# Patient Record
Sex: Male | Born: 1945 | Race: White | Hispanic: No | Marital: Married | Smoking: Former smoker
Health system: Southern US, Community
[De-identification: ages and names within clinical notes are randomized; demographics above are authoritative.]

## PROBLEM LIST (undated history)

## (undated) DIAGNOSIS — Z87448 Personal history of other diseases of urinary system: Secondary | ICD-10-CM

## (undated) DIAGNOSIS — I10 Essential (primary) hypertension: Secondary | ICD-10-CM

## (undated) DIAGNOSIS — I4819 Other persistent atrial fibrillation: Secondary | ICD-10-CM

## (undated) DIAGNOSIS — N135 Crossing vessel and stricture of ureter without hydronephrosis: Secondary | ICD-10-CM

## (undated) DIAGNOSIS — R3914 Feeling of incomplete bladder emptying: Secondary | ICD-10-CM

## (undated) DIAGNOSIS — K432 Incisional hernia without obstruction or gangrene: Secondary | ICD-10-CM

## (undated) DIAGNOSIS — R011 Cardiac murmur, unspecified: Secondary | ICD-10-CM

## (undated) DIAGNOSIS — H3530 Unspecified macular degeneration: Secondary | ICD-10-CM

## (undated) DIAGNOSIS — Z9289 Personal history of other medical treatment: Secondary | ICD-10-CM

## (undated) DIAGNOSIS — C61 Malignant neoplasm of prostate: Secondary | ICD-10-CM

## (undated) DIAGNOSIS — E7800 Pure hypercholesterolemia, unspecified: Secondary | ICD-10-CM

## (undated) DIAGNOSIS — N200 Calculus of kidney: Secondary | ICD-10-CM

## (undated) DIAGNOSIS — G4730 Sleep apnea, unspecified: Secondary | ICD-10-CM

## (undated) DIAGNOSIS — Z9989 Dependence on other enabling machines and devices: Secondary | ICD-10-CM

## (undated) DIAGNOSIS — I499 Cardiac arrhythmia, unspecified: Secondary | ICD-10-CM

## (undated) DIAGNOSIS — F32A Depression, unspecified: Secondary | ICD-10-CM

## (undated) DIAGNOSIS — R39198 Other difficulties with micturition: Secondary | ICD-10-CM

## (undated) DIAGNOSIS — I4891 Unspecified atrial fibrillation: Secondary | ICD-10-CM

## (undated) DIAGNOSIS — I2510 Atherosclerotic heart disease of native coronary artery without angina pectoris: Secondary | ICD-10-CM

## (undated) DIAGNOSIS — Z7901 Long term (current) use of anticoagulants: Secondary | ICD-10-CM

## (undated) DIAGNOSIS — R002 Palpitations: Secondary | ICD-10-CM

## (undated) DIAGNOSIS — F329 Major depressive disorder, single episode, unspecified: Secondary | ICD-10-CM

## (undated) DIAGNOSIS — R0609 Other forms of dyspnea: Secondary | ICD-10-CM

## (undated) DIAGNOSIS — K08109 Complete loss of teeth, unspecified cause, unspecified class: Secondary | ICD-10-CM

## (undated) DIAGNOSIS — C801 Malignant (primary) neoplasm, unspecified: Secondary | ICD-10-CM

## (undated) DIAGNOSIS — M1990 Unspecified osteoarthritis, unspecified site: Secondary | ICD-10-CM

## (undated) DIAGNOSIS — R0600 Dyspnea, unspecified: Secondary | ICD-10-CM

## (undated) DIAGNOSIS — E119 Type 2 diabetes mellitus without complications: Secondary | ICD-10-CM

## (undated) DIAGNOSIS — G4733 Obstructive sleep apnea (adult) (pediatric): Secondary | ICD-10-CM

## (undated) DIAGNOSIS — Z974 Presence of external hearing-aid: Secondary | ICD-10-CM

## (undated) HISTORY — DX: Obstructive sleep apnea (adult) (pediatric): Z99.89

## (undated) HISTORY — DX: Malignant neoplasm of prostate: C61

## (undated) HISTORY — PX: JOINT REPLACEMENT: SHX530

## (undated) HISTORY — PX: ROTATOR CUFF REPAIR: SHX139

## (undated) HISTORY — DX: Personal history of other medical treatment: Z92.89

## (undated) HISTORY — DX: Unspecified atrial fibrillation: I48.91

## (undated) HISTORY — PX: OTHER SURGICAL HISTORY: SHX169

## (undated) HISTORY — DX: Pure hypercholesterolemia, unspecified: E78.00

## (undated) HISTORY — DX: Unspecified osteoarthritis, unspecified site: M19.90

## (undated) HISTORY — DX: Unspecified macular degeneration: H35.30

## (undated) HISTORY — DX: Cardiac arrhythmia, unspecified: I49.9

## (undated) HISTORY — DX: Obstructive sleep apnea (adult) (pediatric): G47.33

## (undated) HISTORY — DX: Palpitations: R00.2

---

## 1996-05-15 DIAGNOSIS — Z8546 Personal history of malignant neoplasm of prostate: Secondary | ICD-10-CM

## 1996-05-15 HISTORY — PX: OTHER SURGICAL HISTORY: SHX169

## 1996-05-15 HISTORY — DX: Personal history of malignant neoplasm of prostate: Z85.46

## 2000-04-04 ENCOUNTER — Encounter: Payer: Self-pay | Admitting: Specialist

## 2000-04-04 ENCOUNTER — Ambulatory Visit (HOSPITAL_COMMUNITY): Admission: RE | Admit: 2000-04-04 | Discharge: 2000-04-04 | Payer: Self-pay | Admitting: Specialist

## 2001-01-02 ENCOUNTER — Encounter (INDEPENDENT_AMBULATORY_CARE_PROVIDER_SITE_OTHER): Payer: Self-pay | Admitting: Specialist

## 2001-01-02 ENCOUNTER — Ambulatory Visit (HOSPITAL_COMMUNITY): Admission: RE | Admit: 2001-01-02 | Discharge: 2001-01-02 | Payer: Self-pay | Admitting: Gastroenterology

## 2005-05-15 HISTORY — PX: COLONOSCOPY: SHX174

## 2005-09-26 ENCOUNTER — Emergency Department (HOSPITAL_COMMUNITY): Admission: EM | Admit: 2005-09-26 | Discharge: 2005-09-26 | Payer: Self-pay | Admitting: Emergency Medicine

## 2006-11-16 ENCOUNTER — Emergency Department (HOSPITAL_COMMUNITY): Admission: EM | Admit: 2006-11-16 | Discharge: 2006-11-16 | Payer: Self-pay | Admitting: Emergency Medicine

## 2006-12-03 ENCOUNTER — Ambulatory Visit (HOSPITAL_BASED_OUTPATIENT_CLINIC_OR_DEPARTMENT_OTHER): Admission: RE | Admit: 2006-12-03 | Discharge: 2006-12-03 | Payer: Self-pay | Admitting: Urology

## 2007-09-25 ENCOUNTER — Ambulatory Visit (HOSPITAL_BASED_OUTPATIENT_CLINIC_OR_DEPARTMENT_OTHER): Admission: RE | Admit: 2007-09-25 | Discharge: 2007-09-25 | Payer: Self-pay | Admitting: Urology

## 2008-04-30 ENCOUNTER — Encounter: Admission: RE | Admit: 2008-04-30 | Discharge: 2008-04-30 | Payer: Self-pay | Admitting: Internal Medicine

## 2008-07-16 ENCOUNTER — Encounter: Admission: RE | Admit: 2008-07-16 | Discharge: 2008-07-16 | Payer: Self-pay | Admitting: Internal Medicine

## 2008-09-16 ENCOUNTER — Ambulatory Visit (HOSPITAL_COMMUNITY): Admission: RE | Admit: 2008-09-16 | Discharge: 2008-09-16 | Payer: Self-pay | Admitting: Cardiology

## 2010-03-17 ENCOUNTER — Ambulatory Visit: Payer: Self-pay | Admitting: Cardiology

## 2010-08-23 LAB — GLUCOSE, CAPILLARY: Glucose-Capillary: 113 mg/dL — ABNORMAL HIGH (ref 70–99)

## 2010-09-19 ENCOUNTER — Encounter: Payer: Self-pay | Admitting: *Deleted

## 2010-09-23 ENCOUNTER — Encounter: Payer: Self-pay | Admitting: Cardiology

## 2010-09-23 ENCOUNTER — Ambulatory Visit (INDEPENDENT_AMBULATORY_CARE_PROVIDER_SITE_OTHER): Payer: Medicare Other | Admitting: Cardiology

## 2010-09-23 VITALS — BP 100/78 | HR 72 | Ht 72.0 in | Wt 213.6 lb

## 2010-09-23 DIAGNOSIS — C61 Malignant neoplasm of prostate: Secondary | ICD-10-CM | POA: Insufficient documentation

## 2010-09-23 DIAGNOSIS — I4891 Unspecified atrial fibrillation: Secondary | ICD-10-CM | POA: Insufficient documentation

## 2010-09-23 DIAGNOSIS — E7800 Pure hypercholesterolemia, unspecified: Secondary | ICD-10-CM | POA: Insufficient documentation

## 2010-09-23 DIAGNOSIS — R002 Palpitations: Secondary | ICD-10-CM | POA: Insufficient documentation

## 2010-09-23 NOTE — Progress Notes (Signed)
Subjective:   [name] comes in today for followup of his atrial fibrillation he is on chronic warfarin and his INR is 2.3. In general, he's been feeling well but will get occasional shortness of breath if he overexerts. We attempted cardioversion without any long-term success. Other underlying issues include hypertension and hyperlipemia. He has a history of obesity with significant weight loss. His wife died one year ago.  Current Outpatient Prescriptions  Medication Sig Dispense Refill  . ALLOPURINOL PO Take by mouth daily.        Marland Kitchen atenolol (TENORMIN) 50 MG tablet Take 25 mg by mouth daily.        Marland Kitchen atorvastatin (LIPITOR) 10 MG tablet Take 10 mg by mouth daily.        . BuPROPion HCl (WELLBUTRIN PO) Take by mouth daily.        Marland Kitchen LISINOPRIL PO Take 2.5 mg by mouth daily.       . metFORMIN (GLUCOPHAGE) 500 MG tablet Take 500 mg by mouth.        . Multiple Vitamin (MULTIVITAMIN PO) Take by mouth daily.        . TRAMADOL HCL PO Take by mouth.        . warfarin (COUMADIN) 6 MG tablet Take 6 mg by mouth daily.        Marland Kitchen DISCONTD: TRAMADOL HCL PO Take by mouth 2 (two) times daily.          Allergies  Allergen Reactions  . Sulfa Drugs Cross Reactors   . Tetracyclines & Related     Patient Active Problem List  Diagnoses  . Arrhythmia  . Prostate cancer  . Palpitations  . Diabetes mellitus  . Hypercholesteremia    History  Smoking status  . Former Smoker  . Quit date: 05/15/1973  Smokeless tobacco  . Not on file    History  Alcohol Use     Family History  Problem Relation Age of Onset  . Cancer Mother   . Heart attack Sister   . Stroke Sister     Review of Systems:   The patient denies any heat or cold intolerance.  No weight gain or weight loss.  The patient denies headaches or blurry vision.  There is no cough or sputum production.  The patient denies dizziness.  There is no hematuria or hematochezia.  The patient denies any muscle aches or arthritis.  The patient denies  any rash.  The patient denies frequent falling or instability.  There is no history of depression or anxiety.  All other systems were reviewed and are negative.   Physical Exam:   Blood pressure is 100/78. Heart rate 72. Weight is 213 pounds.The head is normocephalic and atraumatic.  Pupils are equally round and reactive to light.  Sclerae nonicteric.  Conjunctiva is clear.  Oropharynx is unremarkable.  There's adequate oral airway.  Neck is supple there are no masses.  Thyroid is not enlarged.  There is no lymphadenopathy.  Lungs are clear.  Chest is symmetric.  Heart shows a irregular rate and rhythm.  S1 and S2 are normal.  There is no murmur click or gallop.  Abdomen is soft normal bowel sounds.  There is no organomegaly.  Genital and rectal deferred.  Extremities are without edema.  Peripheral pulses are adequate.  Neurologically intact.  Full range of motion.  The patient is not depressed.  Skin is warm and dry. Assessment / Plan:

## 2010-09-23 NOTE — Assessment & Plan Note (Signed)
In general, he continues to do well. We'll continue to treat him with rate control with atenolol and anticoagulation with warfarin. He has hypertension and diabetes and needs to stay on long-term anticoagulation.

## 2010-09-27 NOTE — H&P (Signed)
NAMERICARD, FAULKNER                  ACCOUNT NO.:  [id number]   MEDICAL RECORD NO.:  [id number]          PATIENT TYPE:   LOCATION:                                 FACILITY:   PHYSICIAN:  Colleen Can. Deborah Chalk, M.D.DATE OF BIRTH:  June 29, 1945   DATE OF ADMISSION:  09/15/2008  DATE OF DISCHARGE:                              HISTORY & PHYSICAL   CHIEF COMPLAINT:  None.   HISTORY OF PRESENT ILLNESS:  [name] is a very pleasant 65 year old white  male who presents for attempts at cardioversion.  He has had atrial  fibrillation and has been maintained on chronic Coumadin  anticoagulation.  The exact duration of his arrhythmia is unknown.  He  denies symptoms or palpitations.  He does not have shortness of breath.  He denies being lightheaded or dizzy and he has not had syncope.  He has  had a stress echocardiogram which was normal.  His 2-D echocardiogram  showed mild LVH with mild biatrial enlargement.  There was mild-to-  moderate mitral and tricuspid regurgitation.  His Coumadin levels have  been therapeutic and he now presents for attempts at cardioversion.   PAST MEDICAL HISTORY:  1. Atrial fibrillation of uncertain duration.  2. Chronic Coumadin anticoagulation.  3. Hypertension x10 years.  4. Adult onset diabetes.  5. Hyperlipidemia.  6. History of prostate cancer with seed implantation.  7. Previous history of fractures.   ALLERGIES:  SULFA and TETRACYCLINE.   CURRENT MEDICATIONS:  1. Atenolol 50 mg taking a half tablet daily.  2. Metformin 500 mg a day.  3. Allopurinol daily.  4. Lisinopril daily.  5. Lipitor daily.  6. Tramadol p.r.n.  7. Coumadin taking 4 mg 5 days a week and 6 mg 2 days a week.   FAMILY HISTORY:  Father died at 15 with a heart attack and stroke.  Mother died at age 4 of cancer.  He had one sister who died at the age  of 38.   SOCIAL HISTORY:  He lives at home with his spouse.  He has two sons.  He  enjoys playing music.  He is semi-retired Music therapist.   He will have 5-6  beers on a Wednesday night which is music night, but otherwise has very  minimal alcohol intake and he stopped smoking in 1975.   REVIEW OF SYSTEMS:  Basically as noted above.  He denies having chest  pain or shortness of breath.  He has not been lightheaded or dizzy.  He  has had no complaints with energy level.  All other review of systems  are negative.   PHYSICAL EXAMINATION:  GENERAL:  Today, he is very pleasant white male  who is in no acute distress.  He is very pleasant, conversive, and  follows commands.  VITAL SIGNS:  Blood pressure is 100/70, heart rate 62 and irregular, his  weight is 210 pounds.  SKIN: Warm and dry. COLOR: Normal.  HEENT:  He is normocephalic and atraumatic.  Pupils are equal and  reactive.  Sclerae are nonicteric.  Conjunctivae are normal.  NECK:  Supple.  No masses.  No bruits.  LUNGS:  Clear to auscultate.  CARDIAC:  An irregularly irregular rhythm with a controlled rate.  ABDOMEN:  Soft, positive bowel sounds, and nontender.  EXTREMITIES:  Without edema.  MUSCULOSKELETAL:  Gait and range of motion to be intact.  His strength  is symmetric.  NEUROLOGIC:  No gross focal deficits.   PERTINENT LABORATORY DATA:  Pending.   OVERALL IMPRESSION:  1. Atrial fibrillation of uncertain duration.  2. Chronic Coumadin anticoagulation.  3. Hypertension.  4. Diabetes.  5. Hyperlipidemia.   PLAN:  We will proceed on with attempts at cardioversion.  The procedure  has been reviewed in full detail and he is willing to proceed on  Wednesday Sep 15, 2008.      Sharlee Blew, N.P.      Colleen Can. Deborah Chalk, M.D.  Electronically Signed    LC/MEDQ  D:  09/14/2008  T:  09/15/2008  Job:  [id number]

## 2010-09-27 NOTE — Cardiovascular Report (Signed)
[name], [name]                  ACCOUNT NO.:  [id number]   MEDICAL RECORD NO.:  [id number]          PATIENT TYPE:  OIB   LOCATION:  2899                         FACILITY:  [facility]   PHYSICIAN:  Colleen Can. Deborah Chalk, M.D.DATE OF BIRTH:  Dec 31, 1945   DATE OF PROCEDURE:  09/16/2008  DATE OF DISCHARGE:  09/16/2008                            CARDIAC CATHETERIZATION   PROCEDURE:  Cardioversion.   PROCEDURE:  Using anterior posterior pads, 120 joules of biphasic energy  were delivered in a synchronized fashion with conversion to normal sinus  rhythm.  The patient tolerated the procedure well.  Anesthesia was with  Dr. Autumn Patty with 150 mg of IV propofol.  The patient tolerated  the procedure well.      Colleen Can. Deborah Chalk, M.D.  Electronically Signed     SNT/MEDQ  D:  09/16/2008  T:  09/16/2008  Job:  [id number]

## 2010-09-27 NOTE — Op Note (Signed)
[name], [name]                  ACCOUNT NO.:  [id number]   MEDICAL RECORD NO.:  [id number]         PATIENT TYPE:  LINP   LOCATION:                               FACILITY:  [facility]   PHYSICIAN:  Maretta Bees. Vonita Moss, M.D.DATE OF BIRTH:  [birth year]   DATE OF PROCEDURE:  12/03/2006  DATE OF DISCHARGE:                               OPERATIVE REPORT   [facility]   PREOPERATIVE DIAGNOSIS:  Urethral stricture and slow stream.   POSTOP DIAGNOSIS.:  Urethral stricture and slow stream.   PROCEDURE:  Cystoscopy and urethral dilation with filiforms and  followers.   SURGEON:  Maretta Bees. Vonita Moss, M.D.   ANESTHESIA:  General.   INDICATIONS:  This 65 year old gentleman underwent radioactive seed  implantation over 10 years ago.  His stream has been slow lately.  This  PSAs have been very low.  A flow rate was performed that showed peak of  7 and a mean of 3 mL a second.  He did empty well on ultrasound.  He was  cystoscoped in the office and he was found to have a bulbous urethral  stricture; and he was found to have a urethral stricture.  He was  brought to the OR today for further treatment and evaluation.   PROCEDURE:  The patient was brought to the operating room, and placed in  the lithotomy position and external genitalia were prepped and draped in  the usual fashion.  He was cystoscoped; the anterior urethra was normal.  He had a tight deep bulbous urethral stricture just outside the  sphincter.  I inserted a guidewire with the aid of the cystoscope and  then dilated the stricture to 26-French with Heyman dilators.  I then  cystoscoped him and external sphincter and prostatic urethra were  unremarkable with no significant obstruction.  The bladder was  completely normal.  The bladder was emptied, the scope removed; and the  patient sent to the recovery room in good condition having tolerated the  procedure well.      Maretta Bees. Vonita Moss, M.D.  Electronically Signed     LJP/MEDQ  D:  12/03/2006  T:  12/03/2006  Job:  [id number]

## 2010-09-27 NOTE — Op Note (Signed)
[name], [name]                  ACCOUNT NO.:  [id number]   MEDICAL RECORD NO.:  [id number]          PATIENT TYPE:  AMB   LOCATION:  [facility]                         FACILITY:  [facility]   PHYSICIAN:  Maretta Bees. Vonita Moss, M.D.DATE OF BIRTH:  1946/04/22   DATE OF PROCEDURE:  09/25/2007  DATE OF DISCHARGE:                               OPERATIVE REPORT   PREOPERATIVE DIAGNOSIS:  Deep bulbous urethral stricture.   POSTOPERATIVE DIAGNOSES:  Deep bulbous urethral stricture.   PROCEDURE:  Cystoscopy and laser incision of urethral stricture.   SURGEON:  Dr. Larey Dresser   ANESTHESIA:  General.   INDICATIONS:  This gentleman had radioactive seed implantation in 1998  and has had a good curative result but he has had problems with a  urethral stricture just beyond the sphincter and he did require cysto  and urethral dilation last July.  He did better for a while but his  stream has slowed down significantly.  He was counseled about laser  incision of the stricture and postoperative self catheter which he has  learned at [facility].  He was advised about the risk of  recurrent stricture disease, infection and incontinence.   PROCEDURE:  The patient brought to the operating room, placed in  lithotomy position.  External genitalia were prepped and draped in the  usual fashion.  He was cystoscoped and a deep bulbous urethral stricture  was identified through which a glide wire passed easily.  With the  guidewire in place and using a 200 micron holmium laser fiber, the  stricture was incised at 12 o'clock and then at 6 o'clock with care to  not go beyond the strictured area and the sphincter region.  After the  incisions were made, the cystoscope passed easily into through the  stricture and through the prostatic urethra which was short and  nonobstructing.  The bladder had trabeculation but no other lesions.  At  this point the bladder was emptied, the scope and Glidewire and laser  catheter were removed.  He was sent to recovery room in good condition  having tolerated the procedure well.      Maretta Bees. Vonita Moss, M.D.  Electronically Signed     LJP/MEDQ  D:  09/25/2007  T:  09/25/2007  Job:  [id number]

## 2010-09-30 NOTE — Procedures (Signed)
[facility]  Patient:    [name], [name] Visit Number: [id number] MRN: [id number]          Service Type: Attending:  Verlin Grills, M.D. Proc. Date: 01/02/01   CC:         Wayne C. Dorna Bloom, M.D.   Procedure Report  PROCEDURE:  Colonoscopy with polypectomy.  INDICATION:  Mr. [name] (date of birth 05/23/2045) is a 65 year old male, who is due for his surveillance colonoscopy and polypectomy to prevent colon cancer.  His mother died of colon cancer when she was in her 27s.  [name] tells me he has had colon polyps removed with his last colonoscopy.  ENDOSCOPIST:  Verlin Grills, M.D.  PREMEDICATION:  Fentanyl 50 mcg, Versed 10 mg.  ENDOSCOPE:  Olympus pediatric colonoscope.  DESCRIPTION OF PROCEDURE:  After obtaining informed consent, [name] was placed in the left lateral decubitus position.  I administered intravenous fentanyl and intravenous Versed to achieve conscious sedation for the procedure.  The patients blood pressure, oxygen saturations, and cardiac rhythm were monitored throughout the procedure and documented in the medical record.  Anal inspection was normal.  Digital rectal exam revealed a nonnodular prostate.  The Olympus pediatric video colonoscope was introduced into the rectum and easily advanced to the cecum.  Colonic preparation for the exam today was excellent.  RECTUM:  Normal.  SIGMOID COLON AND DESCENDING COLON:  From the distal descending colon, at 55 cm from the anal verge, a 1 mm sessile polyp was removed with the hot biopsy forceps and submitted for pathological interpretation.  SPLENIC FLEXURE:  Normal.  TRANSVERSE COLON:  Normal.  HEPATIC FLEXURE:  Normal.  ASCENDING COLON:  Normal.  CECUM AND ILEOCECAL VALVE:  Normal.  ASSESSMENT:  A 1 mm sessile polyps was removed with the hot biopsy forceps from the distal descending colon at 55 cm from the anal verge and submitted for pathological  interpretation.  Otherwise, normal proctocolonoscopy to the cecum.  RECOMMENDATIONS:  Repeat colonoscopy in approximately five years. Attending:  Verlin Grills, M.D. DD:  01/02/01 TD:  01/02/01 Job: [id number]

## 2011-02-27 LAB — BASIC METABOLIC PANEL
BUN: 20
CO2: 29
Calcium: 9.2
Chloride: 103
Creatinine, Ser: 1.28
GFR calc Af Amer: >60
GFR calc non Af Amer: 57 — ABNORMAL LOW
Glucose, Bld: 84
Potassium: 4.2
Sodium: 140

## 2011-02-27 LAB — POCT HEMOGLOBIN-HEMACUE
Hemoglobin: 14.2
Operator id: 268271

## 2011-02-28 LAB — PROTIME-INR
INR: 1
Prothrombin Time: 13.2

## 2011-02-28 LAB — POCT CARDIAC MARKERS
CKMB, poc: 1.9
CKMB, poc: 2.7
Myoglobin, poc: 104
Myoglobin, poc: 120
Operator id: 1211
Operator id: 4661
Troponin i, poc: <0.05
Troponin i, poc: <0.05

## 2011-02-28 LAB — BASIC METABOLIC PANEL
BUN: 22
CO2: 24
Calcium: 8.9
Chloride: 98
Creatinine, Ser: 1.08
GFR calc Af Amer: >60
GFR calc non Af Amer: >60
Glucose, Bld: 123 — ABNORMAL HIGH
Potassium: 3.2 — ABNORMAL LOW
Sodium: 133 — ABNORMAL LOW

## 2011-04-03 ENCOUNTER — Ambulatory Visit (INDEPENDENT_AMBULATORY_CARE_PROVIDER_SITE_OTHER): Payer: Medicare Other | Admitting: Cardiology

## 2011-04-03 ENCOUNTER — Encounter: Payer: Self-pay | Admitting: Cardiology

## 2011-04-03 VITALS — BP 134/84 | HR 60

## 2011-04-03 DIAGNOSIS — E7800 Pure hypercholesterolemia, unspecified: Secondary | ICD-10-CM

## 2011-04-03 DIAGNOSIS — I4891 Unspecified atrial fibrillation: Secondary | ICD-10-CM

## 2011-04-03 NOTE — Patient Instructions (Addendum)
Your physician recommends that you have  lab work on Friday November 23,2012--CBC/BMET.  The lab is open from  8:30am-4:30pm , closed 1:30pm-2:30pm for lunch.  Your physician wants you to follow-up in: 6 months with Dr Shirlee Latch. (May 2013). You will receive a reminder letter in the mail two months in advance. If you don't receive a letter, please call our office to schedule the follow-up appointment.

## 2011-04-04 NOTE — Assessment & Plan Note (Signed)
I will call his PCP's office to get most recent lipids.

## 2011-04-04 NOTE — Progress Notes (Signed)
PCP: Dr. Eloise Harman  65 yo with history of chronic atrial fibrillation that has recurred after multiple cardioversions presents for cardiology followup.  He has been seen by Dr. Deborah Chalk in the past and is seen by me for the first time today.  He has been on warfarin long-term but asks about alternative agents as he sometimes forgets to get his INR checked.  He does well symptomatically and is still working as a Nutritional therapist.  No chest pain.  Mild shortness of breath when carrying a heavy load.  He can climb ladders and stairs without problems.  No history of GI bleeding.    ECG: atrial fibrillation, PVCs, anteroseptal Qs  PMH: 1. Chronic atrial fibrillation: Recurred repetitively after DCCV. He is on warfarin.  2. Gout 3. OSA on CPAP. 4. HTN 5. Hyperlipidemia 6. Obesity 7. H/o prostate cancer  SH: Widower.  Quit tobacco in 1975.  Works as Surveyor, minerals for remodeling jobs.    FH: Sister with MI and CVA.  Father with MI at 76.   ROS: All systems reviewed and negative except as per HPI.   Current Outpatient Prescriptions  Medication Sig Dispense Refill  . ALLOPURINOL PO Take by mouth daily.        Marland Kitchen atenolol (TENORMIN) 50 MG tablet Take 25 mg by mouth daily.        Marland Kitchen atorvastatin (LIPITOR) 10 MG tablet Take 10 mg by mouth daily.        . BuPROPion HCl (WELLBUTRIN PO) Take by mouth daily.        Marland Kitchen LISINOPRIL PO Take 2.5 mg by mouth daily.       . metFORMIN (GLUCOPHAGE) 500 MG tablet Take 500 mg by mouth.        . Multiple Vitamin (MULTIVITAMIN PO) Take by mouth daily.        . TRAMADOL HCL PO Take by mouth.        . warfarin (COUMADIN) 6 MG tablet Take 6 mg by mouth daily.          BP 134/84  Pulse 60 General: NAD Neck: No JVD, no thyromegaly or thyroid nodule.  Lungs: Clear to auscultation bilaterally with normal respiratory effort. CV: Nondisplaced PMI.  Heart irregular S1/S2, no S3/S4, no murmur.  No peripheral edema.  No carotid bruit.  Normal pedal pulses.  Abdomen:  Soft, nontender, no hepatosplenomegaly, no distention.  Neurologic: Alert and oriented x 3.  Psych: Normal affect. Extremities: No clubbing or cyanosis.

## 2011-04-04 NOTE — Assessment & Plan Note (Addendum)
Chronic atrial fibrillation.  Good rate control on atenolol.  He is minimally symptomatic and atrial fibrillation has recurred after each cardioversion, so I do not think that it would be worthwhile to try to cardiovert him again.  He sometimes forgets to get his INR drawn and was interested in an alternative agent to coumadin.  I will check a BMET and CBC today.  If creatinine is ok, I will start him on once daily Xarelto.  As long as INR is </= 2.5, he can stop coumadin and start Xarelto the next day.

## 2011-04-07 ENCOUNTER — Other Ambulatory Visit (INDEPENDENT_AMBULATORY_CARE_PROVIDER_SITE_OTHER): Payer: Medicare Other | Admitting: *Deleted

## 2011-04-07 DIAGNOSIS — I4891 Unspecified atrial fibrillation: Secondary | ICD-10-CM

## 2011-04-07 LAB — CBC WITH DIFFERENTIAL/PLATELET
Basophils Absolute: 0 10*3/uL (ref 0.0–0.1)
Basophils Relative: 0.3 % (ref 0.0–3.0)
Eosinophils Absolute: 0.1 10*3/uL (ref 0.0–0.7)
Eosinophils Relative: 2 % (ref 0.0–5.0)
HCT: 42 % (ref 39.0–52.0)
Hemoglobin: 14.4 g/dL (ref 13.0–17.0)
Lymphocytes Relative: 23.7 % (ref 12.0–46.0)
Lymphs Abs: 1.3 10*3/uL (ref 0.7–4.0)
MCHC: 34.3 g/dL (ref 30.0–36.0)
MCV: 92.9 fl (ref 78.0–100.0)
Monocytes Absolute: 0.4 10*3/uL (ref 0.1–1.0)
Monocytes Relative: 7.9 % (ref 3.0–12.0)
Neutro Abs: 3.5 10*3/uL (ref 1.4–7.7)
Neutrophils Relative %: 66.1 % (ref 43.0–77.0)
Platelets: 127 10*3/uL — ABNORMAL LOW (ref 150.0–400.0)
RBC: 4.52 Mil/uL (ref 4.22–5.81)
RDW: 13.3 % (ref 11.5–14.6)
WBC: 5.3 10*3/uL (ref 4.5–10.5)

## 2011-04-07 LAB — BASIC METABOLIC PANEL
BUN: 21 mg/dL (ref 6–23)
CO2: 27 mEq/L (ref 19–32)
Calcium: 9 mg/dL (ref 8.4–10.5)
Chloride: 105 mEq/L (ref 96–112)
Creatinine, Ser: 1.4 mg/dL (ref 0.4–1.5)
GFR: 55.51 mL/min — ABNORMAL LOW (ref >60.00)
Glucose, Bld: 127 mg/dL — ABNORMAL HIGH (ref 70–99)
Potassium: 3.9 mEq/L (ref 3.5–5.1)
Sodium: 140 mEq/L (ref 135–145)

## 2011-04-10 ENCOUNTER — Telehealth: Payer: Self-pay | Admitting: *Deleted

## 2011-04-10 DIAGNOSIS — I4891 Unspecified atrial fibrillation: Secondary | ICD-10-CM

## 2011-04-10 MED ORDER — RIVAROXABAN 20 MG PO TABS: 20.0000 mg | ORAL_TABLET | Freq: Every day | ORAL | Status: DC

## 2011-04-10 NOTE — Telephone Encounter (Signed)
Notes Recorded by Jacqlyn Krauss, RN on 04/10/2011 at 3:15 PM I talked with pt. Pt states last INR 04/07/11 was 2.2. Pt will stop coumadin today and start Xarelto 20mg  daily tomorrow. Pt is aware he needs to return for a BMET and CBC 1 month after starting Xarelto. ------  Notes Recorded by Marca Ancona, MD on 04/08/2011 at 9:37 AM If INR < 3, can stop coumadin and start Xarelto 20 mg daily the next day. Needs to get BMET and CBC in 1 month.

## 2011-04-10 NOTE — Telephone Encounter (Signed)
Addended by: Jacqlyn Krauss on: 04/10/2011 03:57 PM   Modules accepted: Orders

## 2011-04-10 NOTE — Telephone Encounter (Signed)
Pt has decided not to stop coumadin and start Xarelto at the present time. (His co-pay will be $45 ).

## 2011-08-01 ENCOUNTER — Encounter (INDEPENDENT_AMBULATORY_CARE_PROVIDER_SITE_OTHER): Payer: Medicare Other | Admitting: Ophthalmology

## 2011-08-01 DIAGNOSIS — H3530 Unspecified macular degeneration: Secondary | ICD-10-CM

## 2011-08-01 DIAGNOSIS — E1139 Type 2 diabetes mellitus with other diabetic ophthalmic complication: Secondary | ICD-10-CM

## 2011-08-01 DIAGNOSIS — H2510 Age-related nuclear cataract, unspecified eye: Secondary | ICD-10-CM

## 2011-08-01 DIAGNOSIS — E11319 Type 2 diabetes mellitus with unspecified diabetic retinopathy without macular edema: Secondary | ICD-10-CM

## 2011-08-01 DIAGNOSIS — H43819 Vitreous degeneration, unspecified eye: Secondary | ICD-10-CM

## 2011-10-02 ENCOUNTER — Ambulatory Visit (INDEPENDENT_AMBULATORY_CARE_PROVIDER_SITE_OTHER): Payer: Medicare Other | Admitting: Cardiology

## 2011-10-02 ENCOUNTER — Encounter: Payer: Self-pay | Admitting: Cardiology

## 2011-10-02 VITALS — BP 110/80 | HR 52 | Resp 18 | Ht 71.0 in | Wt 225.1 lb

## 2011-10-02 DIAGNOSIS — I4891 Unspecified atrial fibrillation: Secondary | ICD-10-CM

## 2011-10-02 DIAGNOSIS — R0600 Dyspnea, unspecified: Secondary | ICD-10-CM

## 2011-10-02 DIAGNOSIS — E7800 Pure hypercholesterolemia, unspecified: Secondary | ICD-10-CM

## 2011-10-02 NOTE — Assessment & Plan Note (Signed)
Lipids done by PCP.  He will ask that a copy be faxed here when he gets lipids again.

## 2011-10-02 NOTE — Progress Notes (Signed)
PCP: Dr. Eloise Harman  66 yo with history of chronic atrial fibrillation that has recurred after multiple cardioversions presents for cardiology followup.  At last appointment, we talked about switching to Xarelto to avoid INR checks.  I gave him a prescription but he discovered that it was more cost effective for him to stay on warfarin so he remains on warfarin.  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  For the last 5-6 months, he has noted new exertional dyspnea when walking up a flight of steps.  He will feel his heart pounding with some pressure at the top of the steps.  No overt chest pain.  He does not have problems walking on flat ground.  He remains in atrial fibrillation with HR in the 50s today.   ECG: atrial fibrillation, anteroseptal Qs (old)  PMH: 1. Chronic atrial fibrillation: Recurred repetitively after DCCV. He is on warfarin.  2. Gout 3. OSA on CPAP. 4. HTN 5. Hyperlipidemia 6. Obesity 7. H/o prostate cancer  SH: Widower.  Quit tobacco in 1975.  Works as Surveyor, minerals for remodeling jobs.    FH: Sister with MI and CVA.  Father with MI at 76.   ROS: All systems reviewed and negative except as per HPI.   Current Outpatient Prescriptions  Medication Sig Dispense Refill  . ALLOPURINOL PO Take by mouth daily.        Marland Kitchen atenolol (TENORMIN) 50 MG tablet Take 25 mg by mouth daily.        Marland Kitchen atorvastatin (LIPITOR) 10 MG tablet Take 10 mg by mouth daily.        . BuPROPion HCl (WELLBUTRIN PO) Take by mouth daily.        Marland Kitchen LISINOPRIL PO Take 2.5 mg by mouth daily.       . metFORMIN (GLUCOPHAGE) 500 MG tablet Take 500 mg by mouth.        . Multiple Vitamin (MULTIVITAMIN PO) Take by mouth daily.        . TRAMADOL HCL PO Take by mouth.        . warfarin (COUMADIN) 6 MG tablet Take 1 tablet (6 mg total) by mouth daily.        BP 110/80  Pulse 52  Resp 18  Ht 5\' 11"  (1.803 m)  Wt 225 lb 1.9 oz (102.114 kg)  BMI 31.40 kg/m2 General: NAD Neck: No JVD, no thyromegaly  or thyroid nodule.  Lungs: Clear to auscultation bilaterally with normal respiratory effort. CV: Nondisplaced PMI.  Heart irregular S1/S2, no S3/S4, no murmur.  No peripheral edema.  No carotid bruit.  Normal pedal pulses.  Abdomen: Soft, nontender, no hepatosplenomegaly, no distention.  Neurologic: Alert and oriented x 3.  Psych: Normal affect. Extremities: No clubbing or cyanosis.

## 2011-10-02 NOTE — Assessment & Plan Note (Signed)
Stable on warfarin and atenolol.  Will continue this regimen.  More cost effective for him to remain on warfarin than to take one of the newer anticoagulants .

## 2011-10-02 NOTE — Patient Instructions (Signed)
Your physician has requested that you have a lexiscan myoview. For further information please visit [email or web address] Please follow instruction sheet, as given.  Your physician has requested that you have an echocardiogram. Echocardiography is a painless test that uses sound waves to create images of your heart. It provides your doctor with information about the size and shape of your heart and how well your heart's chambers and valves are working. This procedure takes approximately one hour. There are no restrictions for this procedure.   Your physician wants you to follow-up in: 6 months with Dr. Shirlee Latch.  You will receive a reminder letter in the mail two months in advance. If you don't receive a letter, please call our office to schedule the follow-up appointment.

## 2011-10-02 NOTE — Assessment & Plan Note (Addendum)
New exertional dyspnea with some palpitations and pressure in his chest when walking up a flight of steps.  He has risk factors for CAD (hyperlipidemia, family history, HTN).  He is not volume overloaded on exam.  I will get a Lexiscan myoview (knee problems walking on inclined treadmill) and an echocardiogram to evaluate.  If studies are normal, would like him to increase exercise.

## 2011-10-03 ENCOUNTER — Ambulatory Visit (HOSPITAL_COMMUNITY): Payer: Medicare Other | Attending: Cardiology

## 2011-10-03 ENCOUNTER — Other Ambulatory Visit: Payer: Self-pay

## 2011-10-03 DIAGNOSIS — I079 Rheumatic tricuspid valve disease, unspecified: Secondary | ICD-10-CM | POA: Insufficient documentation

## 2011-10-03 DIAGNOSIS — R0600 Dyspnea, unspecified: Secondary | ICD-10-CM

## 2011-10-03 DIAGNOSIS — R0602 Shortness of breath: Secondary | ICD-10-CM

## 2011-10-03 DIAGNOSIS — I379 Nonrheumatic pulmonary valve disorder, unspecified: Secondary | ICD-10-CM | POA: Insufficient documentation

## 2011-10-03 DIAGNOSIS — I10 Essential (primary) hypertension: Secondary | ICD-10-CM | POA: Insufficient documentation

## 2011-10-03 DIAGNOSIS — R0609 Other forms of dyspnea: Secondary | ICD-10-CM | POA: Insufficient documentation

## 2011-10-03 DIAGNOSIS — R0989 Other specified symptoms and signs involving the circulatory and respiratory systems: Secondary | ICD-10-CM | POA: Insufficient documentation

## 2011-10-03 DIAGNOSIS — I080 Rheumatic disorders of both mitral and aortic valves: Secondary | ICD-10-CM | POA: Insufficient documentation

## 2011-10-03 DIAGNOSIS — E785 Hyperlipidemia, unspecified: Secondary | ICD-10-CM | POA: Insufficient documentation

## 2011-10-11 ENCOUNTER — Ambulatory Visit (HOSPITAL_COMMUNITY): Payer: Medicare Other | Attending: Cardiology | Admitting: Radiology

## 2011-10-11 VITALS — BP 125/87 | HR 65 | Ht 72.0 in | Wt 220.0 lb

## 2011-10-11 DIAGNOSIS — R0989 Other specified symptoms and signs involving the circulatory and respiratory systems: Secondary | ICD-10-CM | POA: Insufficient documentation

## 2011-10-11 DIAGNOSIS — I4891 Unspecified atrial fibrillation: Secondary | ICD-10-CM

## 2011-10-11 DIAGNOSIS — R002 Palpitations: Secondary | ICD-10-CM | POA: Insufficient documentation

## 2011-10-11 DIAGNOSIS — R0600 Dyspnea, unspecified: Secondary | ICD-10-CM

## 2011-10-11 DIAGNOSIS — Z87891 Personal history of nicotine dependence: Secondary | ICD-10-CM | POA: Insufficient documentation

## 2011-10-11 DIAGNOSIS — R5381 Other malaise: Secondary | ICD-10-CM | POA: Insufficient documentation

## 2011-10-11 DIAGNOSIS — E785 Hyperlipidemia, unspecified: Secondary | ICD-10-CM | POA: Insufficient documentation

## 2011-10-11 DIAGNOSIS — R5383 Other fatigue: Secondary | ICD-10-CM | POA: Insufficient documentation

## 2011-10-11 DIAGNOSIS — E119 Type 2 diabetes mellitus without complications: Secondary | ICD-10-CM | POA: Insufficient documentation

## 2011-10-11 DIAGNOSIS — Z8249 Family history of ischemic heart disease and other diseases of the circulatory system: Secondary | ICD-10-CM | POA: Insufficient documentation

## 2011-10-11 DIAGNOSIS — R0609 Other forms of dyspnea: Secondary | ICD-10-CM | POA: Insufficient documentation

## 2011-10-11 DIAGNOSIS — I10 Essential (primary) hypertension: Secondary | ICD-10-CM | POA: Insufficient documentation

## 2011-10-11 DIAGNOSIS — R51 Headache: Secondary | ICD-10-CM | POA: Insufficient documentation

## 2011-10-11 DIAGNOSIS — R0602 Shortness of breath: Secondary | ICD-10-CM

## 2011-10-11 MED ORDER — REGADENOSON 0.4 MG/5ML IV SOLN
0.4000 mg | Freq: Once | INTRAVENOUS | Status: AC
  Administered 2011-10-11: 0.4 mg via INTRAVENOUS

## 2011-10-11 MED ORDER — TECHNETIUM TC 99M TETROFOSMIN IV KIT
33.0000 | PACK | Freq: Once | INTRAVENOUS | Status: AC | PRN
  Administered 2011-10-11: 33 via INTRAVENOUS

## 2011-10-11 MED ORDER — TECHNETIUM TC 99M TETROFOSMIN IV KIT
11.0000 | PACK | Freq: Once | INTRAVENOUS | Status: AC | PRN
  Administered 2011-10-11: 11 via INTRAVENOUS

## 2011-10-11 NOTE — Progress Notes (Addendum)
[facility] SITE 3 NUCLEAR MED [address] [place] [address] [phone]  Cardiology Nuclear Med Study  [name] is a 66 y.o. male     MRN : [id number]     DOB: 05/06/1946  Procedure Date: 10/11/2011  Nuclear Med Background Indication for Stress Test:  Evaluation for Ischemia History:  4/10 OZH:[id number], EF=69%; 5/10 Cardioversion; 10/03/11 Echo:EF=55-60%; Chronic Atrial Fibrillation Cardiac Risk Factors: Family History - CAD, History of Smoking, Hypertension, Lipids and NIDDM  Symptoms:  DOE, Fatigue, Light-Headedness and Palpitations   Nuclear Pre-Procedure Caffeine/Decaff Intake:  None> 12 hrs NPO After: 6:30pm   Lungs:  Clear. O2 Sat: 98% on room air. IV 0.9% NS with Angio Cath:  20g  IV Site: R Antecubital x 1, tolerated well IV Started by:  Irean Hong, RN  Chest Size (in):  44 Cup Size: n/a  Height: 6' (1.829 m)  Weight:  220 lb (99.791 kg)  BMI:  Body mass index is 29.84 kg/(m^2). Tech Comments:  Atenolol held x 24 hours    Nuclear Med Study 1 or 2 day study: 1 day  Stress Test Type:  Treadmill/Lexiscan  Reading MD: Marca Ancona, MD  Order Authorizing Provider:  Marca Ancona, MD  Resting Radionuclide: Technetium 19m Tetrofosmin  Resting Radionuclide Dose: 11.0 mCi   Stress Radionuclide:  Technetium 30m Tetrofosmin  Stress Radionuclide Dose: 33.0 mCi           Stress Protocol Rest HR: 65 Stress HR: 105  Rest BP: 125/87 Stress BP: 198/82  Exercise Time (min): 2:00 METS: n/a   Predicted Max HR: 153 bpm % Max HR: 68.63 bpm Rate Pressure Product: 46962   Dose of Adenosine (mg):  n/a Dose of Lexiscan: 0.4 mg  Dose of Atropine (mg): n/a Dose of Dobutamine: n/a mcg/kg/min (at max HR)  Stress Test Technologist: Smiley Houseman, CMA-N  Nuclear Technologist:  Domenic Polite, CNMT     Rest Procedure:  Myocardial perfusion imaging was performed at rest 45 minutes following the intravenous administration of Technetium 59m Tetrofosmin.  Rest  ECG: Atrial Fibrilliation with >2-second pauses and occasional PVC's.  Stress Procedure:  The patient received IV Lexiscan 0.4 mg over 15-seconds with concurrent low level exercise and then Technetium 66m Tetrofosmin was injected at 30-seconds while the patient continued walking one more minute. There were no significant changes with Lexiscan bolus, occasional PVC's were noted. Quantitative spect images were obtained after a 45-minute delay.  Stress ECG: No significant change from baseline ECG  QPS Raw Data Images:  Normal; no motion artifact; normal heart/lung ratio. Stress Images:  Normal homogeneous uptake in all areas of the myocardium. Rest Images:  Normal homogeneous uptake in all areas of the myocardium. Subtraction (SDS):  There is no evidence of scar or ischemia. Transient Ischemic Dilatation (Normal <1.22):  0.98 Lung/Heart Ratio (Normal <0.45):  0.37  Quantitative Gated Spect Images QGS EDV:  145 ml QGS ESV:  62 ml  Impression Exercise Capacity:  Lexiscan with low level exercise. BP Response:  Hypertensive blood pressure response. Clinical Symptoms:  Lightheaded.  ECG Impression:  Atrial fibrillation, no change with infusion.  Comparison with Prior Nuclear Study: No significant change from previous study  Overall Impression:  Normal stress nuclear study.  LV Ejection Fraction: 57%.  LV Wall Motion:  NL LV Function; NL Wall Motion  [name]  Normal study  Marca Ancona 10/12/2011 11:09 AM

## 2011-10-12 ENCOUNTER — Telehealth: Payer: Self-pay | Admitting: *Deleted

## 2011-10-12 NOTE — Progress Notes (Signed)
Pt.notified

## 2011-10-12 NOTE — Telephone Encounter (Signed)
Discussed results of echo done 10/03/11 with pt by telephone.

## 2011-10-12 NOTE — Progress Notes (Signed)
Also discussed results of echo done 10/03/11 with pt.

## 2011-10-17 NOTE — Progress Notes (Signed)
Addended by: Reine Just on: 10/17/2011 07:38 PM   Modules accepted: Orders

## 2012-02-28 ENCOUNTER — Encounter: Payer: Self-pay | Admitting: Physician Assistant

## 2012-02-28 ENCOUNTER — Other Ambulatory Visit: Payer: Self-pay | Admitting: Urology

## 2012-02-28 ENCOUNTER — Ambulatory Visit (INDEPENDENT_AMBULATORY_CARE_PROVIDER_SITE_OTHER): Payer: Medicare Other | Admitting: Physician Assistant

## 2012-02-28 VITALS — BP 120/86 | HR 67 | Ht 72.0 in | Wt 225.0 lb

## 2012-02-28 DIAGNOSIS — R002 Palpitations: Secondary | ICD-10-CM

## 2012-02-28 DIAGNOSIS — I4891 Unspecified atrial fibrillation: Secondary | ICD-10-CM

## 2012-02-28 DIAGNOSIS — R0609 Other forms of dyspnea: Secondary | ICD-10-CM

## 2012-02-28 DIAGNOSIS — R0600 Dyspnea, unspecified: Secondary | ICD-10-CM

## 2012-02-28 DIAGNOSIS — R0989 Other specified symptoms and signs involving the circulatory and respiratory systems: Secondary | ICD-10-CM

## 2012-02-28 DIAGNOSIS — Z01818 Encounter for other preprocedural examination: Secondary | ICD-10-CM

## 2012-02-28 NOTE — Assessment & Plan Note (Signed)
Patient had a normal stress Myoview and 2-D echo in May 2013. He is no longer having symptoms of exertional dyspnea.

## 2012-02-28 NOTE — Assessment & Plan Note (Signed)
Patient had a normal lexiscan and 2Decho in May. Normal EF., Chronic Atrial Fibrillation on Coumadin. I have discussed this patient in detail with Dr. Shirlee Latch who feels this patient can proceed with cystoscopy with biopsy without any further cardiac workup. He can come off his Coumadin for 5 days prior to the procedure without a Lovenox bridge and then restart the Coumadin after the biopsy.

## 2012-02-28 NOTE — Patient Instructions (Addendum)
Will call after discussing with Dr Shirlee Latch about holding Coumadin prior to your surgery  Cleared for surgery and okay to hold Coumadin 5 days prior  Will fax over note

## 2012-02-28 NOTE — Assessment & Plan Note (Signed)
Chronic atrial fibrillation with controlled ventricular rate. On Coumadin.

## 2012-02-28 NOTE — Progress Notes (Signed)
HPI:   This is a 66 year old white patient who is here for presurgical clearance before undergoing cystoscopy with biopsy by Dr. Natalia Leatherwood.  The patient has a history of chronic atrial fibrillation that has recurred after multiple cardioversions. He is maintained on Coumadin. In May the patient was having trouble with exertional dyspnea and heart pounding. He underwent 2-D echo which was normal and a stress Myoview which was normal. He denies any problems with exertional dyspnea. He occasionally says his heart will pound if he overexerts himself but this is rare.   Allergies: -- Sulfa Drugs Cross Reactors   -- Tetracyclines & Related   Current Outpatient Prescriptions on File Prior to Visit: ALLOPURINOL PO, Take by mouth daily.  , Disp: , Rfl:  atenolol (TENORMIN) 50 MG tablet, Take 25 mg by mouth daily.  , Disp: , Rfl:  atorvastatin (LIPITOR) 10 MG tablet, Take 10 mg by mouth daily.  , Disp: , Rfl:  BuPROPion HCl (WELLBUTRIN PO), Take by mouth daily.  , Disp: , Rfl:  LISINOPRIL PO, Take 2.5 mg by mouth daily. , Disp: , Rfl:  metFORMIN (GLUCOPHAGE) 500 MG tablet, Take 500 mg by mouth.  , Disp: , Rfl:   Multiple Vitamin (MULTIVITAMIN PO), Take by mouth daily.  , Disp: , Rfl:  TRAMADOL HCL PO, Take by mouth.  , Disp: , Rfl:  warfarin (COUMADIN) 6 MG tablet, Take 1 tablet (6 mg total) by mouth daily., Disp: , Rfl:     Past Medical History:   Palpitations                                                 Arrhythmia                                                   Prostate cancer                                                Comment:seed implantion   Diabetes mellitus                                            Hypercholesteremia                                          No past surgical history on file.  Review of patient's family history indicates:   Cancer                         Mother                   Heart attack                   Sister                   Stroke  Sister                   Social History   Marital Status: Married             Spouse Name:                      Years of Education:                 Number of children:             Occupational History   None on file  Social History Main Topics   Smoking Status: Former Smoker                   Packs/Day:       Years:           Quit date: 05/15/1973   Smokeless Status: Not on file                      Alcohol Use:                Drug Use:                Sexual Activity:                    Other Topics            Concern   None on file  Social History Narrative   None on file    ROS: See HPI Eyes: Negative Ears:Negative for hearing loss, tinnitus Cardiovascular: Negative for chest pain,  dyspnea, dyspnea on exertion, near-syncope, orthopnea, paroxysmal nocturnal dyspnea and syncope,edema, claudication, cyanosis,.  Respiratory:   Negative for cough, hemoptysis, shortness of breath, sleep disturbances due to breathing, sputum production and wheezing.   Endocrine: Negative for cold intolerance and heat intolerance.  Hematologic/Lymphatic: Negative for adenopathy and bleeding problem. Does not bruise/bleed easily.  Musculoskeletal: Negative.   Gastrointestinal: Negative for nausea, vomiting, reflux, abdominal pain, diarrhea, constipation.   Neurological: Negative.  Allergic/Immunologic: Negative for environmental allergies.  PHYSICAL EXAM: Well-nournished, in no acute distress. Neck: No JVD, HJR, Bruit, or thyroid enlargement  Lungs: No tachypnea, clear without wheezing, rales, or rhonchi  Cardiovascular: irregular irregular, PMI not displaced, heart sounds normal, no murmurs, gallops, bruit, thrill, or heave.  Abdomen: BS normal. Soft without organomegaly, masses, lesions or tenderness.  Extremities: without cyanosis, clubbing or edema. Good distal pulses bilateral  SKin: Warm, no lesions or rashes   Musculoskeletal: No deformities  Neuro: no focal  signs  BP 120/86  Pulse 67  Ht 6' (1.829 m)  Wt 225 lb (102.059 kg)  BMI 30.52 kg/m2   ZOX:[id number] fibrillation with old anteroseptal infarct. No change.   2Decho 10/03/11: Study Conclusions  - Left ventricle: The cavity size was normal. There was mild   focal basal hypertrophy of the septum. Systolic function   was normal. The estimated ejection fraction was in the   range of 55% to 60%. Wall motion was normal; there were no   regional wall motion abnormalities. The study is not   technically sufficient to allow evaluation of LV diastolic   function. - Aortic valve: Trivial regurgitation. - Mitral valve: Moderate regurgitation. - Left atrium: The atrium was moderately dilated. - Right ventricle: The cavity size was mildly dilated. - Right atrium: The atrium was mildly dilated. - Pulmonary arteries: Systolic pressure was mildly   increased. PA peak pressure:  32mm Hg (S).  10/11/11 Overall Impression:  Normal stress nuclear study.  LV Ejection Fraction: 57%.  LV Wall Motion:  NL LV Function; NL Wall Motion

## 2012-02-28 NOTE — Assessment & Plan Note (Signed)
The patient only develops palpitations if he overexerts himself.

## 2012-03-21 ENCOUNTER — Encounter (HOSPITAL_COMMUNITY): Payer: Self-pay

## 2012-03-25 ENCOUNTER — Ambulatory Visit (HOSPITAL_COMMUNITY)
Admission: RE | Admit: 2012-03-25 | Discharge: 2012-03-25 | Disposition: A | Discharge status: Home / Self Care | Payer: Medicare Other | Source: Ambulatory Visit | Attending: Urology | Admitting: Urology

## 2012-03-25 ENCOUNTER — Encounter (HOSPITAL_COMMUNITY): Payer: Self-pay

## 2012-03-25 ENCOUNTER — Encounter (HOSPITAL_COMMUNITY)
Admission: RE | Admit: 2012-03-25 | Discharge: 2012-03-25 | Disposition: A | Discharge status: Home / Self Care | Payer: Medicare Other | Source: Ambulatory Visit | Attending: Urology | Admitting: Urology

## 2012-03-25 DIAGNOSIS — Z882 Allergy status to sulfonamides status: Secondary | ICD-10-CM | POA: Insufficient documentation

## 2012-03-25 DIAGNOSIS — Z01812 Encounter for preprocedural laboratory examination: Secondary | ICD-10-CM | POA: Insufficient documentation

## 2012-03-25 DIAGNOSIS — I517 Cardiomegaly: Secondary | ICD-10-CM | POA: Insufficient documentation

## 2012-03-25 DIAGNOSIS — Z01810 Encounter for preprocedural cardiovascular examination: Secondary | ICD-10-CM | POA: Insufficient documentation

## 2012-03-25 DIAGNOSIS — Z7901 Long term (current) use of anticoagulants: Secondary | ICD-10-CM | POA: Insufficient documentation

## 2012-03-25 DIAGNOSIS — I4891 Unspecified atrial fibrillation: Secondary | ICD-10-CM | POA: Insufficient documentation

## 2012-03-25 HISTORY — DX: Essential (primary) hypertension: I10

## 2012-03-25 HISTORY — DX: Sleep apnea, unspecified: G47.30

## 2012-03-25 HISTORY — DX: Depression, unspecified: F32.A

## 2012-03-25 HISTORY — DX: Major depressive disorder, single episode, unspecified: F32.9

## 2012-03-25 LAB — BASIC METABOLIC PANEL
BUN: 23 mg/dL (ref 6–23)
CO2: 30 mEq/L (ref 19–32)
Calcium: 9.1 mg/dL (ref 8.4–10.5)
Chloride: 99 mEq/L (ref 96–112)
Creatinine, Ser: 1.34 mg/dL (ref 0.50–1.35)
GFR calc Af Amer: 61 mL/min — ABNORMAL LOW (ref >90)
GFR calc non Af Amer: 53 mL/min — ABNORMAL LOW (ref >90)
Glucose, Bld: 98 mg/dL (ref 70–99)
Potassium: 4.2 mEq/L (ref 3.5–5.1)
Sodium: 136 mEq/L (ref 135–145)

## 2012-03-25 LAB — CBC
HCT: 44 % (ref 39.0–52.0)
Hemoglobin: 15.6 g/dL (ref 13.0–17.0)
MCH: 31.8 pg (ref 26.0–34.0)
MCHC: 35.5 g/dL (ref 30.0–36.0)
MCV: 89.6 fL (ref 78.0–100.0)
Platelets: 153 10*3/uL (ref 150–400)
RBC: 4.91 MIL/uL (ref 4.22–5.81)
RDW: 12.8 % (ref 11.5–15.5)
WBC: 6.3 10*3/uL (ref 4.0–10.5)

## 2012-03-25 LAB — SURGICAL PCR SCREEN
MRSA, PCR: NEGATIVE
Staphylococcus aureus: POSITIVE — AB

## 2012-03-25 LAB — PROTIME-INR
INR: 2.05 — ABNORMAL HIGH (ref 0.00–1.49)
Prothrombin Time: 22.3 seconds — ABNORMAL HIGH (ref 11.6–15.2)

## 2012-03-25 LAB — APTT: aPTT: 41 seconds — ABNORMAL HIGH (ref 24–37)

## 2012-03-25 NOTE — Patient Instructions (Signed)
20      Your procedure is scheduled on:  Wednesday 03/27/2012 at 0945 am  [facility] at  0715 AM.  Call this number if you have problems the morning of surgery: [phone]   Remember:   Do not eat food or drink liquids after midnight!  Take these medicines the morning of surgery with A SIP OF WATER: Atenolol, Wellbutrin   Do not bring valuables to the hospital.  .  Leave suitcase in the car. After surgery it may be brought to your room.  For patients admitted to the hospital, checkout time is 11:00 AM the day of              Discharge.    Special Instructions: See [facility] Preparing  For Surgery Instruction Sheet.   Do not wear jewelry, lotions powders, perfumes. Women do not shave  legs or underarms for 12 hours before showers. Contacts, partial plates, or dentures may not be worn into surgery.                          Patients discharged the day of surgery will not be allowed to drive home. If going home the same day of surgery, must have someone stay with you  first 24 hrs.at home and arrange for someone to drive you home from the Hospital.              YOUR DRIVER IS: Francesco Sor   Please read over the following fact sheets that you were given: MRSA INFORMATION,INCENTIVE SPIROMETRY SHEET, SLEEP APNEA SHEET                            Telford Nab.Maxmilian Trostel,RN,BSN     [phone]

## 2012-03-28 ENCOUNTER — Encounter (HOSPITAL_COMMUNITY): Payer: Self-pay | Admitting: *Deleted

## 2012-03-28 NOTE — Pre-Procedure Instructions (Signed)
Patient was called by nurse and reviewed instructions for surgery 04-04-2012-reinforced to do bath with CHG on 04-02-2012 and 04-03-2012 and NPO after midnight and take Wellbutrin and Atenolol am of surgery with sip of water. Patient verbalized understanding of instructions.

## 2012-04-04 ENCOUNTER — Ambulatory Visit (HOSPITAL_COMMUNITY)
Admission: RE | Admit: 2012-04-04 | Discharge: 2012-04-04 | Disposition: A | Discharge status: Home / Self Care | Payer: Medicare Other | Source: Ambulatory Visit | Attending: Urology | Admitting: Urology

## 2012-04-04 ENCOUNTER — Ambulatory Visit (HOSPITAL_COMMUNITY): Payer: Medicare Other | Admitting: Anesthesiology

## 2012-04-04 ENCOUNTER — Encounter (HOSPITAL_COMMUNITY): Payer: Self-pay | Admitting: *Deleted

## 2012-04-04 ENCOUNTER — Encounter (HOSPITAL_COMMUNITY)
Admission: RE | Disposition: A | Discharge status: Home / Self Care | Payer: Self-pay | Source: Ambulatory Visit | Attending: Urology

## 2012-04-04 ENCOUNTER — Encounter (HOSPITAL_COMMUNITY): Payer: Self-pay | Admitting: Anesthesiology

## 2012-04-04 DIAGNOSIS — G4730 Sleep apnea, unspecified: Secondary | ICD-10-CM | POA: Insufficient documentation

## 2012-04-04 DIAGNOSIS — N135 Crossing vessel and stricture of ureter without hydronephrosis: Secondary | ICD-10-CM | POA: Insufficient documentation

## 2012-04-04 DIAGNOSIS — R310 Gross hematuria: Secondary | ICD-10-CM

## 2012-04-04 DIAGNOSIS — I10 Essential (primary) hypertension: Secondary | ICD-10-CM | POA: Insufficient documentation

## 2012-04-04 DIAGNOSIS — Z7901 Long term (current) use of anticoagulants: Secondary | ICD-10-CM | POA: Insufficient documentation

## 2012-04-04 DIAGNOSIS — Z8546 Personal history of malignant neoplasm of prostate: Secondary | ICD-10-CM | POA: Insufficient documentation

## 2012-04-04 DIAGNOSIS — E119 Type 2 diabetes mellitus without complications: Secondary | ICD-10-CM | POA: Insufficient documentation

## 2012-04-04 DIAGNOSIS — Z79899 Other long term (current) drug therapy: Secondary | ICD-10-CM | POA: Insufficient documentation

## 2012-04-04 DIAGNOSIS — E7800 Pure hypercholesterolemia, unspecified: Secondary | ICD-10-CM | POA: Insufficient documentation

## 2012-04-04 HISTORY — PX: CYSTOSCOPY WITH RETROGRADE PYELOGRAM, URETEROSCOPY AND STENT PLACEMENT: SHX5789

## 2012-04-04 HISTORY — PX: CYSTOSCOPY W/ RETROGRADES: SHX1426

## 2012-04-04 LAB — GLUCOSE, CAPILLARY
Glucose-Capillary: 110 mg/dL — ABNORMAL HIGH (ref 70–99)
Glucose-Capillary: 133 mg/dL — ABNORMAL HIGH (ref 70–99)

## 2012-04-04 LAB — PROTIME-INR
INR: 1.11 (ref 0.00–1.49)
Prothrombin Time: 14.2 seconds (ref 11.6–15.2)

## 2012-04-04 SURGERY — CYSTOSCOPY, WITH RETROGRADE PYELOGRAM
Anesthesia: General | Laterality: Right | Wound class: Clean Contaminated

## 2012-04-04 MED ORDER — FENTANYL CITRATE 0.05 MG/ML IJ SOLN
25.0000 ug | INTRAMUSCULAR | Status: DC | PRN
  Administered 2012-04-04: 25 ug via INTRAVENOUS
  Administered 2012-04-04: 50 ug via INTRAVENOUS

## 2012-04-04 MED ORDER — HYDROMORPHONE HCL PF 1 MG/ML IJ SOLN
0.2500 mg | INTRAMUSCULAR | Status: DC | PRN
  Administered 2012-04-04: 0.5 mg via INTRAVENOUS

## 2012-04-04 MED ORDER — OXYCODONE HCL 5 MG/5ML PO SOLN
5.0000 mg | Freq: Once | ORAL | Status: DC | PRN
  Filled 2012-04-04: qty 5

## 2012-04-04 MED ORDER — LACTATED RINGERS IV SOLN
INTRAVENOUS | Status: DC | PRN
  Administered 2012-04-04 (×2): via INTRAVENOUS

## 2012-04-04 MED ORDER — LIDOCAINE HCL 2 % EX GEL
CUTANEOUS | Status: AC
  Filled 2012-04-04: qty 10

## 2012-04-04 MED ORDER — OXYCODONE HCL 5 MG PO TABS: 5.0000 mg | ORAL_TABLET | Freq: Once | ORAL | Status: DC | PRN

## 2012-04-04 MED ORDER — IOHEXOL 300 MG/ML  SOLN
INTRAMUSCULAR | Status: AC
  Filled 2012-04-04: qty 1

## 2012-04-04 MED ORDER — HYDROMORPHONE HCL PF 1 MG/ML IJ SOLN
INTRAMUSCULAR | Status: AC
  Filled 2012-04-04: qty 1

## 2012-04-04 MED ORDER — PHENAZOPYRIDINE HCL 100 MG PO TABS
100.0000 mg | ORAL_TABLET | Freq: Three times a day (TID) | ORAL | Status: DC
  Administered 2012-04-04: 100 mg via ORAL
  Filled 2012-04-04 (×4): qty 1

## 2012-04-04 MED ORDER — FENTANYL CITRATE 0.05 MG/ML IJ SOLN
INTRAMUSCULAR | Status: AC
  Filled 2012-04-04: qty 2

## 2012-04-04 MED ORDER — MIDAZOLAM HCL 5 MG/5ML IJ SOLN
INTRAMUSCULAR | Status: DC | PRN
  Administered 2012-04-04: 1 mg via INTRAVENOUS

## 2012-04-04 MED ORDER — ONDANSETRON HCL 4 MG/2ML IJ SOLN
INTRAMUSCULAR | Status: DC | PRN
  Administered 2012-04-04: 4 mg via INTRAVENOUS

## 2012-04-04 MED ORDER — HYOSCYAMINE SULFATE 0.125 MG PO TABS: 0.1250 mg | ORAL_TABLET | ORAL | Status: DC | PRN

## 2012-04-04 MED ORDER — CEFAZOLIN SODIUM-DEXTROSE 2-3 GM-% IV SOLR
2.0000 g | INTRAVENOUS | Status: AC
  Administered 2012-04-04: 2 g via INTRAVENOUS

## 2012-04-04 MED ORDER — PROMETHAZINE HCL 25 MG/ML IJ SOLN: 6.2500 mg | INTRAMUSCULAR | Status: DC | PRN

## 2012-04-04 MED ORDER — LIDOCAINE HCL 2 % EX GEL
CUTANEOUS | Status: DC | PRN
  Administered 2012-04-04: 1 via URETHRAL

## 2012-04-04 MED ORDER — CEFAZOLIN SODIUM-DEXTROSE 2-3 GM-% IV SOLR
INTRAVENOUS | Status: AC
  Filled 2012-04-04: qty 50

## 2012-04-04 MED ORDER — SODIUM CHLORIDE 0.9 % IR SOLN
Status: DC | PRN
  Administered 2012-04-04: 3000 mL

## 2012-04-04 MED ORDER — IOHEXOL 350 MG/ML SOLN
INTRAVENOUS | Status: DC | PRN
  Administered 2012-04-04: 10 mL

## 2012-04-04 MED ORDER — CEPHALEXIN 500 MG PO CAPS: 500.0000 mg | ORAL_CAPSULE | Freq: Three times a day (TID) | ORAL | Status: DC

## 2012-04-04 MED ORDER — OXYCODONE-ACETAMINOPHEN 5-325 MG PO TABS: 1.0000 | ORAL_TABLET | ORAL | Status: DC | PRN

## 2012-04-04 MED ORDER — DEXAMETHASONE SODIUM PHOSPHATE 10 MG/ML IJ SOLN
INTRAMUSCULAR | Status: DC | PRN
  Administered 2012-04-04: 10 mg via INTRAVENOUS

## 2012-04-04 MED ORDER — FENTANYL CITRATE 0.05 MG/ML IJ SOLN
INTRAMUSCULAR | Status: DC | PRN
  Administered 2012-04-04 (×4): 25 ug via INTRAVENOUS
  Administered 2012-04-04: 50 ug via INTRAVENOUS
  Administered 2012-04-04 (×5): 25 ug via INTRAVENOUS

## 2012-04-04 MED ORDER — ACETAMINOPHEN 10 MG/ML IV SOLN: 1000.0000 mg | Freq: Once | INTRAVENOUS | Status: DC | PRN

## 2012-04-04 MED ORDER — BELLADONNA ALKALOIDS-OPIUM 16.2-60 MG RE SUPP
RECTAL | Status: AC
  Filled 2012-04-04: qty 1

## 2012-04-04 MED ORDER — BELLADONNA ALKALOIDS-OPIUM 16.2-60 MG RE SUPP
RECTAL | Status: DC | PRN
  Administered 2012-04-04: 1 via RECTAL

## 2012-04-04 MED ORDER — SODIUM CHLORIDE 0.9 % IR SOLN
Status: DC | PRN
  Administered 2012-04-04: 1000 mL

## 2012-04-04 MED ORDER — PHENAZOPYRIDINE HCL 100 MG PO TABS: 100.0000 mg | ORAL_TABLET | Freq: Three times a day (TID) | ORAL | Status: DC | PRN

## 2012-04-04 MED ORDER — MEPERIDINE HCL 50 MG/ML IJ SOLN: 6.2500 mg | INTRAMUSCULAR | Status: DC | PRN

## 2012-04-04 MED ORDER — PROPOFOL 10 MG/ML IV BOLUS
INTRAVENOUS | Status: DC | PRN
  Administered 2012-04-04: 150 mg via INTRAVENOUS

## 2012-04-04 MED ORDER — OXYCODONE-ACETAMINOPHEN 5-325 MG PO TABS
1.0000 | ORAL_TABLET | ORAL | Status: DC | PRN
  Administered 2012-04-04: 1 via ORAL
  Filled 2012-04-04: qty 2

## 2012-04-04 MED ORDER — SENNOSIDES-DOCUSATE SODIUM 8.6-50 MG PO TABS: 1.0000 | ORAL_TABLET | Freq: Two times a day (BID) | ORAL | Status: DC

## 2012-04-04 MED ORDER — STERILE WATER FOR IRRIGATION IR SOLN
Status: DC | PRN
  Administered 2012-04-04: 6000 mL

## 2012-04-04 SURGICAL SUPPLY — 31 items
BAG URINE LEG 500ML (DRAIN) ×3 IMPLANT
BAG URO CATCHER STRL LF (DRAPE) ×3 IMPLANT
BASKET LASER NITINOL 1.9FR (BASKET) IMPLANT
BASKET STNLS GEMINI 4WIRE 3FR (BASKET) IMPLANT
BASKET ZERO TIP NITINOL 2.4FR (BASKET) IMPLANT
BRUSH URET BIOPSY 3F (UROLOGICAL SUPPLIES) IMPLANT
CATH CLEAR GEL 3F BACKSTOP (CATHETERS) IMPLANT
CATH TIEMANN FOLEY 18FR 5CC (CATHETERS) ×3 IMPLANT
CATH URET 5FR 28IN CONE TIP (BALLOONS)
CATH URET 5FR 28IN OPEN ENDED (CATHETERS) ×3 IMPLANT
CATH URET 5FR 70CM CONE TIP (BALLOONS) IMPLANT
CATH URET DUAL LUMEN 6-10FR 50 (CATHETERS) ×3 IMPLANT
DRAPE CAMERA CLOSED 9X96 (DRAPES) ×3 IMPLANT
GLOVE ECLIPSE 7.0 STRL STRAW (GLOVE) ×3 IMPLANT
GOWN PREVENTION PLUS LG XLONG (DISPOSABLE) ×3 IMPLANT
GUIDEWIRE ANG ZIPWIRE 038X150 (WIRE) IMPLANT
GUIDEWIRE STR DUAL SENSOR (WIRE) ×6 IMPLANT
IV NS IRRIG 3000ML ARTHROMATIC (IV SOLUTION) IMPLANT
KIT BALLIN UROMAX 15FX10 (LABEL) IMPLANT
KIT BALLN UROMAX 15FX4 (MISCELLANEOUS) ×4 IMPLANT
KIT BALLN UROMAX 26 75X4 (MISCELLANEOUS) ×2
LASER FIBER DISP (UROLOGICAL SUPPLIES) ×3 IMPLANT
MANIFOLD NEPTUNE II (INSTRUMENTS) ×3 IMPLANT
PACK CYSTO (CUSTOM PROCEDURE TRAY) ×3 IMPLANT
SET HIGH PRES BAL DIL (LABEL)
SHEATH ACCESS URETERAL 24CM (SHEATH) ×3 IMPLANT
SHEATH ACCESS URETERAL 38CM (SHEATH) ×3 IMPLANT
SHEATH URET ACCESS 12FR/35CM (UROLOGICAL SUPPLIES) IMPLANT
SHEATH URET ACCESS 12FR/55CM (UROLOGICAL SUPPLIES) IMPLANT
SYRINGE IRR TOOMEY STRL 70CC (SYRINGE) ×3 IMPLANT
TUBING CONNECTING 10 (TUBING) ×3 IMPLANT

## 2012-04-04 NOTE — Anesthesia Preprocedure Evaluation (Addendum)
Anesthesia Evaluation  Patient identified by MRN, date of birth, ID band Patient awake    Reviewed: Allergy & Precautions, H&P , NPO status , Patient's Chart, lab work & pertinent test results, reviewed documented beta blocker date and time   Airway Mallampati: II TM Distance: >3 FB Neck ROM: Full    Dental  (+) Edentulous Upper, Edentulous Lower and Dental Advisory Given   Pulmonary shortness of breath and with exertion, sleep apnea ,  breath sounds clear to auscultation  Pulmonary exam normal       Cardiovascular hypertension, Pt. on medications and Pt. on home beta blockers - CAD and - Past MI + dysrhythmias Atrial Fibrillation Rhythm:Irregular Rate:Normal  Echo 09/2011  - Left ventricle: The cavity size was normal. There was mild focal basal hypertrophy of the septum. Systolic function was normal. The estimated ejection fraction was in the range of 55% to 60%. Wall motion was normal; there were no regional wall motion abnormalities. The study is not technically sufficient to allow evaluation of LV diastolic function. - Aortic valve: Trivial regurgitation. - Mitral valve: Moderate regurgitation. - Left atrium: The atrium was moderately dilated. - Right ventricle: The cavity size was mildly dilated. - Right atrium: The atrium was mildly dilated. - Pulmonary arteries: Systolic pressure was mildly   increased. PA peak pressure: 32mm Hg (S).   Neuro/Psych PSYCHIATRIC DISORDERS Depression    GI/Hepatic negative GI ROS, Neg liver ROS,   Endo/Other  diabetes  Renal/GU      Musculoskeletal negative musculoskeletal ROS (+)   Abdominal   Peds  Hematology negative hematology ROS (+)   Anesthesia Other Findings   Reproductive/Obstetrics                         Anesthesia Physical Anesthesia Plan  ASA: III  Anesthesia Plan: General   Post-op Pain Management:    Induction: Intravenous  Airway  Management Planned: Oral ETT  Additional Equipment:   Intra-op Plan:   Post-operative Plan: Extubation in OR  Informed Consent: I have reviewed the patients History and Physical, chart, labs and discussed the procedure including the risks, benefits and alternatives for the proposed anesthesia with the patient or authorized representative who has indicated his/her understanding and acceptance.   Dental advisory given  Plan Discussed with: CRNA  Anesthesia Plan Comments:        Anesthesia Quick Evaluation

## 2012-04-04 NOTE — Transfer of Care (Signed)
Immediate Anesthesia Transfer of Care Note  Patient: [name]  Procedure(s) Performed: Procedure(s) (LRB) with comments: CYSTOSCOPY WITH RETROGRADE PYELOGRAM (Bilateral) URETERAL DILITATION (Right) - Right endopyelotomy URETEROSCOPY (Right)  Patient Location: PACU  Anesthesia Type:General  Level of Consciousness: awake, oriented and patient cooperative  Airway & Oxygen Therapy: Patient Spontanous Breathing and Patient connected to face mask oxygen  Post-op Assessment: Report given to PACU RN and Post -op Vital signs reviewed and stable  Post vital signs: Reviewed and stable  Complications: No apparent anesthesia complications

## 2012-04-04 NOTE — H&P (Signed)
Urology History and Physical Exam  CC: Gross hematuria  HPI: 66 year old male presents today for cystoscopy, bilateral retrograde pyelograms, left ureteroscopy with biopsy, and possible left ureter stent, possible right ureteroscopy with biopsy, and mapping bladder biopsies for gross hematuria. This began in July 2013. It was the color of cranberry juice. It was not associated with dysuria. There were no aggravating or alleviating factors. He has a history of atrial fibrillation for which he was taking Coumadin. He also had a history of smoking for 10 years; but he had quit 25 years ago. He proceeded with workup with a contrasted CT of the abdomen and pelvis in July 2013. This revealed some simple renal cysts. It also revealed incomplete opacification of the left distal ureter. There was no associated enhancement of the ureter or hydro-ureter more proximal to this area. Cystoscopy 12/11/11 in the office was negative for concerning findings. He did not have any further gross hematuria, but a voided cytology revealed abnormal cells which were suspicious for malignancy. He was cleared by his cardiologist, Dr. Shirlee Latch, to hold warfarin for 5 days prior to this procedure. Urine culture 03/18/12 was negative for urinary tract infection with insignificant growth. We have discussed the risks, benefits, alternatives, and likelihood of achieving goals.  PMH: Past Medical History  Diagnosis Date  . Palpitations   . Arrhythmia   . Prostate cancer     seed implantion  . Diabetes mellitus   . Hypercholesteremia   . Sleep apnea     uses CPAP  . Hypertension   . Depression     PSH: Past Surgical History  Procedure Date  . Seed implants     Allergies: Allergies  Allergen Reactions  . Sulfa Drugs Cross Reactors Other (See Comments)  . Tetracyclines & Related Other (See Comments)    Medications: Prescriptions prior to admission  Medication Sig Dispense Refill  . ALLOPURINOL PO Take 300 mg by mouth  every morning.       Marland Kitchen atenolol (TENORMIN) 50 MG tablet Take 25 mg by mouth daily. Pt takes 1/2 tab for 25 mg dose      . atorvastatin (LIPITOR) 10 MG tablet Take 10 mg by mouth every evening.       . beta carotene w/minerals (OCUVITE) tablet Take 1 tablet by mouth daily.      . BuPROPion HCl (WELLBUTRIN PO) Take 100 mg by mouth every morning.       Marland Kitchen LISINOPRIL PO Take 2.5 mg by mouth every morning.       . metFORMIN (GLUCOPHAGE) 500 MG tablet Take 250 mg by mouth every morning.       . Multiple Vitamin (MULTIVITAMIN PO) Take by mouth daily.       . TRAMADOL HCL PO Take 50 mg by mouth 2 (two) times daily.       Marland Kitchen warfarin (COUMADIN) 4 MG tablet Take 4-6 mg by mouth See admin instructions. Pt takes 4 mg every day except on Tuesday and Friday pt takes 6 mg. ( 1.5 tab )         Social History: History   Social History  . Marital Status: Married    Spouse Name: N/A    Number of Children: N/A  . Years of Education: N/A   Occupational History  . Not on file.   Social History Main Topics  . Smoking status: Former Smoker    Quit date: 05/15/1973  . Smokeless tobacco: Not on file  . Alcohol Use: Yes  Comment: weekly beer or cocktail  . Drug Use: No  . Sexually Active:    Other Topics Concern  . Not on file   Social History Narrative  . No narrative on file    Family History: Family History  Problem Relation Age of Onset  . Cancer Mother   . Heart attack Sister   . Stroke Sister     Review of Systems: Positive: None. Negative: Fever, chest pain, or SOB.  A further 10 point review of systems was negative except what is listed in the HPI.  Physical Exam: Filed Vitals:   04/04/12 0518  BP: 131/85  Pulse: 53  Temp: 97.5 F (36.4 C)  Resp: 20    General: No acute distress.  Awake. Head:  Normocephalic.  Atraumatic. ENT:  EOMI.  Mucous membranes moist Neck:  Supple.  No lymphadenopathy. CV:  Irregularly irregular. Pulmonary: Equal effort bilaterally.  Clear to  auscultation bilaterally. Abdomen: Soft.  Non- tender to palpation. Skin:  Normal turgor.  No visible rash. Extremity: No gross deformity of bilateral upper extremities.  No gross deformity of    bilateral lower extremities. Neurologic: Alert. Appropriate mood.   Studies:  No results found for this basename: HGB:2,WBC:2,PLT:2 in the last 72 hours  No results found for this basename: NA:2,K:2,CL:2,CO2:2,BUN:2,CREATININE:2,CALCIUM:2,MAGNESIUM:2,GFRNONAA:2,GFRAA:2 in the last 72 hours   No results found for this basename: PT:2,INR:2,APTT:2 in the last 72 hours   No components found with this basename: ABG:2    Assessment:  Gross hematuria  Plan: To OR for  cystoscopy, bilateral retrograde pyelograms, left ureteroscopy with biopsy, and possible left ureter stent, possible right ureteroscopy with biopsy, and mapping bladder biopsies.

## 2012-04-04 NOTE — Anesthesia Postprocedure Evaluation (Signed)
Anesthesia Post Note  Patient: [name]  Procedure(s) Performed: Procedure(s) (LRB): CYSTOSCOPY WITH RETROGRADE PYELOGRAM (Bilateral) URETERAL DILITATION (Right) CYSTOSCOPY WITH RETROGRADE PYELOGRAM, URETEROSCOPY AND STENT PLACEMENT (Right)  Anesthesia type: General  Patient location: PACU  Post pain: Pain level controlled  Post assessment: Post-op Vital signs reviewed  Last Vitals: BP 159/90  Pulse 64  Temp 36.3 C  Resp 16  SpO2 96%  Post vital signs: Reviewed  Level of consciousness: sedated  Complications: No apparent anesthesia complications

## 2012-04-04 NOTE — Op Note (Signed)
Urology Operative Report  Date of Procedure: 04/04/12  Surgeon: Natalia Leatherwood, MD Assistant: None  Preoperative Diagnosis: Gross hematuria Postoperative Diagnosis: Same, right ureter strictures, right UPJ stricture.  Procedure(s): Cystoscopy Mapping bladder biopsies Bilateral retrograde pyelograms Right ureteroscopy Right ureter balloon dilation Right laser endopyelotomy of UPJ stricture Right ureter stent placement  Estimated blood loss: Minimal  Specimen: Bladder biopsy sent to permanent pathology  Drains: Foley catheter  Complications: None  Findings: Approximately 4 areas of narrowing in the right ureter with stricture noted in the right distal ureter and the right UPJ. No tumors in the upper urinary tracts or bladder.  History of present illness: 66 year old male presents today with history of gross hematuria. Initial workup was negative, but urinary cytology returned as concerning for malignant cells. He presents today for endoscopy for evaluation of possible malignancy.   Procedure in detail: After informed consent was obtained, the patient was taken to the operating room. They were placed in the supine position. SCDs were turned on and in place. IV antibiotics were infused, and general anesthesia was induced. A timeout was performed in which the correct patient, surgical site, and procedure were identified and agreed upon by the team.  The patient was placed in a dorsolithotomy position, making sure to pad all pertinent neurovascular pressure points. The genitals were prepped and draped in the usual sterile fashion.   A rigid cystoscope was advanced through the urethra and into the bladder. It was noted that he had an area of firm stricture in his urethra that was able to be passed with the cystoscope. The bladder was fully distended and evaluated in a systematic fashion with a 70 and 30 lens. There were no tumors noted in the bladder. Mapping bladder biopsies were  taken with cold cup biopsy forceps on the right lateral, left lateral, posterior, and dome of the bladder. These were sent for pathology.  Attention was turned to the right ureter orifice. It was cannulated with a sensor tip wire and a 5 French ureter catheter was placed over this. I injected contrast to obtain a retrograde pyelogram. There appeared to be a filling defect at the right ureteropelvic junction. I advanced a 5 [place] ureter catheter up into the proximal ureter and again injected contrast. This area did not move making it concerning for filling defect instead of a air bubble. Next I placed a sensor tip wire up into the right renal pelvis under fluoroscopy. I attempted to place a flexible digital ureter scope, but this was unsuccessful. I then placed a second sensor tip wire and attempted to place a right ureter access sheath that was 12/14 [place] in size. I was unable to navigate the flexible scope beyond the area of the distal ureter and therefore I placed a ureter dilating balloon over a wire. This was dilated to 12 atmospheres for 2 minutes. The access sheath was replaced as able to navigate to an area in the distal ureter that could not pass. Balloon dilation with the balloon was carried out here as well in the same fashion. Next, I was able to advance into the proximal ureter where I encountered a third area of narrowing that I was unable to pass with the ureter scope. This was dilated with the balloon dilator in a similar fashion. Finally I was able to reach the ureteropelvic junction where there was noted to be a very narrow segment. I dilated this area in a similar fashion with the balloon dilator twice, but was unable to gain  access to the renal pelvis where the filling defect was located. I elected to perform an endopyelotomy using the holmium laser. A 200  laser filament was placed using the settings of 0.5 J and 20 Hz. I made a lateral incision through the scar tissue. After this, the  digital ureter scope was unable to be passed through the area of stricture, but a regular flexible ureteroscope was able to be passed into the renal pelvis. The pelvis was evaluated in a systematic fashion and there were no tumors noted throughout the kidney. The ureteroscope and ureter access sheath were removed and there were noted to be no other injuries other than stretching of the ureter mucosa where it had been dilated. The safety wire was then backloaded onto a rigid cystoscope and a 6-28 double-J ureter stent was placed over the wire under fluoroscopy with ease. Deployed with a good curl noted in the right renal pelvis and a curl in direct visualization; the tethering strings were removed.  Attention was turned to the left ureter. It was cannulated with a 5 [place] ureter catheter and I injected contrast. This ureter and collecting system filled out well without any filling defects. Did not feel that it was necessary to perform ureteroscopy on this side.   This completed the procedure. Urethra lidocaine jelly was placed into the urethra, an 9 French coud-tip catheter was placed into the bladder, and a belladonna and opium was placed into the rectum. Anesthesia was reversed and he was taken to the PACU in stable condition.  I instructed the patient that he may restart his warfarin on Sunday, 04/07/12. His Foley catheter will be removed in the PACU.

## 2012-04-04 NOTE — Preoperative (Signed)
Beta Blockers   Reason not to administer Beta Blockers:Not Applicable 

## 2012-04-05 ENCOUNTER — Encounter (HOSPITAL_COMMUNITY): Payer: Self-pay | Admitting: Urology

## 2012-07-31 ENCOUNTER — Ambulatory Visit (INDEPENDENT_AMBULATORY_CARE_PROVIDER_SITE_OTHER): Payer: Medicare Other | Admitting: Ophthalmology

## 2012-09-06 ENCOUNTER — Encounter: Payer: Self-pay | Admitting: Cardiology

## 2012-10-11 ENCOUNTER — Encounter (INDEPENDENT_AMBULATORY_CARE_PROVIDER_SITE_OTHER): Payer: Medicare Other | Admitting: Ophthalmology

## 2012-10-11 DIAGNOSIS — H3530 Unspecified macular degeneration: Secondary | ICD-10-CM

## 2012-10-11 DIAGNOSIS — E11319 Type 2 diabetes mellitus with unspecified diabetic retinopathy without macular edema: Secondary | ICD-10-CM

## 2012-10-11 DIAGNOSIS — H2510 Age-related nuclear cataract, unspecified eye: Secondary | ICD-10-CM

## 2012-10-11 DIAGNOSIS — I10 Essential (primary) hypertension: Secondary | ICD-10-CM

## 2012-10-11 DIAGNOSIS — E1139 Type 2 diabetes mellitus with other diabetic ophthalmic complication: Secondary | ICD-10-CM

## 2012-10-11 DIAGNOSIS — H43819 Vitreous degeneration, unspecified eye: Secondary | ICD-10-CM

## 2012-10-11 DIAGNOSIS — E1165 Type 2 diabetes mellitus with hyperglycemia: Secondary | ICD-10-CM

## 2012-10-11 DIAGNOSIS — H35039 Hypertensive retinopathy, unspecified eye: Secondary | ICD-10-CM

## 2012-10-28 ENCOUNTER — Other Ambulatory Visit (HOSPITAL_COMMUNITY): Payer: Self-pay | Admitting: Urology

## 2012-10-28 DIAGNOSIS — N281 Cyst of kidney, acquired: Secondary | ICD-10-CM

## 2012-12-02 ENCOUNTER — Ambulatory Visit (HOSPITAL_COMMUNITY)
Admission: RE | Admit: 2012-12-02 | Discharge: 2012-12-02 | Disposition: A | Discharge status: Home / Self Care | Payer: Medicare Other | Source: Ambulatory Visit | Attending: Urology | Admitting: Urology

## 2012-12-02 DIAGNOSIS — D1803 Hemangioma of intra-abdominal structures: Secondary | ICD-10-CM | POA: Insufficient documentation

## 2012-12-02 DIAGNOSIS — I700 Atherosclerosis of aorta: Secondary | ICD-10-CM | POA: Insufficient documentation

## 2012-12-02 DIAGNOSIS — N289 Disorder of kidney and ureter, unspecified: Secondary | ICD-10-CM | POA: Insufficient documentation

## 2012-12-02 DIAGNOSIS — N281 Cyst of kidney, acquired: Secondary | ICD-10-CM

## 2012-12-02 LAB — CREATININE, SERUM
Creatinine, Ser: 1.3 mg/dL (ref 0.50–1.35)
GFR calc Af Amer: 64 mL/min — ABNORMAL LOW (ref >90)
GFR calc non Af Amer: 55 mL/min — ABNORMAL LOW (ref >90)

## 2012-12-02 MED ORDER — GADOBENATE DIMEGLUMINE 529 MG/ML IV SOLN
20.0000 mL | Freq: Once | INTRAVENOUS | Status: AC | PRN
  Administered 2012-12-02: 20 mL via INTRAVENOUS

## 2012-12-24 ENCOUNTER — Other Ambulatory Visit (HOSPITAL_COMMUNITY): Payer: Self-pay | Admitting: Urology

## 2012-12-24 DIAGNOSIS — N135 Crossing vessel and stricture of ureter without hydronephrosis: Secondary | ICD-10-CM

## 2012-12-24 DIAGNOSIS — C61 Malignant neoplasm of prostate: Secondary | ICD-10-CM

## 2013-01-02 ENCOUNTER — Other Ambulatory Visit (HOSPITAL_COMMUNITY): Payer: Self-pay | Admitting: Urology

## 2013-01-02 DIAGNOSIS — N135 Crossing vessel and stricture of ureter without hydronephrosis: Secondary | ICD-10-CM

## 2013-01-03 ENCOUNTER — Encounter (HOSPITAL_COMMUNITY)
Admission: RE | Admit: 2013-01-03 | Discharge: 2013-01-03 | Disposition: A | Discharge status: Home / Self Care | Payer: Medicare Other | Source: Ambulatory Visit | Attending: Urology | Admitting: Urology

## 2013-01-03 DIAGNOSIS — R948 Abnormal results of function studies of other organs and systems: Secondary | ICD-10-CM | POA: Insufficient documentation

## 2013-01-03 DIAGNOSIS — M545 Low back pain, unspecified: Secondary | ICD-10-CM | POA: Insufficient documentation

## 2013-01-03 DIAGNOSIS — C61 Malignant neoplasm of prostate: Secondary | ICD-10-CM | POA: Insufficient documentation

## 2013-01-03 MED ORDER — TECHNETIUM TC 99M MEDRONATE IV KIT
25.0000 | PACK | Freq: Once | INTRAVENOUS | Status: AC | PRN
  Administered 2013-01-03: 25 via INTRAVENOUS

## 2013-01-06 ENCOUNTER — Encounter (HOSPITAL_COMMUNITY): Payer: Medicare Other

## 2013-01-07 ENCOUNTER — Other Ambulatory Visit (HOSPITAL_COMMUNITY): Payer: Self-pay | Admitting: Urology

## 2013-01-07 DIAGNOSIS — C61 Malignant neoplasm of prostate: Secondary | ICD-10-CM

## 2013-01-09 ENCOUNTER — Ambulatory Visit (HOSPITAL_COMMUNITY)
Admission: RE | Admit: 2013-01-09 | Discharge: 2013-01-09 | Disposition: A | Discharge status: Home / Self Care | Payer: Medicare Other | Source: Ambulatory Visit | Attending: Urology | Admitting: Urology

## 2013-01-09 ENCOUNTER — Encounter (HOSPITAL_COMMUNITY)
Admission: RE | Admit: 2013-01-09 | Discharge: 2013-01-09 | Disposition: A | Discharge status: Home / Self Care | Payer: Medicare Other | Source: Ambulatory Visit | Attending: Urology | Admitting: Urology

## 2013-01-09 DIAGNOSIS — N135 Crossing vessel and stricture of ureter without hydronephrosis: Secondary | ICD-10-CM

## 2013-01-09 DIAGNOSIS — IMO0002 Reserved for concepts with insufficient information to code with codable children: Secondary | ICD-10-CM | POA: Insufficient documentation

## 2013-01-09 DIAGNOSIS — M899 Disorder of bone, unspecified: Secondary | ICD-10-CM | POA: Insufficient documentation

## 2013-01-09 DIAGNOSIS — C61 Malignant neoplasm of prostate: Secondary | ICD-10-CM | POA: Insufficient documentation

## 2013-01-09 DIAGNOSIS — R937 Abnormal findings on diagnostic imaging of other parts of musculoskeletal system: Secondary | ICD-10-CM | POA: Insufficient documentation

## 2013-01-09 MED ORDER — TECHNETIUM TC 99M MERTIATIDE
16.0000 | Freq: Once | INTRAVENOUS | Status: AC | PRN
  Administered 2013-01-09: 16 via INTRAVENOUS

## 2013-01-09 MED ORDER — FUROSEMIDE 10 MG/ML IJ SOLN
52.0000 mg | Freq: Once | INTRAMUSCULAR | Status: AC
  Administered 2013-01-09: 52 mg via INTRAVENOUS
  Filled 2013-01-09: qty 6

## 2013-01-09 MED ORDER — GADOBENATE DIMEGLUMINE 529 MG/ML IV SOLN
20.0000 mL | Freq: Once | INTRAVENOUS | Status: AC | PRN
  Administered 2013-01-09: 20 mL via INTRAVENOUS

## 2013-01-27 ENCOUNTER — Telehealth: Payer: Self-pay | Admitting: Cardiology

## 2013-01-27 NOTE — Telephone Encounter (Signed)
Received request from Nurse, documents faxed for surgical clearance. To: [facility]  Fax number: [phone] Attention: 01/27/13/KM

## 2013-03-19 ENCOUNTER — Other Ambulatory Visit: Payer: Self-pay | Admitting: *Deleted

## 2013-03-19 DIAGNOSIS — J984 Other disorders of lung: Secondary | ICD-10-CM

## 2013-03-25 ENCOUNTER — Ambulatory Visit
Admission: RE | Admit: 2013-03-25 | Discharge: 2013-03-25 | Disposition: A | Discharge status: Home / Self Care | Payer: Medicare Other | Source: Ambulatory Visit | Attending: Internal Medicine | Admitting: Internal Medicine

## 2013-03-25 DIAGNOSIS — J984 Other disorders of lung: Secondary | ICD-10-CM

## 2013-09-21 ENCOUNTER — Encounter (HOSPITAL_COMMUNITY): Payer: Self-pay | Admitting: Emergency Medicine

## 2013-09-21 ENCOUNTER — Emergency Department (HOSPITAL_COMMUNITY): Payer: Medicare Other

## 2013-09-21 ENCOUNTER — Emergency Department (HOSPITAL_COMMUNITY)
Admission: EM | Admit: 2013-09-21 | Discharge: 2013-09-21 | Disposition: A | Discharge status: Home / Self Care | Payer: Medicare Other | Attending: Emergency Medicine | Admitting: Emergency Medicine

## 2013-09-21 DIAGNOSIS — S335XXA Sprain of ligaments of lumbar spine, initial encounter: Secondary | ICD-10-CM | POA: Insufficient documentation

## 2013-09-21 DIAGNOSIS — S39012A Strain of muscle, fascia and tendon of lower back, initial encounter: Secondary | ICD-10-CM

## 2013-09-21 DIAGNOSIS — G4730 Sleep apnea, unspecified: Secondary | ICD-10-CM | POA: Insufficient documentation

## 2013-09-21 DIAGNOSIS — F3289 Other specified depressive episodes: Secondary | ICD-10-CM | POA: Insufficient documentation

## 2013-09-21 DIAGNOSIS — Y929 Unspecified place or not applicable: Secondary | ICD-10-CM | POA: Insufficient documentation

## 2013-09-21 DIAGNOSIS — Z8546 Personal history of malignant neoplasm of prostate: Secondary | ICD-10-CM | POA: Insufficient documentation

## 2013-09-21 DIAGNOSIS — I10 Essential (primary) hypertension: Secondary | ICD-10-CM | POA: Insufficient documentation

## 2013-09-21 DIAGNOSIS — E119 Type 2 diabetes mellitus without complications: Secondary | ICD-10-CM | POA: Insufficient documentation

## 2013-09-21 DIAGNOSIS — Z7901 Long term (current) use of anticoagulants: Secondary | ICD-10-CM | POA: Insufficient documentation

## 2013-09-21 DIAGNOSIS — X58XXXA Exposure to other specified factors, initial encounter: Secondary | ICD-10-CM

## 2013-09-21 DIAGNOSIS — M549 Dorsalgia, unspecified: Secondary | ICD-10-CM

## 2013-09-21 DIAGNOSIS — Z9981 Dependence on supplemental oxygen: Secondary | ICD-10-CM | POA: Insufficient documentation

## 2013-09-21 DIAGNOSIS — Y9389 Activity, other specified: Secondary | ICD-10-CM | POA: Insufficient documentation

## 2013-09-21 DIAGNOSIS — X503XXA Overexertion from repetitive movements, initial encounter: Secondary | ICD-10-CM | POA: Insufficient documentation

## 2013-09-21 DIAGNOSIS — F329 Major depressive disorder, single episode, unspecified: Secondary | ICD-10-CM | POA: Insufficient documentation

## 2013-09-21 DIAGNOSIS — Z79899 Other long term (current) drug therapy: Secondary | ICD-10-CM | POA: Insufficient documentation

## 2013-09-21 DIAGNOSIS — S3981XA Other specified injuries of abdomen, initial encounter: Secondary | ICD-10-CM | POA: Insufficient documentation

## 2013-09-21 DIAGNOSIS — E7800 Pure hypercholesterolemia, unspecified: Secondary | ICD-10-CM | POA: Insufficient documentation

## 2013-09-21 DIAGNOSIS — Z87891 Personal history of nicotine dependence: Secondary | ICD-10-CM | POA: Insufficient documentation

## 2013-09-21 LAB — URINE MICROSCOPIC-ADD ON

## 2013-09-21 LAB — URINALYSIS, ROUTINE W REFLEX MICROSCOPIC
Bilirubin Urine: NEGATIVE
Glucose, UA: NEGATIVE mg/dL
Ketones, ur: NEGATIVE mg/dL
Leukocytes, UA: NEGATIVE
Nitrite: NEGATIVE
Protein, ur: NEGATIVE mg/dL
Specific Gravity, Urine: 1.018 (ref 1.005–1.030)
Urobilinogen, UA: 0.2 mg/dL (ref 0.0–1.0)
pH: 5 (ref 5.0–8.0)

## 2013-09-21 LAB — COMPREHENSIVE METABOLIC PANEL
ALT: 22 U/L (ref 0–53)
AST: 25 U/L (ref 0–37)
Albumin: 4.2 g/dL (ref 3.5–5.2)
Alkaline Phosphatase: 57 U/L (ref 39–117)
BUN: 25 mg/dL — ABNORMAL HIGH (ref 6–23)
CO2: 26 mEq/L (ref 19–32)
Calcium: 9.3 mg/dL (ref 8.4–10.5)
Chloride: 99 mEq/L (ref 96–112)
Creatinine, Ser: 1.36 mg/dL — ABNORMAL HIGH (ref 0.50–1.35)
GFR calc Af Amer: 60 mL/min — ABNORMAL LOW (ref >90)
GFR calc non Af Amer: 52 mL/min — ABNORMAL LOW (ref >90)
Glucose, Bld: 110 mg/dL — ABNORMAL HIGH (ref 70–99)
Potassium: 4.1 mEq/L (ref 3.7–5.3)
Sodium: 137 mEq/L (ref 137–147)
Total Bilirubin: 0.7 mg/dL (ref 0.3–1.2)
Total Protein: 7.2 g/dL (ref 6.0–8.3)

## 2013-09-21 LAB — CBC WITH DIFFERENTIAL/PLATELET
Basophils Absolute: 0 10*3/uL (ref 0.0–0.1)
Basophils Relative: 0 % (ref 0–1)
Eosinophils Absolute: 0.1 10*3/uL (ref 0.0–0.7)
Eosinophils Relative: 1 % (ref 0–5)
HCT: 44.2 % (ref 39.0–52.0)
Hemoglobin: 14.9 g/dL (ref 13.0–17.0)
Lymphocytes Relative: 10 % — ABNORMAL LOW (ref 12–46)
Lymphs Abs: 0.8 10*3/uL (ref 0.7–4.0)
MCH: 30.3 pg (ref 26.0–34.0)
MCHC: 33.7 g/dL (ref 30.0–36.0)
MCV: 90 fL (ref 78.0–100.0)
Monocytes Absolute: 0.6 10*3/uL (ref 0.1–1.0)
Monocytes Relative: 8 % (ref 3–12)
Neutro Abs: 6 10*3/uL (ref 1.7–7.7)
Neutrophils Relative %: 81 % — ABNORMAL HIGH (ref 43–77)
Platelets: 136 10*3/uL — ABNORMAL LOW (ref 150–400)
RBC: 4.91 MIL/uL (ref 4.22–5.81)
RDW: 12.6 % (ref 11.5–15.5)
WBC: 7.5 10*3/uL (ref 4.0–10.5)

## 2013-09-21 LAB — LIPASE, BLOOD: Lipase: 42 U/L (ref 11–59)

## 2013-09-21 MED ORDER — HYDROCODONE-ACETAMINOPHEN 5-325 MG PO TABS: 2.0000 | ORAL_TABLET | ORAL | Status: DC | PRN

## 2013-09-21 MED ORDER — HYDROCODONE-ACETAMINOPHEN 5-325 MG PO TABS
1.0000 | ORAL_TABLET | Freq: Once | ORAL | Status: AC
  Administered 2013-09-21: 1 via ORAL
  Filled 2013-09-21: qty 1

## 2013-09-21 MED ORDER — METHOCARBAMOL 500 MG PO TABS: 500.0000 mg | ORAL_TABLET | Freq: Two times a day (BID) | ORAL | Status: DC

## 2013-09-21 MED ORDER — METHOCARBAMOL 500 MG PO TABS
1000.0000 mg | ORAL_TABLET | Freq: Once | ORAL | Status: AC
  Administered 2013-09-21: 1000 mg via ORAL
  Filled 2013-09-21: qty 2

## 2013-09-21 MED ORDER — ONDANSETRON 4 MG PO TBDP
4.0000 mg | ORAL_TABLET | Freq: Once | ORAL | Status: AC
  Administered 2013-09-21: 4 mg via ORAL
  Filled 2013-09-21: qty 1

## 2013-09-21 NOTE — ED Notes (Signed)
Pt states he started having lower abdominal pain, back pain and testicular pain approx. 6 hours ago. Hx of prostate CA and other urinary problems. [facility] pt. Alert and oriented.

## 2013-09-21 NOTE — ED Provider Notes (Signed)
CSN: [id number]     Arrival date & time 09/21/13  1719 History   First MD Initiated Contact with Patient 09/21/13 1740     Chief Complaint  Patient presents with  . Abdominal Pain  . Back Pain      HPI  Patient presents with a complaint of back pain and low abdominal pain. He started have some discomfort several hours ago. Not sudden in onset. It has been constant like an ache. No crescendoing pain. Is not colicky or intermittent. He unloaded a disheveled in wheelbarrows 7500 pounds a rock over several hours the last few days. He thought his symptoms were simply from that. He urinated. He has no pain or symptoms with this that he had urinated several times over the next hour. He became concerned because he states he's had "kidney problems". 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 That diagnostic pursued was undertaken because of history of prostate cancer treated effectively over 10 years ago with radioactive seeding. Has no history of kidney stones. Per my review of his studies he had no visualization of stones with MRI last year.  Past Medical History  Diagnosis Date  . Palpitations   . Arrhythmia   . Prostate cancer     seed implantion  . Diabetes mellitus   . Hypercholesteremia   . Sleep apnea     uses CPAP  . Hypertension   . Depression    Past Surgical History  Procedure Laterality Date  . Seed implants    . Cystoscopy w/ retrogrades  04/04/2012    Procedure: CYSTOSCOPY WITH RETROGRADE PYELOGRAM;  Surgeon: Molli Hazard, MD;  Location: [facility];  Service: Urology;  Laterality: Bilateral;  . Cystoscopy with retrograde pyelogram, ureteroscopy and stent placement  04/04/2012    Procedure: CYSTOSCOPY WITH RETROGRADE PYELOGRAM, URETEROSCOPY AND STENT PLACEMENT;  Surgeon: Molli Hazard, MD;  Location: [facility];  Service: Urology;  Laterality: Right;   Family History  Problem Relation Age of Onset  . Cancer Mother   . Heart attack Sister   . Stroke Sister    History  Substance Use Topics  . Smoking status: Former Smoker    Quit date: 05/15/1973  . Smokeless tobacco: Not on file  . Alcohol Use: Yes     Comment: weekly beer or cocktail    Review of Systems  Constitutional: Negative for fever, chills, diaphoresis, appetite change and fatigue.  HENT: Negative for mouth sores, sore throat and trouble swallowing.   Eyes: Negative for visual disturbance.  Respiratory: Negative for cough, chest tightness, shortness of breath and wheezing.   Cardiovascular: Negative for chest pain.  Gastrointestinal: Negative for nausea, vomiting, abdominal pain, diarrhea and abdominal distention.  Endocrine: Positive for polyuria. Negative for polydipsia and polyphagia.  Genitourinary: Negative for dysuria, frequency and hematuria.  Musculoskeletal: Positive for back pain. Negative for gait problem.  Skin: Negative for color change, pallor and rash.  Neurological: Negative for dizziness, syncope, light-headedness and headaches.  Hematological: Does not bruise/bleed easily.  Psychiatric/Behavioral: Negative for behavioral problems and confusion.      Allergies  Sulfa drugs cross reactors; Tetracyclines & related; and Contrast media  Home Medications   Prior to Admission medications   Medication Sig Start Date End Date Taking? Authorizing Provider  ALLOPURINOL PO Take 300  mg by mouth every morning.    Yes Historical Provider, MD  atenolol (TENORMIN) 25 MG tablet Take 25 mg by mouth every morning.   Yes Historical Provider, MD  atorvastatin (LIPITOR) 10 MG tablet Take 10 mg by mouth every evening.    Yes Historical Provider, MD  bismuth subsalicylate (PEPTO BISMOL) 262 MG/15ML suspension Take 30 mLs by mouth every 6 (six) hours as needed for indigestion.   Yes Historical  Provider, MD  LISINOPRIL PO Take 2.5 mg by mouth every morning.    Yes Historical Provider, MD  metFORMIN (GLUCOPHAGE) 500 MG tablet Take 500 mg by mouth every morning.    Yes Historical Provider, MD  Multiple Vitamin (MULTIVITAMIN PO) Take by mouth daily.    Yes Historical Provider, MD  senna-docusate (SENOKOT S) 8.6-50 MG per tablet Take 1 tablet by mouth 2 (two) times daily. 04/04/12  Yes Molli Hazard, MD  traMADol (ULTRAM) 50 MG tablet Take 2 tablets by mouth every morning. 09/05/13  Yes Historical Provider, MD  warfarin (COUMADIN) 4 MG tablet Take 1-1.5 tablets by mouth See admin instructions. 1 tab (4mg ) on Tues, Thurs, Sat, Sun and 1.5 tab (6mg ) Mon, Wed, Fri 07/24/13  Yes Historical Provider, MD  HYDROcodone-acetaminophen (NORCO/VICODIN) 5-325 MG per tablet Take 2 tablets by mouth every 4 (four) hours as needed. 09/21/13   Tanna Furry, MD  methocarbamol (ROBAXIN) 500 MG tablet Take 1 tablet (500 mg total) by mouth 2 (two) times daily. 09/21/13   Tanna Furry, MD   BP 121/73  Pulse 66  Temp(Src) 98.7 F (37.1 C) (Oral)  Resp 15  SpO2 95% Physical Exam  Constitutional: He is oriented to person, place, and time. He appears well-developed and well-nourished. No distress.  HENT:  Head: Normocephalic.  Eyes: Conjunctivae are normal. Pupils are equal, round, and reactive to light. No scleral icterus.  Neck: Normal range of motion. Neck supple. No thyromegaly present.  Cardiovascular: Normal rate and regular rhythm.  Exam reveals no gallop and no friction rub.   No murmur heard. Pulmonary/Chest: Effort normal and breath sounds normal. No respiratory distress. He has no wheezes. He has no rales.  Abdominal: Soft. Bowel sounds are normal. He exhibits no distension. There is no tenderness. There is no rebound.  His abdomen and perineum and scrotum are normal to exam and. No hernia. Nontender. Bedside ultrasound shows no urinary retention. Bedside ultrasound shows no left-sided  hydronephrosis.  Nontender in the abdomen.  Musculoskeletal: Normal range of motion.  He does have some paralumbar tenderness bilaterally with with sitting upright and bending or twisting at the waist. Normal neurological exam color extremities.  Neurological: He is alert and oriented to person, place, and time.  Skin: Skin is warm and dry. No rash noted.  Psychiatric: He has a normal mood and affect. His behavior is normal.    ED Course  Procedures (including critical care time) Labs Review Labs Reviewed  CBC WITH DIFFERENTIAL - Abnormal; Notable for the following:    Platelets 136 (*)    Neutrophils Relative % 81 (*)    Lymphocytes Relative 10 (*)    All other components within normal limits  COMPREHENSIVE METABOLIC PANEL - Abnormal; Notable for the following:    Glucose, Bld 110 (*)    BUN 25 (*)    Creatinine, Ser 1.36 (*)    GFR calc non Af Amer 52 (*)    GFR calc Af Amer 60 (*)    All other components within normal limits  URINALYSIS,  ROUTINE W REFLEX MICROSCOPIC - Abnormal; Notable for the following:    Hgb urine dipstick LARGE (*)    All other components within normal limits  LIPASE, BLOOD  URINE MICROSCOPIC-ADD ON    Imaging Review Ct Abdomen Pelvis Wo Contrast  09/21/2013   CLINICAL DATA:  Bilateral flank pain. Hematuria. History of prostate cancer in 2012 treated with brachytherapy.  EXAM: CT ABDOMEN AND PELVIS WITHOUT CONTRAST  TECHNIQUE: Multidetector CT imaging of the abdomen and pelvis was performed following the standard protocol without IV contrast.  COMPARISON:  MRI abdomen 12/02/2012. CT abdomen and pelvis 12/05/2011.  FINDINGS: Moderate hydronephrosis involving the right kidney and dilation of the right ureter, though there is no evidence of an obstructing stone. Perinephric edema surrounding the right kidney. No evidence of hydronephrosis involving the left kidney. Multiple bilateral renal cysts as noted on the prior examinations. Cortical calcification  involving the mid left kidney. No urinary tract calculi on either side.  Mild to moderate splenomegaly, the spleen measuring approximately 13.5 x 7.3 x 14.8 cm, yielding a volume of approximately 729 cc. Normal unenhanced appearance of the liver, pancreas, and adrenal glands. Calcification involving the wall of the gallbladder with a solitary gallstone in the gallbladder, unchanged. No biliary ductal dilation. 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 No significant lymphadenopathy.  Normal-appearing stomach and small bowel. Moderate stool burden. Descending and sigmoid colon diverticulosis without evidence of acute diverticulitis. Remainder of the colon unremarkable. Normal appearing long appendix in the right mid pelvis. No ascites.  Prostate gland mildly enlarged, containing seeds, unchanged. Normal seminal vesicles. Urinary bladder decompressed and unremarkable.  Bone window images demonstrate mild degenerative changes involving the lower thoracic and lumbar spine but no evidence of osseous metastatic disease. Visualized lung bases clear. Heart size normal with right coronary artery calcification.  IMPRESSION: 1. Moderate right hydroureteronephrosis without evidence of an obstructing stone. This is most consistent with recent passage of a urinary calculus. A stricture involving the distal ureter is also a consideration. 2. No urinary tract calculi on either side. 3. Mild-to-moderate splenomegaly. 4. Calcification involving the gallbladder wall. Cholelithiasis. No CT evidence for acute cholecystitis. 5. Descending and sigmoid colon diverticulosis without evidence of acute diverticulitis.   Electronically Signed   By: Evangeline Dakin M.D.   On: 09/21/2013 19:39     EKG Interpretation None      MDM   Final diagnoses:  Back pain  Lumbar strain    Symptoms were improving upon his arrival. He said there down to a 4/10 after being in a poor night at home. His UA does show microscopic  hematuria. CT scan noncontrast was obtained. He has some dilatation of his right collecting system but no stone. Normal left ureter, collecting system, and kidney. No other abnormalities on CT noted.  Discussion: I think his hematuria is likely related to his renal cyst has had multiple dilations found these to be benign. He is followed with urology regarding this. No sensory or a stone now on recent MRI. His pain is musculoskeletal in this hematuria is not related. Plan will be anti-inflammatories, pain medicine, muscle relaxants. Primary care followup if not improving.    Tanna Furry, MD 09/21/13 [phone]

## 2013-09-21 NOTE — Discharge Instructions (Signed)
Back Pain, Adult °Low back pain is very common. About 1 in 5 people have back pain. The cause of low back pain is rarely dangerous. The pain often gets better over time. About half of people with a sudden onset of back pain feel better in just 2 weeks. About 8 in 10 people feel better by 6 weeks.  °CAUSES °Some common causes of back pain include: °· Strain of the muscles or ligaments supporting the spine. °· Wear and tear (degeneration) of the spinal discs. °· Arthritis. °· Direct injury to the back. °DIAGNOSIS °Most of the time, the direct cause of low back pain is not known. However, back pain can be treated effectively even when the exact cause of the pain is unknown. Answering your caregiver's questions about your overall health and symptoms is one of the most accurate ways to make sure the cause of your pain is not dangerous. If your caregiver needs more information, he or she may order lab work or imaging tests (X-rays or MRIs). However, even if imaging tests show changes in your back, this usually does not require surgery. °HOME CARE INSTRUCTIONS °For many people, back pain returns. Since low back pain is rarely dangerous, it is often a condition that people can learn to manage on their own.  °· Remain active. It is stressful on the back to sit or stand in one place. Do not sit, drive, or stand in one place for more than 30 minutes at a time. Take short walks on level surfaces as soon as pain allows. Try to increase the length of time you walk each day. °· Do not stay in bed. Resting more than 1 or 2 days can delay your recovery. °· Do not avoid exercise or work. Your body is made to move. It is not dangerous to be active, even though your back may hurt. Your back will likely heal faster if you return to being active before your pain is gone. °· Pay attention to your body when you  bend and lift. Many people have less discomfort when lifting if they bend their knees, keep the load close to their bodies, and  avoid twisting. Often, the most comfortable positions are those that put less stress on your recovering back. °· Find a comfortable position to sleep. Use a firm mattress and lie on your side with your knees slightly bent. If you lie on your back, put a pillow under your knees. °· Only take over-the-counter or prescription medicines as directed by your caregiver. Over-the-counter medicines to reduce pain and inflammation are often the most helpful. Your caregiver may prescribe muscle relaxant drugs. These medicines help dull your pain so you can more quickly return to your normal activities and healthy exercise. °· Put ice on the injured area. °· Put ice in a plastic bag. °· Place a towel between your skin and the bag. °· Leave the ice on for 15-20 minutes, 03-04 times a day for the first 2 to 3 days. After that, ice and heat may be alternated to reduce pain and spasms. °· Ask your caregiver about trying back exercises and gentle massage. This may be of some benefit. °· Avoid feeling anxious or stressed. Stress increases muscle tension and can worsen back pain. It is important to recognize when you are anxious or stressed and learn ways to manage it. Exercise is a great option. °SEEK MEDICAL CARE IF: °· You have pain that is not relieved with rest or medicine. °· You have pain that does not improve in 1 week. °· You have new symptoms. °· You are generally not feeling well. °SEEK   IMMEDIATE MEDICAL CARE IF:  °· You have pain that radiates from your back into your legs. °· You develop new bowel or bladder control problems. °· You have unusual weakness or numbness in your arms or legs. °· You develop nausea or vomiting. °· You develop abdominal pain. °· You feel faint. °Document Released: 05/01/2005 Document Revised: 10/31/2011 Document Reviewed: 09/19/2010 °ExitCare® Patient Information ©2014 [employer]. ° °Lumbosacral Strain °Lumbosacral strain is a strain of any of the parts that make up your lumbosacral  vertebrae. Your lumbosacral vertebrae are the bones that make up the lower third of your backbone. Your lumbosacral vertebrae are held together by muscles and tough, fibrous tissue (ligaments).  °CAUSES  °A sudden blow to your back can cause lumbosacral strain. Also, anything that causes an excessive stretch of the muscles in the low back can cause this strain. This is typically seen when people exert themselves strenuously, fall, lift heavy objects, bend, or crouch repeatedly. °RISK FACTORS °· Physically demanding work. °· Participation in pushing or pulling sports or sports that require sudden twist of the back (tennis, golf, baseball). °· Weight lifting. °· Excessive lower back curvature. °· Forward-tilted pelvis. °· Weak back or abdominal muscles or both. °· Tight hamstrings. °SIGNS AND SYMPTOMS  °Lumbosacral strain may cause pain in the area of your injury or pain that moves (radiates) down your leg.  °DIAGNOSIS °Your health care provider can often diagnose lumbosacral strain through a physical exam. In some cases, you may need tests such as X-ray exams.  °TREATMENT  °Treatment for your lower back injury depends on many factors that your clinician will have to evaluate. However, most treatment will include the use of anti-inflammatory medicines. °HOME CARE INSTRUCTIONS  °· Avoid hard physical activities (tennis, racquetball, waterskiing) if you are not in proper physical condition for it. This may aggravate or create problems. °· If you have a back problem, avoid sports requiring sudden body movements. Swimming and walking are generally safer activities. °· Maintain good posture. °· Maintain a healthy weight. °· For acute conditions, you may put ice on the injured area. °· Put ice in a plastic bag. °· Place a towel between your skin and the bag. °· Leave the ice on for 20 minutes, 2 3 times a day. °· When the low back starts healing, stretching and strengthening exercises may be recommended. °SEEK MEDICAL CARE  IF: °· Your back pain is getting worse. °· You experience severe back pain not relieved with medicines. °SEEK IMMEDIATE MEDICAL CARE IF:  °· You have numbness, tingling, weakness, or problems with the use of your arms or legs. °· There is a change in bowel or bladder control. °· You have increasing pain in any area of the body, including your belly (abdomen). °· You notice shortness of breath, dizziness, or feel faint. °· You feel sick to your stomach (nauseous), are throwing up (vomiting), or become sweaty. °· You notice discoloration of your toes or legs, or your feet get very cold. °MAKE SURE YOU:  °· Understand these instructions. °· Will watch your condition. °· Will get help right away if you are not doing well or get worse. °Document Released: 02/08/2005 Document Revised: 02/19/2013 Document Reviewed: 12/18/2012 °ExitCare® Patient Information ©2014 [employer]. ° °

## 2013-10-13 ENCOUNTER — Ambulatory Visit (INDEPENDENT_AMBULATORY_CARE_PROVIDER_SITE_OTHER): Payer: Medicare Other | Admitting: Ophthalmology

## 2014-03-31 ENCOUNTER — Other Ambulatory Visit: Payer: Self-pay | Admitting: Internal Medicine

## 2014-03-31 DIAGNOSIS — R911 Solitary pulmonary nodule: Secondary | ICD-10-CM

## 2014-04-13 ENCOUNTER — Inpatient Hospital Stay: Admission: RE | Admit: 2014-04-13 | Payer: Medicare Other | Source: Ambulatory Visit

## 2014-05-11 ENCOUNTER — Ambulatory Visit
Admission: RE | Admit: 2014-05-11 | Discharge: 2014-05-11 | Disposition: A | Discharge status: Home / Self Care | Payer: Medicare Other | Source: Ambulatory Visit | Attending: Internal Medicine | Admitting: Internal Medicine

## 2014-05-11 DIAGNOSIS — R911 Solitary pulmonary nodule: Secondary | ICD-10-CM

## 2014-07-17 ENCOUNTER — Ambulatory Visit: Payer: Self-pay | Admitting: Physician Assistant

## 2014-07-20 ENCOUNTER — Encounter: Payer: Self-pay | Admitting: Physician Assistant

## 2014-07-20 ENCOUNTER — Ambulatory Visit (INDEPENDENT_AMBULATORY_CARE_PROVIDER_SITE_OTHER): Payer: Medicare Other | Admitting: Physician Assistant

## 2014-07-20 VITALS — BP 102/70 | HR 68 | Ht 72.0 in | Wt 222.1 lb

## 2014-07-20 DIAGNOSIS — R011 Cardiac murmur, unspecified: Secondary | ICD-10-CM

## 2014-07-20 DIAGNOSIS — R0609 Other forms of dyspnea: Secondary | ICD-10-CM

## 2014-07-20 DIAGNOSIS — I482 Chronic atrial fibrillation, unspecified: Secondary | ICD-10-CM

## 2014-07-20 DIAGNOSIS — R0600 Dyspnea, unspecified: Secondary | ICD-10-CM

## 2014-07-20 DIAGNOSIS — Z01818 Encounter for other preprocedural examination: Secondary | ICD-10-CM

## 2014-07-20 NOTE — Assessment & Plan Note (Signed)
Patient is here for preoperative clearance before undergoing total knee replacement by [name]. He has history of chronic atrial fibrillation on Coumadin. He had a normal exercise Myoview scan in May 2013 with normal LV function EF 57%. He had moderate mitral regurgitation on 2-D echo at that time. He denies any chest pain or change in his dyspnea on exertion. 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 He remains quite active. Discussed this patient with Dr. Rayann Heman who concurs of patient can proceed with the above surgery without any further cardiac workup. We will order a 2-D echo to follow-up his MR at his convenience. Follow-up with Dr. Algernon Huxley in 6 months.

## 2014-07-20 NOTE — Patient Instructions (Signed)
Your physician recommends that you continue on your current medications as directed. Please refer to the Current Medication list given to you today.    Your physician has requested that you have an echocardiogram. Echocardiography is a painless test that uses sound waves to create images of your heart. It provides your doctor with information about the size and shape of your heart and how well your heart's chambers and valves are working. This procedure takes approximately one hour. There are no restrictions for this procedure.   FOLLOW UP DR [facility] IN 6 MONTHS

## 2014-07-20 NOTE — Progress Notes (Signed)
Cardiology Office Note   Date:  07/20/2014   ID:  [name] 14-Feb-1946, MRN [id number]  PCP:  Sean Lopes, MD  Cardiologist: Dr. Aundra Benitez  Chief Complaint: Presurgical clearance    History of Present Illness: [name] is a 69 y.o. male who presents for presurgical clearance for total knee replacement by Dr. Tonita Benitez. He has a history of chronic atrial fibrillation that has recurred after multiple cardioversions. He is maintained on Coumadin. He had a normal Lexi scan Myoview in 2013 EF 57%. 2-D echo in 2013 showed normal LV function with moderate MR. These tests were performed for dyspnea on exertion. He also has OSA on CPAP, hypertension, hyperlipidemia, obesity. CT of chest 05/11/14 showed stable pulmonary nodules the largest measuring 6 mm. Repeat recommended in one year.  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 He is still doing a lot of work on his own home and added a room and recently and has done a lot of remodeling. He denies any exertional chest pain, tightness, palpitations, and dyspnea, dizziness or presyncope. If he is in a hurry or going up a hill he sometimes will get out of breath but nothing out of the ordinary. He remains on Coumadin that is managed by [name]. He is had no changes in his past medical history since his last visit here.    Past Medical History  Diagnosis Date  . Palpitations   . Arrhythmia   . Prostate cancer     seed implantion  . Diabetes mellitus   . Hypercholesteremia   . Sleep apnea     uses CPAP  . Hypertension   . Depression     Past Surgical History  Procedure Laterality Date  . Seed implants    . Cystoscopy w/ retrogrades  04/04/2012    Procedure: CYSTOSCOPY WITH RETROGRADE PYELOGRAM;  Surgeon: Molli Hazard, MD;  Location: [facility];  Service: Urology;  Laterality: Bilateral;  . Cystoscopy with retrograde pyelogram, ureteroscopy and stent placement  04/04/2012    Procedure: CYSTOSCOPY  WITH RETROGRADE PYELOGRAM, URETEROSCOPY AND STENT PLACEMENT;  Surgeon: Molli Hazard, MD;  Location: [facility];  Service: Urology;  Laterality: Right;     Current Outpatient Prescriptions  Medication Sig Dispense Refill  . ALLOPURINOL PO Take 300 mg by mouth every morning.     Marland Kitchen atenolol (TENORMIN) 25 MG tablet Take 25 mg by mouth every morning.    Marland Kitchen atorvastatin (LIPITOR) 10 MG tablet Take 10 mg by mouth every evening.     . bismuth subsalicylate (PEPTO BISMOL) 262 MG/15ML suspension Take 30 mLs by mouth every 6 (six) hours as needed for indigestion.    Marland Kitchen HYDROcodone-acetaminophen (NORCO/VICODIN) 5-325 MG per tablet Take 2 tablets by mouth every 4 (four) hours as needed. 10 tablet 0  . LISINOPRIL PO Take 2.5 mg by mouth every morning.     . metFORMIN (GLUCOPHAGE) 500 MG tablet Take 500 mg by mouth every morning.     . methocarbamol (ROBAXIN) 500 MG tablet Take 1 tablet (500 mg total) by mouth 2 (two) times daily. 20 tablet 0  . Multiple Vitamin (MULTIVITAMIN PO) Take by mouth daily.     Marland Kitchen senna-docusate (SENOKOT S) 8.6-50 MG per tablet Take 1 tablet by mouth 2 (two) times daily. 60 tablet 0  . traMADol (ULTRAM) 50 MG tablet Take 2 tablets by mouth every morning.    . warfarin (COUMADIN) 4 MG tablet Take 1-1.5 tablets by mouth See admin  instructions. 1 tab (4mg ) on Tues, Thurs, Sat, Sun and 1.5 tab (6mg ) Mon, Wed, Fri     No current facility-administered medications for this visit.    Allergies:   Sulfa drugs cross reactors; Tetracyclines & related; and Contrast media    Social History:  The patient  reports that he quit smoking about 41 years ago. He does not have any smokeless tobacco history on file. He reports that he drinks alcohol. He reports that he does not use illicit drugs.   Family History:  The patient's family history includes Cancer in his mother; Heart attack in his sister; Stroke in his sister.    ROS:  Please see the history of present illness.   Otherwise, review  of systems are positive for none.   All other systems are reviewed and negative.    PHYSICAL EXAM: BP 102/70 mmHg  Pulse 68  Ht 6' (1.829 m)  Wt 222 lb 1.9 oz (100.753 kg)  BMI 30.12 kg/m2 GEN: Well nourished, well developed, in no acute distress HEENT: normal Neck: no JVD, HJR, carotid bruits, or masses Cardiac: RRR; 1 to 2/6 systolic murmur at the apex, no gallop rubs, thrill or heave,no edema,   Respiratory:  clear to auscultation bilaterally, normal work of breathing GI: soft, nontender, nondistended, + BS MS: no deformity or atrophy Extremities: without cyanosis, clubbing, edema, good distal pulses bilaterally.  Skin: warm and dry, no rash Neuro:  Strength and sensation are intact Psych: euthymic mood, full affect   EKG:  EKG is ordered today. The ekg ordered today demonstrates atrial fibrillation poor R wave progression, no acute change from last EKG in 02/2012.  Recent Labs: 09/21/2013: ALT 22; BUN 25*; Creatinine 1.36*; Hemoglobin 14.9; Platelets 136*; Potassium 4.1; Sodium 137    Lipid Panel No results found for: CHOL, TRIG, HDL, CHOLHDL, VLDL, LDLCALC, LDLDIRECT    Wt Readings from Last 3 Encounters:  02/28/12 225 lb (102.059 kg)  10/11/11 220 lb (99.791 kg)  10/02/11 225 lb 1.9 oz (102.114 kg)      Other studies Reviewed: Additional studies/ records that were reviewed today include and review of the records demonstrates:  Study Conclusions  - Left ventricle: The cavity size was normal. There was mild   focal basal hypertrophy of the septum. Systolic function   was normal. The estimated ejection fraction was in the   range of 55% to 60%. Wall motion was normal; there were no   regional wall motion abnormalities. The study is not   technically sufficient to allow evaluation of LV diastolic   function. - Aortic valve: Trivial regurgitation. - Mitral valve: Moderate regurgitation. - Left atrium: The atrium was moderately dilated. - Right ventricle: The  cavity size was mildly dilated. - Right atrium: The atrium was mildly dilated. - Pulmonary arteries: Systolic pressure was mildly   increased. PA peak pressure: 43mm Hg (S). Overall Impression:  Normal stress nuclear study.  LV Ejection Fraction: 57%.  LV Wall Motion:  NL LV Function; NL Wall Motion  Dalton McLean  Normal study  Loralie Champagne 10/12/2011 11:09 AM  CT of chest 05/11/14 IMPRESSION: Stable pulmonary nodules, with the largest measuring 6 mm. Another follow-up in 12 months will confirm benign etiology in this former cigarette smoker. This recommendation follows the consensus statement: Guidelines for Management of Small Pulmonary Nodules Detected on CT Scans: A Statement from the [employer] as published in Radiology 2005; 237:395-400.     Electronically Signed   By: Arvilla Market.D.  On: 05/11/2014 13:18   ASSESSMENT AND PLAN:  Preoperative clearance Patient is here for preoperative clearance before undergoing total knee replacement by Dr. Tonita Benitez. He has history of chronic atrial fibrillation on Coumadin. He had a normal exercise Myoview scan in May 2013 with normal LV function EF 57%. He had moderate mitral regurgitation on 2-D echo at that time. He denies any chest pain or change in his dyspnea on exertion. 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 He remains quite active. Discussed this patient with Dr. Rayann Heman who concurs of patient can proceed with the above surgery without any further cardiac workup. We will order a 2-D echo to follow-up his MR at his convenience. Follow-up with Dr. Algernon Huxley in 6 months.   Atrial fibrillation Patient has chronic atrial fibrillation and Coumadin is managed by [name]. His rate is controlled with Tenormin. Follow-up with Dr. Algernon Huxley.   Exertional dyspnea Exertional dyspnea has not changed over the years. He does have history of pulmonary nodules with the largest  measuring 6 mm. CT scan in 04/2014 showed that these were stable and repeat in 1 year.     Sumner Boast, PA-C  07/20/2014 8:34 AM    [facility] [address], [place], [place]  [address] Phone: [phone]; Fax: [phone]

## 2014-07-20 NOTE — Assessment & Plan Note (Addendum)
Patient has chronic atrial fibrillation and Coumadin is managed by Dr. Sharlett Iles. His rate is controlled with Tenormin.CHADVASC is 3 for age, HTN, DM. Follow-up with Dr. Algernon Huxley.

## 2014-07-20 NOTE — Assessment & Plan Note (Signed)
Exertional dyspnea has not changed over the years. He does have history of pulmonary nodules with the largest measuring 6 mm. CT scan in 04/2014 showed that these were stable and repeat in 1 year.

## 2014-07-22 ENCOUNTER — Ambulatory Visit: Payer: Self-pay | Admitting: Orthopedic Surgery

## 2014-07-24 ENCOUNTER — Ambulatory Visit (HOSPITAL_COMMUNITY): Payer: Medicare Other | Attending: Physician Assistant | Admitting: Radiology

## 2014-07-24 DIAGNOSIS — I10 Essential (primary) hypertension: Secondary | ICD-10-CM | POA: Diagnosis not present

## 2014-07-24 DIAGNOSIS — R011 Cardiac murmur, unspecified: Secondary | ICD-10-CM | POA: Diagnosis present

## 2014-07-24 DIAGNOSIS — E785 Hyperlipidemia, unspecified: Secondary | ICD-10-CM | POA: Diagnosis not present

## 2014-07-24 DIAGNOSIS — E119 Type 2 diabetes mellitus without complications: Secondary | ICD-10-CM | POA: Diagnosis not present

## 2014-07-24 NOTE — Progress Notes (Signed)
Echocardiogram performed.  

## 2014-07-26 ENCOUNTER — Encounter: Payer: Self-pay | Admitting: Physician Assistant

## 2014-07-28 NOTE — Patient Instructions (Addendum)
Toast  07/28/2014   Your procedure is scheduled on:   08-06-2014 Thursday  Enter through [place] and follow signs to [facility]. Arrive at 0800       AM .  Call this number if you have problems the morning of surgery: [phone]  Or Presurgical Testing [phone].   For Living Will and/or Health Care Power Attorney Forms: please provide copy for your medical record,may bring AM of surgery(Forms should be already notarized -we do not provide this service).(07-29-14  No, information provided today per request).  Remember: Follow any bowel prep instructions per MD office. For Cpap use: Bring mask and tubing only.   Do not eat food/ or drink: After Midnight.      Take these medicines the morning of surgery with A SIP OF WATER: Amlodipine.  Atenolol. Tramadol as need.   Do not wear jewelry, make-up or nail polish.  Do not wear deodorant, lotions, powders, or perfumes.   Do not shave legs and under arms- 48 hours(2 days) prior to first CHG shower.(Shaving face and neck okay.)  Do not bring valuables to the hospital.(Hospital is not responsible for lost valuables).  Contacts, dentures or removable bridgework, body piercing, hair pins may not be worn into surgery.  Leave suitcase in the car. After surgery it may be brought to your room.  For patients admitted to the hospital, checkout time is 11:00 AM the day of discharge.(Restricted visitors-Any Persons displaying flu-like symptoms or illness).    Patients discharged the day of surgery will not be allowed to drive home. Must have responsible person with you x 24 hours once discharged.  Name and phone number of your driver: Jaynie Crumble [phone] cell/ [phone] h     Please read over the following fact sheets that you were given:  CHG(Chlorhexidine Gluconate 4% Surgical Soap) use, MRSA Information, Blood Transfusion fact sheet, Incentive Spirometry Instruction.  Remember : Type/Screen "Blue  armbands" - may not be removed once applied(would result in being retested AM of surgery, if removed).         [facility] - Preparing for Surgery Before surgery, you can play an important role.  Because skin is not sterile, your skin needs to be as free of germs as possible.  You can reduce the number of germs on your skin by washing with CHG (chlorahexidine gluconate) soap before surgery.  CHG is an antiseptic cleaner which kills germs and bonds with the skin to continue killing germs even after washing. Please DO NOT use if you have an allergy to CHG or antibacterial soaps.  If your skin becomes reddened/irritated stop using the CHG and inform your nurse when you arrive at Short Stay. Do not shave (including legs and underarms) for at least 48 hours prior to the first CHG shower.  You may shave your face/neck. Please follow these instructions carefully:  1.  Shower with CHG Soap the night before surgery and the  morning of Surgery.  2.  If you choose to wash your hair, wash your hair first as usual with your  normal  shampoo.  3.  After you shampoo, rinse your hair and body thoroughly to remove the  shampoo.                           4.  Use CHG as you would any other liquid soap.  You can apply chg directly  to the  skin and wash                       Gently with a scrungie or clean washcloth.  5.  Apply the CHG Soap to your body ONLY FROM THE NECK DOWN.   Do not use on face/ open                           Wound or open sores. Avoid contact with eyes, ears mouth and genitals (private parts).                       Wash face,  Genitals (private parts) with your normal soap.             6.  Wash thoroughly, paying special attention to the area where your surgery  will be performed.  7.  Thoroughly rinse your body with warm water from the neck down.  8.  DO NOT shower/wash with your normal soap after using and rinsing off  the CHG Soap.                9.  Pat yourself dry with a clean towel.             10.  Wear clean pajamas.            11.  Place clean sheets on your bed the night of your first shower and do not  sleep with pets. Day of Surgery : Do not apply any lotions/deodorants the morning of surgery.  Please wear clean clothes to the hospital/surgery center.  FAILURE TO FOLLOW THESE INSTRUCTIONS MAY RESULT IN THE CANCELLATION OF YOUR SURGERY PATIENT SIGNATURE_________________________________  NURSE SIGNATURE__________________________________  ________________________________________________________________________   Adam Phenix  An incentive spirometer is a tool that can help keep your lungs clear and active. This tool measures how well you are filling your lungs with each breath. Taking long deep breaths may help reverse or decrease the chance of developing breathing (pulmonary) problems (especially infection) following:  A long period of time when you are unable to move or be active. BEFORE THE PROCEDURE   If the spirometer includes an indicator to show your best effort, your nurse or respiratory therapist will set it to a desired goal.  If possible, sit up straight or lean slightly forward. Try not to slouch.  Hold the incentive spirometer in an upright position. INSTRUCTIONS FOR USE  1. Sit on the edge of your bed if possible, or sit up as far as you can in bed or on a chair. 2. Hold the incentive spirometer in an upright position. 3. Breathe out normally. 4. Place the mouthpiece in your mouth and seal your lips tightly around it. 5. Breathe in slowly and as deeply as possible, raising the piston or the ball toward the top of the column. 6. Hold your breath for 3-5 seconds or for as long as possible. Allow the piston or ball to fall to the bottom of the column. 7. Remove the mouthpiece from your mouth and breathe out normally. 8. Rest for a few seconds and repeat Steps 1 through 7 at least 10 times every 1-2 hours when you are awake. Take your time and  take a few normal breaths between deep breaths. 9. The spirometer may include an indicator to show your best effort. Use the indicator as a goal to work toward during each repetition. 10. After each set  of 10 deep breaths, practice coughing to be sure your lungs are clear. If you have an incision (the cut made at the time of surgery), support your incision when coughing by placing a pillow or rolled up towels firmly against it. Once you are able to get out of bed, walk around indoors and cough well. You may stop using the incentive spirometer when instructed by your caregiver.  RISKS AND COMPLICATIONS  Take your time so you do not get dizzy or light-headed.  If you are in pain, you may need to take or ask for pain medication before doing incentive spirometry. It is harder to take a deep breath if you are having pain. AFTER USE  Rest and breathe slowly and easily.  It can be helpful to keep track of a log of your progress. Your caregiver can provide you with a simple table to help with this. If you are using the spirometer at home, follow these instructions: Gas IF:   You are having difficultly using the spirometer.  You have trouble using the spirometer as often as instructed.  Your pain medication is not giving enough relief while using the spirometer.  You develop fever of 100.5 F (38.1 C) or higher. SEEK IMMEDIATE MEDICAL CARE IF:   You cough up bloody sputum that had not been present before.  You develop fever of 102 F (38.9 C) or greater.  You develop worsening pain at or near the incision site. MAKE SURE YOU:   Understand these instructions.  Will watch your condition.  Will get help right away if you are not doing well or get worse. Document Released: 09/11/2006 Document Revised: 07/24/2011 Document Reviewed: 11/12/2006 [facility] Patient Information 2014 [place], [place].   ________________________________________________________________________

## 2014-07-29 ENCOUNTER — Encounter (HOSPITAL_COMMUNITY): Payer: Self-pay

## 2014-07-29 ENCOUNTER — Ambulatory Visit: Payer: Self-pay | Admitting: Orthopedic Surgery

## 2014-07-29 ENCOUNTER — Encounter (HOSPITAL_COMMUNITY)
Admission: RE | Admit: 2014-07-29 | Discharge: 2014-07-29 | Disposition: A | Discharge status: Home / Self Care | Payer: Medicare Other | Source: Ambulatory Visit | Attending: Specialist | Admitting: Specialist

## 2014-07-29 ENCOUNTER — Ambulatory Visit (HOSPITAL_COMMUNITY)
Admission: RE | Admit: 2014-07-29 | Discharge: 2014-07-29 | Disposition: A | Discharge status: Home / Self Care | Payer: Medicare Other | Source: Ambulatory Visit | Attending: Orthopedic Surgery | Admitting: Orthopedic Surgery

## 2014-07-29 DIAGNOSIS — M1712 Unilateral primary osteoarthritis, left knee: Secondary | ICD-10-CM

## 2014-07-29 DIAGNOSIS — Z01818 Encounter for other preprocedural examination: Secondary | ICD-10-CM | POA: Diagnosis present

## 2014-07-29 DIAGNOSIS — M25462 Effusion, left knee: Secondary | ICD-10-CM | POA: Diagnosis not present

## 2014-07-29 HISTORY — DX: Crossing vessel and stricture of ureter without hydronephrosis: N13.5

## 2014-07-29 HISTORY — DX: Cardiac murmur, unspecified: R01.1

## 2014-07-29 LAB — BASIC METABOLIC PANEL
Anion gap: 9 (ref 5–15)
BUN: 20 mg/dL (ref 6–23)
CO2: 28 mmol/L (ref 19–32)
Calcium: 9.1 mg/dL (ref 8.4–10.5)
Chloride: 104 mmol/L (ref 96–112)
Creatinine, Ser: 1.11 mg/dL (ref 0.50–1.35)
GFR calc Af Amer: 76 mL/min — ABNORMAL LOW (ref >90)
GFR calc non Af Amer: 65 mL/min — ABNORMAL LOW (ref >90)
Glucose, Bld: 83 mg/dL (ref 70–99)
Potassium: 3.9 mmol/L (ref 3.5–5.1)
Sodium: 141 mmol/L (ref 135–145)

## 2014-07-29 LAB — URINALYSIS, ROUTINE W REFLEX MICROSCOPIC
Bilirubin Urine: NEGATIVE
Glucose, UA: NEGATIVE mg/dL
Hgb urine dipstick: NEGATIVE
Ketones, ur: NEGATIVE mg/dL
Leukocytes, UA: NEGATIVE
Nitrite: NEGATIVE
Protein, ur: NEGATIVE mg/dL
Specific Gravity, Urine: 1.023 (ref 1.005–1.030)
Urobilinogen, UA: 1 mg/dL (ref 0.0–1.0)
pH: 5.5 (ref 5.0–8.0)

## 2014-07-29 LAB — CBC
HCT: 42.7 % (ref 39.0–52.0)
Hemoglobin: 14.3 g/dL (ref 13.0–17.0)
MCH: 31.1 pg (ref 26.0–34.0)
MCHC: 33.5 g/dL (ref 30.0–36.0)
MCV: 92.8 fL (ref 78.0–100.0)
Platelets: 148 10*3/uL — ABNORMAL LOW (ref 150–400)
RBC: 4.6 MIL/uL (ref 4.22–5.81)
RDW: 13.2 % (ref 11.5–15.5)
WBC: 5.2 10*3/uL (ref 4.0–10.5)

## 2014-07-29 LAB — PROTIME-INR
INR: 2.01 — ABNORMAL HIGH (ref 0.00–1.49)
Prothrombin Time: 22.9 seconds — ABNORMAL HIGH (ref 11.6–15.2)

## 2014-07-29 LAB — ABO/RH: ABO/RH(D): AB POS

## 2014-07-29 LAB — SURGICAL PCR SCREEN
MRSA, PCR: NEGATIVE
Staphylococcus aureus: POSITIVE — AB

## 2014-07-29 NOTE — Pre-Procedure Instructions (Signed)
07-29-14 Pt. Notified of Positive Staph aureus- Rx for Mupirocin called to [facility] -pt. Aware to use as directed.

## 2014-07-29 NOTE — H&P (Signed)
[name] DOB: 1946-02-17 Married / Language: English / Race: White Male  H&P Date: 07/29/14  Chief Complaint: Left knee pain  History of Present Illness The patient is a 69 year old male who comes in today for a preoperative History and Physical. The patient is scheduled for a left total knee arthroplasty to be performed by [name], MD at [facility] on 08/06/2014. [name] is several months out from his most recent flareup with ongoing L knee pain, swelling stiffness due to DJD refractory to aspirations, steroid injections, bracing, activity modifications, quad strengthening, HEP, relative rest, medications.  [name] and the patient mutually agreed to proceed with a total knee replacement. Risks and benefits of the procedure were discussed including stiffness, suboptimal range of motion, persistent pain, infection requiring removal of prosthesis and reinsertion, need for prophylactic antibiotics in the future, for example, dental procedures, possible need for manipulation, revision in the future and also anesthetic complications including DVT, PE, etc. We discussed the perioperative course, time in the hospital, postoperative recovery and the need for elevation to control swelling. We also discussed the predicted range of motion and the probability that squatting and kneeling would be unobtainable in the future. In addition, postoperative anticoagulation was discussed. We have obtained preoperative medical clearance as necessary- [name], [name]. Provided illustrated handout and discussed it in detail. They will enroll in the total joint replacement educational forum at the hospital.  He had his [facility] pre-op appt this morning. He is on coumadin for A.Fib and was directed to hold x 5 days pre-op. He has been off coumadin previously for ESI's and has never had to bridge with Lovenox or any other meds. He sees [name] for pain mgmt, injection therapy only, is not on  chronic pain meds, and does have a lumbar bulging disc. He does feel some pain in his back right now and this is around the time he is due for another injection. He is feeling well otherwise.  Allergies  SulfADIAZINE *SULFONAMIDES* Tetracycline HCl *TETRACYCLINES* SULFA11/16/2001 (Marked as Inactive) TETRACYCLINE11/16/2001 (Marked as Inactive)  Family History  Congestive Heart Failure Father. First Degree Relatives  Social History Marital status widowed Exercise Exercises weekly; does running / walking Tobacco / smoke exposure no Tobacco use former smoker  Past Surgical History  Straighten Nasal Septum Other Orthopaedic Surgery shoulder surgery  Past Medical Hx Diabetes Mellitus, Type II Sleep Apnea  Review of Systems  General Not Present- Chills, Fatigue, Fever, Memory Loss, Night Sweats, Weight Gain and Weight Loss. Skin Not Present- Eczema, Hives, Itching, Lesions and Rash. HEENT Not Present- Dentures, Double Vision, Headache, Hearing Loss, Tinnitus and Visual Loss. Respiratory Not Present- Allergies, Chronic Cough, Coughing up blood, Shortness of breath at rest and Shortness of breath with exertion. Cardiovascular Not Present- Chest Pain, Difficulty Breathing Lying Down, Murmur, Palpitations, Racing/skipping heartbeats and Swelling. Gastrointestinal Not Present- Abdominal Pain, Bloody Stool, Constipation, Diarrhea, Difficulty Swallowing, Heartburn, Jaundice, Loss of appetitie, Nausea and Vomiting. Male Genitourinary Not Present- Blood in Urine, Discharge, Flank Pain, Incontinence, Painful Urination, Urgency, Urinary frequency, Urinary Retention, Urinating at Night and Weak urinary stream. Musculoskeletal Present- Back Pain, Joint Pain, Joint Swelling, Morning Stiffness and Muscle Pain. Not Present- Muscle Weakness and Spasms. Neurological Not Present- Blackout spells, Difficulty with balance, Dizziness, Paralysis, Tremor and Weakness. Psychiatric Not Present-  Insomnia.  Vitals  07/29/2014 1:46 PM Weight: 218 lb Height: 72in Body Surface Area: 2.21 m Body Mass Index: 29.57 kg/m  Pulse: 75 (Regular)  BP: 128/80 (Sitting, Left Arm, Standard)  Physical Exam  General Mental Status -Alert, cooperative and good historian. General Appearance-pleasant, Not in acute distress. Orientation-Oriented X3. Build & Nutrition-Well nourished and Well developed. Gait-Antalgic.  Head and Neck Head-normocephalic, atraumatic . Neck Global Assessment - supple, no bruit auscultated on the right, no bruit auscultated on the left.  Eye Pupil - Bilateral-Regular and Round. Motion - Bilateral-EOMI.  Chest and Lung Exam Auscultation Breath sounds - clear at anterior chest wall and clear at posterior chest wall. Adventitious sounds - No Adventitious sounds.  Cardiovascular Auscultation Rhythm - Regular rate and rhythm. Heart Sounds - S1 WNL and S2 WNL. Murmurs & Other Heart Sounds - Auscultation of the heart reveals - No Murmurs.  Abdomen Palpation/Percussion Tenderness - Abdomen is non-tender to palpation. Rigidity (guarding) - Abdomen is soft. Auscultation Auscultation of the abdomen reveals - Bowel sounds normal.  Male Genitourinary Note: Not done, not pertinent to present illness  Musculoskeletal Note: Knee exam on inspection reveals no evidence of ecchymosis, deformity or erythema. He is tender in the medial joint line. Patellofemoral pain with compression. Trace effusion. Limited extension and flexion. Nontender over the fibular head or the peroneal nerve. Nontender over the quadriceps insertion of the patellar ligament insertion. Provocative maneuvers revealed a negative Lachman, negative anterior and posterior drawer and a negative McMurray. No instability was noted with varus and valgus stressing at 0 or 30 degrees. On manual motor test the quadriceps and hamstrings were 5/5. Sensory exam was intact to light  touch.  Imaging prior xrays reviewed. AP standing and lateral demonstrates no fracture. Bone on bone arthrosis.  Assessment & Plan  Primary osteoarthritis of left knee (M17.12)  Pt with end-stage L knee DJD, bone-on-bone, refractory to conservative tx, scheduled for L total knee replacement by [name] on 08/06/14. We again discussed the procedure itself as well as risks, complications and alternatives, including but not limited to DVT, PE, infx, bleeding, failure of procedure, need for secondary procedure including manipulation, nerve injury, ongoing pain/symptoms, anesthesia risk, even stroke or death. Also discussed typical post-op protocols, activity restrictions, need for PT, flexion/extension exercises, time out of work. Discussed need for DVT ppx post-op, he is on chronic coumadin due to A.Fib, no recent tachycardia or arrhythmias. He has been cleared to hold coumadin x 5 days. Discussed dental ppx. Also discussed limitations post-operatively such as kneeling and squatting. All questions were answered. Patient desires to proceed with surgery as scheduled. Will hold vitamins accordingly. He has been directed to hold coumadin x 5 days pre-op. Will have him take ASA 81mg  daily while off coumadin leading up to surgery for ppx then restart coumadin post-op and possibly start lovenox as well until coumadin is therapeutic. Will remain NPO after MN night before surgery and hold metformin morning of surgery as he will be NPO. Plan pain medication, Colace. Plan home with HHPT post-op with family members at home for assistance then to outpatient PT. Would recommend waiting at least 3-4 weeks post-op from surgery to consider ESI and would not recommend at this point as it would weaken his immune reponse and can make him hyperglycemic. Will follow up 10-14 days post-op for staple removal and xrays. He will call with any questions or concerns in the interim.  Plan left total knee replacement  Signed  electronically by Lacie Draft, PA-C for [name]

## 2014-07-29 NOTE — Pre-Procedure Instructions (Signed)
3-16 -16 EKG, Echo 3'16 Epic. CT Chest 12'15 Epic. Right Knee xray done per MD order.

## 2014-07-30 ENCOUNTER — Other Ambulatory Visit: Payer: Self-pay | Admitting: Orthopedic Surgery

## 2014-08-06 ENCOUNTER — Encounter (HOSPITAL_COMMUNITY): Payer: Self-pay

## 2014-08-06 ENCOUNTER — Inpatient Hospital Stay (HOSPITAL_COMMUNITY): Payer: Medicare Other | Admitting: Anesthesiology

## 2014-08-06 ENCOUNTER — Inpatient Hospital Stay (HOSPITAL_COMMUNITY): Payer: Medicare Other

## 2014-08-06 ENCOUNTER — Encounter (HOSPITAL_COMMUNITY)
Admission: RE | Disposition: A | Discharge status: Home Health | Payer: Self-pay | Source: Ambulatory Visit | Attending: Specialist

## 2014-08-06 ENCOUNTER — Inpatient Hospital Stay (HOSPITAL_COMMUNITY)
Admission: RE | Admit: 2014-08-06 | Discharge: 2014-08-10 | DRG: 470 | Disposition: A | Discharge status: Home Health | Payer: Medicare Other | Source: Ambulatory Visit | Attending: Specialist | Admitting: Specialist

## 2014-08-06 DIAGNOSIS — Z8546 Personal history of malignant neoplasm of prostate: Secondary | ICD-10-CM

## 2014-08-06 DIAGNOSIS — G4730 Sleep apnea, unspecified: Secondary | ICD-10-CM | POA: Diagnosis present

## 2014-08-06 DIAGNOSIS — E119 Type 2 diabetes mellitus without complications: Secondary | ICD-10-CM | POA: Diagnosis present

## 2014-08-06 DIAGNOSIS — M1712 Unilateral primary osteoarthritis, left knee: Secondary | ICD-10-CM | POA: Diagnosis present

## 2014-08-06 DIAGNOSIS — I10 Essential (primary) hypertension: Secondary | ICD-10-CM | POA: Diagnosis present

## 2014-08-06 DIAGNOSIS — E780 Pure hypercholesterolemia: Secondary | ICD-10-CM | POA: Diagnosis present

## 2014-08-06 DIAGNOSIS — Z7901 Long term (current) use of anticoagulants: Secondary | ICD-10-CM

## 2014-08-06 DIAGNOSIS — R42 Dizziness and giddiness: Secondary | ICD-10-CM | POA: Diagnosis not present

## 2014-08-06 DIAGNOSIS — M25562 Pain in left knee: Secondary | ICD-10-CM | POA: Diagnosis present

## 2014-08-06 DIAGNOSIS — Z96652 Presence of left artificial knee joint: Secondary | ICD-10-CM

## 2014-08-06 DIAGNOSIS — I4891 Unspecified atrial fibrillation: Secondary | ICD-10-CM | POA: Diagnosis present

## 2014-08-06 DIAGNOSIS — Z87891 Personal history of nicotine dependence: Secondary | ICD-10-CM | POA: Diagnosis not present

## 2014-08-06 HISTORY — PX: TOTAL KNEE ARTHROPLASTY: SHX125

## 2014-08-06 LAB — CBC
HCT: 39.9 % (ref 39.0–52.0)
Hemoglobin: 13 g/dL (ref 13.0–17.0)
MCH: 30.6 pg (ref 26.0–34.0)
MCHC: 32.6 g/dL (ref 30.0–36.0)
MCV: 93.9 fL (ref 78.0–100.0)
Platelets: 118 10*3/uL — ABNORMAL LOW (ref 150–400)
RBC: 4.25 MIL/uL (ref 4.22–5.81)
RDW: 13.3 % (ref 11.5–15.5)
WBC: 4.9 10*3/uL (ref 4.0–10.5)

## 2014-08-06 LAB — PROTIME-INR
INR: 1.22 (ref 0.00–1.49)
Prothrombin Time: 15.5 seconds — ABNORMAL HIGH (ref 11.6–15.2)

## 2014-08-06 LAB — TYPE AND SCREEN
ABO/RH(D): AB POS
Antibody Screen: NEGATIVE

## 2014-08-06 LAB — GLUCOSE, CAPILLARY
Glucose-Capillary: 98 mg/dL (ref 70–99)
Glucose-Capillary: 97 mg/dL (ref 70–99)
Glucose-Capillary: 91 mg/dL (ref 70–99)

## 2014-08-06 LAB — CREATININE, SERUM
Creatinine, Ser: 1.14 mg/dL (ref 0.50–1.35)
GFR calc Af Amer: 73 mL/min — ABNORMAL LOW (ref >90)
GFR calc non Af Amer: 63 mL/min — ABNORMAL LOW (ref >90)

## 2014-08-06 SURGERY — ARTHROPLASTY, KNEE, TOTAL
Anesthesia: Spinal | Site: Knee | Laterality: Left

## 2014-08-06 MED ORDER — METOCLOPRAMIDE HCL 5 MG/ML IJ SOLN
INTRAMUSCULAR | Status: DC | PRN
  Administered 2014-08-06: 10 mg via INTRAVENOUS

## 2014-08-06 MED ORDER — KETAMINE HCL 10 MG/ML IJ SOLN
INTRAMUSCULAR | Status: DC | PRN
  Administered 2014-08-06 (×2): 10 mg via INTRAVENOUS
  Administered 2014-08-06 (×2): 20 mg via INTRAVENOUS

## 2014-08-06 MED ORDER — SODIUM CHLORIDE 0.9 % IR SOLN
Status: DC | PRN
  Administered 2014-08-06: 500 mL

## 2014-08-06 MED ORDER — EPHEDRINE SULFATE 50 MG/ML IJ SOLN
INTRAMUSCULAR | Status: AC
  Filled 2014-08-06: qty 1

## 2014-08-06 MED ORDER — BISACODYL 5 MG PO TBEC: 5.0000 mg | DELAYED_RELEASE_TABLET | Freq: Every day | ORAL | Status: DC | PRN

## 2014-08-06 MED ORDER — SODIUM CHLORIDE 0.9 % IR SOLN
Status: AC
  Filled 2014-08-06: qty 1

## 2014-08-06 MED ORDER — PROPOFOL 10 MG/ML IV BOLUS
INTRAVENOUS | Status: AC
  Filled 2014-08-06: qty 20

## 2014-08-06 MED ORDER — MENTHOL 3 MG MT LOZG: 1.0000 | LOZENGE | OROMUCOSAL | Status: DC | PRN

## 2014-08-06 MED ORDER — HYDROMORPHONE HCL 1 MG/ML IJ SOLN: 0.2500 mg | INTRAMUSCULAR | Status: DC | PRN

## 2014-08-06 MED ORDER — GLYCOPYRROLATE 0.2 MG/ML IJ SOLN
INTRAMUSCULAR | Status: DC | PRN
  Administered 2014-08-06 (×2): 0.1 mg via INTRAVENOUS

## 2014-08-06 MED ORDER — ALLOPURINOL 300 MG PO TABS
300.0000 mg | ORAL_TABLET | Freq: Every day | ORAL | Status: DC
  Administered 2014-08-07 – 2014-08-10 (×4): 300 mg via ORAL
  Filled 2014-08-06 (×4): qty 1

## 2014-08-06 MED ORDER — POTASSIUM CHLORIDE IN NACL 20-0.45 MEQ/L-% IV SOLN
INTRAVENOUS | Status: AC
  Administered 2014-08-06 – 2014-08-07 (×2): via INTRAVENOUS
  Filled 2014-08-06 (×2): qty 1000

## 2014-08-06 MED ORDER — OCUVITE-LUTEIN PO CAPS
1.0000 | ORAL_CAPSULE | Freq: Every day | ORAL | Status: DC
  Administered 2014-08-07 – 2014-08-09 (×3): 1 via ORAL
  Filled 2014-08-06 (×5): qty 1

## 2014-08-06 MED ORDER — CEFAZOLIN SODIUM-DEXTROSE 2-3 GM-% IV SOLR
2.0000 g | Freq: Four times a day (QID) | INTRAVENOUS | Status: AC
  Administered 2014-08-06 – 2014-08-07 (×3): 2 g via INTRAVENOUS
  Filled 2014-08-06 (×3): qty 50

## 2014-08-06 MED ORDER — BUPIVACAINE IN DEXTROSE 0.75-8.25 % IT SOLN
INTRATHECAL | Status: DC | PRN
  Administered 2014-08-06: 15 mg via INTRATHECAL

## 2014-08-06 MED ORDER — PROPOFOL 10 MG/ML IV BOLUS
INTRAVENOUS | Status: DC | PRN
  Administered 2014-08-06: 30 mg via INTRAVENOUS
  Administered 2014-08-06: 20 mg via INTRAVENOUS
  Administered 2014-08-06: 30 mg via INTRAVENOUS
  Administered 2014-08-06: 20 mg via INTRAVENOUS

## 2014-08-06 MED ORDER — WARFARIN - PHARMACIST DOSING INPATIENT: Freq: Every day | Status: DC

## 2014-08-06 MED ORDER — METOCLOPRAMIDE HCL 10 MG PO TABS
5.0000 mg | ORAL_TABLET | Freq: Three times a day (TID) | ORAL | Status: DC | PRN
  Administered 2014-08-07: 10 mg via ORAL
  Filled 2014-08-06: qty 1

## 2014-08-06 MED ORDER — METHOCARBAMOL 1000 MG/10ML IJ SOLN
500.0000 mg | Freq: Four times a day (QID) | INTRAVENOUS | Status: DC | PRN
  Filled 2014-08-06: qty 5

## 2014-08-06 MED ORDER — DOCUSATE SODIUM 100 MG PO CAPS: 100.0000 mg | ORAL_CAPSULE | Freq: Two times a day (BID) | ORAL | Status: DC | PRN

## 2014-08-06 MED ORDER — CEFAZOLIN SODIUM-DEXTROSE 2-3 GM-% IV SOLR
INTRAVENOUS | Status: AC
  Filled 2014-08-06: qty 50

## 2014-08-06 MED ORDER — PHENOL 1.4 % MT LIQD
1.0000 | OROMUCOSAL | Status: DC | PRN
Start: 2014-08-06 — End: 2014-08-10

## 2014-08-06 MED ORDER — OXYCODONE HCL 5 MG PO TABS
5.0000 mg | ORAL_TABLET | ORAL | Status: DC | PRN
  Administered 2014-08-06 (×2): 10 mg via ORAL
  Administered 2014-08-06: 5 mg via ORAL
  Administered 2014-08-07 (×2): 10 mg via ORAL
  Filled 2014-08-06: qty 2
  Filled 2014-08-06: qty 1
  Filled 2014-08-06 (×3): qty 2

## 2014-08-06 MED ORDER — ONDANSETRON HCL 4 MG PO TABS: 4.0000 mg | ORAL_TABLET | Freq: Four times a day (QID) | ORAL | Status: DC | PRN

## 2014-08-06 MED ORDER — SODIUM CHLORIDE 0.9 % IR SOLN
Status: DC | PRN
  Administered 2014-08-06 (×2): 1000 mL

## 2014-08-06 MED ORDER — ENOXAPARIN SODIUM 30 MG/0.3ML ~~LOC~~ SOLN
30.0000 mg | Freq: Two times a day (BID) | SUBCUTANEOUS | Status: DC
  Administered 2014-08-07 – 2014-08-10 (×7): 30 mg via SUBCUTANEOUS
  Filled 2014-08-06 (×9): qty 0.3

## 2014-08-06 MED ORDER — DIPHENHYDRAMINE HCL 12.5 MG/5ML PO ELIX: 12.5000 mg | ORAL_SOLUTION | ORAL | Status: DC | PRN

## 2014-08-06 MED ORDER — BUPIVACAINE-EPINEPHRINE 0.25% -1:200000 IJ SOLN
INTRAMUSCULAR | Status: DC | PRN
  Administered 2014-08-06: 40 mL

## 2014-08-06 MED ORDER — MAGNESIUM CITRATE PO SOLN: 1.0000 | Freq: Once | ORAL | Status: AC | PRN

## 2014-08-06 MED ORDER — RISAQUAD PO CAPS
1.0000 | ORAL_CAPSULE | Freq: Every day | ORAL | Status: DC
  Administered 2014-08-06 – 2014-08-10 (×5): 1 via ORAL
  Filled 2014-08-06 (×5): qty 1

## 2014-08-06 MED ORDER — ATENOLOL 25 MG PO TABS
25.0000 mg | ORAL_TABLET | Freq: Every day | ORAL | Status: DC
  Administered 2014-08-07 – 2014-08-10 (×3): 25 mg via ORAL
  Filled 2014-08-06 (×4): qty 1

## 2014-08-06 MED ORDER — OXYCODONE-ACETAMINOPHEN 5-325 MG PO TABS: 1.0000 | ORAL_TABLET | ORAL | Status: DC | PRN

## 2014-08-06 MED ORDER — SENNOSIDES-DOCUSATE SODIUM 8.6-50 MG PO TABS
1.0000 | ORAL_TABLET | Freq: Every evening | ORAL | Status: DC | PRN
  Administered 2014-08-08: 1 via ORAL
  Filled 2014-08-06: qty 1

## 2014-08-06 MED ORDER — PHENYLEPHRINE HCL 10 MG/ML IJ SOLN
INTRAMUSCULAR | Status: DC | PRN
  Administered 2014-08-06: 80 ug via INTRAVENOUS
  Administered 2014-08-06 (×2): 40 ug via INTRAVENOUS
  Administered 2014-08-06: 80 ug via INTRAVENOUS

## 2014-08-06 MED ORDER — EPHEDRINE SULFATE 50 MG/ML IJ SOLN
INTRAMUSCULAR | Status: DC | PRN
  Administered 2014-08-06: 5 mg via INTRAVENOUS
  Administered 2014-08-06: 10 mg via INTRAVENOUS

## 2014-08-06 MED ORDER — ONDANSETRON HCL 4 MG/2ML IJ SOLN
INTRAMUSCULAR | Status: AC
  Filled 2014-08-06: qty 2

## 2014-08-06 MED ORDER — ONDANSETRON HCL 4 MG/2ML IJ SOLN
INTRAMUSCULAR | Status: DC | PRN
  Administered 2014-08-06: 4 mg via INTRAVENOUS

## 2014-08-06 MED ORDER — ACETAMINOPHEN 650 MG RE SUPP: 650.0000 mg | Freq: Four times a day (QID) | RECTAL | Status: DC | PRN

## 2014-08-06 MED ORDER — HYDROMORPHONE HCL 1 MG/ML IJ SOLN
1.0000 mg | INTRAMUSCULAR | Status: DC | PRN
  Administered 2014-08-06 – 2014-08-08 (×9): 1 mg via INTRAVENOUS
  Filled 2014-08-06 (×10): qty 1

## 2014-08-06 MED ORDER — PRESERVISION AREDS 2 PO CAPS: 1.0000 | ORAL_CAPSULE | Freq: Every day | ORAL | Status: DC

## 2014-08-06 MED ORDER — LIDOCAINE HCL (CARDIAC) 20 MG/ML IV SOLN
INTRAVENOUS | Status: AC
  Filled 2014-08-06: qty 5

## 2014-08-06 MED ORDER — PROPOFOL INFUSION 10 MG/ML OPTIME
INTRAVENOUS | Status: DC | PRN
  Administered 2014-08-06: 50 ug/kg/min via INTRAVENOUS

## 2014-08-06 MED ORDER — PHENYLEPHRINE 40 MCG/ML (10ML) SYRINGE FOR IV PUSH (FOR BLOOD PRESSURE SUPPORT)
PREFILLED_SYRINGE | INTRAVENOUS | Status: AC
  Filled 2014-08-06: qty 10

## 2014-08-06 MED ORDER — DOCUSATE SODIUM 100 MG PO CAPS
100.0000 mg | ORAL_CAPSULE | Freq: Two times a day (BID) | ORAL | Status: DC
  Administered 2014-08-06 – 2014-08-10 (×8): 100 mg via ORAL

## 2014-08-06 MED ORDER — WARFARIN SODIUM 6 MG PO TABS
6.0000 mg | ORAL_TABLET | Freq: Once | ORAL | Status: AC
  Administered 2014-08-06: 6 mg via ORAL
  Filled 2014-08-06: qty 1

## 2014-08-06 MED ORDER — ACETAMINOPHEN 325 MG PO TABS
650.0000 mg | ORAL_TABLET | Freq: Four times a day (QID) | ORAL | Status: DC | PRN
Start: 2014-08-06 — End: 2014-08-10
  Administered 2014-08-07 (×2): 650 mg via ORAL
  Filled 2014-08-06 (×2): qty 2

## 2014-08-06 MED ORDER — INSULIN ASPART 100 UNIT/ML ~~LOC~~ SOLN
0.0000 [IU] | Freq: Three times a day (TID) | SUBCUTANEOUS | Status: DC
  Administered 2014-08-07 (×3): 2 [IU] via SUBCUTANEOUS

## 2014-08-06 MED ORDER — LIDOCAINE HCL (CARDIAC) 20 MG/ML IV SOLN
INTRAVENOUS | Status: DC | PRN
  Administered 2014-08-06: 50 mg via INTRAVENOUS

## 2014-08-06 MED ORDER — CEFAZOLIN SODIUM-DEXTROSE 2-3 GM-% IV SOLR
2.0000 g | INTRAVENOUS | Status: AC
  Administered 2014-08-06: 2 g via INTRAVENOUS

## 2014-08-06 MED ORDER — METHOCARBAMOL 500 MG PO TABS
500.0000 mg | ORAL_TABLET | Freq: Four times a day (QID) | ORAL | Status: DC | PRN
  Administered 2014-08-06 – 2014-08-09 (×8): 500 mg via ORAL
  Filled 2014-08-06 (×8): qty 1

## 2014-08-06 MED ORDER — METOCLOPRAMIDE HCL 5 MG/ML IJ SOLN
INTRAMUSCULAR | Status: AC
  Filled 2014-08-06: qty 2

## 2014-08-06 MED ORDER — LACTATED RINGERS IV SOLN
INTRAVENOUS | Status: DC
  Administered 2014-08-06: 1000 mL via INTRAVENOUS
  Administered 2014-08-06 (×2): via INTRAVENOUS

## 2014-08-06 MED ORDER — METOCLOPRAMIDE HCL 5 MG/ML IJ SOLN: 5.0000 mg | Freq: Three times a day (TID) | INTRAMUSCULAR | Status: DC | PRN

## 2014-08-06 MED ORDER — ALUM & MAG HYDROXIDE-SIMETH 200-200-20 MG/5ML PO SUSP: 30.0000 mL | ORAL | Status: DC | PRN

## 2014-08-06 MED ORDER — TRAMADOL HCL 50 MG PO TABS
100.0000 mg | ORAL_TABLET | Freq: Every day | ORAL | Status: DC
  Administered 2014-08-07 – 2014-08-08 (×2): 100 mg via ORAL
  Filled 2014-08-06 (×2): qty 2

## 2014-08-06 MED ORDER — ONDANSETRON HCL 4 MG/2ML IJ SOLN
4.0000 mg | Freq: Four times a day (QID) | INTRAMUSCULAR | Status: DC | PRN
  Administered 2014-08-08: 4 mg via INTRAVENOUS
  Filled 2014-08-06: qty 2

## 2014-08-06 MED ORDER — BUPIVACAINE-EPINEPHRINE (PF) 0.25% -1:200000 IJ SOLN
INTRAMUSCULAR | Status: AC
  Filled 2014-08-06: qty 60

## 2014-08-06 SURGICAL SUPPLY — 71 items
BAG ZIPLOCK 12X15 (MISCELLANEOUS) IMPLANT
BANDAGE ELASTIC 4 VELCRO ST LF (GAUZE/BANDAGES/DRESSINGS) ×2 IMPLANT
BANDAGE ELASTIC 6 VELCRO ST LF (GAUZE/BANDAGES/DRESSINGS) ×2 IMPLANT
BANDAGE ESMARK 6X9 LF (GAUZE/BANDAGES/DRESSINGS) ×1 IMPLANT
BLADE SAG 18X100X1.27 (BLADE) ×2 IMPLANT
BLADE SAW SGTL 13.0X1.19X90.0M (BLADE) ×2 IMPLANT
BNDG ESMARK 6X9 LF (GAUZE/BANDAGES/DRESSINGS) ×2
CAP KNEE TOTAL 3 SIGMA ×2 IMPLANT
CEMENT HV SMART SET (Cement) ×4 IMPLANT
CHLORAPREP W/TINT 26ML (MISCELLANEOUS) IMPLANT
CLOTH 2% CHLOROHEXIDINE 3PK (PERSONAL CARE ITEMS) ×2 IMPLANT
CUFF TOURN SGL QUICK 34 (TOURNIQUET CUFF) ×1
CUFF TRNQT CYL 34X4X40X1 (TOURNIQUET CUFF) ×1 IMPLANT
DECANTER SPIKE VIAL GLASS SM (MISCELLANEOUS) ×2 IMPLANT
DRAPE INCISE IOBAN 66X45 STRL (DRAPES) IMPLANT
DRAPE ORTHO SPLIT 77X108 STRL (DRAPES) ×2
DRAPE POUCH INSTRU U-SHP 10X18 (DRAPES) ×2 IMPLANT
DRAPE SHEET LG 3/4 BI-LAMINATE (DRAPES) ×2 IMPLANT
DRAPE SURG ORHT 6 SPLT 77X108 (DRAPES) ×2 IMPLANT
DRAPE U-SHAPE 47X51 STRL (DRAPES) ×2 IMPLANT
DRSG ADAPTIC 3X8 NADH LF (GAUZE/BANDAGES/DRESSINGS) IMPLANT
DRSG AQUACEL AG ADV 3.5X10 (GAUZE/BANDAGES/DRESSINGS) ×2 IMPLANT
DRSG PAD ABDOMINAL 8X10 ST (GAUZE/BANDAGES/DRESSINGS) IMPLANT
DRSG TEGADERM 4X4.75 (GAUZE/BANDAGES/DRESSINGS) IMPLANT
DURAPREP 26ML APPLICATOR (WOUND CARE) ×2 IMPLANT
ELECT REM PT RETURN 9FT ADLT (ELECTROSURGICAL) ×2
ELECTRODE REM PT RTRN 9FT ADLT (ELECTROSURGICAL) ×1 IMPLANT
EVACUATOR 1/8 PVC DRAIN (DRAIN) IMPLANT
FACESHIELD WRAPAROUND (MASK) ×10 IMPLANT
GAUZE SPONGE 2X2 8PLY STRL LF (GAUZE/BANDAGES/DRESSINGS) IMPLANT
GAUZE SPONGE 4X4 12PLY STRL (GAUZE/BANDAGES/DRESSINGS) IMPLANT
GLOVE BIOGEL PI IND STRL 7.5 (GLOVE) ×1 IMPLANT
GLOVE BIOGEL PI IND STRL 8 (GLOVE) ×1 IMPLANT
GLOVE BIOGEL PI INDICATOR 7.5 (GLOVE) ×1
GLOVE BIOGEL PI INDICATOR 8 (GLOVE) ×1
GLOVE SURG SS PI 7.5 STRL IVOR (GLOVE) ×2 IMPLANT
GLOVE SURG SS PI 8.0 STRL IVOR (GLOVE) ×4 IMPLANT
GOWN STRL REUS W/TWL XL LVL3 (GOWN DISPOSABLE) ×4 IMPLANT
HANDPIECE INTERPULSE COAX TIP (DISPOSABLE) ×1
IMMOBILIZER KNEE 20 (SOFTGOODS) ×2
IMMOBILIZER KNEE 20 THIGH 36 (SOFTGOODS) ×1 IMPLANT
KIT BASIN OR (CUSTOM PROCEDURE TRAY) ×2 IMPLANT
MANIFOLD NEPTUNE II (INSTRUMENTS) ×2 IMPLANT
NDL SAFETY ECLIPSE 18X1.5 (NEEDLE) ×1 IMPLANT
NEEDLE HYPO 18GX1.5 SHARP (NEEDLE) ×1
NS IRRIG 1000ML POUR BTL (IV SOLUTION) IMPLANT
PACK TOTAL JOINT (CUSTOM PROCEDURE TRAY) ×2 IMPLANT
PADDING CAST COTTON 6X4 STRL (CAST SUPPLIES) IMPLANT
PEN SKIN MARKING BROAD (MISCELLANEOUS) ×2 IMPLANT
POSITIONER SURGICAL ARM (MISCELLANEOUS) ×2 IMPLANT
SET HNDPC FAN SPRY TIP SCT (DISPOSABLE) ×1 IMPLANT
SPONGE GAUZE 2X2 STER 10/PKG (GAUZE/BANDAGES/DRESSINGS)
SPONGE SURGIFOAM ABS GEL 100 (HEMOSTASIS) IMPLANT
STAPLER VISISTAT (STAPLE) ×2 IMPLANT
STRIP CLOSURE SKIN 1/2X4 (GAUZE/BANDAGES/DRESSINGS) IMPLANT
SUCTION FRAZIER 12FR DISP (SUCTIONS) ×2 IMPLANT
SUT BONE WAX W31G (SUTURE) IMPLANT
SUT MNCRL AB 4-0 PS2 18 (SUTURE) IMPLANT
SUT VIC AB 1 CT1 27 (SUTURE) ×1
SUT VIC AB 1 CT1 27XBRD ANTBC (SUTURE) ×1 IMPLANT
SUT VIC AB 2-0 CT1 27 (SUTURE) ×3
SUT VIC AB 2-0 CT1 TAPERPNT 27 (SUTURE) ×3 IMPLANT
SUT VLOC 180 0 24IN GS25 (SUTURE) ×2 IMPLANT
SYR 50ML LL SCALE MARK (SYRINGE) ×2 IMPLANT
TOWEL OR 17X26 10 PK STRL BLUE (TOWEL DISPOSABLE) ×2 IMPLANT
TOWEL OR NON WOVEN STRL DISP B (DISPOSABLE) IMPLANT
TOWER CARTRIDGE SMART MIX (DISPOSABLE) ×2 IMPLANT
TRAY FOLEY CATH 14FRSI W/METER (CATHETERS) ×2 IMPLANT
WATER STERILE IRR 1500ML POUR (IV SOLUTION) ×2 IMPLANT
WRAP KNEE MAXI GEL POST OP (GAUZE/BANDAGES/DRESSINGS) ×2 IMPLANT
YANKAUER SUCT BULB TIP 10FT TU (MISCELLANEOUS) ×2 IMPLANT

## 2014-08-06 NOTE — Brief Op Note (Signed)
08/06/2014  12:25 PM  PATIENT:  [name]  69 y.o. male  PRE-OPERATIVE DIAGNOSIS:  DEGENERATIVE JOINT DISEASE LEFT KNEE  POST-OPERATIVE DIAGNOSIS:  DEGENERATIVE JOINT DISEASE LEFT KNEE  PROCEDURE:  Procedure(s): LEFT TOTAL KNEE ARTHROPLASTY (Left)  SURGEON:  Surgeon(s) and Role:    * Susa Day, MD - Primary  PHYSICIAN ASSISTANT:   ASSISTANTS: Bissell   ANESTHESIA:   general  EBL:  Total I/O In: 2000 [I.V.:2000] Out: 175 [Urine:175]  BLOOD ADMINISTERED:none  DRAINS: none   LOCAL MEDICATIONS USED:  MARCAINE     SPECIMEN:  No Specimen  DISPOSITION OF SPECIMEN:  N/A  COUNTS:  YES  TOURNIQUET:  * Missing tourniquet times found for documented tourniquets in log:  [id number] *  DICTATION: .Other Dictation: Dictation Number [id number]  PLAN OF CARE: Admit for overnight observation  PATIENT DISPOSITION:  PACU - hemodynamically stable.   Delay start of Pharmacological VTE agent (>24hrs) due to surgical blood loss or risk of bleeding: no

## 2014-08-06 NOTE — Progress Notes (Signed)
Utilization review completed.  

## 2014-08-06 NOTE — Progress Notes (Signed)
ANTICOAGULATION CONSULT NOTE - Initial Consult  Pharmacy Consult for warfarin Indication: atrial fibrillation; VTE prophylaxis following TKA  Allergies  Allergen Reactions  . Sulfa Drugs Cross Reactors Other (See Comments)  . Tetracyclines & Related Other (See Comments)  . Contrast Media [Iodinated Diagnostic Agents] Rash    Patient Measurements: Height: 6' (182.9 cm) Weight: 225 lb (102.059 kg) IBW/kg (Calculated) : 77.6  Vital Signs: Temp: 97.7 F (36.5 C) (03/24 1407) Temp Source: Oral (03/24 0748) BP: 111/78 mmHg (03/24 1407) Pulse Rate: 70 (03/24 1407)  Labs:  Recent Labs  08/06/14 0830  LABPROT 15.5*  INR 1.22    Estimated Creatinine Clearance: 76.6 mL/min (by C-G formula based on Cr of 1.11).   Medical History: Past Medical History  Diagnosis Date  . Palpitations   . Diabetes mellitus   . Hypercholesteremia   . Hypertension   . Depression   . Hx of echocardiogram     Echo 3/16:  Mild LVH, EF 60-65%, mild MR, severe LAE, mild RAE, PASP 32 mmHg  . Prostate cancer     seed implantion- 15 to 20 yrs ago  . Arrhythmia     hx. Atrial Fib. ,"with regurgitation"  . Heart murmur   . Sleep apnea     uses CPAP-settings 5  . Ureter, stricture     hx. 2 yrs ago-stent has been removed now    Medications:  Scheduled:  . acidophilus  1 capsule Oral Daily  . [START ON 08/07/2014] allopurinol  300 mg Oral Daily  . [START ON 08/07/2014] atenolol  25 mg Oral Daily  .  ceFAZolin (ANCEF) IV  2 g Intravenous Q6H  . docusate sodium  100 mg Oral BID  . [START ON 08/07/2014] enoxaparin (LOVENOX) injection  30 mg Subcutaneous Q12H  . insulin aspart  0-15 Units Subcutaneous TID WC  . [START ON 08/07/2014] multivitamin-lutein  1 capsule Oral Daily  . [START ON 08/07/2014] traMADol  100 mg Oral Daily   Infusions:  . 0.45 % NaCl with KCl 20 mEq / L     PRN: acetaminophen **OR** acetaminophen, alum & mag hydroxide-simeth, bisacodyl, diphenhydrAMINE, HYDROmorphone (DILAUDID)  injection, magnesium citrate, menthol-cetylpyridinium **OR** phenol, methocarbamol **OR** methocarbamol (ROBAXIN)  IV, metoCLOPramide **OR** metoCLOPramide (REGLAN) injection, ondansetron **OR** ondansetron (ZOFRAN) IV, oxyCODONE, senna-docusate  Assessment: 69 y/o M on chronic warfarin for atrial fibrillation, underwent L TKA on 08/06/14.  Warfarin had been interrupted for surgery (last taken 3/18).  Orders received to resume warfarin postoperatively with pharmacy dosing assistance, and to begin Lovenox 30 mg SQ q12h starting the morning of POD#1.  Patient's most recent warfarin dosage was reportedly 4 mg daily except 6 mg on Wednesdays and Fridays.  Drug interactions:  allopurinol can increase INR response to warfarin, but patient was on this PTA.   Goal of Therapy:  INR 2-3 Monitor platelets by anticoagulation protocol: Yes   Plan:  1. Warfarin 6 mg PO x 1 today at 6pm. 2. PT / INR daily starting tomorrow 3. Lovenox 30 mg SQ q12h starting tomorrow AM as ordered by ortho. 4. Follow clinical course.  Clayburn Pert, PharmD, BCPS Pager: [phone] 08/06/2014  2:29 PM

## 2014-08-06 NOTE — Interval H&P Note (Signed)
History and Physical Interval Note:  08/06/2014 7:31 AM  [name]  has presented today for surgery, with the diagnosis of DJD LEFT KNEE  The various methods of treatment have been discussed with the patient and family. After consideration of risks, benefits and other options for treatment, the patient has consented to  Procedure(s): LEFT TOTAL KNEE ARTHROPLASTY (Left) as a surgical intervention .  The patient's history has been reviewed, patient examined, no change in status, stable for surgery.  I have reviewed the patient's chart and labs.  Questions were answered to the patient's satisfaction.     Kolbey Teichert C

## 2014-08-06 NOTE — Evaluation (Signed)
Physical Therapy Evaluation Patient Details Name: [name] MRN: [id number] DOB: Apr 04, 1946 Today's Date: 08/06/2014   History of Present Illness  L TKR  Clinical Impression  Pt s/p L TKR presents with decreased L LE strength/ROM and post op pain limiting functional mobility.  Pt should progress to dc home with family assist and HHPT follow up.    Follow Up Recommendations Home health PT    Equipment Recommendations  None recommended by PT    Recommendations for Other Services OT consult     Precautions / Restrictions Precautions Precautions: Knee;Fall Required Braces or Orthoses: Knee Immobilizer - Left Knee Immobilizer - Left: Discontinue once straight leg raise with < 10 degree lag Restrictions Weight Bearing Restrictions: No Other Position/Activity Restrictions: WBAT      Mobility  Bed Mobility Overal bed mobility: Needs Assistance Bed Mobility: Supine to Sit     Supine to sit: Min assist     General bed mobility comments: cues for sequence and use of R LE to self assist  Transfers Overall transfer level: Needs assistance Equipment used: Rolling walker (2 wheeled) Transfers: Sit to/from Stand Sit to Stand: Min assist;Mod assist;From elevated surface         General transfer comment: cues for LE management and use of UEs to self assist  Ambulation/Gait Ambulation/Gait assistance: Min assist;Mod assist Ambulation Distance (Feet): 5 Feet Assistive device: Rolling walker (2 wheeled) Gait Pattern/deviations: Step-to pattern;Decreased step length - right;Decreased step length - left;Shuffle;Trunk flexed Gait velocity: decr   General Gait Details: cues for sequence, posture and position from [employer]            Wheelchair Mobility    Modified Rankin (Stroke Patients Only)       Balance                                             Pertinent Vitals/Pain Pain Assessment: 0-10 Pain Score: 4  Pain Location: L knee Pain  Descriptors / Indicators: Aching;Sore Pain Intervention(s): Limited activity within patient's tolerance;Monitored during session;Premedicated before session;Ice applied    Home Living Family/patient expects to be discharged to:: Private residence Living Arrangements: Spouse/significant other Available Help at Discharge: Family Type of Home: House Home Access: Stairs to enter Entrance Stairs-Rails: None Technical brewer of Steps: 2 Home Layout: One level Home Equipment: Environmental consultant - 2 wheels      Prior Function Level of Independence: Independent               Hand Dominance   Dominant Hand: Right    Extremity/Trunk Assessment   Upper Extremity Assessment: Overall WFL for tasks assessed           Lower Extremity Assessment: LLE deficits/detail   LLE Deficits / Details: 2+/5 quads  Cervical / Trunk Assessment: Normal  Communication   Communication: No difficulties  Cognition Arousal/Alertness: Awake/alert Behavior During Therapy: WFL for tasks assessed/performed Overall Cognitive Status: Within Functional Limits for tasks assessed                      General Comments      Exercises        Assessment/Plan    PT Assessment Patient needs continued PT services  PT Diagnosis Difficulty walking   PT Problem List Decreased strength;Decreased range of motion;Decreased activity tolerance;Decreased mobility;Decreased knowledge of use of DME;Pain  PT Treatment  Interventions DME instruction;Gait training;Stair training;Functional mobility training;Therapeutic activities;Therapeutic exercise;Patient/family education   PT Goals (Current goals can be found in the Care Plan section) Acute Rehab PT Goals Patient Stated Goal: Resume previous lifestyle with decreased pain PT Goal Formulation: With patient Time For Goal Achievement: 08/13/14 Potential to Achieve Goals: Good    Frequency 7X/week   Barriers to discharge        Co-evaluation                End of Session Equipment Utilized During Treatment: Gait belt;Left knee immobilizer Activity Tolerance: Patient tolerated treatment well Patient left: in chair;with call bell/phone within reach;with family/visitor present Nurse Communication: Mobility status         Time: 8177-1165 PT Time Calculation (min) (ACUTE ONLY): 32 min   Charges:   PT Evaluation $Initial PT Evaluation Tier I: 1 Procedure PT Treatments $Therapeutic Activity: 8-22 mins   PT G Codes:        [name] 08/19/2014, 4:45 PM

## 2014-08-06 NOTE — Anesthesia Procedure Notes (Signed)
Spinal Patient location during procedure: OR Start time: 08/06/2014 10:35 AM End time: 08/06/2014 10:41 AM Staffing Anesthesiologist: Roderic Palau Performed by: anesthesiologist  Preanesthetic Checklist Completed: patient identified, surgical consent, pre-op evaluation, timeout performed, IV checked, risks and benefits discussed and monitors and equipment checked Spinal Block Patient position: sitting Prep: Betadine Patient monitoring: cardiac monitor, continuous pulse ox and blood pressure Approach: midline Location: L3-4 Injection technique: single-shot Needle Needle type: Pencan  Needle gauge: 24 G Needle length: 9 cm Assessment Sensory level: T6 Additional Notes Functioning IV was confirmed and monitors were applied. Sterile prep and drape, including hand hygiene and sterile gloves were used. The patient was positioned and the spine was prepped. The skin was anesthetized with lidocaine.  Free flow of clear CSF was obtained prior to injecting local anesthetic into the CSF.  The spinal needle aspirated freely following injection.  The needle was carefully withdrawn.  The patient tolerated the procedure well.

## 2014-08-06 NOTE — Progress Notes (Signed)
Pt placed on CPAP at 5 CMH20 per Pt home settings via Pt home mask and tubing.  Pt tolerating well at this time, RT instructed Pt to call if any assistance is required throughout the night.  RT to monitor and assess as needed.

## 2014-08-06 NOTE — H&P (View-Only) (Signed)
[name] DOB: [birth year] Married / Language: English / Race: White Male  H&P Date: 07/29/14  Chief Complaint: Left knee pain  History of Present Illness The patient is a 69 year old male who comes in today for a preoperative History and Physical. The patient is scheduled for a left total knee arthroplasty to be performed by [name], MD at [facility] on 08/06/2014. [name] is several months out from his most recent flareup with ongoing L knee pain, swelling stiffness due to DJD refractory to aspirations, steroid injections, bracing, activity modifications, quad strengthening, HEP, relative rest, medications.  [name] and the patient mutually agreed to proceed with a total knee replacement. Risks and benefits of the procedure were discussed including stiffness, suboptimal range of motion, persistent pain, infection requiring removal of prosthesis and reinsertion, need for prophylactic antibiotics in the future, for example, dental procedures, possible need for manipulation, revision in the future and also anesthetic complications including DVT, PE, etc. We discussed the perioperative course, time in the hospital, postoperative recovery and the need for elevation to control swelling. We also discussed the predicted range of motion and the probability that squatting and kneeling would be unobtainable in the future. In addition, postoperative anticoagulation was discussed. We have obtained preoperative medical clearance as necessary- [name], [name]. Provided illustrated handout and discussed it in detail. They will enroll in the total joint replacement educational forum at the hospital.  He had his [facility] pre-op appt this morning. He is on coumadin for A.Fib and was directed to hold x 5 days pre-op. He has been off coumadin previously for ESI's and has never had to bridge with Lovenox or any other meds. He sees [name] for pain mgmt, injection therapy only, is not on  chronic pain meds, and does have a lumbar bulging disc. He does feel some pain in his back right now and this is around the time he is due for another injection. He is feeling well otherwise.  Allergies  SulfADIAZINE *SULFONAMIDES* Tetracycline HCl *TETRACYCLINES* SULFA11/16/2001 (Marked as Inactive) TETRACYCLINE11/16/2001 (Marked as Inactive)  Family History  Congestive Heart Failure Father. First Degree Relatives  Social History Marital status widowed Exercise Exercises weekly; does running / walking Tobacco / smoke exposure no Tobacco use former smoker  Past Surgical History  Straighten Nasal Septum Other Orthopaedic Surgery shoulder surgery  Past Medical Hx Diabetes Mellitus, Type II Sleep Apnea  Review of Systems  General Not Present- Chills, Fatigue, Fever, Memory Loss, Night Sweats, Weight Gain and Weight Loss. Skin Not Present- Eczema, Hives, Itching, Lesions and Rash. HEENT Not Present- Dentures, Double Vision, Headache, Hearing Loss, Tinnitus and Visual Loss. Respiratory Not Present- Allergies, Chronic Cough, Coughing up blood, Shortness of breath at rest and Shortness of breath with exertion. Cardiovascular Not Present- Chest Pain, Difficulty Breathing Lying Down, Murmur, Palpitations, Racing/skipping heartbeats and Swelling. Gastrointestinal Not Present- Abdominal Pain, Bloody Stool, Constipation, Diarrhea, Difficulty Swallowing, Heartburn, Jaundice, Loss of appetitie, Nausea and Vomiting. Male Genitourinary Not Present- Blood in Urine, Discharge, Flank Pain, Incontinence, Painful Urination, Urgency, Urinary frequency, Urinary Retention, Urinating at Night and Weak urinary stream. Musculoskeletal Present- Back Pain, Joint Pain, Joint Swelling, Morning Stiffness and Muscle Pain. Not Present- Muscle Weakness and Spasms. Neurological Not Present- Blackout spells, Difficulty with balance, Dizziness, Paralysis, Tremor and Weakness. Psychiatric Not Present-  Insomnia.  Vitals  07/29/2014 1:46 PM Weight: 218 lb Height: 72in Body Surface Area: 2.21 m Body Mass Index: 29.57 kg/m  Pulse: 75 (Regular)  BP: 128/80 (Sitting, Left Arm, Standard)  Physical Exam  General Mental Status -Alert, cooperative and good historian. General Appearance-pleasant, Not in acute distress. Orientation-Oriented X3. Build & Nutrition-Well nourished and Well developed. Gait-Antalgic.  Head and Neck Head-normocephalic, atraumatic . Neck Global Assessment - supple, no bruit auscultated on the right, no bruit auscultated on the left.  Eye Pupil - Bilateral-Regular and Round. Motion - Bilateral-EOMI.  Chest and Lung Exam Auscultation Breath sounds - clear at anterior chest wall and clear at posterior chest wall. Adventitious sounds - No Adventitious sounds.  Cardiovascular Auscultation Rhythm - Regular rate and rhythm. Heart Sounds - S1 WNL and S2 WNL. Murmurs & Other Heart Sounds - Auscultation of the heart reveals - No Murmurs.  Abdomen Palpation/Percussion Tenderness - Abdomen is non-tender to palpation. Rigidity (guarding) - Abdomen is soft. Auscultation Auscultation of the abdomen reveals - Bowel sounds normal.  Male Genitourinary Note: Not done, not pertinent to present illness  Musculoskeletal Note: Knee exam on inspection reveals no evidence of ecchymosis, deformity or erythema. He is tender in the medial joint line. Patellofemoral pain with compression. Trace effusion. Limited extension and flexion. Nontender over the fibular head or the peroneal nerve. Nontender over the quadriceps insertion of the patellar ligament insertion. Provocative maneuvers revealed a negative Lachman, negative anterior and posterior drawer and a negative McMurray. No instability was noted with varus and valgus stressing at 0 or 30 degrees. On manual motor test the quadriceps and hamstrings were 5/5. Sensory exam was intact to light  touch.  Imaging prior xrays reviewed. AP standing and lateral demonstrates no fracture. Bone on bone arthrosis.  Assessment & Plan  Primary osteoarthritis of left knee (M17.12)  Pt with end-stage L knee DJD, bone-on-bone, refractory to conservative tx, scheduled for L total knee replacement by [name] on 08/06/14. We again discussed the procedure itself as well as risks, complications and alternatives, including but not limited to DVT, PE, infx, bleeding, failure of procedure, need for secondary procedure including manipulation, nerve injury, ongoing pain/symptoms, anesthesia risk, even stroke or death. Also discussed typical post-op protocols, activity restrictions, need for PT, flexion/extension exercises, time out of work. Discussed need for DVT ppx post-op, he is on chronic coumadin due to A.Fib, no recent tachycardia or arrhythmias. He has been cleared to hold coumadin x 5 days. Discussed dental ppx. Also discussed limitations post-operatively such as kneeling and squatting. All questions were answered. Patient desires to proceed with surgery as scheduled. Will hold vitamins accordingly. He has been directed to hold coumadin x 5 days pre-op. Will have him take ASA 81mg  daily while off coumadin leading up to surgery for ppx then restart coumadin post-op and possibly start lovenox as well until coumadin is therapeutic. Will remain NPO after MN night before surgery and hold metformin morning of surgery as he will be NPO. Plan pain medication, Colace. Plan home with HHPT post-op with family members at home for assistance then to outpatient PT. Would recommend waiting at least 3-4 weeks post-op from surgery to consider ESI and would not recommend at this point as it would weaken his immune reponse and can make him hyperglycemic. Will follow up 10-14 days post-op for staple removal and xrays. He will call with any questions or concerns in the interim.  Plan left total knee replacement  Signed  electronically by Lacie Draft, PA-C for [name]

## 2014-08-06 NOTE — Discharge Instructions (Signed)
Elevate leg above heart 6x a day for 32minutes each Use knee immobilizer while walking until can SLR x 10 Use knee immobilizer in bed to keep knee in extension Aquacel dressing may remain in place until follow up. May shower with aquacel dressing in place. If the dressing becomes saturated or peels off, you may remove aquacel dressing. Place new dressing with gauze and tape or ACE bandage which should be kept clean and dry and changed daily.

## 2014-08-06 NOTE — Anesthesia Postprocedure Evaluation (Signed)
  Anesthesia Post-op Note  Patient: [name]  Procedure(s) Performed: Procedure(s): LEFT TOTAL KNEE ARTHROPLASTY (Left)  Patient Location: PACU  Anesthesia Type: Spinal/MAC  Level of Consciousness: awake and alert   Airway and Oxygen Therapy: Patient Spontanous Breathing  Post-op Pain: none  Post-op Assessment: Post-op Vital signs reviewed, Patient's Cardiovascular Status Stable and Respiratory Function Stable. No residual motor block.  Post-op Vital Signs: Reviewed  Filed Vitals:   08/06/14 1300  BP: 113/79  Pulse: 74  Temp:   Resp: 20    Complications: No apparent anesthesia complications

## 2014-08-06 NOTE — Op Note (Signed)
NAMEAMARII, [name] NO.:  [id number]  MEDICAL RECORD NO.:  [id number]  LOCATION:  9509                         FACILITY:  [facility]  PHYSICIAN:  Susa Day, M.D.    DATE OF BIRTH:  08-02-1945  DATE OF PROCEDURE:  08/06/2014 DATE OF DISCHARGE:                              OPERATIVE REPORT   PREOPERATIVE DIAGNOSIS:  End-stage osteoarthrosis, degenerative joint disease, left knee.  POSTOPERATIVE DIAGNOSIS:  End-stage osteoarthrosis, degenerative joint disease, left knee.  PROCEDURES PERFORMED:  Left total knee arthroplasty.  ANESTHESIA:  General.  ASSISTANT:  Sean Dunker, PA.  COMPONENTS:  DePuy rotating platform, 5 femur, 5 tibia, 10 mm insert, 38 patella.  HISTORY:  A 69 year old with bone-on-bone arthrosis, medial compartment. Indicated for replacement of the degenerated joint, failing conservative treatment.  Risk and benefits discussed including bleeding, infection, damage to neurovascular structures, DVT, PE, anesthetic complications, suboptimal range of motion, etc.  TECHNIQUE:  With the patient in supine position after induction of adequate general anesthesia and spinal, left lower extremity was prepped, draped, and exsanguinated in usual sterile fashion.  Thigh tourniquet inflated to 300 mmHg after exsanguination.  Midline incision was made in the knee.  Full-thickness flaps developed.  Median and parapatellar arthrotomy performed.  Patella everted and knee flexed. Elevated soft tissues medially preserving the MCL.  Tricompartmental osteoarthrosis was noted, particularly medial compartment and slight internally rotated femur.  Remnants of the medial lateral menisci and ACL were removed.  Step drill was utilized to enter femoral canal and it was irrigated, 5 degree left was placed 10 off the distal femur, pinned, performed our distal femoral cut.  We then sized off the anterior femur to a 5 and was pinned.  Performed an anterior,  posterior, and chamfer cuts.  Attention turned towards the tibia, was subluxed with [name]. External alignment guide used for the defect, which was medially, bisecting tibiotalar joint parallel to the tibial shaft, slight slope, pinned, performed our cut with an oscillating saw protecting posteriorly at all times.  Checked our flexion and extension gaps, they were equivalent.  Turned back towards the tibia, maximized the tibia with a 5, pinned, and performed our central drill guide and a punch guide and then performed our block cut of the femur bisecting the condyles slightly lateral, pinned, performed our box cut, placed a trial femur and tibia and a 10 mm insert.  We had full extension, full flexion, good stability with varus and valgus stressing at 0 and 30 degrees.  Turned our attention towards the patella, measured to a 26.  We planed it to a 16.  Used external patellar jig, planed it, sized to a 38.  Drilled our peg holes medializing them, placed a trial patella, excellent patellofemoral tracking.  There was slight hypertrophic quadriceps tendon noted.  Excellent patellofemoral tracking.  We removed all trials, checked posteriorly, cauterized the geniculate, popliteus intact.  Copiously irrigated with pulsatile lavage.  Mixed the cement on back table in appropriate fashion.  Flexed the knee, all surfaces thoroughly dried.  We injected cement in the tibial canal digitally pressurizing it.  We cemented a 5 tray, redundant cement removed.  We cemented a 5 femur,  redundant cement removed.  Trial 10 insert placed, held the knee in extension with an axial load applied throughout the curing of the cement, cemented the patella.  After curing of the cement, knee was flexed.  Meticulously removed all redundant cement with an osteotome and pickups.  Copiously irrigated the wound.  Placed a permanent 10 mm insert.  We had then full extension, full flexion, good stability with varus and valgus  stressing 0 and 30 degrees.  Negative anterior drawer.  Excellent patellofemoral tracking.  Used 0.25% Marcaine with epinephrine infiltrating in the periarticular tissues.  No drain.  Reapproximated the patellar tendon with #1 Vicryl in slight flexion.  Bone wax placed on the cancellous surfaces.  Used running V- Loc to close the patellar ligament.  We then flexed to 140 degrees and full extension, had excellent patellofemoral tracking.  Subcu with 2-0, and skin with staples after copious irrigation with antibiotic irrigation.  Had flexion to gravity at 90 degrees.  Tourniquet was deflated and there was adequate revascularization to lower extremity appreciated.  Tourniquet time was approximately 90 minutes.  Good pulses.  Good leg lengths.  The patient tolerated the procedure well. No complications.  Awoken without difficulty and transported to the recovery room in satisfactory condition.  Assistant, [name], [place].  Minimal blood loss.     Susa Day, M.D.     [name]  D:  08/06/2014  T:  08/06/2014  Job:  [id number]

## 2014-08-06 NOTE — Anesthesia Preprocedure Evaluation (Addendum)
Anesthesia Evaluation  Patient identified by MRN, date of birth, ID band Patient awake    Reviewed: Allergy & Precautions, H&P , NPO status , Patient's Chart, lab work & pertinent test results, reviewed documented beta blocker date and time   Airway Mallampati: II  TM Distance: >3 FB Neck ROM: Full    Dental no notable dental hx. (+) Edentulous Upper, Dental Advisory Given, Partial Lower   Pulmonary sleep apnea and Continuous Positive Airway Pressure Ventilation , former smoker,  breath sounds clear to auscultation  Pulmonary exam normal       Cardiovascular hypertension, Pt. on medications and Pt. on home beta blockers + dysrhythmias Atrial Fibrillation Rhythm:Irregular Rate:Normal     Neuro/Psych Depression negative neurological ROS  negative psych ROS   GI/Hepatic negative GI ROS, Neg liver ROS,   Endo/Other  diabetes, Type 2, Oral Hypoglycemic Agents  Renal/GU negative Renal ROS  negative genitourinary   Musculoskeletal   Abdominal   Peds  Hematology negative hematology ROS (+)   Anesthesia Other Findings   Reproductive/Obstetrics negative OB ROS                            Anesthesia Physical Anesthesia Plan  ASA: III  Anesthesia Plan: Spinal   Post-op Pain Management:    Induction: Intravenous  Airway Management Planned: Simple Face Mask  Additional Equipment:   Intra-op Plan:   Post-operative Plan:   Informed Consent: I have reviewed the patients History and Physical, chart, labs and discussed the procedure including the risks, benefits and alternatives for the proposed anesthesia with the patient or authorized representative who has indicated his/her understanding and acceptance.   Dental advisory given  Plan Discussed with: CRNA and Surgeon  Anesthesia Plan Comments:        Anesthesia Quick Evaluation

## 2014-08-06 NOTE — Transfer of Care (Signed)
Immediate Anesthesia Transfer of Care Note  Patient: [name]  Procedure(s) Performed: Procedure(s): LEFT TOTAL KNEE ARTHROPLASTY (Left)  Patient Location: PACU  Anesthesia Type:MAC and Spinal  Level of Consciousness: Patient easily awoken, sedated, comfortable, cooperative, following commands, responds to stimulation.   Airway & Oxygen Therapy: Patient spontaneously breathing, ventilating well, oxygen via simple oxygen mask.  Post-op Assessment: Report given to PACU RN, vital signs reviewed and stable, moving all extremities.   Post vital signs: Reviewed and stable.  Complications: No apparent anesthesia complications

## 2014-08-07 ENCOUNTER — Encounter (HOSPITAL_COMMUNITY): Payer: Self-pay | Admitting: Specialist

## 2014-08-07 LAB — GLUCOSE, CAPILLARY
Glucose-Capillary: 136 mg/dL — ABNORMAL HIGH (ref 70–99)
Glucose-Capillary: 141 mg/dL — ABNORMAL HIGH (ref 70–99)
Glucose-Capillary: 134 mg/dL — ABNORMAL HIGH (ref 70–99)
Glucose-Capillary: 120 mg/dL — ABNORMAL HIGH (ref 70–99)

## 2014-08-07 LAB — BASIC METABOLIC PANEL
Anion gap: 8 (ref 5–15)
BUN: 17 mg/dL (ref 6–23)
CO2: 27 mmol/L (ref 19–32)
Calcium: 8.2 mg/dL — ABNORMAL LOW (ref 8.4–10.5)
Chloride: 98 mmol/L (ref 96–112)
Creatinine, Ser: 1.13 mg/dL (ref 0.50–1.35)
GFR calc Af Amer: 74 mL/min — ABNORMAL LOW (ref >90)
GFR calc non Af Amer: 64 mL/min — ABNORMAL LOW (ref >90)
Glucose, Bld: 132 mg/dL — ABNORMAL HIGH (ref 70–99)
Potassium: 3.9 mmol/L (ref 3.5–5.1)
Sodium: 133 mmol/L — ABNORMAL LOW (ref 135–145)

## 2014-08-07 LAB — CBC
HCT: 35.7 % — ABNORMAL LOW (ref 39.0–52.0)
Hemoglobin: 12 g/dL — ABNORMAL LOW (ref 13.0–17.0)
MCH: 31.2 pg (ref 26.0–34.0)
MCHC: 33.6 g/dL (ref 30.0–36.0)
MCV: 92.7 fL (ref 78.0–100.0)
Platelets: 120 10*3/uL — ABNORMAL LOW (ref 150–400)
RBC: 3.85 MIL/uL — ABNORMAL LOW (ref 4.22–5.81)
RDW: 13.2 % (ref 11.5–15.5)
WBC: 6.7 10*3/uL (ref 4.0–10.5)

## 2014-08-07 LAB — PROTIME-INR
INR: 1.31 (ref 0.00–1.49)
Prothrombin Time: 16.5 seconds — ABNORMAL HIGH (ref 11.6–15.2)

## 2014-08-07 MED ORDER — OXYCODONE HCL 5 MG PO TABS
5.0000 mg | ORAL_TABLET | ORAL | Status: DC | PRN
  Administered 2014-08-07 (×2): 10 mg via ORAL
  Administered 2014-08-07 – 2014-08-08 (×2): 15 mg via ORAL
  Administered 2014-08-08: 10 mg via ORAL
  Administered 2014-08-08: 15 mg via ORAL
  Administered 2014-08-08: 10 mg via ORAL
  Administered 2014-08-08: 15 mg via ORAL
  Administered 2014-08-09 (×2): 10 mg via ORAL
  Filled 2014-08-07 (×2): qty 2
  Filled 2014-08-07 (×2): qty 3
  Filled 2014-08-07 (×3): qty 2
  Filled 2014-08-07: qty 3
  Filled 2014-08-07: qty 2
  Filled 2014-08-07: qty 3

## 2014-08-07 MED ORDER — WARFARIN SODIUM 6 MG PO TABS
6.0000 mg | ORAL_TABLET | Freq: Once | ORAL | Status: AC
  Administered 2014-08-07: 6 mg via ORAL
  Filled 2014-08-07: qty 1

## 2014-08-07 NOTE — Progress Notes (Signed)
Physical Therapy Treatment Patient Details Name: [name] MRN: [id number] DOB: [birth year] Today's Date: 08/07/2014    History of Present Illness L TKR    PT Comments    Pt progressing slowly, ltd by pain and c/o nausea and dizziness.  Follow Up Recommendations  Home health PT     Equipment Recommendations  None recommended by PT    Recommendations for Other Services OT consult     Precautions / Restrictions Precautions Precautions: Knee;Fall Required Braces or Orthoses: Knee Immobilizer - Left Knee Immobilizer - Left: Discontinue once straight leg raise with < 10 degree lag Restrictions Weight Bearing Restrictions: No LLE Weight Bearing: Weight bearing as tolerated    Mobility  Bed Mobility Overal bed mobility: Needs Assistance Bed Mobility: Supine to Sit     Supine to sit: Min assist;Mod assist     General bed mobility comments: cues for sequence and use of R LE to self assist  Transfers Overall transfer level: Needs assistance Equipment used: Rolling walker (2 wheeled) Transfers: Sit to/from Stand Sit to Stand: Min assist;Mod assist;From elevated surface         General transfer comment: cues for LE management and use of UEs to self assist  Ambulation/Gait Ambulation/Gait assistance: Min assist;Mod assist Ambulation Distance (Feet): 5 Feet Assistive device: Rolling walker (2 wheeled) Gait Pattern/deviations: Step-to pattern;Decreased step length - right;Decreased step length - left;Shuffle;Trunk flexed Gait velocity: decr   General Gait Details: cues for sequence, posture and position from [employer]            Wheelchair Mobility    Modified Rankin (Stroke Patients Only)       Balance                                    Cognition Arousal/Alertness: Awake/alert Behavior During Therapy: WFL for tasks assessed/performed Overall Cognitive Status: Within Functional Limits for tasks assessed                       Exercises Total Joint Exercises Ankle Circles/Pumps: AROM;Both;15 reps;Supine Quad Sets: AROM;Both;10 reps;Supine Heel Slides: AAROM;10 reps;Supine;Left Straight Leg Raises: AAROM;Left;10 reps;Supine Goniometric ROM: AAROM L knee -10 - 25    General Comments        Pertinent Vitals/Pain Pain Assessment: 0-10 Pain Score: 7  Pain Location: L knee Pain Descriptors / Indicators: Aching;Sore Pain Intervention(s): Limited activity within patient's tolerance;Monitored during session;Premedicated before session;Ice applied    Home Living                      Prior Function            PT Goals (current goals can now be found in the care plan section) Acute Rehab PT Goals Patient Stated Goal: Resume previous lifestyle with decreased pain PT Goal Formulation: With patient Time For Goal Achievement: 08/13/14 Potential to Achieve Goals: Good Progress towards PT goals: Progressing toward goals    Frequency  7X/week    PT Plan Current plan remains appropriate    Co-evaluation             End of Session Equipment Utilized During Treatment: Gait belt;Left knee immobilizer Activity Tolerance: Patient limited by fatigue;Patient limited by pain;Other (comment) (nausea and c/o dizziness) Patient left: in chair;with call bell/phone within reach;with family/visitor present     Time: 1205-1235 PT Time Calculation (min) (ACUTE ONLY): 30  min  Charges:  $Gait Training: 8-22 mins $Therapeutic Exercise: 8-22 mins                    G Codes:      [name] 2014/08/29, 1:21 PM

## 2014-08-07 NOTE — Progress Notes (Signed)
[place] for warfarin Indication: atrial fibrillation  Allergies  Allergen Reactions  . Sulfa Drugs Cross Reactors Other (See Comments)  . Tetracyclines & Related Other (See Comments)  . Contrast Media [Iodinated Diagnostic Agents] Rash    Patient Measurements: Height: 6' (182.9 cm) Weight: 225 lb (102.059 kg) IBW/kg (Calculated) : 77.6  Vital Signs: Temp: 98.2 F (36.8 C) (03/25 0541) Temp Source: Oral (03/25 0541) BP: 150/83 mmHg (03/25 0541) Pulse Rate: 85 (03/25 0541)  Labs:  Recent Labs  08/06/14 0830 08/06/14 1517 08/07/14 0414  HGB  --  13.0 12.0*  HCT  --  39.9 35.7*  PLT  --  118* 120*  LABPROT 15.5*  --  16.5*  INR 1.22  --  1.31  CREATININE  --  1.14 1.13    Estimated Creatinine Clearance: 75.2 mL/min (by C-G formula based on Cr of 1.13).   Medical History: Past Medical History  Diagnosis Date  . Palpitations   . Diabetes mellitus   . Hypercholesteremia   . Hypertension   . Depression   . Hx of echocardiogram     Echo 3/16:  Mild LVH, EF 60-65%, mild MR, severe LAE, mild RAE, PASP 32 mmHg  . Prostate cancer     seed implantion- 15 to 20 yrs ago  . Arrhythmia     hx. Atrial Fib. ,"with regurgitation"  . Heart murmur   . Sleep apnea     uses CPAP-settings 5  . Ureter, stricture     hx. 2 yrs ago-stent has been removed now    Medications:  Scheduled:  . acidophilus  1 capsule Oral Daily  . allopurinol  300 mg Oral Daily  . atenolol  25 mg Oral Daily  . docusate sodium  100 mg Oral BID  . enoxaparin (LOVENOX) injection  30 mg Subcutaneous Q12H  . insulin aspart  0-15 Units Subcutaneous TID WC  . multivitamin-lutein  1 capsule Oral Daily  . traMADol  100 mg Oral Daily  . Warfarin - Pharmacist Dosing Inpatient   Does not apply q1800   Infusions:  . 0.45 % NaCl with KCl 20 mEq / L 75 mL/hr at 08/07/14 0430   PRN: acetaminophen **OR** acetaminophen, alum & mag hydroxide-simeth, bisacodyl,  diphenhydrAMINE, HYDROmorphone (DILAUDID) injection, menthol-cetylpyridinium **OR** phenol, methocarbamol **OR** methocarbamol (ROBAXIN)  IV, metoCLOPramide **OR** metoCLOPramide (REGLAN) injection, ondansetron **OR** ondansetron (ZOFRAN) IV, oxyCODONE, senna-docusate  Assessment: 69 y/o M on chronic warfarin for atrial fibrillation, underwent L TKA on 08/06/14.  Warfarin had been interrupted for surgery (last taken 3/18).  Orders received to resume warfarin postoperatively with pharmacy dosing assistance, and to begin Lovenox 30 mg SQ q12h starting the morning of POD#1.  Patient's most recent warfarin dosage was reportedly 4 mg daily except 6 mg on Wednesdays and Fridays.  Drug interactions:  allopurinol can increase INR response to warfarin, but patient was on this PTA.   Goal of Therapy:  INR 2-3 Monitor platelets by anticoagulation protocol: Yes   Today, 08/07/2014: Small INR response, as expected, after one dose warfarin (see MAR for dosages) Hgb down slightly consistent with post-op blood loss and hydration Pltc slightly low, but stable and >100K No bleeding reported in chart note Diet: carb modified Drug interactions: allopurinol can increase INR response to warfarin, but patient on this PTA.  Plan: 1. Warfarin 6 mg PO x 1 today at 1800 2. PT/INR daily while inpatient. 3. Lovenox 30 mg SQ q12h as ordered by ortho until INR response sufficient  Clayburn Pert, PharmD, BCPS Pager: [phone] 08/07/2014  8:59 AM

## 2014-08-07 NOTE — Progress Notes (Signed)
Physical Therapy Treatment Patient Details Name: [name] MRN: [id number] DOB: 10-29-1945 Today's Date: 08/07/2014    History of Present Illness L TKR    PT Comments    Pt cooperative and motivated but progressing slowly 2* c/o dizziness with mobility - Orthostatic BPS performed - sup 131/72, sit 126/73, stand 129/78 - RN aware.  Follow Up Recommendations  Home health PT     Equipment Recommendations  None recommended by PT    Recommendations for Other Services OT consult     Precautions / Restrictions Precautions Precautions: Knee;Fall Required Braces or Orthoses: Knee Immobilizer - Left Knee Immobilizer - Left: Discontinue once straight leg raise with < 10 degree lag Restrictions Weight Bearing Restrictions: No LLE Weight Bearing: Weight bearing as tolerated    Mobility  Bed Mobility Overal bed mobility: Needs Assistance Bed Mobility: Sit to Supine     Supine to sit: Min assist;Mod assist Sit to supine: Min assist;Mod assist   General bed mobility comments: cues for sequence and use of R LE to self assist  Transfers Overall transfer level: Needs assistance Equipment used: Rolling walker (2 wheeled) Transfers: Sit to/from Stand Sit to Stand: Min assist;Mod assist         General transfer comment: cues for LE management and use of UEs to self assist  Ambulation/Gait Ambulation/Gait assistance: Min assist Ambulation Distance (Feet): 18 Feet Assistive device: Rolling walker (2 wheeled) Gait Pattern/deviations: Step-to pattern;Decreased step length - right;Decreased step length - left;Shuffle;Trunk flexed Gait velocity: decr   General Gait Details: cues for sequence, posture and position from [employer]            Wheelchair Mobility    Modified Rankin (Stroke Patients Only)       Balance                                    Cognition Arousal/Alertness: Awake/alert Behavior During Therapy: WFL for tasks  assessed/performed Overall Cognitive Status: Within Functional Limits for tasks assessed                      Exercises Total Joint Exercises Ankle Circles/Pumps: AROM;Both;15 reps;Supine Quad Sets: AROM;Both;10 reps;Supine Heel Slides: AAROM;10 reps;Supine;Left Straight Leg Raises: AAROM;Left;10 reps;Supine Goniometric ROM: AAROM L knee -10 - 25    General Comments        Pertinent Vitals/Pain Pain Assessment: 0-10 Pain Score: 6  Pain Location: L knee Pain Descriptors / Indicators: Aching;Sore Pain Intervention(s): Limited activity within patient's tolerance;Monitored during session;Premedicated before session;Ice applied    Home Living                      Prior Function            PT Goals (current goals can now be found in the care plan section) Acute Rehab PT Goals Patient Stated Goal: Resume previous lifestyle with decreased pain PT Goal Formulation: With patient Time For Goal Achievement: 08/13/14 Potential to Achieve Goals: Good Progress towards PT goals: Progressing toward goals    Frequency  7X/week    PT Plan Current plan remains appropriate    Co-evaluation             End of Session Equipment Utilized During Treatment: Gait belt;Left knee immobilizer Activity Tolerance: Patient limited by fatigue;Patient limited by pain;Other (comment) Patient left: in bed;with call bell/phone within reach;with family/visitor present  Time: 8453-6468 PT Time Calculation (min) (ACUTE ONLY): 23 min  Charges:  $Gait Training: 8-22 mins $Therapeutic Exercise: 8-22 mins $Therapeutic Activity: 8-22 mins                    G Codes:      Kynzley Dowson 08-23-2014, 4:04 PM

## 2014-08-07 NOTE — Progress Notes (Signed)
Rt Note:  Patient resting comfortably on CPAP, via nasal mask, 5.0 cm H20 with 2 lpm, O2 bleed in.

## 2014-08-07 NOTE — Progress Notes (Signed)
CSW consulted for SNF placement. PN reviewed. PT recommends HHPT following hospital d/c. RNCM will assist with d/c planning. CSW is available to assist with SNF placement if plan changes.  Werner Lean LCSW [phone]

## 2014-08-07 NOTE — Progress Notes (Signed)
OT Cancellation Note  Patient Details Name: [name] MRN: [id number] DOB: 10-19-1945   Cancelled Treatment:    Reason Eval/Treat Not Completed: Other (comment) Pt states his pain is still 7-8/10 after pain meds and he doesn't feel up to working with OT on ADL right now. Will try back next date.  [place], [place] 08/07/2014, 11:59 AM

## 2014-08-07 NOTE — Progress Notes (Signed)
     Subjective: 1 Day Post-Op Procedure(s) (LRB): LEFT TOTAL KNEE ARTHROPLASTY (Left)   Patient reports pain as moderate, pain not controlled well per the patient. No events otherwise throughout the night.   Objective:   VITALS:   Filed Vitals:   08/07/14  BP: 150/83  Pulse: 85  Temp: 98.2 F (36.8 C)   Resp: 18    Dorsiflexion/Plantar flexion intact Incision: dressing C/D/I No cellulitis present Compartment soft  LABS  Recent Labs  08/06/14 1517 08/07/14 0414  HGB 13.0 12.0*  HCT 39.9 35.7*  WBC 4.9 6.7  PLT 118* 120*     Recent Labs  08/06/14 1517 08/07/14 0414  NA  --  133*  K  --  3.9  BUN  --  17  CREATININE 1.14 1.13  GLUCOSE  --  132*     Assessment/Plan: 1 Day Post-Op Procedure(s) (LRB): LEFT TOTAL KNEE ARTHROPLASTY (Left) Increase oxycodone a little and may use APAP along with the Oxy. Foley cath d/c'ed Advance diet Up with therapy D/C IV fluids Discharge home with home health eventually, when ready     [name]   PAC  08/07/2014, 8:41 AM

## 2014-08-08 LAB — CBC
HCT: 33.5 % — ABNORMAL LOW (ref 39.0–52.0)
Hemoglobin: 11.5 g/dL — ABNORMAL LOW (ref 13.0–17.0)
MCH: 31.2 pg (ref 26.0–34.0)
MCHC: 34.3 g/dL (ref 30.0–36.0)
MCV: 90.8 fL (ref 78.0–100.0)
Platelets: 118 10*3/uL — ABNORMAL LOW (ref 150–400)
RBC: 3.69 MIL/uL — ABNORMAL LOW (ref 4.22–5.81)
RDW: 13 % (ref 11.5–15.5)
WBC: 8.1 10*3/uL (ref 4.0–10.5)

## 2014-08-08 LAB — PROTIME-INR
INR: 1.33 (ref 0.00–1.49)
Prothrombin Time: 16.6 seconds — ABNORMAL HIGH (ref 11.6–15.2)

## 2014-08-08 LAB — GLUCOSE, CAPILLARY
Glucose-Capillary: 136 mg/dL — ABNORMAL HIGH (ref 70–99)
Glucose-Capillary: 115 mg/dL — ABNORMAL HIGH (ref 70–99)
Glucose-Capillary: 116 mg/dL — ABNORMAL HIGH (ref 70–99)
Glucose-Capillary: 107 mg/dL — ABNORMAL HIGH (ref 70–99)
Glucose-Capillary: 113 mg/dL — ABNORMAL HIGH (ref 70–99)

## 2014-08-08 MED ORDER — WARFARIN SODIUM 6 MG PO TABS
6.0000 mg | ORAL_TABLET | Freq: Once | ORAL | Status: AC
  Administered 2014-08-08: 6 mg via ORAL
  Filled 2014-08-08: qty 1

## 2014-08-08 NOTE — Plan of Care (Signed)
Problem: Phase III Progression Outcomes Goal: Anticoagulant follow-up in place Outcome: Not Applicable Date Met:  16/10/96 lovenox

## 2014-08-08 NOTE — Progress Notes (Addendum)
Subjective: 2 Days Post-Op Procedure(s) (LRB): LEFT TOTAL KNEE ARTHROPLASTY (Left) Patient reports pain as 4 on 0-10 scale.   No Cp SOB  Objective: Vital signs in last 24 hours: Temp:  [98.1 F (36.7 C)-99.1 F (37.3 C)] 98.1 F (36.7 C) (03/26 0611) Pulse Rate:  [76-89] 88 (03/26 0611) Resp:  [16-18] 18 (03/26 0611) BP: (113-150)/(65-86) 147/86 mmHg (03/26 0611) SpO2:  [93 %-98 %] 93 % (03/26 0611)  Intake/Output from previous day: 03/25 0701 - 03/26 0700 In: 950 [P.O.:860; I.V.:90] Out: 2950 [Urine:2950] Intake/Output this shift:     Recent Labs  08/06/14 1517 08/07/14 0414 08/08/14 0430  HGB 13.0 12.0* 11.5*    Recent Labs  08/07/14 0414 08/08/14 0430  WBC 6.7 8.1  RBC 3.85* 3.69*  HCT 35.7* 33.5*  PLT 120* 118*    Recent Labs  08/06/14 1517 08/07/14 0414  NA  --  133*  K  --  3.9  CL  --  98  CO2  --  27  BUN  --  17  CREATININE 1.14 1.13  GLUCOSE  --  132*  CALCIUM  --  8.2*    Recent Labs  08/07/14 0414 08/08/14 0430  INR 1.31 1.33    Neurologically intact ABD soft Sensation intact distally Dorsiflexion/Plantar flexion intact Incision: scant drainage Dressing changed.  Assessment/Plan: 2 Days Post-Op Procedure(s) (LRB): LEFT TOTAL KNEE ARTHROPLASTY (Left) Advance diet Up with therapy D/C IV fluids Plan for discharge tomorrow if INR up Encourage breathing  Niti Leisure C 08/08/2014, 8:40 AM

## 2014-08-08 NOTE — Progress Notes (Signed)
Physical Therapy Treatment Patient Details Name: [name] MRN: [id number] DOB: 02/28/1946 Today's Date: 08/08/2014    History of Present Illness L TKR    PT Comments    Pt continues ltd by c/o dizziness with OOB activity - BP sitting 106/62, stand 100/64, ambulate short distance 82/55 - RN aware.  Follow Up Recommendations  Home health PT     Equipment Recommendations  None recommended by PT    Recommendations for Other Services OT consult     Precautions / Restrictions Precautions Precautions: Knee;Fall Required Braces or Orthoses: Knee Immobilizer - Left Knee Immobilizer - Left: Discontinue once straight leg raise with < 10 degree lag Restrictions Weight Bearing Restrictions: No LLE Weight Bearing: Weight bearing as tolerated Other Position/Activity Restrictions: WBAT    Mobility  Bed Mobility Overal bed mobility: Needs Assistance Bed Mobility: Supine to Sit;Sit to Supine     Supine to sit: Min assist Sit to supine: Min assist   General bed mobility comments: cues for sequence and use of R LE to self assist  Transfers Overall transfer level: Needs assistance Equipment used: Rolling walker (2 wheeled) Transfers: Sit to/from Stand Sit to Stand: Min assist;Mod assist         General transfer comment: cues for LE management and use of UEs to self assist  Ambulation/Gait Ambulation/Gait assistance: Min assist;+2 safety/equipment Ambulation Distance (Feet): 12 Feet Assistive device: Rolling walker (2 wheeled) Gait Pattern/deviations: Step-to pattern;Decreased step length - right;Decreased step length - left;Shuffle;Trunk flexed Gait velocity: decr   General Gait Details: cues for sequence, posture and position from RW - ltd by increased dizziness   Stairs            Wheelchair Mobility    Modified Rankin (Stroke Patients Only)       Balance                                    Cognition Arousal/Alertness:  Awake/alert Behavior During Therapy: WFL for tasks assessed/performed Overall Cognitive Status: Within Functional Limits for tasks assessed                      Exercises      General Comments        Pertinent Vitals/Pain Pain Assessment: 0-10 Pain Score: 7  Pain Location: L knee Pain Descriptors / Indicators: Aching;Sore Pain Intervention(s): Limited activity within patient's tolerance;Monitored during session;Premedicated before session;Ice applied    Home Living                      Prior Function            PT Goals (current goals can now be found in the care plan section) Acute Rehab PT Goals Patient Stated Goal: Resume previous lifestyle with decreased pain PT Goal Formulation: With patient Time For Goal Achievement: 08/13/14 Potential to Achieve Goals: Good Progress towards PT goals: Progressing toward goals    Frequency  7X/week    PT Plan Current plan remains appropriate    Co-evaluation             End of Session Equipment Utilized During Treatment: Gait belt;Left knee immobilizer Activity Tolerance: Patient limited by pain;Other (comment) (dizziness with OOB activity) Patient left: in bed;with call bell/phone within reach;with family/visitor present     Time: 1594-5859 PT Time Calculation (min) (ACUTE ONLY): 20 min  Charges:  $Gait Training: 8-22  mins                    G Codes:      [name] 27-Aug-2014, 3:55 PM

## 2014-08-08 NOTE — Progress Notes (Signed)
Pt placed himself on CPAP. CPAP is set at [facility] per home settings with 2Lpm of oxygen bled in. Sterile water was added for humidification. Pt was resting comfortably on CPAP. RT will continue to monitor as needed.

## 2014-08-08 NOTE — Care Management Note (Signed)
CARE MANAGEMENT NOTE 08/08/2014  Patient:  [name], [name]   Account Number:  [id number]  Date Initiated:  08/08/2014  Documentation initiated by:  Marylyn Ishihara  Subjective/Objective Assessment:   From home, had Left total Knee Arthroplasty     Action/Plan:   HHPT   Anticipated DC Date:  08/09/2014   Anticipated DC Plan:  [place]  CM consult      Choice offered to / List presented to:  C-1 Patient        [place] arranged  [place] [facility]      Story.   Status of service:  In process, will continue to follow Medicare Important Message given?   (If response is "NO", the following Medicare IM given date fields will be blank) Date Medicare IM given:   Medicare IM given by:   Date Additional Medicare IM given:   Additional Medicare IM given by:    Discharge Disposition:    Per UR Regulation:    If discussed at Long Length of Stay Meetings, dates discussed:    Comments:  08/08/2014  1600  CM spoke with patient and wife in patient's room  about HHPT need and both elected [facility]. Called [phone] and [phone], left message about need. Wife plans to support and supervise patient after discharge. Marylyn Ishihara, RN, BSN,  CCM

## 2014-08-08 NOTE — Progress Notes (Signed)
Physical Therapy Treatment Patient Details Name: [name] MRN: [id number] DOB: [birth year] Today's Date: 08/08/2014    History of Present Illness L TKR    PT Comments    Pt ltd this session by c/o dizziness - BP sitting 125/73, stand 111/93, after transfer to chair 93/65.  RN aware  Follow Up Recommendations  Home health PT     Equipment Recommendations  None recommended by PT    Recommendations for Other Services OT consult     Precautions / Restrictions Precautions Precautions: Knee;Fall Required Braces or Orthoses: Knee Immobilizer - Left Knee Immobilizer - Left: Discontinue once straight leg raise with < 10 degree lag Restrictions Weight Bearing Restrictions: No LLE Weight Bearing: Weight bearing as tolerated    Mobility  Bed Mobility Overal bed mobility: Needs Assistance Bed Mobility: Supine to Sit     Supine to sit: Min assist;Mod assist     General bed mobility comments: cues for sequence and use of R LE to self assist  Transfers Overall transfer level: Needs assistance Equipment used: Rolling walker (2 wheeled) Transfers: Sit to/from Stand Sit to Stand: Min assist;Mod assist         General transfer comment: cues for LE management and use of UEs to self assist  Ambulation/Gait Ambulation/Gait assistance: Min assist;Mod assist Ambulation Distance (Feet): 5 Feet Assistive device: Rolling walker (2 wheeled) Gait Pattern/deviations: Step-to pattern;Decreased step length - right;Decreased step length - left;Shuffle;Trunk flexed Gait velocity: decr   General Gait Details: cues for sequence, posture and position from RW - ltd by increased dizziness   Stairs            Wheelchair Mobility    Modified Rankin (Stroke Patients Only)       Balance                                    Cognition Arousal/Alertness: Awake/alert Behavior During Therapy: WFL for tasks assessed/performed Overall Cognitive Status: Within  Functional Limits for tasks assessed                      Exercises Total Joint Exercises Ankle Circles/Pumps: AROM;Both;15 reps;Supine Quad Sets: AROM;Both;10 reps;Supine Heel Slides: AAROM;10 reps;Supine;Left Straight Leg Raises: AAROM;Left;10 reps;Supine Goniometric ROM: AAROM L knee -10-  25 - pain ltd with ++ muscle guarding    General Comments        Pertinent Vitals/Pain Pain Assessment: 0-10 Pain Score: 6  Pain Location: L knee Pain Descriptors / Indicators: Aching;Sore Pain Intervention(s): Limited activity within patient's tolerance;Monitored during session;Premedicated before session;Ice applied    Home Living                      Prior Function            PT Goals (current goals can now be found in the care plan section) Acute Rehab PT Goals Patient Stated Goal: Resume previous lifestyle with decreased pain PT Goal Formulation: With patient Time For Goal Achievement: 08/13/14 Potential to Achieve Goals: Good Progress towards PT goals: Not progressing toward goals - comment (orthostatic with mobility)    Frequency  7X/week    PT Plan Current plan remains appropriate    Co-evaluation             End of Session Equipment Utilized During Treatment: Gait belt;Left knee immobilizer Activity Tolerance: Patient limited by pain;Other (comment) (dizziness with mobility) Patient  left: in chair;with call bell/phone within reach;with family/visitor present     Time: 1140-1202 PT Time Calculation (min) (ACUTE ONLY): 22 min  Charges:  $Gait Training: 8-22 mins $Therapeutic Exercise: 23-37 mins                    G Codes:      [name] 09-Aug-2014, 12:58 PM

## 2014-08-08 NOTE — Progress Notes (Signed)
[place] for warfarin Indication: atrial fibrillation  Allergies  Allergen Reactions  . Sulfa Drugs Cross Reactors Other (See Comments)  . Tetracyclines & Related Other (See Comments)  . Contrast Media [Iodinated Diagnostic Agents] Rash   Patient Measurements: Height: 6' (182.9 cm) Weight: 225 lb (102.059 kg) IBW/kg (Calculated) : 77.6  Labs:  Recent Labs  08/06/14 0830  08/06/14 1517 08/07/14 0414 08/08/14 0430  HGB  --   < > 13.0 12.0* 11.5*  HCT  --   --  39.9 35.7* 33.5*  PLT  --   --  118* 120* 118*  LABPROT 15.5*  --   --  16.5* 16.6*  INR 1.22  --   --  1.31 1.33  CREATININE  --   --  1.14 1.13  --   < > = values in this interval not displayed.  Estimated Creatinine Clearance: 75.2 mL/min (by C-G formula based on Cr of 1.13).  Medical History: Past Medical History  Diagnosis Date  . Palpitations   . Diabetes mellitus   . Hypercholesteremia   . Hypertension   . Depression   . Hx of echocardiogram     Echo 3/16:  Mild LVH, EF 60-65%, mild MR, severe LAE, mild RAE, PASP 32 mmHg  . Prostate cancer     seed implantion- 15 to 20 yrs ago  . Arrhythmia     hx. Atrial Fib. ,"with regurgitation"  . Heart murmur   . Sleep apnea     uses CPAP-settings 5  . Ureter, stricture     hx. 2 yrs ago-stent has been removed now   Medications:  Scheduled:  . acidophilus  1 capsule Oral Daily  . allopurinol  300 mg Oral Daily  . atenolol  25 mg Oral Daily  . docusate sodium  100 mg Oral BID  . enoxaparin (LOVENOX) injection  30 mg Subcutaneous Q12H  . insulin aspart  0-15 Units Subcutaneous TID WC  . multivitamin-lutein  1 capsule Oral Daily  . traMADol  100 mg Oral Daily  . Warfarin - Pharmacist Dosing Inpatient   Does not apply q1800   Assessment: 69 y/o M on chronic warfarin for atrial fibrillation, underwent L TKA on 08/06/14.  Warfarin had been interrupted for surgery (last taken 3/18).  Orders received to resume warfarin  postoperatively with pharmacy dosing assistance, and to begin Lovenox 30 mg SQ q12h starting the morning of POD#1.  Patient's most recent warfarin dosage was reportedly 4 mg daily except 6 mg on Wednesdays and Fridays.  Drug interactions:  allopurinol can increase INR response to warfarin, but patient was on this PTA.  Goal of Therapy:  INR 2-3 Monitor platelets by anticoagulation protocol: Yes   Today, 08/08/2014: Small INR response, after two doses warfarin (see MAR for dosages) Hgb down slightly consistent with post-op blood loss and hydration Pltc slightly low, but stable and >100K No bleeding reported in chart note Diet: carb modified Drug interactions: allopurinol can increase INR response to warfarin, but patient on this PTA.  Plan: 1. Warfarin 6 mg PO x 1 today at 1000 2. PT/INR daily while inpatient. 3. Lovenox 30 mg SQ q12h as ordered by ortho until INR response sufficient  Minda Ditto PharmD Pager [phone] 08/08/2014, 8:29 AM

## 2014-08-08 NOTE — Progress Notes (Signed)
Physical Therapy Treatment Patient Details Name: [name] MRN: [id number] DOB: 1945-09-20 Today's Date: 08-25-2014    History of Present Illness L TKR    PT Comments    OOB deferred - pt requiring break following therex - pain ltd.  Follow Up Recommendations  Home health PT     Equipment Recommendations  None recommended by PT    Recommendations for Other Services OT consult     Precautions / Restrictions Precautions Precautions: Knee;Fall Required Braces or Orthoses: Knee Immobilizer - Left Knee Immobilizer - Left: Discontinue once straight leg raise with < 10 degree lag Restrictions Weight Bearing Restrictions: No LLE Weight Bearing: Weight bearing as tolerated    Mobility  Bed Mobility                  Transfers                    Ambulation/Gait                 Stairs            Wheelchair Mobility    Modified Rankin (Stroke Patients Only)       Balance                                    Cognition Arousal/Alertness: Lethargic;Suspect due to medications Behavior During Therapy: [facility] for tasks assessed/performed Overall Cognitive Status: Within Functional Limits for tasks assessed                      Exercises Total Joint Exercises Ankle Circles/Pumps: AROM;Both;15 reps;Supine Quad Sets: AROM;Both;10 reps;Supine Heel Slides: AAROM;10 reps;Supine;Left Straight Leg Raises: AAROM;Left;10 reps;Supine Goniometric ROM: AAROM L knee -10-  25 - pain ltd with ++ muscle guarding    General Comments        Pertinent Vitals/Pain Pain Assessment: 0-10 Pain Score: 8  Pain Location: L knee Pain Descriptors / Indicators: Aching;Sore Pain Intervention(s): Limited activity within patient's tolerance;Monitored during session;Premedicated before session;Ice applied    Home Living                      Prior Function            PT Goals (current goals can now be found in the care plan  section) Acute Rehab PT Goals Patient Stated Goal: Resume previous lifestyle with decreased pain PT Goal Formulation: With patient Time For Goal Achievement: 08/13/14 Potential to Achieve Goals: Good Progress towards PT goals: Progressing toward goals    Frequency  7X/week    PT Plan Current plan remains appropriate    Co-evaluation             End of Session Equipment Utilized During Treatment: Gait belt;Left knee immobilizer Activity Tolerance: Patient limited by lethargy;Patient limited by pain;Patient limited by fatigue Patient left: in bed;with call bell/phone within reach;with family/visitor present     Time: 6294-7654 PT Time Calculation (min) (ACUTE ONLY): 24 min  Charges:  $Therapeutic Exercise: 23-37 mins                    G Codes:      Tejas [place] 2014-08-25, 12:53 PM

## 2014-08-08 NOTE — Progress Notes (Signed)
OT Cancellation Note  Patient Details Name: INDALECIO MALMSTROM MRN: [id number] DOB: 25-Jul-1945   Cancelled Treatment:    Reason Eval/Treat Not Completed: Other (comment). Pt was orthostatic with PT earlier and is back in bed. Will check back tomorrow am. . [place]ka Wainwright 08/08/2014, 2:45 PM  Lesle Chris, OTR/L [phone] 08/08/2014

## 2014-08-09 LAB — CBC
HCT: 31 % — ABNORMAL LOW (ref 39.0–52.0)
Hemoglobin: 10.7 g/dL — ABNORMAL LOW (ref 13.0–17.0)
MCH: 31.2 pg (ref 26.0–34.0)
MCHC: 34.5 g/dL (ref 30.0–36.0)
MCV: 90.4 fL (ref 78.0–100.0)
Platelets: 123 10*3/uL — ABNORMAL LOW (ref 150–400)
RBC: 3.43 MIL/uL — ABNORMAL LOW (ref 4.22–5.81)
RDW: 13.1 % (ref 11.5–15.5)
WBC: 6.8 10*3/uL (ref 4.0–10.5)

## 2014-08-09 LAB — GLUCOSE, CAPILLARY
Glucose-Capillary: 91 mg/dL (ref 70–99)
Glucose-Capillary: 100 mg/dL — ABNORMAL HIGH (ref 70–99)
Glucose-Capillary: 108 mg/dL — ABNORMAL HIGH (ref 70–99)
Glucose-Capillary: 107 mg/dL — ABNORMAL HIGH (ref 70–99)

## 2014-08-09 LAB — PROTIME-INR
INR: 1.37 (ref 0.00–1.49)
Prothrombin Time: 17.1 seconds — ABNORMAL HIGH (ref 11.6–15.2)

## 2014-08-09 MED ORDER — POLYETHYLENE GLYCOL 3350 17 G PO PACK
17.0000 g | PACK | Freq: Every day | ORAL | Status: DC | PRN
  Administered 2014-08-09: 17 g via ORAL
  Filled 2014-08-09: qty 1

## 2014-08-09 MED ORDER — DOCUSATE SODIUM 100 MG PO CAPS: 100.0000 mg | ORAL_CAPSULE | Freq: Two times a day (BID) | ORAL | Status: DC

## 2014-08-09 MED ORDER — HYDROCODONE-ACETAMINOPHEN 7.5-325 MG PO TABS
1.0000 | ORAL_TABLET | ORAL | Status: DC | PRN
  Administered 2014-08-09 – 2014-08-10 (×4): 2 via ORAL
  Filled 2014-08-09 (×4): qty 2

## 2014-08-09 MED ORDER — WARFARIN SODIUM 6 MG PO TABS
6.0000 mg | ORAL_TABLET | Freq: Once | ORAL | Status: AC
  Administered 2014-08-09: 6 mg via ORAL
  Filled 2014-08-09: qty 1

## 2014-08-09 MED ORDER — TRAMADOL HCL 50 MG PO TABS
50.0000 mg | ORAL_TABLET | Freq: Four times a day (QID) | ORAL | Status: DC | PRN
  Administered 2014-08-09 (×2): 100 mg via ORAL
  Filled 2014-08-09 (×2): qty 2

## 2014-08-09 MED ORDER — BISACODYL 10 MG RE SUPP: 10.0000 mg | Freq: Every day | RECTAL | Status: DC | PRN

## 2014-08-09 MED ORDER — SODIUM CHLORIDE 0.9 % IV BOLUS (SEPSIS)
500.0000 mL | Freq: Once | INTRAVENOUS | Status: AC
  Administered 2014-08-09: 500 mL via INTRAVENOUS

## 2014-08-09 MED ORDER — SODIUM CHLORIDE 0.9 % IV BOLUS (SEPSIS)
250.0000 mL | Freq: Once | INTRAVENOUS | Status: AC
  Administered 2014-08-09: 250 mL via INTRAVENOUS

## 2014-08-09 NOTE — Progress Notes (Signed)
Physical Therapy Treatment Patient Details Name: [name] MRN: [id number] DOB: 1946/01/29 Today's Date: 08/09/2014    History of Present Illness L TKR    PT Comments    Pt continues ltd by orthostatic hypotension.  BP in recliner 126/76; sitting feet down 87/63; standing - unable to assess with pt dizziness increasing and unable to stand long enough.  RN aware.  Follow Up Recommendations  Home health PT     Equipment Recommendations  None recommended by PT    Recommendations for Other Services OT consult     Precautions / Restrictions Precautions Precautions: Knee;Fall Required Braces or Orthoses: Knee Immobilizer - Left Knee Immobilizer - Left: Discontinue once straight leg raise with < 10 degree lag Restrictions Weight Bearing Restrictions: No LLE Weight Bearing: Weight bearing as tolerated    Mobility  Bed Mobility Overal bed mobility: Needs Assistance Bed Mobility: Sit to Supine       Sit to supine: Min assist   General bed mobility comments: cues for sequence; assist for RLE  Transfers Overall transfer level: Needs assistance Equipment used: Rolling walker (2 wheeled) Transfers: Sit to/from Stand Sit to Stand: Min assist         General transfer comment: cues for UE placement and for LE placement--when sitting  Ambulation/Gait Ambulation/Gait assistance: Min assist Ambulation Distance (Feet): 5 Feet Assistive device: Rolling walker (2 wheeled) Gait Pattern/deviations: Step-to pattern;Decreased step length - right;Decreased step length - left;Shuffle Gait velocity: decr   General Gait Details: cues for sequence, posture and position from RW - ltd by increased dizziness   Stairs            Wheelchair Mobility    Modified Rankin (Stroke Patients Only)       Balance                                    Cognition Arousal/Alertness: Awake/alert Behavior During Therapy: WFL for tasks assessed/performed Overall Cognitive  Status: Within Functional Limits for tasks assessed                      Exercises      General Comments        Pertinent Vitals/Pain Pain Assessment: 0-10 Pain Score: 6  Pain Location: L knee Pain Descriptors / Indicators: Aching;Sore Pain Intervention(s): Limited activity within patient's tolerance;Monitored during session;Premedicated before session;Ice applied    Home Living                      Prior Function            PT Goals (current goals can now be found in the care plan section) Acute Rehab PT Goals Patient Stated Goal: Resume previous lifestyle with decreased pain.  Pt enjoys playing the saxophone PT Goal Formulation: With patient Time For Goal Achievement: 08/13/14 Potential to Achieve Goals: Good Progress towards PT goals: Progressing toward goals    Frequency  7X/week    PT Plan Current plan remains appropriate    Co-evaluation             End of Session Equipment Utilized During Treatment: Gait belt;Left knee immobilizer Activity Tolerance: Other (comment) (dizziness) Patient left: in bed;with call bell/phone within reach;with family/visitor present     Time: 4665-9935 PT Time Calculation (min) (ACUTE ONLY): 23 min  Charges:  $Therapeutic Activity: 8-22 mins  G Codes:      [name] 08-30-14, 3:04 PM

## 2014-08-09 NOTE — Progress Notes (Signed)
Pt continuing to have orthostatic reactions in BP. D.Perkins, PA, notified & orders received. Letisia Schwalb, [employer]

## 2014-08-09 NOTE — Progress Notes (Signed)
Subjective: 3 Days Post-Op Procedure(s) (LRB): LEFT TOTAL KNEE ARTHROPLASTY (Left) Patient reports pain as mild and moderate.   Patient seen in rounds with [name].  Family in room. They are concerned that he is not able to ambulate due top pressure issues.  Therapy states that patient was orthostatic yesterday with attempts at therapy.   Session 1 - Pt ltd this session by c/o dizziness - BP sitting 125/73, stand 111/93, after transfer to chair 93/65. RN aware Session 2 - Pt continues ltd by c/o dizziness with OOB activity - BP sitting 106/62, stand 100/64, ambulate short distance 82/55 - RN aware. Not ready to go home toady.  Will keep today and check orthostatic vitals this morning.  Review his meds.  Pulse noted to be in the 80's this morning. Patient is having problems with pain in the knee, requiring pain medications Plan is to go Home after hospital stay.  Objective: Vital signs in last 24 hours: Temp:  [97.9 F (36.6 C)-99.1 F (37.3 C)] 98.6 F (37 C) (03/27 0440) Pulse Rate:  [70-91] 84 (03/27 0440) Resp:  [18-20] 20 (03/27 0440) BP: (108-120)/(62-69) 116/62 mmHg (03/27 0440) SpO2:  [92 %-98 %] 95 % (03/27 0440)  Intake/Output from previous day:  Intake/Output Summary (Last 24 hours) at 08/09/14 0834 Last data filed at 08/09/14 0730  Gross per 24 hour  Intake    880 ml  Output   2100 ml  Net  -1220 ml    Intake/Output this shift: UOP over 500 since around MN as per chart. Overall totals show positive levels but over a liter out past 24 hours. Diuresing well.  Labs:  Recent Labs  08/06/14 1517 08/07/14 0414 08/08/14 0430 08/09/14 0438  HGB 13.0 12.0* 11.5* 10.7*    Recent Labs  08/08/14 0430 08/09/14 0438  WBC 8.1 6.8  RBC 3.69* 3.43*  HCT 33.5* 31.0*  PLT 118* 123*    Recent Labs  08/06/14 1517 08/07/14 0414  NA  --  133*  K  --  3.9  CL  --  98  CO2  --  27  BUN  --  17  CREATININE 1.14 1.13  GLUCOSE  --  132*  CALCIUM  --  8.2*      Recent Labs  08/08/14 0430 08/09/14 0438  INR 1.33 1.37    EXAM General - Patient is Alert, Appropriate and but in mild distress when attempting to examine the leg Extremity - Neurovascular intact Sensation intact distally Dorsiflexion/Plantar flexion intact Dressing/Incision - clean, dry Motor Function - intact, moving foot and toes well on exam.  Noted to have some edema in the leg below the knee (encouraged ankle pumps)  Past Medical History  Diagnosis Date  . Palpitations   . Diabetes mellitus   . Hypercholesteremia   . Hypertension   . Depression   . Hx of echocardiogram     Echo 3/16:  Mild LVH, EF 60-65%, mild MR, severe LAE, mild RAE, PASP 32 mmHg  . Prostate cancer     seed implantion- 15 to 20 yrs ago  . Arrhythmia     hx. Atrial Fib. ,"with regurgitation"  . Heart murmur   . Sleep apnea     uses CPAP-settings 5  . Ureter, stricture     hx. 2 yrs ago-stent has been removed now    Assessment/Plan: 3 Days Post-Op Procedure(s) (LRB): LEFT TOTAL KNEE ARTHROPLASTY (Left) Principal Problem:   Primary osteoarthritis of left knee Active Problems:  Left knee DJD  Estimated body mass index is 30.51 kg/(m^2) as calculated from the following:   Height as of this encounter: 6' (1.829 m).   Weight as of this encounter: 102.059 kg (225 lb).  Check orthostatics Fluids Recheck labs in AM Adjust pain meds.  DVT Prophylaxis - Lovenox and Coumadin Weight-Bearing as tolerated to left leg  Arlee Muslim, PA-C Orthopaedic Surgery 08/09/2014, 8:34 AM

## 2014-08-09 NOTE — Progress Notes (Signed)
[place] for warfarin Indication: atrial fibrillation, VTE prophylaxis  Allergies  Allergen Reactions  . Sulfa Drugs Cross Reactors Other (See Comments)  . Tetracyclines & Related Other (See Comments)  . Contrast Media [Iodinated Diagnostic Agents] Rash   Patient Measurements: Height: 6' (182.9 cm) Weight: 225 lb (102.059 kg) IBW/kg (Calculated) : 77.6  Labs:  Recent Labs  08/06/14 1517 08/07/14 0414 08/08/14 0430 08/09/14 0438  HGB 13.0 12.0* 11.5* 10.7*  HCT 39.9 35.7* 33.5* 31.0*  PLT 118* 120* 118* 123*  LABPROT  --  16.5* 16.6* 17.1*  INR  --  1.31 1.33 1.37  CREATININE 1.14 1.13  --   --     Estimated Creatinine Clearance: 75.2 mL/min (by C-G formula based on Cr of 1.13).  Medications:  Scheduled:  . acidophilus  1 capsule Oral Daily  . allopurinol  300 mg Oral Daily  . atenolol  25 mg Oral Daily  . docusate sodium  100 mg Oral BID  . enoxaparin (LOVENOX) injection  30 mg Subcutaneous Q12H  . insulin aspart  0-15 Units Subcutaneous TID WC  . multivitamin-lutein  1 capsule Oral Daily  . sodium chloride  250 mL Intravenous Once  . Warfarin - Pharmacist Dosing Inpatient   Does not apply q1800   Assessment: 69 y/o M on chronic warfarin for atrial fibrillation, underwent L TKA on 08/06/14.  Warfarin had been interrupted for surgery (last taken 3/18).  Orders received to resume warfarin postoperatively with pharmacy dosing assistance, and to begin Lovenox 30 mg SQ q12h starting the morning of POD#1.  Patient's most recent warfarin dosage was reportedly 4 mg daily except 6 mg on Wednesdays and Fridays.  Today, 08/09/2014:  Small INR response, after three doses warfarin (see MAR for dosages)  Hgb down slightly consistent with post-op blood loss and hydration  Pltc slightly low, but stable and >100K  No bleeding reported in chart note  Diet: carb modified  Drug interactions: allopurinol can increase INR response to  warfarin, but patient on this PTA.   Goal of Therapy:  INR 2-3 Monitor platelets by anticoagulation protocol: Yes   Plan: 1. Repeat warfarin 6 mg PO x 1 today at 1800 2. PT/INR daily while inpatient. 3. Lovenox 30 mg SQ q12h as ordered by ortho until INR response sufficient  Peggyann Juba, PharmD, BCPS Pager: [phone]  08/09/2014, 9:54 AM

## 2014-08-09 NOTE — Evaluation (Signed)
Occupational Therapy Evaluation Patient Details Name: [name] MRN: [id number] DOB: 28-Feb-1946 Today's Date: 08/09/2014    History of Present Illness L TKR   Clinical Impression   This 69 year old man was admitted for the above surgery.  He will benefit from skilled OT in acute to increase safety and independence with adls and bathroom transfers.  Pt was independent prior to admission and wife plans to assist him at home.  Goals are for overall min guard level.  Pt was limited during evaluation by BP    Follow Up Recommendations  No OT follow up    Equipment Recommendations  None recommended by OT    Recommendations for Other Services       Precautions / Restrictions Precautions Precautions: Knee;Fall Required Braces or Orthoses: Knee Immobilizer - Left Knee Immobilizer - Left: Discontinue once straight leg raise with < 10 degree lag Restrictions LLE Weight Bearing: Weight bearing as tolerated      Mobility Bed Mobility         Supine to sit: Min assist     General bed mobility comments: cues for sequence; assist for RLE  Transfers   Equipment used: Rolling walker (2 wheeled) Transfers: Sit to/from Stand Sit to Stand: Min assist         General transfer comment: cues for UE placement and for LE placement--when sitting    Balance                                            ADL Overall ADL's : Needs assistance/impaired                                       General ADL Comments: Wife will assist with LB ADLs; they are not interested in AE.  Reviewed tub readiness--pt has a small borrowed shower chair:  educated on readiness. He also has borrowed a 3:1 commode.  Ambulated in hall and pt became dizzy and had to sit: did not practice 3:1 commode transfer on this visit.  See vital signs section of chart.  Pt is able to perform UB adls with set up only.       Vision     Perception     Praxis      Pertinent  Vitals/Pain Pain Score: 7  Pain Location: L knee Pain Descriptors / Indicators: Aching Pain Intervention(s): Limited activity within patient's tolerance     Hand Dominance     Extremity/Trunk Assessment Upper Extremity Assessment Upper Extremity Assessment: Overall WFL for tasks assessed           Communication Communication Communication: No difficulties   Cognition Arousal/Alertness: Awake/alert Behavior During Therapy: WFL for tasks assessed/performed Overall Cognitive Status: Within Functional Limits for tasks assessed                     General Comments       Exercises       Shoulder Instructions      Home Living Family/patient expects to be discharged to:: Private residence Living Arrangements: Spouse/significant other Available Help at Discharge: Family               Bathroom Shower/Tub: Tub/shower unit Shower/tub characteristics: Architectural technologist: Standard     Home Equipment: Bedside commode;Shower  seat   Additional Comments: they have borrowed bathroom equipment and some kind of strap to assist with getting leg OOB      Prior Functioning/Environment Level of Independence: Independent             OT Diagnosis: Generalized weakness;Acute pain   OT Problem List: Decreased strength;Decreased activity tolerance;Cardiopulmonary status limiting activity;Decreased knowledge of use of DME or AE;Pain   OT Treatment/Interventions: Self-care/ADL training;DME and/or AE instruction;Patient/family education    OT Goals(Current goals can be found in the care plan section) Acute Rehab OT Goals Patient Stated Goal: Resume previous lifestyle with decreased pain.  Pt enjoys playing the saxophone OT Goal Formulation: With patient Time For Goal Achievement: 08/16/14 Potential to Achieve Goals: Good ADL Goals Pt Will Perform Grooming: with min guard assist;standing Pt Will Transfer to Toilet: with min guard assist;ambulating;bedside  commode Pt Will Perform Toileting - Clothing Manipulation and hygiene: with min guard assist;sit to/from stand Additional ADL Goal #1: Pt/wife will be independent with bed mobility and LB adls.  OT Frequency: Min 2X/week   Barriers to D/C:            Co-evaluation PT/OT/SLP Co-Evaluation/Treatment: Yes Reason for Co-Treatment: For patient/therapist safety PT goals addressed during session: Mobility/safety with mobility OT goals addressed during session: ADL's and self-care      End of Session    Activity Tolerance: Treatment limited secondary to medical complications (Comment) (BP) Patient left:  In chair, with call light nearby and family present in room   Time: [phone] OT Time Calculation (min): 27 min Charges:  OT General Charges $OT Visit: 1 Procedure OT Evaluation $Initial OT Evaluation Tier I: 1 Procedure G-Codes:    Rilea Arutyunyan 08-25-14, 12:01 PM  Lesle Chris, OTR/L [phone] 2014-08-25

## 2014-08-09 NOTE — Progress Notes (Signed)
Physical Therapy Treatment Patient Details Name: [name] MRN: [id number] DOB: August 25, 1945 Today's Date: 08/09/2014    History of Present Illness L TKR    PT Comments    Pt feeling better but continues orthostatic with OOB mobility.  BPs recorded in vital signs and provided to RN.  Follow Up Recommendations  Home health PT     Equipment Recommendations  None recommended by PT    Recommendations for Other Services OT consult     Precautions / Restrictions Precautions Precautions: Knee;Fall Required Braces or Orthoses: Knee Immobilizer - Left Knee Immobilizer - Left: Discontinue once straight leg raise with < 10 degree lag Restrictions Weight Bearing Restrictions: No LLE Weight Bearing: Weight bearing as tolerated Other Position/Activity Restrictions: WBAT    Mobility  Bed Mobility Overal bed mobility: Needs Assistance Bed Mobility: Supine to Sit     Supine to sit: Min assist     General bed mobility comments: cues for sequence; assist for RLE  Transfers Overall transfer level: Needs assistance Equipment used: Rolling walker (2 wheeled) Transfers: Sit to/from Stand Sit to Stand: Min assist         General transfer comment: cues for UE placement and for LE placement--when sitting  Ambulation/Gait Ambulation/Gait assistance: Min assist;+2 safety/equipment (chair follow) Ambulation Distance (Feet): 15 Feet Assistive device: Rolling walker (2 wheeled) Gait Pattern/deviations: Step-to pattern;Decreased step length - right;Decreased step length - left;Shuffle;Trunk flexed Gait velocity: decr   General Gait Details: cues for sequence, posture and position from RW - ltd by increased dizziness   Stairs            Wheelchair Mobility    Modified Rankin (Stroke Patients Only)       Balance                                    Cognition Arousal/Alertness: Awake/alert Behavior During Therapy: WFL for tasks assessed/performed Overall  Cognitive Status: Within Functional Limits for tasks assessed                      Exercises Total Joint Exercises Ankle Circles/Pumps: AROM;Both;15 reps;Supine Quad Sets: AROM;Both;Supine;15 reps Heel Slides: AAROM;Supine;Left;20 reps Straight Leg Raises: AAROM;Left;Supine;20 reps Goniometric ROM: AAROM at L knee -10 - 35    General Comments        Pertinent Vitals/Pain Pain Assessment: 0-10 Pain Score: 7  Pain Location: L knee Pain Descriptors / Indicators: Aching;Sore Pain Intervention(s): Limited activity within patient's tolerance;Monitored during session;Premedicated before session;Ice applied    Home Living Family/patient expects to be discharged to:: Private residence Living Arrangements: Spouse/significant other Available Help at Discharge: Family         Home Equipment: Bedside commode;Shower seat Additional Comments: they have borrowed bathroom equipment and some kind of strap to assist with getting leg OOB    Prior Function Level of Independence: Independent          PT Goals (current goals can now be found in the care plan section) Acute Rehab PT Goals Patient Stated Goal: Resume previous lifestyle with decreased pain.  Pt enjoys playing the saxophone PT Goal Formulation: With patient Time For Goal Achievement: 08/13/14 Potential to Achieve Goals: Good Progress towards PT goals: Progressing toward goals    Frequency  7X/week    PT Plan Current plan remains appropriate    Co-evaluation PT/OT/SLP Co-Evaluation/Treatment: Yes Reason for Co-Treatment: For patient/therapist safety PT goals addressed during  session: Mobility/safety with mobility OT goals addressed during session: ADL's and self-care     End of Session Equipment Utilized During Treatment: Gait belt;Left knee immobilizer Activity Tolerance: Patient limited by fatigue;Other (comment) (orthostatic) Patient left: in chair;with call bell/phone within reach;with family/visitor  present     Time: 9326-7124 PT Time Calculation (min) (ACUTE ONLY): 46 min  Charges:  $Gait Training: 8-22 mins $Therapeutic Exercise: 8-22 mins $Therapeutic Activity: 8-22 mins                    G Codes:      [name] 09/08/2014, 12:16 PM

## 2014-08-09 NOTE — Progress Notes (Signed)
Pt already on CPAP when RT checked on Pt.  Current settings are 5 CMH20 per Pt home settings with 2 LPM O2 bleed in, Pt tolerating well at this time.  RT to monitor and assess as needed.

## 2014-08-10 LAB — BASIC METABOLIC PANEL
Anion gap: 8 (ref 5–15)
BUN: 20 mg/dL (ref 6–23)
CO2: 29 mmol/L (ref 19–32)
Calcium: 8.1 mg/dL — ABNORMAL LOW (ref 8.4–10.5)
Chloride: 95 mmol/L — ABNORMAL LOW (ref 96–112)
Creatinine, Ser: 0.99 mg/dL (ref 0.50–1.35)
GFR calc Af Amer: >90 mL/min (ref >90)
GFR calc non Af Amer: 81 mL/min — ABNORMAL LOW (ref >90)
Glucose, Bld: 122 mg/dL — ABNORMAL HIGH (ref 70–99)
Potassium: 3.7 mmol/L (ref 3.5–5.1)
Sodium: 132 mmol/L — ABNORMAL LOW (ref 135–145)

## 2014-08-10 LAB — CBC
HCT: 29.7 % — ABNORMAL LOW (ref 39.0–52.0)
Hemoglobin: 10 g/dL — ABNORMAL LOW (ref 13.0–17.0)
MCH: 30.6 pg (ref 26.0–34.0)
MCHC: 33.7 g/dL (ref 30.0–36.0)
MCV: 90.8 fL (ref 78.0–100.0)
Platelets: 127 10*3/uL — ABNORMAL LOW (ref 150–400)
RBC: 3.27 MIL/uL — ABNORMAL LOW (ref 4.22–5.81)
RDW: 13 % (ref 11.5–15.5)
WBC: 5.4 10*3/uL (ref 4.0–10.5)

## 2014-08-10 LAB — GLUCOSE, CAPILLARY
Glucose-Capillary: 103 mg/dL — ABNORMAL HIGH (ref 70–99)
Glucose-Capillary: 85 mg/dL (ref 70–99)

## 2014-08-10 LAB — PROTIME-INR
INR: 1.41 (ref 0.00–1.49)
Prothrombin Time: 17.4 seconds — ABNORMAL HIGH (ref 11.6–15.2)

## 2014-08-10 MED ORDER — ENOXAPARIN (LOVENOX) PATIENT EDUCATION KIT
PACK | Freq: Once | Status: AC
  Administered 2014-08-10: 08:00:00
  Filled 2014-08-10: qty 1

## 2014-08-10 MED ORDER — PROSIGHT PO TABS
1.0000 | ORAL_TABLET | Freq: Every day | ORAL | Status: DC
  Filled 2014-08-10: qty 1

## 2014-08-10 MED ORDER — ENOXAPARIN SODIUM 40 MG/0.4ML ~~LOC~~ SOLN: 40.0000 mg | SUBCUTANEOUS | Status: DC

## 2014-08-10 MED ORDER — HYDROCODONE-ACETAMINOPHEN 7.5-325 MG PO TABS: 1.0000 | ORAL_TABLET | ORAL | Status: DC | PRN

## 2014-08-10 MED ORDER — WARFARIN SODIUM 4 MG PO TABS
4.0000 mg | ORAL_TABLET | Freq: Once | ORAL | Status: DC
  Filled 2014-08-10: qty 1

## 2014-08-10 NOTE — Progress Notes (Signed)
Physical Therapy Treatment Patient Details Name: [name] MRN: [id number] DOB: Aug 28, 1945 Today's Date: 08/10/2014    History of Present Illness L TKR    PT Comments    POD # 4 pm session.  Pt fatigued from OT session so finished educating and performing TKR TE's on handout we didn't get to this morning.  Pt ready for D/C to home and spouse educated on all aspects of HEP and mobility.   Follow Up Recommendations  Home health PT     Equipment Recommendations  None recommended by PT    Recommendations for Other Services       Precautions / Restrictions Precautions Precautions: Knee;Fall Precaution Comments: monitor BP Required Braces or Orthoses: Knee Immobilizer - Left Knee Immobilizer - Left: Discontinue once straight leg raise with < 10 degree lag Restrictions Weight Bearing Restrictions: No LLE Weight Bearing: Weight bearing as tolerated Other Position/Activity Restrictions: WBAT    Mobility  Bed Mobility Overal bed mobility: Needs Assistance Bed Mobility: Supine to Sit     Supine to sit: Min assist     General bed mobility comments: cues for sequence; assist for RLE  Transfers Overall transfer level: Needs assistance Equipment used: Rolling walker (2 wheeled) Transfers: Sit to/from Stand Sit to Stand: Min guard;Min assist         General transfer comment: verbal cues hand placement. Min assist when becoming lightheaded. Educated wife on how to assist.   Ambulation/Gait Ambulation/Gait assistance: Supervision;Min guard Ambulation Distance (Feet): 45 Feet Assistive device: Rolling walker (2 wheeled) Gait Pattern/deviations: Step-to pattern;Decreased stance time - left Gait velocity: decreased   General Gait Details: cues for sequence, posture and position from RW - l   Stairs Stairs: Yes Stairs assistance: Min assist Stair Management: No rails;Backwards;With walker;Step to pattern Number of Stairs: 2 (performed twice with spouse) General  stair comments: 25% VC's on proper tech, sequencing and safety with pt and spouse.   Wheelchair Mobility    Modified Rankin (Stroke Patients Only)       Balance                                    Cognition Arousal/Alertness: Awake/alert Behavior During Therapy: Anxious Overall Cognitive Status: Within Functional Limits for tasks assessed                      Exercises      General Comments        Pertinent Vitals/Pain Pain Assessment: 0-10 Pain Score: 8  Pain Location: L knee Pain Descriptors / Indicators: Aching;Tightness;Sore Pain Intervention(s): Monitored during session;Premedicated before session;Repositioned;Ice applied    Home Living                      Prior Function            PT Goals (current goals can now be found in the care plan section) Progress towards PT goals: Progressing toward goals    Frequency  7X/week    PT Plan      Co-evaluation             End of Session Equipment Utilized During Treatment: Gait belt;Left knee immobilizer Activity Tolerance: Patient tolerated treatment well Patient left: in chair;with call bell/phone within reach;with family/visitor present     Time: 1345-1400 PT Time Calculation (min) (ACUTE ONLY): 15 min  Charges:   $Therapeutic Exercise: 8-22 mins  G Codes:      [name] 09/03/14, 3:51 PM

## 2014-08-10 NOTE — Progress Notes (Signed)
Physical Therapy Treatment Patient Details Name: [name] MRN: [id number] DOB: 08/13/1945 Today's Date: 08/10/2014    History of Present Illness L TKR    PT Comments    POD # 4 progressing slowly due to dizziness and hypotension.    Orthostatic vitals taken: Supine............Marland Kitchenblood pressure 124/67(81), HR 84, RA 100% EOB...............Marland Kitchenblood pressure 120/73(85), HR 103, RA 99% Standing.........Marland Kitchenblood pressure 110/82(86), HR 107, RA 99% amb 20 '  .......Marland Kitchenblood pressure  108/74(81), HR 110, RA 99%  Pt feeling better.  Mild c/o feeling dizzy(meds). Had spouse assist pt OOB and amb in hallway with instructions on safe handling.  Had spouse assist pt up/down 2 steps twice.  Given handout TKR TE's HEP and instructed on proper tech and freq.  Instructed on use of ICE and L LE extension.   Pt performed well enough to D/C to home with initial family support.    Follow Up Recommendations  Home health PT     Equipment Recommendations  None recommended by PT    Recommendations for Other Services       Precautions / Restrictions Precautions Precautions: Knee;Fall Precaution Comments: instructed pt and family on KI use for amb and stairs Knee Immobilizer - Left: Discontinue once straight leg raise with < 10 degree lag Restrictions Weight Bearing Restrictions: No LLE Weight Bearing: Weight bearing as tolerated Other Position/Activity Restrictions: WBAT    Mobility  Bed Mobility Overal bed mobility: Needs Assistance Bed Mobility: Supine to Sit     Supine to sit: Min assist     General bed mobility comments: cues for sequence; assist for RLE  Transfers Overall transfer level: Needs assistance Equipment used: Rolling walker (2 wheeled) Transfers: Sit to/from Stand Sit to Stand: Supervision;Min guard         General transfer comment: instructed spouse on safe handling tech  Ambulation/Gait Ambulation/Gait assistance: Supervision;Min guard Ambulation Distance  (Feet): 45 Feet Assistive device: Rolling walker (2 wheeled) Gait Pattern/deviations: Step-to pattern;Decreased stance time - left Gait velocity: decreased   General Gait Details: cues for sequence, posture and position from RW - l   Stairs Stairs: Yes Stairs assistance: Min assist Stair Management: No rails;Backwards;With walker;Step to pattern Number of Stairs: 2 (performed twice with spouse) General stair comments: 25% VC's on proper tech, sequencing and safety with pt and spouse.   Wheelchair Mobility    Modified Rankin (Stroke Patients Only)       Balance                                    Cognition Arousal/Alertness: Awake/alert Behavior During Therapy: WFL for tasks assessed/performed Overall Cognitive Status: Within Functional Limits for tasks assessed                      Exercises   Total Knee Replacement TE's 10 reps B LE ankle pumps 10 reps towel squeezes 10 reps knee presses 10 reps heel slides  10 reps SAQ's 10 reps SLR's 10 reps ABD Followed by ICE     General Comments        Pertinent Vitals/Pain Pain Assessment: 0-10 Pain Score: 8  Pain Location: L knee Pain Descriptors / Indicators: Aching;Tightness;Sore Pain Intervention(s): Monitored during session;Premedicated before session;Repositioned;Ice applied    Home Living                      Prior Function  PT Goals (current goals can now be found in the care plan section) Progress towards PT goals: Progressing toward goals    Frequency  7X/week    PT Plan      Co-evaluation             End of Session Equipment Utilized During Treatment: Gait belt;Left knee immobilizer Activity Tolerance: Patient tolerated treatment well Patient left: in chair;with call bell/phone within reach;with family/visitor present     Time: 8757-9728 PT Time Calculation (min) (ACUTE ONLY): 45 min  Charges:  $Gait Training: 8-22 mins $Therapeutic  Exercise: 8-22 mins $Therapeutic Activity: 8-22 mins                    G Codes:      Rica Koyanagi  PTA [facility]  Acute  Rehab Pager      [phone]

## 2014-08-10 NOTE — Progress Notes (Signed)
[place] for warfarin Indication: atrial fibrillation, VTE prophylaxis  Allergies  Allergen Reactions  . Sulfa Drugs Cross Reactors Other (See Comments)  . Tetracyclines & Related Other (See Comments)  . Contrast Media [Iodinated Diagnostic Agents] Rash   Patient Measurements: Height: 6' (182.9 cm) Weight: 225 lb (102.059 kg) IBW/kg (Calculated) : 77.6  Labs:  Recent Labs  08/08/14 0430 08/09/14 0438 08/10/14 0420  HGB 11.5* 10.7* 10.0*  HCT 33.5* 31.0* 29.7*  PLT 118* 123* 127*  LABPROT 16.6* 17.1* 17.4*  INR 1.33 1.37 1.41  CREATININE  --   --  0.99    Estimated Creatinine Clearance: 85.8 mL/min (by C-G formula based on Cr of 0.99).  Medications:  Scheduled:  . acidophilus  1 capsule Oral Daily  . allopurinol  300 mg Oral Daily  . atenolol  25 mg Oral Daily  . docusate sodium  100 mg Oral BID  . enoxaparin (LOVENOX) injection  30 mg Subcutaneous Q12H  . insulin aspart  0-15 Units Subcutaneous TID WC  . multivitamin  1 tablet Oral Daily  . warfarin  4 mg Oral ONCE-1800  . Warfarin - Pharmacist Dosing Inpatient   Does not apply q1800   Assessment: 69 y/o M on chronic warfarin for atrial fibrillation, underwent L TKA on 08/06/14.  Warfarin had been interrupted for surgery (last taken 3/18).  Orders received to resume warfarin postoperatively with pharmacy dosing assistance, and to begin Lovenox 30 mg SQ q12h starting the morning of POD#1.  Patient's most recent warfarin dosage was reportedly 4 mg daily except 6 mg on Wednesdays and Fridays.  Today, 08/10/2014::  INR rising slowly after four inpatient doses of warfarin (see MAR for dosages)  Hgb down slightly consistent with post-op blood loss and hydration  Pltc slightly low, but improving and >100K  No bleeding reported in chart note  Diet: carb modified, eating 75-100%  Drug interactions: allopurinol can increase INR response to warfarin, but patient on this  PTA.   Goal of Therapy:  INR 2-3 Monitor platelets by anticoagulation protocol: Yes   Plan: 1  Warfarin 4 mg PO x 1 today at 1800 as per home regimen. 2. PT/INR daily while inpatient. 3. Lovenox 30 mg SQ q12h as ordered by ortho until INR response sufficient  Clayburn Pert, PharmD, BCPS Pager: [phone] 08/10/2014  12:05 PM

## 2014-08-10 NOTE — Progress Notes (Signed)
Occupational Therapy Treatment Patient Details Name: [name] MRN: [id number] DOB: September 03, 1945 Today's Date: 08/10/2014    History of present illness L TKR   OT comments  Pt worked on dressing task and educated wife on how to assist. After pt donned short and underwear and stood to pull them up (KI in place) pt started to feel lightheaded. BP sitting 130/87. Pt felt fatigued after task and encouraged pt to take plenty of rest breaks at home. Pt then rested and practiced transfer to [facility] after rest break. He started to feel dizzy again and sat on BSC. BP 100/77. Nursing made aware and called to room. Assisted pt back to recliner. Nursing states she will inform MD.    Follow Up Recommendations  No OT follow up;Supervision/Assistance - 24 hour    Equipment Recommendations  None recommended by OT    Recommendations for Other Services      Precautions / Restrictions Precautions Precautions: Knee;Fall Precaution Comments: monitor BP Required Braces or Orthoses: Knee Immobilizer - Left Knee Immobilizer - Left: Discontinue once straight leg raise with < 10 degree lag Restrictions Weight Bearing Restrictions: No LLE Weight Bearing: Weight bearing as tolerated Other Position/Activity Restrictions: WBAT       Mobility Bed Mobility           Transfers Overall transfer level: Needs assistance Equipment used: Rolling walker (2 wheeled) Transfers: Sit to/from Stand Sit to Stand: Min guard;Min assist         General transfer comment: verbal cues hand placement. Min assist when becoming lightheaded. Educated wife on how to assist.     Balance                                   ADL                                         General ADL Comments: Pt and wife educated on how to perform LB dressing tasks. Wife assisted hands on with donning KI and LB clothing including underwear and shorts. Instructed them on importance of KI and when to wear. Wife  practiced with assisting pt to standing and managing clothing in standing. Instructed her on standing on the weaker side and where to provide steady support. Pt with some lightheadedness in standing after pulling up shorts. BP sitting 130/87. Sat and rested several minutes and then pivoted to [facility]. Pt started to feel lightheaded again . BP on BSC 100/77. Informed nursing. Wife concerned about BP and dizziness. Educated on sponge bathing initially and how to adjust 3in1 to appropriate height. Wife needs min cues and min assist for properly assisting pt but verbalizes understanding of all education once completed.      Vision                     Perception     Praxis      Cognition   Behavior During Therapy: Anxious Overall Cognitive Status: Within Functional Limits for tasks assessed                       Extremity/Trunk Assessment               Exercises     Shoulder Instructions       General Comments  Pertinent Vitals/ Pain       Pain Assessment: 0-10 Pain Score: 8  Pain Location: L knee Pain Descriptors / Indicators: Aching;Tightness;Sore Pain Intervention(s): Monitored during session;Premedicated before session;Repositioned;Ice applied  Home Living                                          Prior Functioning/Environment              Frequency Min 2X/week     Progress Toward Goals  OT Goals(current goals can now be found in the care plan section)  Progress towards OT goals: Progressing toward goals     Plan Discharge plan remains appropriate    Co-evaluation                 End of Session Equipment Utilized During Treatment: Gait belt;Rolling walker;Left knee immobilizer   Activity Tolerance Patient limited by fatigue;Other (comment) (lightheadedness)   Patient Left in chair;with call bell/phone within reach;with family/visitor present   Nurse Communication          Time: 3557-3220 OT Time  Calculation (min): 52 min  Charges: OT General Charges $OT Visit: 1 Procedure OT Treatments $Self Care/Home Management : 23-37 mins $Therapeutic Activity: 8-22 mins  Jules Schick  [phone] 08/10/2014, 1:09 PM

## 2014-08-10 NOTE — Progress Notes (Signed)
Discharge instructions given to patient and wife.  Instructed on how and when to give lovenox per dr Jen Mow orders. Bethann Punches RN

## 2014-08-10 NOTE — Progress Notes (Signed)
Subjective: 4 Days Post-Op Procedure(s) (LRB): LEFT TOTAL KNEE ARTHROPLASTY (Left) Patient reports pain as 3       Objective:   Vital signs in last 24 hours: Temp:  [98.2 F (36.8 C)-98.9 F (37.2 C)] 98.9 F (37.2 C) (03/28 0420) Pulse Rate:  [79-99] 99 (03/28 0420) Resp:  [16-20] 16 (03/28 0420) BP: (83-132)/(54-72) 126/71 mmHg (03/28 0420) SpO2:  [97 %-100 %] 98 % (03/28 0420)  Intake/Output from previous day: 03/27 0701 - 03/28 0700 In: 1245 [P.O.:720; IV Piggyback:525] Out: 2025 [Urine:2025] Intake/Output this shift:     Recent Labs  08/08/14 0430 08/09/14 0438 08/10/14 0420  HGB 11.5* 10.7* 10.0*    Recent Labs  08/09/14 0438 08/10/14 0420  WBC 6.8 5.4  RBC 3.43* 3.27*  HCT 31.0* 29.7*  PLT 123* 127*    Recent Labs  08/10/14 0420  NA 132*  K 3.7  CL 95*  CO2 29  BUN 20  CREATININE 0.99  GLUCOSE 122*  CALCIUM 8.1*    Recent Labs  08/09/14 0438 08/10/14 0420  INR 1.37 1.41    Neurologically intact Sensation intact distally Intact pulses distally Dorsiflexion/Plantar flexion intact Compartment soft  Assessment/Plan: 4 Days Post-Op Procedure(s) (LRB): LEFT TOTAL KNEE ARTHROPLASTY (Left) Advance diet D/C IV fluids Discharge home with home health  Patient on coumadin at home for afib. Per pharmacy. No episodes here of afib. Was off coumadin for 5 days prior to surgery. Ok for discharge if f/u with primary to monitor inr etc. Discussed with patient  Cordai Rodrigue C 08/10/2014, 7:11 AM

## 2014-08-11 NOTE — Discharge Summary (Signed)
Physician Discharge Summary   Patient ID: [name] MRN: [id number] DOB/AGE: 07-20-1945 69 y.o.  Admit date: 08/06/2014 Discharge date: 08/10/2014  Primary Diagnosis: Left knee primary osteoarthritis  Admission Diagnoses:  Past Medical History  Diagnosis Date  . Palpitations   . Diabetes mellitus   . Hypercholesteremia   . Hypertension   . Depression   . Hx of echocardiogram     Echo 3/16:  Mild LVH, EF 60-65%, mild MR, severe LAE, mild RAE, PASP 32 mmHg  . Prostate cancer     seed implantion- 15 to 20 yrs ago  . Arrhythmia     hx. Atrial Fib. ,"with regurgitation"  . Heart murmur   . Sleep apnea     uses CPAP-settings 5  . Ureter, stricture     hx. 2 yrs ago-stent has been removed now   Discharge Diagnoses:   Principal Problem:   Primary osteoarthritis of left knee Active Problems:   Left knee DJD  Estimated body mass index is 30.51 kg/(m^2) as calculated from the following:   Height as of this encounter: 6' (1.829 m).   Weight as of this encounter: 102.059 kg (225 lb).  Procedure:  Procedure(s) (LRB): LEFT TOTAL KNEE ARTHROPLASTY (Left)   Consults: None  HPI: see H&P Laboratory Data: Admission on 08/06/2014, Discharged on 08/10/2014  Component Date Value Ref Range Status  . Prothrombin Time 08/06/2014 15.5* 11.6 - 15.2 seconds Final  . INR 08/06/2014 1.22  0.00 - 1.49 Final  . Glucose-Capillary 08/06/2014 98  70 - 99 mg/dL Final  . Glucose-Capillary 08/06/2014 97  70 - 99 mg/dL Final  . WBC 08/06/2014 4.9  4.0 - 10.5 K/uL Final  . RBC 08/06/2014 4.25  4.22 - 5.81 MIL/uL Final  . Hemoglobin 08/06/2014 13.0  13.0 - 17.0 g/dL Final  . HCT 08/06/2014 39.9  39.0 - 52.0 % Final  . MCV 08/06/2014 93.9  78.0 - 100.0 fL Final  . MCH 08/06/2014 30.6  26.0 - 34.0 pg Final  . MCHC 08/06/2014 32.6  30.0 - 36.0 g/dL Final  . RDW 08/06/2014 13.3  11.5 - 15.5 % Final  . Platelets 08/06/2014 118* 150 - 400 K/uL Final   Comment: REPEATED TO VERIFY CONSISTENT WITH  PREVIOUS RESULT   . Creatinine, Ser 08/06/2014 1.14  0.50 - 1.35 mg/dL Final  . GFR calc non Af Amer 08/06/2014 63* >90 mL/min Final  . GFR calc Af Amer 08/06/2014 73* >90 mL/min Final   Comment: (NOTE) The eGFR has been calculated using the CKD EPI equation. This calculation has not been validated in all clinical situations. eGFR's persistently <90 mL/min signify possible Chronic Kidney Disease.   . WBC 08/07/2014 6.7  4.0 - 10.5 K/uL Final  . RBC 08/07/2014 3.85* 4.22 - 5.81 MIL/uL Final  . Hemoglobin 08/07/2014 12.0* 13.0 - 17.0 g/dL Final  . HCT 08/07/2014 35.7* 39.0 - 52.0 % Final  . MCV 08/07/2014 92.7  78.0 - 100.0 fL Final  . MCH 08/07/2014 31.2  26.0 - 34.0 pg Final  . MCHC 08/07/2014 33.6  30.0 - 36.0 g/dL Final  . RDW 08/07/2014 13.2  11.5 - 15.5 % Final  . Platelets 08/07/2014 120* 150 - 400 K/uL Final  . Sodium 08/07/2014 133* 135 - 145 mmol/L Final  . Potassium 08/07/2014 3.9  3.5 - 5.1 mmol/L Final  . Chloride 08/07/2014 98  96 - 112 mmol/L Final  . CO2 08/07/2014 27  19 - 32 mmol/L Final  . Glucose, Bld 08/07/2014 132*  70 - 99 mg/dL Final  . BUN 08/07/2014 17  6 - 23 mg/dL Final  . Creatinine, Ser 08/07/2014 1.13  0.50 - 1.35 mg/dL Final  . Calcium 08/07/2014 8.2* 8.4 - 10.5 mg/dL Final  . GFR calc non Af Amer 08/07/2014 64* >90 mL/min Final  . GFR calc Af Amer 08/07/2014 74* >90 mL/min Final   Comment: (NOTE) The eGFR has been calculated using the CKD EPI equation. This calculation has not been validated in all clinical situations. eGFR's persistently <90 mL/min signify possible Chronic Kidney Disease.   . Anion gap 08/07/2014 8  5 - 15 Final  . Prothrombin Time 08/07/2014 16.5* 11.6 - 15.2 seconds Final  . INR 08/07/2014 1.31  0.00 - 1.49 Final  . Glucose-Capillary 08/06/2014 91  70 - 99 mg/dL Final  . Comment 1 08/06/2014 Notify RN   Final  . Comment 2 08/06/2014 Document in Chart   Final  . Glucose-Capillary 08/06/2014 136* 70 - 99 mg/dL Final  . Comment  1 08/06/2014 Document in Chart   Final  . Comment 2 08/06/2014 Repeat Test   Final  . Glucose-Capillary 08/07/2014 141* 70 - 99 mg/dL Final  . Glucose-Capillary 08/07/2014 134* 70 - 99 mg/dL Final  . WBC 08/08/2014 8.1  4.0 - 10.5 K/uL Final  . RBC 08/08/2014 3.69* 4.22 - 5.81 MIL/uL Final  . Hemoglobin 08/08/2014 11.5* 13.0 - 17.0 g/dL Final  . HCT 08/08/2014 33.5* 39.0 - 52.0 % Final  . MCV 08/08/2014 90.8  78.0 - 100.0 fL Final  . MCH 08/08/2014 31.2  26.0 - 34.0 pg Final  . MCHC 08/08/2014 34.3  30.0 - 36.0 g/dL Final  . RDW 08/08/2014 13.0  11.5 - 15.5 % Final  . Platelets 08/08/2014 118* 150 - 400 K/uL Final   CONSISTENT WITH PREVIOUS RESULT  . Prothrombin Time 08/08/2014 16.6* 11.6 - 15.2 seconds Final  . INR 08/08/2014 1.33  0.00 - 1.49 Final  . Glucose-Capillary 08/07/2014 120* 70 - 99 mg/dL Final  . Glucose-Capillary 08/08/2014 115* 70 - 99 mg/dL Final  . Glucose-Capillary 08/07/2014 136* 70 - 99 mg/dL Final  . Glucose-Capillary 08/08/2014 116* 70 - 99 mg/dL Final  . WBC 08/09/2014 6.8  4.0 - 10.5 K/uL Final  . RBC 08/09/2014 3.43* 4.22 - 5.81 MIL/uL Final  . Hemoglobin 08/09/2014 10.7* 13.0 - 17.0 g/dL Final  . HCT 08/09/2014 31.0* 39.0 - 52.0 % Final  . MCV 08/09/2014 90.4  78.0 - 100.0 fL Final  . MCH 08/09/2014 31.2  26.0 - 34.0 pg Final  . MCHC 08/09/2014 34.5  30.0 - 36.0 g/dL Final  . RDW 08/09/2014 13.1  11.5 - 15.5 % Final  . Platelets 08/09/2014 123* 150 - 400 K/uL Final  . Prothrombin Time 08/09/2014 17.1* 11.6 - 15.2 seconds Final  . INR 08/09/2014 1.37  0.00 - 1.49 Final  . Glucose-Capillary 08/08/2014 107* 70 - 99 mg/dL Final  . Glucose-Capillary 08/08/2014 113* 70 - 99 mg/dL Final  . Comment 1 08/08/2014 Notify RN   Final  . Comment 2 08/08/2014 Document in Chart   Final  . Glucose-Capillary 08/09/2014 91  70 - 99 mg/dL Final  . Glucose-Capillary 08/09/2014 100* 70 - 99 mg/dL Final  . Prothrombin Time 08/10/2014 17.4* 11.6 - 15.2 seconds Final  . INR  08/10/2014 1.41  0.00 - 1.49 Final  . WBC 08/10/2014 5.4  4.0 - 10.5 K/uL Final  . RBC 08/10/2014 3.27* 4.22 - 5.81 MIL/uL Final  . Hemoglobin 08/10/2014 10.0* 13.0 -  17.0 g/dL Final  . HCT 08/10/2014 29.7* 39.0 - 52.0 % Final  . MCV 08/10/2014 90.8  78.0 - 100.0 fL Final  . MCH 08/10/2014 30.6  26.0 - 34.0 pg Final  . MCHC 08/10/2014 33.7  30.0 - 36.0 g/dL Final  . RDW 08/10/2014 13.0  11.5 - 15.5 % Final  . Platelets 08/10/2014 127* 150 - 400 K/uL Final  . Sodium 08/10/2014 132* 135 - 145 mmol/L Final  . Potassium 08/10/2014 3.7  3.5 - 5.1 mmol/L Final  . Chloride 08/10/2014 95* 96 - 112 mmol/L Final  . CO2 08/10/2014 29  19 - 32 mmol/L Final  . Glucose, Bld 08/10/2014 122* 70 - 99 mg/dL Final  . BUN 08/10/2014 20  6 - 23 mg/dL Final  . Creatinine, Ser 08/10/2014 0.99  0.50 - 1.35 mg/dL Final  . Calcium 08/10/2014 8.1* 8.4 - 10.5 mg/dL Final  . GFR calc non Af Amer 08/10/2014 81* >90 mL/min Final  . GFR calc Af Amer 08/10/2014 >90  >90 mL/min Final   Comment: (NOTE) The eGFR has been calculated using the CKD EPI equation. This calculation has not been validated in all clinical situations. eGFR's persistently <90 mL/min signify possible Chronic Kidney Disease.   . Anion gap 08/10/2014 8  5 - 15 Final  . Glucose-Capillary 08/09/2014 108* 70 - 99 mg/dL Final  . Glucose-Capillary 08/09/2014 107* 70 - 99 mg/dL Final  . Glucose-Capillary 08/10/2014 103* 70 - 99 mg/dL Final  . Glucose-Capillary 08/10/2014 85  70 - 99 mg/dL Final  Hospital Outpatient Visit on 07/29/2014  Component Date Value Ref Range Status  . Sodium 07/29/2014 141  135 - 145 mmol/L Final  . Potassium 07/29/2014 3.9  3.5 - 5.1 mmol/L Final  . Chloride 07/29/2014 104  96 - 112 mmol/L Final  . CO2 07/29/2014 28  19 - 32 mmol/L Final  . Glucose, Bld 07/29/2014 83  70 - 99 mg/dL Final  . BUN 07/29/2014 20  6 - 23 mg/dL Final  . Creatinine, Ser 07/29/2014 1.11  0.50 - 1.35 mg/dL Final  . Calcium 07/29/2014 9.1  8.4 -  10.5 mg/dL Final  . GFR calc non Af Amer 07/29/2014 65* >90 mL/min Final  . GFR calc Af Amer 07/29/2014 76* >90 mL/min Final   Comment: (NOTE) The eGFR has been calculated using the CKD EPI equation. This calculation has not been validated in all clinical situations. eGFR's persistently <90 mL/min signify possible Chronic Kidney Disease.   . Anion gap 07/29/2014 9  5 - 15 Final  . WBC 07/29/2014 5.2  4.0 - 10.5 K/uL Final  . RBC 07/29/2014 4.60  4.22 - 5.81 MIL/uL Final  . Hemoglobin 07/29/2014 14.3  13.0 - 17.0 g/dL Final  . HCT 07/29/2014 42.7  39.0 - 52.0 % Final  . MCV 07/29/2014 92.8  78.0 - 100.0 fL Final  . MCH 07/29/2014 31.1  26.0 - 34.0 pg Final  . MCHC 07/29/2014 33.5  30.0 - 36.0 g/dL Final  . RDW 07/29/2014 13.2  11.5 - 15.5 % Final  . Platelets 07/29/2014 148* 150 - 400 K/uL Final  . Prothrombin Time 07/29/2014 22.9* 11.6 - 15.2 seconds Final  . INR 07/29/2014 2.01* 0.00 - 1.49 Final  . ABO/RH(D) 07/29/2014 AB POS   Final  . Antibody Screen 07/29/2014 NEG   Final  . Sample Expiration 07/29/2014 08/09/2014   Final  . Color, Urine 07/29/2014 YELLOW  YELLOW Final  . APPearance 07/29/2014 CLEAR  CLEAR Final  . Specific  Gravity, Urine 07/29/2014 1.023  1.005 - 1.030 Final  . pH 07/29/2014 5.5  5.0 - 8.0 Final  . Glucose, UA 07/29/2014 NEGATIVE  NEGATIVE mg/dL Final  . Hgb urine dipstick 07/29/2014 NEGATIVE  NEGATIVE Final  . Bilirubin Urine 07/29/2014 NEGATIVE  NEGATIVE Final  . Ketones, ur 07/29/2014 NEGATIVE  NEGATIVE mg/dL Final  . Protein, ur 07/29/2014 NEGATIVE  NEGATIVE mg/dL Final  . Urobilinogen, UA 07/29/2014 1.0  0.0 - 1.0 mg/dL Final  . Nitrite 07/29/2014 NEGATIVE  NEGATIVE Final  . Leukocytes, UA 07/29/2014 NEGATIVE  NEGATIVE Final   MICROSCOPIC NOT DONE ON URINES WITH NEGATIVE PROTEIN, BLOOD, LEUKOCYTES, NITRITE, OR GLUCOSE <1000 mg/dL.  . ABO/RH(D) 07/29/2014 AB POS   Final  . MRSA, PCR 07/29/2014 NEGATIVE  NEGATIVE Final  . Staphylococcus aureus  07/29/2014 POSITIVE* NEGATIVE Final   Comment:        The Xpert SA Assay (FDA approved for NASAL specimens in patients over 23 years of age), is one component of a comprehensive surveillance program.  Test performance has been validated by [facility] for patients greater than or equal to 110 year old. It is not intended to diagnose infection nor to guide or monitor treatment.      X-Rays:Dg Knee 1-2 Views Left  07/29/2014   CLINICAL DATA:  Preop left total knee replacement.  EXAM: LEFT KNEE - 1-2 VIEW  COMPARISON:  None.  FINDINGS: There is joint space narrowing, most pronounced in the medial compartment. Small to moderate joint effusion. No acute bony abnormality. Specifically, no fracture, subluxation, or dislocation. Soft tissues are intact.  IMPRESSION: Joint space loss, mainly in the medial compartment. Small to moderate joint effusion.   Electronically Signed   By: Rolm Baptise M.D.   On: 07/29/2014 15:31   Dg Knee Left Port  08/06/2014   CLINICAL DATA:  69 year old male status post left knee surgery. Initial encounter.  EXAM: PORTABLE LEFT KNEE - 1-2 VIEW  COMPARISON:  07/29/2014.  FINDINGS: Portable AP and cross-table lateral views of the left knee. Sequelae of total knee arthroplasty. Postoperative changes to the surrounding soft tissues, including gas in the joint space. Hardware appears intact and normally aligned. No unexpected osseous changes identified.  IMPRESSION: Left total knee arthroplasty with no adverse features.   Electronically Signed   By: Genevie Ann M.D.   On: 08/06/2014 13:38    EKG: Orders placed or performed in visit on 07/20/14  . EKG 12-Lead     Hospital Course: [name] is a 70 y.o. who was admitted to [facility] They were brought to the operating room on 08/06/2014 and underwent Procedure(s): LEFT TOTAL KNEE ARTHROPLASTY.  Patient tolerated the procedure well and was later transferred to the recovery room and then to the orthopaedic floor for  postoperative care.  They were given PO and IV analgesics for pain control following their surgery.  They were given 24 hours of postoperative antibiotics of  Anti-infectives    Start     Dose/Rate Route Frequency Ordered Stop   08/06/14 1700  ceFAZolin (ANCEF) IVPB 2 g/50 mL premix     2 g 100 mL/hr over 30 Minutes Intravenous Every 6 hours 08/06/14 1414 08/07/14 0452   08/06/14 1117  polymyxin B 500,000 Units, bacitracin 50,000 Units in sodium chloride irrigation 0.9 % 500 mL irrigation  Status:  Discontinued       As needed 08/06/14 1118 08/06/14 1237   08/06/14 0800  ceFAZolin (ANCEF) IVPB 2 g/50 mL premix  2 g 100 mL/hr over 30 Minutes Intravenous On call to O.R. 08/06/14 [id number] 08/06/14 1043     and started on DVT prophylaxis in the form of Lovenox, Coumadin, TED hose and SCDs.   PT and OT were ordered for total joint protocol.  Discharge planning consulted to help with postop disposition and equipment needs.  Patient had a good night on the evening of surgery.  They started to get up OOB with therapy on day one. Hemovac drain was pulled without difficulty.  Continued to work with therapy into day two.  By day four, the patient had progressed with therapy and meeting their goals.  Incision was healing well.  Patient was seen in rounds daily and was ready to go home on POD #4. He was instructed to follow up accordingly for anticoagulation mgmt for his coumadin.   Diet: Regular diet Activity:WBAT Follow-up:in 10-14 days Disposition - Home Discharged Condition: good   Discharge Instructions    Call MD / Call 911    Complete by:  As directed   If you experience chest pain or shortness of breath, CALL 911 and be transported to the hospital emergency room.  If you develope a fever above 101 F, pus (white drainage) or increased drainage or redness at the wound, or calf pain, call your surgeon's office.     Constipation Prevention    Complete by:  As directed   Drink plenty of fluids.   Prune juice may be helpful.  You may use a stool softener, such as Colace (over the counter) 100 mg twice a day.  Use MiraLax (over the counter) for constipation as needed.     Diet - low sodium heart healthy    Complete by:  As directed      Increase activity slowly as tolerated    Complete by:  As directed             Medication List    STOP taking these medications        aspirin 81 MG tablet     traMADol 50 MG tablet  Commonly known as:  ULTRAM      TAKE these medications        allopurinol 300 MG tablet  Commonly known as:  ZYLOPRIM  Take 300 mg by mouth daily.     atenolol 25 MG tablet  Commonly known as:  TENORMIN  Take 25 mg by mouth every morning.     atorvastatin 10 MG tablet  Commonly known as:  LIPITOR  Take 10 mg by mouth every evening.     docusate sodium 100 MG capsule  Commonly known as:  COLACE  Take 1 capsule (100 mg total) by mouth 2 (two) times daily as needed for mild constipation.     enoxaparin 40 MG/0.4ML injection  Commonly known as:  LOVENOX  Inject 0.4 mLs (40 mg total) into the skin daily.     HYDROcodone-acetaminophen 7.5-325 MG per tablet  Commonly known as:  NORCO  Take 1 tablet by mouth every 4 (four) hours as needed for severe pain.     metFORMIN 1000 MG tablet  Commonly known as:  GLUCOPHAGE  Take 500 mg by mouth daily with breakfast.     PRESERVISION AREDS 2 Caps  Take 1 capsule by mouth daily.     MULTIVITAMIN PO  Take 1 tablet by mouth daily.     warfarin 4 MG tablet  Commonly known as:  COUMADIN  Take 1-1.5 tablets by mouth See  admin instructions. 1 tab (98m) on Monday, Tues, Thurs, Sat, Sun and 1.5 tab (674m Wed, Fri           Follow-up Information    Follow up with BEANE,JEFFREY C, MD In 2 weeks.   Specialty:  Orthopedic Surgery   Why:  For suture removal   Contact information:   [id number]     [place] up with [facility]   Contact information:   [phone]       Signed: JaLacie DraftPA-C for Dr. BeTonita Congrthopaedic Surgery 08/11/2014, 12:05 PM

## 2015-01-12 ENCOUNTER — Telehealth: Payer: Self-pay | Admitting: Nurse Practitioner

## 2015-01-12 NOTE — Telephone Encounter (Signed)
Records rec via fax from [facility] for apt W/ Truitt Merle 02/04/15 placed in chart prep bin.

## 2015-02-04 ENCOUNTER — Encounter: Payer: Self-pay | Admitting: Nurse Practitioner

## 2015-02-04 ENCOUNTER — Ambulatory Visit (INDEPENDENT_AMBULATORY_CARE_PROVIDER_SITE_OTHER): Payer: Medicare Other | Admitting: Nurse Practitioner

## 2015-02-04 VITALS — BP 136/90 | HR 64 | Ht 72.0 in | Wt 222.0 lb

## 2015-02-04 DIAGNOSIS — I482 Chronic atrial fibrillation, unspecified: Secondary | ICD-10-CM

## 2015-02-04 DIAGNOSIS — R0789 Other chest pain: Secondary | ICD-10-CM | POA: Diagnosis not present

## 2015-02-04 MED ORDER — APIXABAN 5 MG PO TABS: 5.0000 mg | ORAL_TABLET | Freq: Two times a day (BID) | ORAL | Status: DC

## 2015-02-04 NOTE — Patient Instructions (Addendum)
We will be checking the following labs today - NONE   Medication Instructions:    Continue with your current medicines.     Testing/Procedures To Be Arranged:  Lexiscan Myoview  Follow-Up:   See me in 6 months    Other Special Instructions:   I will give you a paper RX for Eliquis to check on prices - do not start this without talking with [place] or [name] if you decide to switch.   Call the [place] office at [phone] if you have any questions, problems or concerns.

## 2015-02-04 NOTE — Progress Notes (Signed)
CARDIOLOGY OFFICE NOTE  Date:  02/04/2015    [name] Date of Birth: 07/21/1945 Medical Record #[id number]  PCP:  Donnajean Lopes, MD  Cardiologist:  Aundra Dubin    Chief Complaint  Patient presents with  . Atrial Fibrillation    6 month check - seen for [name]    History of Present Illness: [name] is a 69 y.o. male who presents today for a 6 month check. Seen for [name]. He is a former patient of Dr. Susa Simmonds. He has chronic atrial fibrillation that has recurred after multiple cardioversions. He is maintained on chronic Coumadin. He had a normal Lexi scan Myoview in 2013 EF 57%. 2-D echo in 2013 showed normal LV function with moderate MR.  He also has OSA on CPAP, hypertension, hyperlipidemia, obesity. CT of chest 05/11/14 showed stable pulmonary nodules the largest measuring 6 mm. Repeat recommended in one year.  Last seen by Margarite Gouge, PA back in March - felt to be doing well.  Comes back today. Here with his wife - [name] - I see her as well. He has done well. Coumadin managed by PCP. He is interested in pricing for NOAC. He notes that for the past couple of months that he has had an exertional chest tightness - only with walking up one particularly hill - he would have to stop and rest - it would go away - then he could finish walking. He is no longer walking this route and has done ok. No other episodes. He has HTN, HLD and DM. Says sugar control is ok. Not dizzy or lightheaded. Tolerating his medicines. Discussed NOAC.   Past Medical History  Diagnosis Date  . Palpitations   . Diabetes mellitus   . Hypercholesteremia   . Hypertension   . Depression   . Hx of echocardiogram     Echo 3/16:  Mild LVH, EF 60-65%, mild MR, severe LAE, mild RAE, PASP 32 mmHg  . Prostate cancer     seed implantion- 15 to 20 yrs ago  . Arrhythmia     hx. Atrial Fib. ,"with regurgitation"  . Heart murmur   . Sleep apnea     uses CPAP-settings 5  . Ureter, stricture    hx. 2 yrs ago-stent has been removed now  . Osteoarthritis (arthritis due to wear and tear of joints)   . A-fib     Past Surgical History  Procedure Laterality Date  . Seed implants    . Cystoscopy w/ retrogrades  04/04/2012    Procedure: CYSTOSCOPY WITH RETROGRADE PYELOGRAM;  Surgeon: Molli Hazard, MD;  Location: [facility];  Service: Urology;  Laterality: Bilateral;  . Cystoscopy with retrograde pyelogram, ureteroscopy and stent placement  04/04/2012    Procedure: CYSTOSCOPY WITH RETROGRADE PYELOGRAM, URETEROSCOPY AND STENT PLACEMENT;  Surgeon: Molli Hazard, MD;  Location: [facility];  Service: Urology;  Laterality: Right;  . Total knee arthroplasty Left 08/06/2014    Procedure: LEFT TOTAL KNEE ARTHROPLASTY;  Surgeon: Susa Day, MD;  Location: [facility];  Service: Orthopedics;  Laterality: Left;     Medications: Current Outpatient Prescriptions  Medication Sig Dispense Refill  . allopurinol (ZYLOPRIM) 300 MG tablet Take 300 mg by mouth daily.    Marland Kitchen atenolol-chlorthalidone (TENORETIC) 50-25 MG per tablet Take 1 tablet by mouth daily.    Marland Kitchen atorvastatin (LIPITOR) 10 MG tablet Take 10 mg by mouth every evening.     Marland Kitchen HYDROcodone-acetaminophen (NORCO) 7.5-325 MG per tablet Take 1  tablet by mouth every 4 (four) hours as needed for severe pain. 60 tablet 0  . metFORMIN (GLUCOPHAGE) 1000 MG tablet Take 500 mg by mouth daily with breakfast.    . Multiple Vitamin (MULTIVITAMIN PO) Take 1 tablet by mouth daily.     . Multiple Vitamins-Minerals (PRESERVISION AREDS 2) CAPS Take 1 capsule by mouth daily.    . traMADol (ULTRAM) 50 MG tablet Take 50 mg by mouth daily. arthritis    . warfarin (COUMADIN) 4 MG tablet Take 1-1.5 tablets by mouth See admin instructions. 1 tab (4mg ) on Monday, Tues, Thurs, Sat, Sun and 1.5 tab (6mg ) Wed, Fri     No current facility-administered medications for this visit.    Allergies: Allergies  Allergen Reactions  . Sulfa Drugs Cross Reactors Other (See  Comments)  . Tetracyclines & Related Other (See Comments)  . Contrast Media [Iodinated Diagnostic Agents] Rash    Social History: The patient  reports that he quit smoking about 41 years ago. He does not have any smokeless tobacco history on file. He reports that he drinks alcohol. He reports that he does not use illicit drugs.   Family History: The patient's family history includes Cancer in his mother; Dementia in his mother; Heart attack in his father and sister; Stroke in his sister.   Review of Systems: Please see the history of present illness.   Otherwise, the review of systems is positive for none.   All other systems are reviewed and negative.   Physical Exam: VS:  BP 136/90 mmHg  Pulse 64  Ht 6' (1.829 m)  Wt 222 lb (100.699 kg)  BMI 30.10 kg/m2 .  BMI Body mass index is 30.1 kg/(m^2).  Wt Readings from Last 3 Encounters:  02/04/15 222 lb (100.699 kg)  08/06/14 225 lb (102.059 kg)  07/29/14 225 lb (102.059 kg)    General: Pleasant. Well developed, well nourished and in no acute distress.  HEENT: Normal. Neck: Supple, no JVD, carotid bruits, or masses noted.  Cardiac: Irregular irregular rhythm. Rate is ok. No murmurs, rubs, or gallops. No edema.  Respiratory:  Lungs are clear to auscultation bilaterally with normal work of breathing.  GI: Soft and nontender.  MS: No deformity or atrophy. Gait and ROM intact. Skin: Warm and dry. Color is normal.  Neuro:  Strength and sensation are intact and no gross focal deficits noted.  Psych: Alert, appropriate and with normal affect.   LABORATORY DATA:  EKG:  EKG is ordered today. This demonstrates atrial fib with a controlled VR.  Lab Results  Component Value Date   WBC 5.4 08/10/2014   HGB 10.0* 08/10/2014   HCT 29.7* 08/10/2014   PLT 127* 08/10/2014   GLUCOSE 122* 08/10/2014   ALT 22 09/21/2013   AST 25 09/21/2013   NA 132* 08/10/2014   K 3.7 08/10/2014   CL 95* 08/10/2014   CREATININE 0.99 08/10/2014   BUN 20  08/10/2014   CO2 29 08/10/2014   INR 1.41 08/10/2014    BNP (last 3 results) No results for input(s): BNP in the last 8760 hours.  ProBNP (last 3 results) No results for input(s): PROBNP in the last 8760 hours.   Other Studies Reviewed Today:  Echo Study Conclusions from 07/2014  - Left ventricle: The cavity size was normal. Wall thickness was increased in a pattern of mild LVH. Systolic function was normal. The estimated ejection fraction was in the range of 60% to 65%. - Mitral valve: There was mild regurgitation. -  Left atrium: The atrium was severely dilated. - Right atrium: The atrium was mildly dilated. - Pulmonary arteries: PA peak pressure: 32 mm Hg (S).  CT of chest 05/11/14 IMPRESSION: Stable pulmonary nodules, with the largest measuring 6 mm. Another follow-up in 12 months will confirm benign etiology in this former cigarette smoker. This recommendation follows the consensus statement: Guidelines for Management of Small Pulmonary Nodules Detected on CT Scans: A Statement from the [employer] as published in Radiology 2005; 237:395-400.   Electronically Signed  By: Dereck Ligas M.D.  On: 05/11/2014 13:18   Myoview Impression from 09/2011 Exercise Capacity: Lexiscan with low level exercise. BP Response: Hypertensive blood pressure response. Clinical Symptoms: Lightheaded.  ECG Impression: Atrial fibrillation, no change with infusion.  Comparison with Prior Nuclear Study: No significant change from previous study  Overall Impression: Normal stress nuclear study.  LV Ejection Fraction: 57%. LV Wall Motion: NL LV Function; NL Wall Motion  [employer]   ASSESSMENT AND PLAN:  Chest pain - with exertion - no spells at rest or if he walks flat. Multiple CV risk factors and last Myoview over 3 years ago. EKG not acute. Will update his Myoview. Further disposition to follow.  Atrial fibrillation Patient has chronic atrial fibrillation  and Coumadin is managed by Dr. Sharlett Iles. His rate is controlled with Tenormin. We discussed NOAC and I gave him a paper RX to check on pricing for Eliquis - he understands that he is NOT to just start if he decides to take.   Exertional dyspnea Exertional dyspnea has not changed over the years. He does have history of pulmonary nodules with the largest measuring 6 mm and needs repeat in December  HLD - on statin  DM - followed by PCP  HTN - BP fair on current regimen.  Current medicines are reviewed with the patient today.  The patient does not have concerns regarding medicines other than what has been noted above.  The following changes have been made:  See above.  Labs/ tests ordered today include:    Orders Placed This Encounter  Procedures  . Myocardial Perfusion Imaging  . EKG 12-Lead     Disposition:   FU with me in 6 months unless his stress study is abnormal.   Patient is agreeable to this plan and will call if any problems develop in the interim.   Signed: Burtis Junes, RN, ANP-C 02/04/2015 10:57 AM  [facility] [address] [place], [place]  [address] Phone: [phone] Fax: [phone]

## 2015-02-05 ENCOUNTER — Telehealth: Payer: Self-pay | Admitting: Nurse Practitioner

## 2015-02-05 NOTE — Telephone Encounter (Signed)
Pt calling to give you the  three meds he takes that we did not have :tramadol hcl 50mg , colchicine .6mg , and indomethacin 25mg 

## 2015-02-08 ENCOUNTER — Other Ambulatory Visit: Payer: Self-pay | Admitting: *Deleted

## 2015-02-08 MED ORDER — INDOMETHACIN 25 MG PO CAPS: 25.0000 mg | ORAL_CAPSULE | Freq: Two times a day (BID) | ORAL | Status: DC

## 2015-02-08 MED ORDER — COLCHICINE 0.6 MG PO TABS: 0.6000 mg | ORAL_TABLET | Freq: Every day | ORAL | Status: DC

## 2015-02-17 ENCOUNTER — Telehealth (HOSPITAL_COMMUNITY): Payer: Self-pay | Admitting: *Deleted

## 2015-02-17 NOTE — Telephone Encounter (Signed)
Patient given detailed instructions per Myocardial Perfusion Study Information Sheet for test on 02/17/15 at 845 am. Patient notified to arrive 15 minutes early and that it is imperative to arrive on time for appointment to keep from having the test rescheduled.  If you need to cancel or reschedule your appointment, please call the office within 24 hours of your appointment. Failure to do so may result in a cancellation of your appointment, and a $50 no show fee. Patient verbalized understanding. Hubbard Robinson, RN

## 2015-02-19 ENCOUNTER — Ambulatory Visit (HOSPITAL_COMMUNITY): Payer: Medicare Other | Attending: Cardiovascular Disease

## 2015-02-19 DIAGNOSIS — R9439 Abnormal result of other cardiovascular function study: Secondary | ICD-10-CM | POA: Diagnosis not present

## 2015-02-19 DIAGNOSIS — I482 Chronic atrial fibrillation, unspecified: Secondary | ICD-10-CM

## 2015-02-19 DIAGNOSIS — I10 Essential (primary) hypertension: Secondary | ICD-10-CM | POA: Insufficient documentation

## 2015-02-19 DIAGNOSIS — R0609 Other forms of dyspnea: Secondary | ICD-10-CM | POA: Insufficient documentation

## 2015-02-19 DIAGNOSIS — R002 Palpitations: Secondary | ICD-10-CM | POA: Diagnosis not present

## 2015-02-19 DIAGNOSIS — R0789 Other chest pain: Secondary | ICD-10-CM | POA: Diagnosis present

## 2015-02-19 LAB — MYOCARDIAL PERFUSION IMAGING
RATE: 0.36
Rest HR: 59 {beats}/min
SDS: 1
SRS: 8
SSS: 7

## 2015-02-19 MED ORDER — TECHNETIUM TC 99M SESTAMIBI GENERIC - CARDIOLITE
10.3000 | Freq: Once | INTRAVENOUS | Status: AC | PRN
  Administered 2015-02-19: 10 via INTRAVENOUS

## 2015-02-19 MED ORDER — TECHNETIUM TC 99M SESTAMIBI GENERIC - CARDIOLITE
31.4000 | Freq: Once | INTRAVENOUS | Status: AC | PRN
  Administered 2015-02-19: 31 via INTRAVENOUS

## 2015-02-19 MED ORDER — REGADENOSON 0.4 MG/5ML IV SOLN
0.4000 mg | Freq: Once | INTRAVENOUS | Status: AC
  Administered 2015-02-19: 0.4 mg via INTRAVENOUS

## 2015-03-05 ENCOUNTER — Ambulatory Visit: Payer: Medicare Other | Admitting: Cardiology

## 2015-03-26 ENCOUNTER — Ambulatory Visit: Payer: Medicare Other | Admitting: Nurse Practitioner

## 2015-05-08 IMAGING — CR DG KNEE 1-2V PORT*L*
1 series · 2 of 2 positions shown · non-contrast
Comparison: 07/29/2014.

CLINICAL DATA: 68-year-old male status post left knee surgery.
Initial encounter.

EXAM:
PORTABLE LEFT KNEE - 1-2 VIEW

[Series 1: AP · left · 2 of 2 slices shown]
[im 1/2]
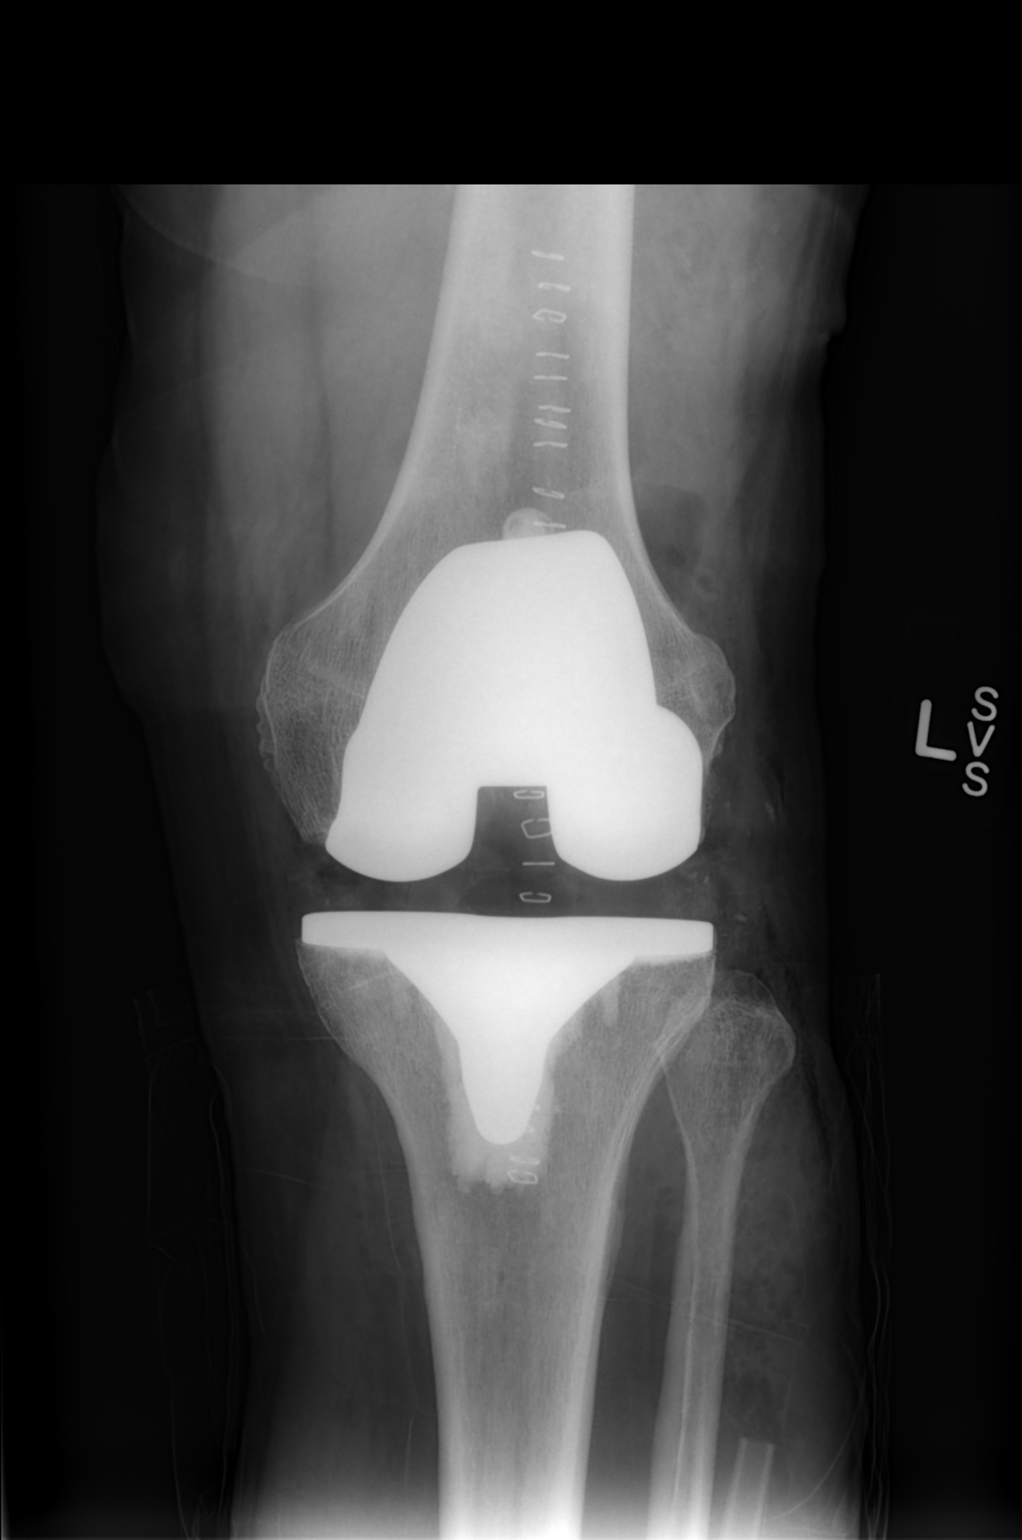
[im 2/2]
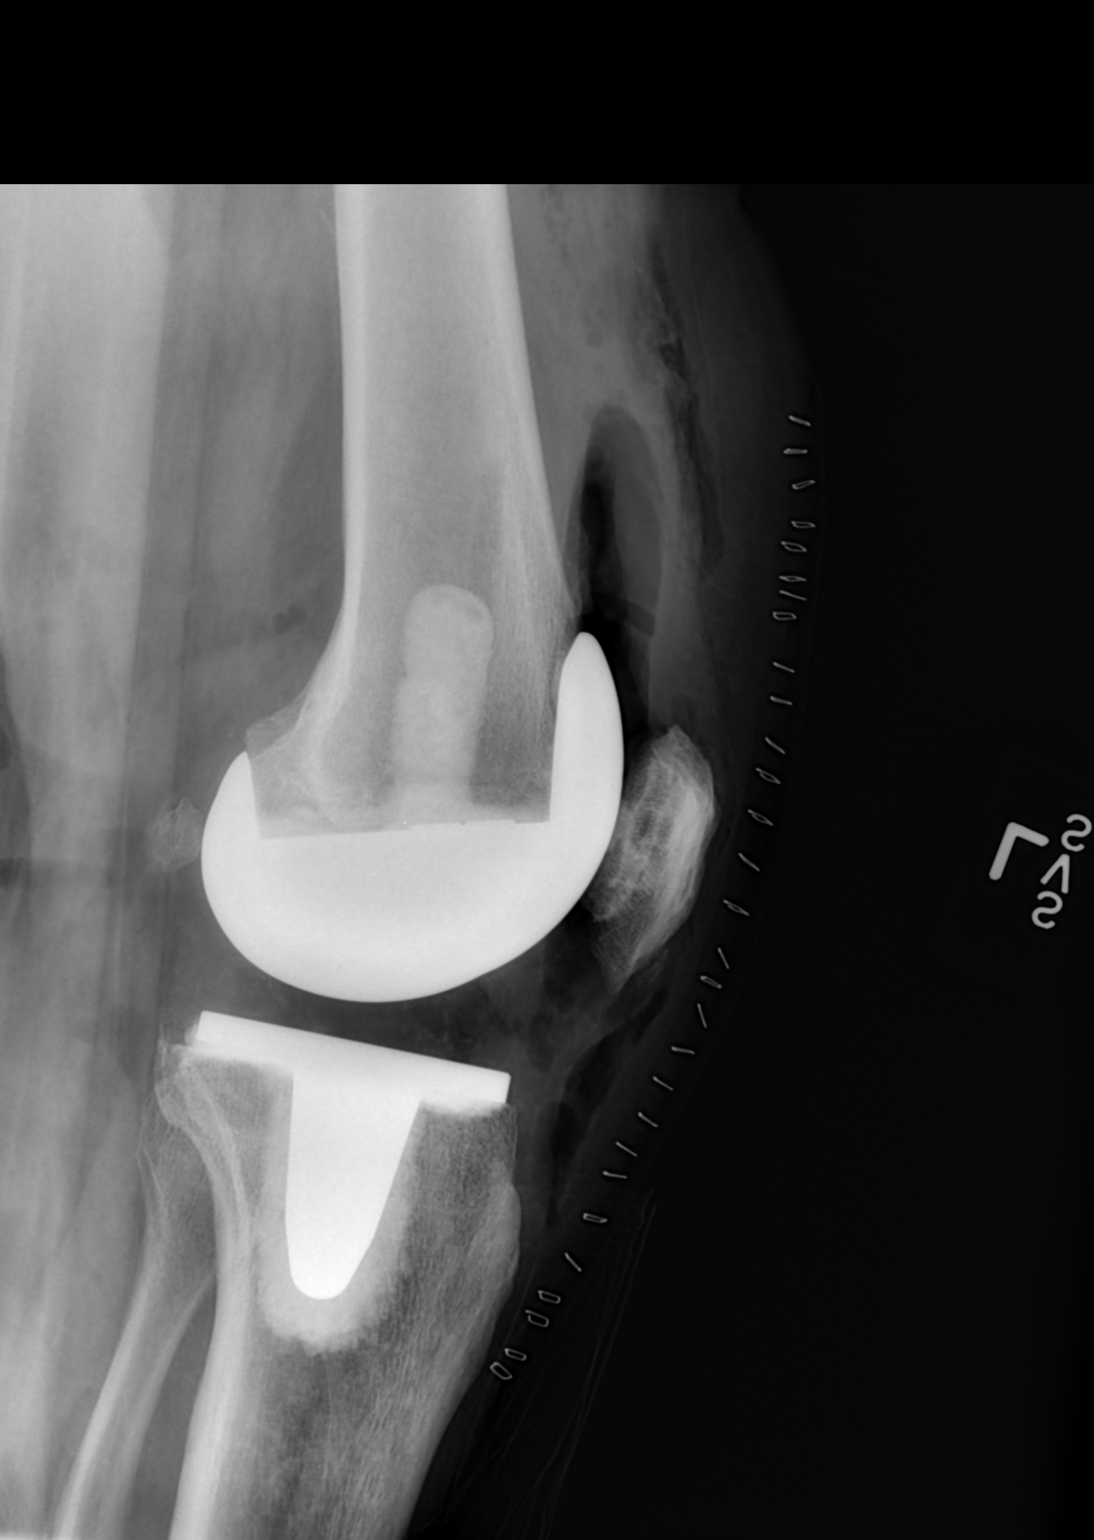

[2 of 2 positions shown; findings below may reference images not displayed]

FINDINGS: Portable AP and cross-table lateral views of the left knee. Sequelae
of total knee arthroplasty. Postoperative changes to the surrounding
soft tissues, including gas in the joint space. Hardware appears
intact and normally aligned. No unexpected osseous changes
identified.
IMPRESSION: Left total knee arthroplasty with no adverse features.

## 2015-08-04 ENCOUNTER — Ambulatory Visit (INDEPENDENT_AMBULATORY_CARE_PROVIDER_SITE_OTHER): Payer: Medicare Other | Admitting: Nurse Practitioner

## 2015-08-04 ENCOUNTER — Encounter: Payer: Self-pay | Admitting: Nurse Practitioner

## 2015-08-04 ENCOUNTER — Ambulatory Visit (INDEPENDENT_AMBULATORY_CARE_PROVIDER_SITE_OTHER)
Admission: RE | Admit: 2015-08-04 | Discharge: 2015-08-04 | Disposition: A | Discharge status: Home / Self Care | Payer: Medicare Other | Source: Ambulatory Visit | Attending: Nurse Practitioner | Admitting: Nurse Practitioner

## 2015-08-04 VITALS — BP 118/82 | HR 65 | Ht 72.0 in | Wt 224.8 lb

## 2015-08-04 DIAGNOSIS — I482 Chronic atrial fibrillation, unspecified: Secondary | ICD-10-CM

## 2015-08-04 DIAGNOSIS — Z7901 Long term (current) use of anticoagulants: Secondary | ICD-10-CM

## 2015-08-04 DIAGNOSIS — E785 Hyperlipidemia, unspecified: Secondary | ICD-10-CM | POA: Diagnosis not present

## 2015-08-04 DIAGNOSIS — R911 Solitary pulmonary nodule: Secondary | ICD-10-CM | POA: Diagnosis not present

## 2015-08-04 NOTE — Patient Instructions (Addendum)
We will be checking the following labs today - NONE   Medication Instructions:    Continue with your current medicines. BUT  Check on the pricing for the Eliquis and let me know - do not start without talking to our office.     Testing/Procedures To Be Arranged:  CT chest to follow up lung nodule  Follow-Up:   See me in 6 months.      Other Special Instructions:   N/A    If you need a refill on your cardiac medications before your next appointment, please call your pharmacy.   Call the [place] office at [phone] if you have any questions, problems or concerns.

## 2015-08-04 NOTE — Progress Notes (Signed)
CARDIOLOGY OFFICE NOTE  Date:  08/04/2015    [name] Date of Birth: 1945-07-05 Medical Record [id number]  PCP:  Donnajean Lopes, MD  Cardiologist:  Aundra Dubin    Chief Complaint  Patient presents with  . Atrial Fibrillation  . Shortness of Breath  . Hyperlipidemia    6 month check - seen for [name]    History of Present Illness: [name] is a 70 y.o. male who presents today for a 6 month check. Seen for [name]. He is a former patient of Dr. Susa Simmonds. He has chronic atrial fibrillation that has recurred after multiple cardioversions. He is maintained on chronic Coumadin. He had a low risk Myoview in September of 2016. He also has OSA on CPAP, hypertension, hyperlipidemia, obesity. CT of chest 05/11/14 showed stable pulmonary nodules the largest measuring 6 mm with recommended repeat study in one year.  Last seen by me back in September - had had some spells of chest pain and we got his Myoview updated. This turned out to be low risk.   Comes back today. Here with his wife - [name] - I see her as well. He is doing ok. No chest pain. Breathing is good. Tolerating his medicines. Coumadin may be a little labile - he has a paper script for Eliquis but has not heard from the pharmacy regarding pricing. Did not get a follow up CT scan for his lung nodule. He is happy with how he is doing. Saw PCP last month - ACE was stopped. BP is great. A1C running 4.8 to 5.2  Past Medical History  Diagnosis Date  . Palpitations   . Diabetes mellitus   . Hypercholesteremia   . Hypertension   . Depression   . Hx of echocardiogram     Echo 3/16:  Mild LVH, EF 60-65%, mild MR, severe LAE, mild RAE, PASP 32 mmHg  . Prostate cancer ([place])     seed implantion- 15 to 20 yrs ago  . Arrhythmia     hx. Atrial Fib. ,"with regurgitation"  . Heart murmur   . Sleep apnea     uses CPAP-settings 5  . Ureter, stricture     hx. 2 yrs ago-stent has been removed now  . Osteoarthritis (arthritis  due to wear and tear of joints)   . A-fib [facility])     Past Surgical History  Procedure Laterality Date  . Seed implants    . Cystoscopy w/ retrogrades  04/04/2012    Procedure: CYSTOSCOPY WITH RETROGRADE PYELOGRAM;  Surgeon: Molli Hazard, MD;  Location: [facility];  Service: Urology;  Laterality: Bilateral;  . Cystoscopy with retrograde pyelogram, ureteroscopy and stent placement  04/04/2012    Procedure: CYSTOSCOPY WITH RETROGRADE PYELOGRAM, URETEROSCOPY AND STENT PLACEMENT;  Surgeon: Molli Hazard, MD;  Location: [facility];  Service: Urology;  Laterality: Right;  . Total knee arthroplasty Left 08/06/2014    Procedure: LEFT TOTAL KNEE ARTHROPLASTY;  Surgeon: Susa Day, MD;  Location: [facility];  Service: Orthopedics;  Laterality: Left;     Medications: Current Outpatient Prescriptions  Medication Sig Dispense Refill  . allopurinol (ZYLOPRIM) 300 MG tablet Take 300 mg by mouth daily.    Marland Kitchen atenolol-chlorthalidone (TENORETIC) 50-25 MG per tablet Take 1 tablet by mouth daily.    Marland Kitchen atorvastatin (LIPITOR) 10 MG tablet Take 10 mg by mouth every evening.     . colchicine 0.6 MG tablet Take 0.6 mg by mouth as needed.    Marland Kitchen  metFORMIN (GLUCOPHAGE) 1000 MG tablet Take 500 mg by mouth daily with breakfast.    . Multiple Vitamin (MULTIVITAMIN PO) Take 1 tablet by mouth daily.     . Multiple Vitamins-Minerals (PRESERVISION AREDS 2) CAPS Take 1 capsule by mouth daily.    . traMADol (ULTRAM) 50 MG tablet Take 50 mg by mouth daily. arthritis    . warfarin (COUMADIN) 4 MG tablet Take 1-1.5 tablets by mouth See admin instructions. 1 tab (4mg ) on Monday, Tues, Thurs, Sat, Sun and 1.5 tab (6mg ) Wed, Fri     No current facility-administered medications for this visit.    Allergies: Allergies  Allergen Reactions  . Sulfa Drugs Cross Reactors Other (See Comments)  . Tetracyclines & Related Other (See Comments)  . Contrast Media [Iodinated Diagnostic Agents] Rash    Social History: The patient   reports that he quit smoking about 42 years ago. He does not have any smokeless tobacco history on file. He reports that he drinks alcohol. He reports that he does not use illicit drugs.   Family History: The patient's family history includes Cancer in his mother; Dementia in his mother; Heart attack in his father and sister; Stroke in his sister.   Review of Systems: Please see the history of present illness.   Otherwise, the review of systems is positive for none.   All other systems are reviewed and negative.   Physical Exam: VS:  BP 118/82 mmHg  Pulse 65  Ht 6' (1.829 m)  Wt 224 lb 12.8 oz (101.969 kg)  BMI 30.48 kg/m2  SpO2 97% .  BMI Body mass index is 30.48 kg/(m^2).  Wt Readings from Last 3 Encounters:  08/04/15 224 lb 12.8 oz (101.969 kg)  02/19/15 222 lb (100.699 kg)  02/04/15 222 lb (100.699 kg)    General: Pleasant. Well developed, well nourished and in no acute distress.  HEENT: Normal. Neck: Supple, no JVD, carotid bruits, or masses noted.  Cardiac: Irregular irregular rhythm. Rate is ok. No murmurs, rubs, or gallops. No edema.  Respiratory:  Lungs are clear to auscultation bilaterally with normal work of breathing.  GI: Soft and nontender.  MS: No deformity or atrophy. Gait and ROM intact. Skin: Warm and dry. Color is normal.  Neuro:  Strength and sensation are intact and no gross focal deficits noted.  Psych: Alert, appropriate and with normal affect.   LABORATORY DATA:  EKG:  EKG is not ordered today.  Lab Results  Component Value Date   WBC 5.4 08/10/2014   HGB 10.0* 08/10/2014   HCT 29.7* 08/10/2014   PLT 127* 08/10/2014   GLUCOSE 122* 08/10/2014   ALT 22 09/21/2013   AST 25 09/21/2013   NA 132* 08/10/2014   K 3.7 08/10/2014   CL 95* 08/10/2014   CREATININE 0.99 08/10/2014   BUN 20 08/10/2014   CO2 29 08/10/2014   INR 1.41 08/10/2014    BNP (last 3 results) No results for input(s): BNP in the last 8760 hours.  ProBNP (last 3 results) No  results for input(s): PROBNP in the last 8760 hours.   Other Studies Reviewed Today:  Echo Study Conclusions from 07/2014  - Left ventricle: The cavity size was normal. Wall thickness was increased in a pattern of mild LVH. Systolic function was normal. The estimated ejection fraction was in the range of 60% to 65%. - Mitral valve: There was mild regurgitation. - Left atrium: The atrium was severely dilated. - Right atrium: The atrium was mildly dilated. - Pulmonary  arteries: PA peak pressure: 32 mm Hg (S).  CT of chest 05/11/14 IMPRESSION: Stable pulmonary nodules, with the largest measuring 6 mm. Another follow-up in 12 months will confirm benign etiology in this former cigarette smoker. This recommendation follows the consensus statement: Guidelines for Management of Small Pulmonary Nodules Detected on CT Scans: A Statement from the [employer] as published in Radiology 2005; 237:395-400.   Electronically Signed  By: Dereck Ligas M.D.  On: 05/11/2014 13:18   Myoview Study Highlights from 02/2015     Defect 1: There is a small defect of mild severity present in the basal inferior and basal inferolateral location.  There was no ST segment deviation noted during stress.  Thinning in the inferior / inferolateral region (base) Otherwise normal perfusion. May represent soft tissue attenuation vs small region of scar Images not gated.  Overall low risk scan.      ASSESSMENT AND PLAN:  1. Multiple CV risk factors - low risk Myoview from October noted. Would continue with current regimen and CV risk factor modification. He has had no more chest pain.   2. Chronic Atrial fibrillation Patient has chronic atrial fibrillation and Coumadin is managed by Dr. Sharlett Iles. His rate is controlled with Tenormin. We discussed NOAC again today and he has a  paper RX at the pharmacy to check on pricing for Eliquis - he understands that he is NOT to just start if he  decides to take.   3. HLD - on statin  4. DM - followed by PCP  5. HTN - BP looks great on his current regimen. No longer on ACE. I think this is fine.   6. Lung nodule - has not had the recommended follow up study - he would like to get updated and we can do this today.   Current medicines are reviewed with the patient today.  The patient does not have concerns regarding medicines other than what has been noted above.  The following changes have been made:  See above.  Labs/ tests ordered today include:    Orders Placed This Encounter  Procedures  . CT Chest Wo Contrast     Disposition:   FU with me in 6 months.   Patient is agreeable to this plan and will call if any problems develop in the interim.   Signed: Burtis Junes, RN, ANP-C 08/04/2015 10:54 AM  [facility] [address] [place], [place]  [address] Phone: [phone] Fax: [phone]

## 2015-08-10 ENCOUNTER — Other Ambulatory Visit: Payer: Self-pay | Admitting: Internal Medicine

## 2015-08-10 DIAGNOSIS — K8020 Calculus of gallbladder without cholecystitis without obstruction: Secondary | ICD-10-CM

## 2015-08-13 ENCOUNTER — Other Ambulatory Visit: Payer: Self-pay | Admitting: Internal Medicine

## 2015-08-13 ENCOUNTER — Ambulatory Visit
Admission: RE | Admit: 2015-08-13 | Discharge: 2015-08-13 | Disposition: A | Discharge status: Home / Self Care | Payer: Medicare Other | Source: Ambulatory Visit | Attending: Internal Medicine | Admitting: Internal Medicine

## 2015-08-13 DIAGNOSIS — K8070 Calculus of gallbladder and bile duct without cholecystitis without obstruction: Secondary | ICD-10-CM

## 2015-08-13 DIAGNOSIS — K8020 Calculus of gallbladder without cholecystitis without obstruction: Secondary | ICD-10-CM

## 2015-10-12 ENCOUNTER — Telehealth: Payer: Self-pay | Admitting: *Deleted

## 2015-10-12 NOTE — Telephone Encounter (Signed)
S/w pt was having difficulties getting prices on eliquis.  Stated t/w pharmacy and stated needed to contact Truitt Merle, NP.  Stated for pt to call pharmacy and get prices and than call us back to see if pt wants to start new medication. Stated understanding.

## 2015-10-22 ENCOUNTER — Other Ambulatory Visit: Payer: Self-pay | Admitting: Nurse Practitioner

## 2015-10-22 MED ORDER — APIXABAN 5 MG PO TABS: 5.0000 mg | ORAL_TABLET | Freq: Two times a day (BID) | ORAL | Status: DC

## 2015-10-22 NOTE — Progress Notes (Signed)
He is wanting to switch to Eliquis 5 mg BID. He has a coumadin visit on June 16 - I have instructed him to have the coumadin clinic at [facility] to get his INR below 2 and then start Eliquis 5 mg BID Lab in one month - he may do here if he wishes.  Burtis Junes, RN, [facility] [address] [place], [place]  [address] [phone]

## 2015-10-28 ENCOUNTER — Encounter: Payer: Self-pay | Admitting: Nurse Practitioner

## 2015-10-29 ENCOUNTER — Telehealth: Payer: Self-pay

## 2015-10-29 NOTE — Telephone Encounter (Signed)
Eliquis 5 mg approved through 05/14/2016. [id number]. Patient notified through email not to stop Coumadin just yet.

## 2015-11-01 ENCOUNTER — Other Ambulatory Visit: Payer: Self-pay | Admitting: *Deleted

## 2015-11-01 ENCOUNTER — Encounter: Payer: Self-pay | Admitting: Nurse Practitioner

## 2015-11-01 MED ORDER — APIXABAN 5 MG PO TABS: 5.0000 mg | ORAL_TABLET | Freq: Two times a day (BID) | ORAL | Status: DC

## 2016-02-08 ENCOUNTER — Ambulatory Visit (INDEPENDENT_AMBULATORY_CARE_PROVIDER_SITE_OTHER): Payer: Medicare Other | Admitting: Nurse Practitioner

## 2016-02-08 ENCOUNTER — Encounter: Payer: Self-pay | Admitting: Nurse Practitioner

## 2016-02-08 VITALS — BP 140/90 | HR 74 | Ht 72.0 in | Wt 229.0 lb

## 2016-02-08 DIAGNOSIS — I482 Chronic atrial fibrillation, unspecified: Secondary | ICD-10-CM

## 2016-02-08 DIAGNOSIS — E785 Hyperlipidemia, unspecified: Secondary | ICD-10-CM | POA: Diagnosis not present

## 2016-02-08 DIAGNOSIS — R911 Solitary pulmonary nodule: Secondary | ICD-10-CM

## 2016-02-08 DIAGNOSIS — Z7901 Long term (current) use of anticoagulants: Secondary | ICD-10-CM | POA: Diagnosis not present

## 2016-02-08 LAB — BASIC METABOLIC PANEL
BUN: 16 mg/dL (ref 7–25)
CO2: 25 mmol/L (ref 20–31)
Calcium: 9.2 mg/dL (ref 8.6–10.3)
Chloride: 104 mmol/L (ref 98–110)
Creat: 1.38 mg/dL — ABNORMAL HIGH (ref 0.70–1.18)
Glucose, Bld: 114 mg/dL — ABNORMAL HIGH (ref 65–99)
Potassium: 3.9 mmol/L (ref 3.5–5.3)
Sodium: 138 mmol/L (ref 135–146)

## 2016-02-08 LAB — CBC
HCT: 43.6 % (ref 38.5–50.0)
Hemoglobin: 14.9 g/dL (ref 13.2–17.1)
MCH: 30.8 pg (ref 27.0–33.0)
MCHC: 34.2 g/dL (ref 32.0–36.0)
MCV: 90.3 fL (ref 80.0–100.0)
MPV: 10.6 fL (ref 7.5–12.5)
Platelets: 149 10*3/uL (ref 140–400)
RBC: 4.83 MIL/uL (ref 4.20–5.80)
RDW: 12.9 % (ref 11.0–15.0)
WBC: 6.3 10*3/uL (ref 3.8–10.8)

## 2016-02-08 NOTE — Patient Instructions (Addendum)
We will be checking the following labs today - BMET and CBC   Medication Instructions:    Continue with your current medicines.      Testing/Procedures To Be Arranged:  N/A  Follow-Up:   See me in 6 months    Other Special Instructions:   N/A    If you need a refill on your cardiac medications before your next appointment, please call your pharmacy.   Call the [facility] office at [phone] if you have any questions, problems or concerns.      

## 2016-02-08 NOTE — Progress Notes (Signed)
CARDIOLOGY OFFICE NOTE  Date:  02/08/2016    [name] Date of Birth: [birth year] Medical Record [id number]  PCP:  Donnajean Lopes, MD  Cardiologist:  Annabell Howells    Chief Complaint  Patient presents with  . Atrial Fibrillation    6 month check - seen for [name]    History of Present Illness: [name] is a 70 y.o. male who presents today for a 6 month check. Seen for [name]. He is a former patient of Dr. Susa Simmonds.   He has chronic atrial fibrillation that has recurred after multiple cardioversions. He is maintained on chronic Coumadin. He had a low risk Myoview in September of 2016. He also has OSA on CPAP, hypertension, hyperlipidemia, obesity. CT of chest 05/11/14 showed stable pulmonary nodules the largest measuring 6 mm with recommended repeat study in one year.  Seen by me back in September of 2016 - had had some spells of chest pain and we got his Myoview updated. This turned out to be low risk. When last seen back in March - he was doing well. We changed him over to Eliquis. CT was updated.   Comes back today. Here alone today. Doing well. Enjoying his music. Has been building a deck for a friend. No chest pain. Some neck issues and planning to have MRI per PCP. BP is better at home - has had 3 cups of coffee this morning. Usually does not do that. He is happy with how he is doing. Not dizzy or lightheaded. No syncope. No real palpitations.   Past Medical History:  Diagnosis Date  . A-fib ([place])   . Arrhythmia    hx. Atrial Fib. ,"with regurgitation"  . Depression   . Diabetes mellitus   . Heart murmur   . Hx of echocardiogram    Echo 3/16:  Mild LVH, EF 60-65%, mild MR, severe LAE, mild RAE, PASP 32 mmHg  . Hypercholesteremia   . Hypertension   . Osteoarthritis (arthritis due to wear and tear of joints)   . Palpitations   . Prostate cancer ([place])    seed implantion- 15 to 20 yrs ago  . Sleep apnea    uses CPAP-settings 5  . Ureter,  stricture    hx. 2 yrs ago-stent has been removed now    Past Surgical History:  Procedure Laterality Date  . CYSTOSCOPY W/ RETROGRADES  04/04/2012   Procedure: CYSTOSCOPY WITH RETROGRADE PYELOGRAM;  Surgeon: Molli Hazard, MD;  Location: [facility];  Service: Urology;  Laterality: Bilateral;  . CYSTOSCOPY WITH RETROGRADE PYELOGRAM, URETEROSCOPY AND STENT PLACEMENT  04/04/2012   Procedure: CYSTOSCOPY WITH RETROGRADE PYELOGRAM, URETEROSCOPY AND STENT PLACEMENT;  Surgeon: Molli Hazard, MD;  Location: [facility];  Service: Urology;  Laterality: Right;  . seed implants    . TOTAL KNEE ARTHROPLASTY Left 08/06/2014   Procedure: LEFT TOTAL KNEE ARTHROPLASTY;  Surgeon: Susa Day, MD;  Location: [facility];  Service: Orthopedics;  Laterality: Left;     Medications: Current Outpatient Prescriptions  Medication Sig Dispense Refill  . allopurinol (ZYLOPRIM) 300 MG tablet Take 300 mg by mouth daily.    Marland Kitchen apixaban (ELIQUIS) 5 MG TABS tablet Take 1 tablet (5 mg total) by mouth 2 (two) times daily. 180 tablet 3  . atenolol-chlorthalidone (TENORETIC) 50-25 MG per tablet Take 1 tablet by mouth daily.    Marland Kitchen atorvastatin (LIPITOR) 10 MG tablet Take 10 mg by mouth every evening.     . colchicine  0.6 MG tablet Take 0.6 mg by mouth as needed.    . metFORMIN (GLUCOPHAGE) 1000 MG tablet Take 500 mg by mouth daily with breakfast.    . Multiple Vitamin (MULTIVITAMIN PO) Take 1 tablet by mouth daily.     . Multiple Vitamins-Minerals (PRESERVISION AREDS 2) CAPS Take 1 capsule by mouth daily.    . traMADol (ULTRAM) 50 MG tablet Take 50 mg by mouth daily. arthritis     No current facility-administered medications for this visit.     Allergies: Allergies  Allergen Reactions  . Sulfa Drugs Cross Reactors Other (See Comments)  . Tetracyclines & Related Other (See Comments)  . Contrast Media [Iodinated Diagnostic Agents] Rash    Social History: The patient  reports that he quit smoking about 42 years  ago. He does not have any smokeless tobacco history on file. He reports that he drinks alcohol. He reports that he does not use drugs.   Family History: The patient's family history includes Cancer in his mother; Dementia in his mother; Heart attack in his father and sister; Stroke in his sister.   Review of Systems: Please see the history of present illness.   Otherwise, the review of systems is positive for none.   All other systems are reviewed and negative.   Physical Exam: VS:  BP 140/90   Pulse 74   Ht 6' (1.829 m)   Wt 229 lb (103.9 kg)   BMI 31.06 kg/m  .  BMI Body mass index is 31.06 kg/m.  Wt Readings from Last 3 Encounters:  02/08/16 229 lb (103.9 kg)  08/04/15 224 lb 12.8 oz (102 kg)  02/19/15 222 lb (100.7 kg)   BP is 130/80 by me.   General: Pleasant. Well developed, well nourished and in no acute distress.   HEENT: Normal.  Neck: Supple, no JVD, carotid bruits, or masses noted.  Cardiac: Irregular irregular rhythm. Rate is ok. No murmurs, rubs, or gallops. No edema.  Respiratory:  Lungs are clear to auscultation bilaterally with normal work of breathing.  GI: Soft and nontender.  MS: No deformity or atrophy. Gait and ROM intact.  Skin: Warm and dry. Color is normal.  Neuro:  Strength and sensation are intact and no gross focal deficits noted.  Psych: Alert, appropriate and with normal affect.   LABORATORY DATA:  EKG:  EKG is ordered today. This demonstrates chronic AF with a controlled VR.  Lab Results  Component Value Date   WBC 5.4 08/10/2014   HGB 10.0 (L) 08/10/2014   HCT 29.7 (L) 08/10/2014   PLT 127 (L) 08/10/2014   GLUCOSE 122 (H) 08/10/2014   ALT 22 09/21/2013   AST 25 09/21/2013   NA 132 (L) 08/10/2014   K 3.7 08/10/2014   CL 95 (L) 08/10/2014   CREATININE 0.99 08/10/2014   BUN 20 08/10/2014   CO2 29 08/10/2014   INR 1.41 08/10/2014    BNP (last 3 results) No results for input(s): BNP in the last 8760 hours.  ProBNP (last 3  results) No results for input(s): PROBNP in the last 8760 hours.   Other Studies Reviewed Today:  Echo Study Conclusions from 07/2014  - Left ventricle: The cavity size was normal. Wall thickness was increased in a pattern of mild LVH. Systolic function was normal. The estimated ejection fraction was in the range of 60% to 65%. - Mitral valve: There was mild regurgitation. - Left atrium: The atrium was severely dilated. - Right atrium: The atrium was mildly  dilated. - Pulmonary arteries: PA peak pressure: 32 mm Hg (S).   CT CHEST IMPRESSION 07/2015: 1. Multiple small solid pulmonary nodules are unchanged from baseline examination of November 2014, consistent with benign findings. 2. There is a single ground-glass nodule in the right middle lobe which is also unchanged and likely benign. Current guidelines call for longer follow-up of such nodules until 5 years of stability has been established. Therefore, consider 1 additional examination approximately 2 years now. This recommendation follows the consensus statement: Guidelines for Management of Incidental Pulmonary Nodules Detected on CT Images:From the Fleischner Society 2017; published online before print (10.1148/radiol.IJ:2314499). 3. Coronary artery atherosclerosis. 4. Hepatic steatosis and cholelithiasis.   Electronically Signed   By: Richardean Sale M.D.   On: 08/04/2015 14:26      Myoview Study Highlights from 02/2015     Defect 1: There is a small defect of mild severity present in the basal inferior and basal inferolateral location.  There was no ST segment deviation noted during stress.  Thinning in the inferior / inferolateral region (base) Otherwise normal perfusion. May represent soft tissue attenuation vs small region of scar Images not gated.  Overall low risk scan.      ASSESSMENT AND PLAN:  1. Multiple CV risk factors - low risk Myoview from October of 2016 noted. Would  continue with current regimen and CV risk factor modification. He has had no more chest pain.   2. Chronic Atrial fibrillation Patient has chronic atrial fibrillation and now on Eliquis. His rate is controlled with Tenormin. [place] lab today.   3. HLD - on statin therapy - labs by PCP  4. DM - followed by PCP  5. HTN - Recheck by me today is ok. I have left him on his current regimen.   6. Lung nodule - does not needs updated until March of 2019.He is aware.    Current medicines are reviewed with the patient today.  The patient does not have concerns regarding medicines other than what has been noted above.  The following changes have been made:  See above.  Labs/ tests ordered today include:    Orders Placed This Encounter  Procedures  . Basic metabolic panel  . CBC  . EKG 12-Lead     Disposition:   FU with me in 6 months.   Patient is agreeable to this plan and will call if any problems develop in the interim.   Signed: Burtis Junes, RN, ANP-C 02/08/2016 8:47 AM  [facility] [address] [place], [place]  [address] Phone: [phone] Fax: [phone]

## 2016-02-09 ENCOUNTER — Telehealth: Payer: Self-pay | Admitting: Cardiology

## 2016-02-09 NOTE — Telephone Encounter (Signed)
New Message  Pt voiced he's returning Danielle's call about results.  Please f/u with pt

## 2016-02-09 NOTE — Telephone Encounter (Signed)
F/u CAll  Mr. Stiteler is returning a call to [employer]

## 2016-02-10 NOTE — Telephone Encounter (Signed)
Follow up    Pt verbalized that he is stressed and that he is calling to get him results and he wants any RN to call him to give him the results

## 2016-02-10 NOTE — Telephone Encounter (Signed)
Called pt to review recent lab result from 02/08/16. Informed CBC and BMET Labs are stable - very mild kidney impairment. Do not take NSAIDs, increase water intake. No change with medicines. Informed pt of the medications that are considered NSAIDS. Pt verbalized understanding and agreed with plan. He thanked me for the call.

## 2016-03-14 ENCOUNTER — Ambulatory Visit
Admission: RE | Admit: 2016-03-14 | Discharge: 2016-03-14 | Disposition: A | Discharge status: Home / Self Care | Payer: Medicare Other | Source: Ambulatory Visit | Attending: Internal Medicine | Admitting: Internal Medicine

## 2016-03-14 ENCOUNTER — Other Ambulatory Visit: Payer: Self-pay | Admitting: Internal Medicine

## 2016-03-14 DIAGNOSIS — M542 Cervicalgia: Secondary | ICD-10-CM

## 2016-03-16 ENCOUNTER — Other Ambulatory Visit (HOSPITAL_COMMUNITY): Payer: Self-pay | Admitting: Internal Medicine

## 2016-03-16 DIAGNOSIS — K8010 Calculus of gallbladder with chronic cholecystitis without obstruction: Secondary | ICD-10-CM

## 2016-03-17 ENCOUNTER — Other Ambulatory Visit: Payer: Self-pay | Admitting: Internal Medicine

## 2016-03-17 DIAGNOSIS — M542 Cervicalgia: Secondary | ICD-10-CM

## 2016-03-17 DIAGNOSIS — M5030 Other cervical disc degeneration, unspecified cervical region: Secondary | ICD-10-CM

## 2016-03-21 ENCOUNTER — Encounter (HOSPITAL_COMMUNITY)
Admission: RE | Admit: 2016-03-21 | Discharge: 2016-03-21 | Disposition: A | Discharge status: Home / Self Care | Payer: Medicare Other | Source: Ambulatory Visit | Attending: Internal Medicine | Admitting: Internal Medicine

## 2016-03-21 DIAGNOSIS — K8000 Calculus of gallbladder with acute cholecystitis without obstruction: Secondary | ICD-10-CM | POA: Insufficient documentation

## 2016-03-21 DIAGNOSIS — K8010 Calculus of gallbladder with chronic cholecystitis without obstruction: Secondary | ICD-10-CM

## 2016-03-21 MED ORDER — MORPHINE SULFATE (PF) 4 MG/ML IV SOLN
INTRAVENOUS | Status: AC
  Administered 2016-03-21: 3 mg
  Filled 2016-03-21: qty 1

## 2016-03-21 MED ORDER — MORPHINE SULFATE (PF) 4 MG/ML IV SOLN
INTRAVENOUS | Status: AC
  Filled 2016-03-21: qty 1

## 2016-03-21 MED ORDER — TECHNETIUM TC 99M MEBROFENIN IV KIT
5.0000 | PACK | Freq: Once | INTRAVENOUS | Status: AC | PRN
  Administered 2016-03-21: 5 via INTRAVENOUS

## 2016-03-26 ENCOUNTER — Ambulatory Visit
Admission: RE | Admit: 2016-03-26 | Discharge: 2016-03-26 | Disposition: A | Discharge status: Home / Self Care | Payer: Medicare Other | Source: Ambulatory Visit | Attending: Internal Medicine | Admitting: Internal Medicine

## 2016-03-26 DIAGNOSIS — M5030 Other cervical disc degeneration, unspecified cervical region: Secondary | ICD-10-CM

## 2016-03-26 DIAGNOSIS — M542 Cervicalgia: Secondary | ICD-10-CM

## 2016-07-18 ENCOUNTER — Telehealth: Payer: Self-pay | Admitting: Nurse Practitioner

## 2016-07-18 NOTE — Telephone Encounter (Signed)
His medicines - specifically the tenoretic has a beta blocker in it. This will not allow his heart rate to elevate much in response to exercise. But I would try to keep HR under 120 or any heart rate that is associated with symptoms.

## 2016-07-18 NOTE — Telephone Encounter (Signed)
New message      Pt has started an exercise program.  The trainer want to know what is a good heart rate for him to achieve?  Please call

## 2016-07-18 NOTE — Telephone Encounter (Signed)
Pt is aware of [name]'s recommendation's. 

## 2016-07-24 NOTE — Progress Notes (Deleted)
CARDIOLOGY OFFICE NOTE  Date:  07/24/2016    [name] Date of Birth: 14-May-1946 Medical Record #[id number]  PCP:  Donnajean Lopes, MD  Cardiologist:  Servando Snare & ***    No chief complaint on file.   History of Present Illness: [name] is a 71 y.o. male who presents today for a ***   Comes in today. Here with   Past Medical History:  Diagnosis Date  . A-fib ([place])   . Arrhythmia    hx. Atrial Fib. ,"with regurgitation"  . Depression   . Diabetes mellitus   . Heart murmur   . Hx of echocardiogram    Echo 3/16:  Mild LVH, EF 60-65%, mild MR, severe LAE, mild RAE, PASP 32 mmHg  . Hypercholesteremia   . Hypertension   . Osteoarthritis (arthritis due to wear and tear of joints)   . Palpitations   . Prostate cancer ([place])    seed implantion- 15 to 20 yrs ago  . Sleep apnea    uses CPAP-settings 5  . Ureter, stricture    hx. 2 yrs ago-stent has been removed now    Past Surgical History:  Procedure Laterality Date  . CYSTOSCOPY W/ RETROGRADES  04/04/2012   Procedure: CYSTOSCOPY WITH RETROGRADE PYELOGRAM;  Surgeon: Molli Hazard, MD;  Location: [facility];  Service: Urology;  Laterality: Bilateral;  . CYSTOSCOPY WITH RETROGRADE PYELOGRAM, URETEROSCOPY AND STENT PLACEMENT  04/04/2012   Procedure: CYSTOSCOPY WITH RETROGRADE PYELOGRAM, URETEROSCOPY AND STENT PLACEMENT;  Surgeon: Molli Hazard, MD;  Location: [facility];  Service: Urology;  Laterality: Right;  . seed implants    . TOTAL KNEE ARTHROPLASTY Left 08/06/2014   Procedure: LEFT TOTAL KNEE ARTHROPLASTY;  Surgeon: Susa Day, MD;  Location: [facility];  Service: Orthopedics;  Laterality: Left;     Medications: Current Outpatient Prescriptions  Medication Sig Dispense Refill  . allopurinol (ZYLOPRIM) 300 MG tablet Take 300 mg by mouth daily.    Marland Kitchen apixaban (ELIQUIS) 5 MG TABS tablet Take 1 tablet (5 mg total) by mouth 2 (two) times daily. 180 tablet 3  . atenolol-chlorthalidone (TENORETIC) 50-25  MG per tablet Take 1 tablet by mouth daily.    Marland Kitchen atorvastatin (LIPITOR) 10 MG tablet Take 10 mg by mouth every evening.     . colchicine 0.6 MG tablet Take 0.6 mg by mouth as needed.    . metFORMIN (GLUCOPHAGE) 1000 MG tablet Take 500 mg by mouth daily with breakfast.    . Multiple Vitamin (MULTIVITAMIN PO) Take 1 tablet by mouth daily.     . Multiple Vitamins-Minerals (PRESERVISION AREDS 2) CAPS Take 1 capsule by mouth daily.    . traMADol (ULTRAM) 50 MG tablet Take 50 mg by mouth daily. arthritis     No current facility-administered medications for this visit.     Allergies: Allergies  Allergen Reactions  . Sulfa Drugs Cross Reactors Other (See Comments)  . Tetracyclines & Related Other (See Comments)  . Contrast Media [Iodinated Diagnostic Agents] Rash    Social History: The patient  reports that he quit smoking about 43 years ago. He does not have any smokeless tobacco history on file. He reports that he drinks alcohol. He reports that he does not use drugs.   Family History: The patient's ***family history includes Cancer in his mother; Dementia in his mother; Heart attack in his father and sister; Stroke in his sister.   Review of Systems: Please see the history of present illness.  Otherwise, the review of systems is positive for {NONE DEFAULTED:18576::"none"}.   All other systems are reviewed and negative.   Physical Exam: VS:  There were no vitals taken for this visit. Marland Kitchen  BMI There is no height or weight on file to calculate BMI.  Wt Readings from Last 3 Encounters:  02/08/16 229 lb (103.9 kg)  08/04/15 224 lb 12.8 oz (102 kg)  02/19/15 222 lb (100.7 kg)    General: Pleasant. Well developed, well nourished and in no acute distress.   HEENT: Normal.  Neck: Supple, no JVD, carotid bruits, or masses noted.  Cardiac: ***Regular rate and rhythm. No murmurs, rubs, or gallops. No edema.  Respiratory:  Lungs are clear to auscultation bilaterally with normal work of  breathing.  GI: Soft and nontender.  MS: No deformity or atrophy. Gait and ROM intact.  Skin: Warm and dry. Color is normal.  Neuro:  Strength and sensation are intact and no gross focal deficits noted.  Psych: Alert, appropriate and with normal affect.   LABORATORY DATA:  EKG:  EKG {ACTION; IS/IS SRP:[id number]} ordered today. This demonstrates ***.  Lab Results  Component Value Date   WBC 6.3 02/08/2016   HGB 14.9 02/08/2016   HCT 43.6 02/08/2016   PLT 149 02/08/2016   GLUCOSE 114 (H) 02/08/2016   ALT 22 09/21/2013   AST 25 09/21/2013   NA 138 02/08/2016   K 3.9 02/08/2016   CL 104 02/08/2016   CREATININE 1.38 (H) 02/08/2016   BUN 16 02/08/2016   CO2 25 02/08/2016   INR 1.41 08/10/2014    BNP (last 3 results) No results for input(s): BNP in the last 8760 hours.  ProBNP (last 3 results) No results for input(s): PROBNP in the last 8760 hours.   Other Studies Reviewed Today:   Assessment/Plan:   Current medicines are reviewed with the patient today.  The patient does not have concerns regarding medicines other than what has been noted above.  The following changes have been made:  See above.  Labs/ tests ordered today include:   No orders of the defined types were placed in this encounter.    Disposition:   FU with *** in {gen number 9-24:[id number]} {Days to years:10300}.   Patient is agreeable to this plan and will call if any problems develop in the interim.   SignedTruitt Merle, NP  07/24/2016 12:42 PM  [facility] [address] [place], [place]  [address] Phone: [phone] Fax: [phone]

## 2016-07-25 ENCOUNTER — Ambulatory Visit: Payer: Medicare Other | Admitting: Nurse Practitioner

## 2016-08-02 ENCOUNTER — Encounter (INDEPENDENT_AMBULATORY_CARE_PROVIDER_SITE_OTHER): Payer: Self-pay

## 2016-08-02 ENCOUNTER — Ambulatory Visit (INDEPENDENT_AMBULATORY_CARE_PROVIDER_SITE_OTHER): Payer: Medicare Other | Admitting: Nurse Practitioner

## 2016-08-02 ENCOUNTER — Encounter: Payer: Self-pay | Admitting: Nurse Practitioner

## 2016-08-02 VITALS — BP 160/80 | HR 72 | Ht 72.0 in | Wt 233.0 lb

## 2016-08-02 DIAGNOSIS — I482 Chronic atrial fibrillation, unspecified: Secondary | ICD-10-CM

## 2016-08-02 DIAGNOSIS — Z7901 Long term (current) use of anticoagulants: Secondary | ICD-10-CM | POA: Diagnosis not present

## 2016-08-02 LAB — BASIC METABOLIC PANEL
BUN/Creatinine Ratio: 22 (ref 10–24)
BUN: 24 mg/dL (ref 8–27)
CO2: 24 mmol/L (ref 18–29)
Calcium: 9.2 mg/dL (ref 8.6–10.2)
Chloride: 101 mmol/L (ref 96–106)
Creatinine, Ser: 1.08 mg/dL (ref 0.76–1.27)
GFR calc Af Amer: 79 mL/min/{1.73_m2} (ref >59)
GFR calc non Af Amer: 68 mL/min/{1.73_m2} (ref >59)
Glucose: 103 mg/dL — ABNORMAL HIGH (ref 65–99)
Potassium: 4 mmol/L (ref 3.5–5.2)
Sodium: 140 mmol/L (ref 134–144)

## 2016-08-02 LAB — CBC
Hematocrit: 42.7 % (ref 37.5–51.0)
Hemoglobin: 14.7 g/dL (ref 13.0–17.7)
MCH: 31.3 pg (ref 26.6–33.0)
MCHC: 34.4 g/dL (ref 31.5–35.7)
MCV: 91 fL (ref 79–97)
Platelets: 129 10*3/uL — ABNORMAL LOW (ref 150–379)
RBC: 4.7 x10E6/uL (ref 4.14–5.80)
RDW: 13.8 % (ref 12.3–15.4)
WBC: 5.3 10*3/uL (ref 3.4–10.8)

## 2016-08-02 NOTE — Patient Instructions (Addendum)
We will be checking the following labs today - BMET and CBC   Medication Instructions:    Continue with your current medicines.     Testing/Procedures To Be Arranged:  N/A  Follow-Up:   See me in 6 months    Other Special Instructions:   Keep up the good work!    If you need a refill on your cardiac medications before your next appointment, please call your pharmacy.   Call the [facility] office at [phone] if you have any questions, problems or concerns.      

## 2016-08-02 NOTE — Progress Notes (Signed)
CARDIOLOGY OFFICE NOTE  Date:  08/02/2016    [name] Date of Birth: May 27, 1945 Medical Record #[id number]  PCP:  Donnajean Lopes, MD  Cardiologist:  Annabell Howells    Chief Complaint  Patient presents with  . Atrial Fibrillation    6 month check - seen for [name]    History of Present Illness: [name] is a 71 y.o. male who presents today for a follow up visit. Seen for [name]. He is a former patient of Dr. Susa Simmonds.   He has chronic atrial fibrillation that has recurred after multiple cardioversions. He is maintained on chronic Coumadin. He had a low risk Myoview in September of 2016. He also has OSA on CPAP, hypertension, hyperlipidemia, obesity. CT of chest 05/11/14 showed stable pulmonary nodules the largest measuring 6 mm with recommended repeat study in one year.  Seen by me back in September of 2016 - had had some spells of chest pain and we got his Myoview updated. This turned out to be low risk. When seen back in March - he was doing well. We changed him over to Eliquis. CT was updated. Last seen back in September and he was doing well.   Comes back today. Here alone with his wife - [name] - I take care of her as well. He is doing well. They have both started working out 3 times a week. He feels good. No chest pain. Not short of breath. No bleeding/bruising issues. He is very happy with how he is doing.   Past Medical History:  Diagnosis Date  . A-fib ([place])   . Arrhythmia    hx. Atrial Fib. ,"with regurgitation"  . Depression   . Diabetes mellitus   . Heart murmur   . Hx of echocardiogram    Echo 3/16:  Mild LVH, EF 60-65%, mild MR, severe LAE, mild RAE, PASP 32 mmHg  . Hypercholesteremia   . Hypertension   . Osteoarthritis (arthritis due to wear and tear of joints)   . Palpitations   . Prostate cancer ([place])    seed implantion- 15 to 20 yrs ago  . Sleep apnea    uses CPAP-settings 5  . Ureter, stricture    hx. 2 yrs ago-stent has been  removed now    Past Surgical History:  Procedure Laterality Date  . CYSTOSCOPY W/ RETROGRADES  04/04/2012   Procedure: CYSTOSCOPY WITH RETROGRADE PYELOGRAM;  Surgeon: Molli Hazard, MD;  Location: [facility];  Service: Urology;  Laterality: Bilateral;  . CYSTOSCOPY WITH RETROGRADE PYELOGRAM, URETEROSCOPY AND STENT PLACEMENT  04/04/2012   Procedure: CYSTOSCOPY WITH RETROGRADE PYELOGRAM, URETEROSCOPY AND STENT PLACEMENT;  Surgeon: Molli Hazard, MD;  Location: [facility];  Service: Urology;  Laterality: Right;  . seed implants    . TOTAL KNEE ARTHROPLASTY Left 08/06/2014   Procedure: LEFT TOTAL KNEE ARTHROPLASTY;  Surgeon: Susa Day, MD;  Location: [facility];  Service: Orthopedics;  Laterality: Left;     Medications: Current Outpatient Prescriptions  Medication Sig Dispense Refill  . allopurinol (ZYLOPRIM) 300 MG tablet Take 300 mg by mouth daily.    Marland Kitchen apixaban (ELIQUIS) 5 MG TABS tablet Take 1 tablet (5 mg total) by mouth 2 (two) times daily. 180 tablet 3  . atenolol-chlorthalidone (TENORETIC) 50-25 MG per tablet Take 1 tablet by mouth daily.    Marland Kitchen atorvastatin (LIPITOR) 10 MG tablet Take 10 mg by mouth every evening.     . celecoxib (CELEBREX) 200 MG capsule Take  200 mg by mouth daily.     . colchicine 0.6 MG tablet Take 0.6 mg by mouth as needed.    . metFORMIN (GLUCOPHAGE) 1000 MG tablet Take 500 mg by mouth daily with breakfast.    . Multiple Vitamin (MULTIVITAMIN PO) Take 1 tablet by mouth daily.     . Multiple Vitamins-Minerals (PRESERVISION AREDS 2) CAPS Take 1 capsule by mouth daily.    . traMADol (ULTRAM) 50 MG tablet Take 50 mg by mouth daily. arthritis     No current facility-administered medications for this visit.     Allergies: Allergies  Allergen Reactions  . Doxycycline Itching  . Ioxaglate Rash  . Sulfa Drugs Cross Reactors Other (See Comments)  . Tetracyclines & Related Other (See Comments)  . Contrast Media [Iodinated Diagnostic Agents] Rash     Social History: The patient  reports that he quit smoking about 43 years ago. He has never used smokeless tobacco. He reports that he drinks alcohol. He reports that he does not use drugs.   Family History: The patient's family history includes Cancer in his mother; Dementia in his mother; Heart attack in his father and sister; Stroke in his sister.   Review of Systems: Please see the history of present illness.   Otherwise, the review of systems is positive for none.   All other systems are reviewed and negative.   Physical Exam: VS:  BP (!) 160/80   Pulse 72   Ht 6' (1.829 m)   Wt 233 lb (105.7 kg)   SpO2 96% Comment: at rest  BMI 31.60 kg/m  .  BMI Body mass index is 31.6 kg/m.  Wt Readings from Last 3 Encounters:  08/02/16 233 lb (105.7 kg)  02/08/16 229 lb (103.9 kg)  08/04/15 224 lb 12.8 oz (102 kg)   BP recheck by me is 122/80  General: Pleasant. Well developed, well nourished and in no acute distress.   HEENT: Normal.  Neck: Supple, no JVD, carotid bruits, or masses noted.  Cardiac: Irregular irregular rhythm. Rate is ok. No murmurs, rubs, or gallops. No edema.  Respiratory:  Lungs are clear to auscultation bilaterally with normal work of breathing.  GI: Soft and nontender.  MS: No deformity or atrophy. Gait and ROM intact.  Skin: Warm and dry. Color is normal.  Neuro:  Strength and sensation are intact and no gross focal deficits noted.  Psych: Alert, appropriate and with normal affect.   LABORATORY DATA:  EKG:  EKG is not ordered today.  Lab Results  Component Value Date   WBC 6.3 02/08/2016   HGB 14.9 02/08/2016   HCT 43.6 02/08/2016   PLT 149 02/08/2016   GLUCOSE 114 (H) 02/08/2016   ALT 22 09/21/2013   AST 25 09/21/2013   NA 138 02/08/2016   K 3.9 02/08/2016   CL 104 02/08/2016   CREATININE 1.38 (H) 02/08/2016   BUN 16 02/08/2016   CO2 25 02/08/2016   INR 1.41 08/10/2014    BNP (last 3 results) No results for input(s): BNP in the last  8760 hours.  ProBNP (last 3 results) No results for input(s): PROBNP in the last 8760 hours.   Other Studies Reviewed Today:  Echo Study Conclusions from 07/2014  - Left ventricle: The cavity size was normal. Wall thickness was increased in a pattern of mild LVH. Systolic function was normal. The estimated ejection fraction was in the range of 60% to 65%. - Mitral valve: There was mild regurgitation. - Left atrium: The  atrium was severely dilated. - Right atrium: The atrium was mildly dilated. - Pulmonary arteries: PA peak pressure: 32 mm Hg (S).   CT CHEST IMPRESSION 07/2015: 1. Multiple small solid pulmonary nodules are unchanged from baseline examination of November 2014, consistent with benign findings. 2. There is a single ground-glass nodule in the right middle lobe which is also unchanged and likely benign. Current guidelines call for longer follow-up of such nodules until 5 years of stability has been established. Therefore, consider 1 additional examination approximately 2 years now. This recommendation follows the consensus statement: Guidelines for Management of Incidental Pulmonary Nodules Detected on CT Images:From the Fleischner Society 2017; published online before print (10.1148/radiol.0037048889). 3. Coronary artery atherosclerosis. 4. Hepatic steatosis and cholelithiasis.   Electronically Signed By: Richardean Sale M.D. On: 08/04/2015 14:26      Myoview Study Highlights from 02/2015    Defect 1: There is a small defect of mild severity present in the basal inferior and basal inferolateral location.  There was no ST segment deviation noted during stress.  Thinning in the inferior / inferolateral region (base) Otherwise normal perfusion. May represent soft tissue attenuation vs small region of scar Images not gated.  Overall low risk scan.      ASSESSMENT AND PLAN:  1. Multiple CV risk factors - low risk Myoview from  October of 2016 noted. Would continue with current regimen and CV risk factor modification. He has had no more chest pain. He is doing very well clinically. Has started exercising - I encouraged him to continue.   2. Chronic Atrial fibrillation Patient has chronic atrial fibrillation and now on Eliquis. His rate is controlled with Tenormin. [place] lab today.   3. HLD- on statin therapy - lipids followed by PCP  4. DM- followed by PCP  5. HTN- Recheck by me today is 122/80. No change with current regimen.   6. Lung nodule- does not needs updated until March of 2019.He is aware.   Current medicines are reviewed with the patient today.  The patient does not have concerns regarding medicines other than what has been noted above.  The following changes have been made:  See above.  Labs/ tests ordered today include:    Orders Placed This Encounter  Procedures  . Basic metabolic panel  . CBC     Disposition:   FU with me in 6 months.   Patient is agreeable to this plan and will call if any problems develop in the interim.   SignedTruitt Merle, NP  08/02/2016 11:52 AM  [facility] [address] [place], [place]  [address] Phone: [phone] Fax: [phone]

## 2016-08-08 ENCOUNTER — Encounter: Payer: Self-pay | Admitting: *Deleted

## 2016-08-14 ENCOUNTER — Telehealth: Payer: Self-pay | Admitting: Nurse Practitioner

## 2016-08-14 NOTE — Telephone Encounter (Signed)
New message    Pt is returning call about his labs.

## 2016-08-14 NOTE — Telephone Encounter (Signed)
Pt is aware of recent labs results do to phone blocking our office was not able to get in touch with pt.  Pt unblocked phone.

## 2016-08-24 NOTE — Progress Notes (Signed)
error 

## 2016-10-05 ENCOUNTER — Other Ambulatory Visit: Payer: Self-pay | Admitting: Nurse Practitioner

## 2016-10-23 ENCOUNTER — Other Ambulatory Visit: Payer: Self-pay

## 2016-10-23 MED ORDER — APIXABAN 5 MG PO TABS: 5.0000 mg | ORAL_TABLET | Freq: Two times a day (BID) | ORAL | 1 refills | Status: DC

## 2016-10-23 NOTE — Telephone Encounter (Signed)
Pt last saw Truitt Merle, NP on 08/02/16, last labs 08/02/16 Creat 1.08, weight 105.7kg, age 71, based on specified criteria pt is on appropriate dosage of Eliquis 5mg  BID.  Will refill rx.

## 2016-11-13 ENCOUNTER — Telehealth: Payer: Self-pay | Admitting: *Deleted

## 2016-11-13 NOTE — Telephone Encounter (Signed)
Pt has been cleared. Faxed ov note for surgical clearance to [facility] To Dr. Darleen Crocker @ [phone] phone # is [phone].

## 2016-12-21 IMAGING — NM NM HEPATO W/GB/PHARM/[PERSON_NAME]
3 series · 18 of 18 positions shown · non-contrast
Comparison: Right upper quadrant ultrasound dated 08/13/2015

CLINICAL DATA: Cholelithiasis

EXAM:
NUCLEAR MEDICINE HEPATOBILIARY IMAGING
TECHNIQUE: Sequential images of the abdomen were obtained [DATE] minutes
following intravenous administration of radiopharmaceutical.
RADIOPHARMACEUTICALS:  6.3 mCi Dc-00m  Choletec IV

[he hepatobiliary · 3.43mm/px · 6 of 30 frames shown (1 of 3)]
[frame 3/30]
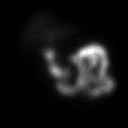
[frame 8/30]
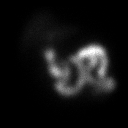
[frame 13/30]
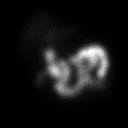
[frame 18/30]
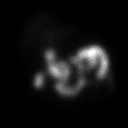
[frame 23/30]
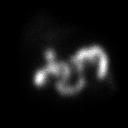
[frame 28/30]
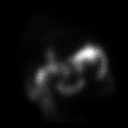

[he hepatobiliary · 3.43mm/px · 6 of 56 frames shown (2 of 3)]
[frame 5/56]
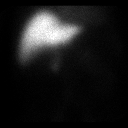
[frame 14/56]
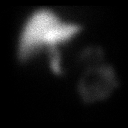
[frame 24/56]
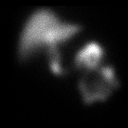
[frame 33/56]
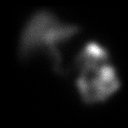
[frame 42/56]
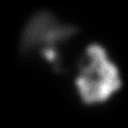
[frame 52/56]
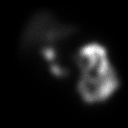

[he hepatobiliary · 3.43mm/px · 6 of 30 frames shown (3 of 3)]
[frame 3/30]
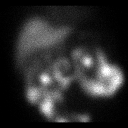
[frame 8/30]
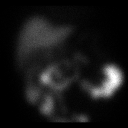
[frame 13/30]
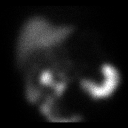
[frame 18/30]
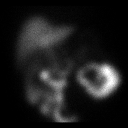
[frame 23/30]
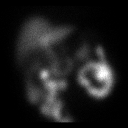
[frame 28/30]
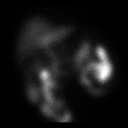

[18 of 18 positions shown; findings below may reference images not displayed]

FINDINGS: Prompt uptake and biliary excretion of activity by the liver is
seen.

Gallbladder activity is visualized at 80 minutes, consistent with
patency of cystic duct.

Biliary activity passes into small bowel at 20 minutes, consistent
with patent common bile duct.
IMPRESSION: No evidence of acute cholecystitis.

## 2017-01-31 ENCOUNTER — Ambulatory Visit: Payer: Medicare Other | Admitting: Nurse Practitioner

## 2017-02-21 ENCOUNTER — Encounter: Payer: Self-pay | Admitting: Nurse Practitioner

## 2017-02-21 ENCOUNTER — Encounter (INDEPENDENT_AMBULATORY_CARE_PROVIDER_SITE_OTHER): Payer: Self-pay

## 2017-02-21 ENCOUNTER — Ambulatory Visit (INDEPENDENT_AMBULATORY_CARE_PROVIDER_SITE_OTHER): Payer: Medicare Other | Admitting: Nurse Practitioner

## 2017-02-21 VITALS — BP 160/90 | HR 67 | Ht 72.0 in | Wt 227.8 lb

## 2017-02-21 DIAGNOSIS — Z7901 Long term (current) use of anticoagulants: Secondary | ICD-10-CM | POA: Diagnosis not present

## 2017-02-21 DIAGNOSIS — E785 Hyperlipidemia, unspecified: Secondary | ICD-10-CM | POA: Diagnosis not present

## 2017-02-21 DIAGNOSIS — I482 Chronic atrial fibrillation, unspecified: Secondary | ICD-10-CM

## 2017-02-21 NOTE — Patient Instructions (Signed)
We will be checking the following labs today - BMET and CBC   Medication Instructions:    Continue with your current medicines.     Testing/Procedures To Be Arranged:  N/A  Follow-Up:   See me tentatively in 6 months    Other Special Instructions:   Cut back on the salt  Monitor BP - send me a message in the next few weeks with your readings - goal is less than 135/85 or less    If you need a refill on your cardiac medications before your next appointment, please call your pharmacy.   Call the [place] office at [phone] if you have any questions, problems or concerns.

## 2017-02-21 NOTE — Progress Notes (Signed)
CARDIOLOGY OFFICE NOTE  Date:  02/21/2017    [name] Date of Birth: [birth year] Medical Record #[id number]  PCP:  Leanna Battles, MD  Cardiologist:  Annabell Howells  Chief Complaint  Patient presents with  . Atrial Fibrillation     6 month check    History of Present Illness: [name] is a 71 y.o. male who presents today for a 6 month check. Seen for [name]. He is a former patient of Dr. Susa Simmonds. He basically follows with me.   He has chronic atrial fibrillation that has recurred after multiple cardioversions. He was previously on Coumadin and then switched over to Eliquis. He had a low risk Myoview in October of 2016. He also has OSA on CPAP, hypertension, hyperlipidemia, obesity. CT of chest 05/11/14 showed stable pulmonary nodules the largest measuring 6 mm with recommended repeat study in one year.  Last seen back in March and he was doing well.    Comes back today. Here with his wife - [name] - I take care of her as well. He feels like he is doing well. BP little high - can't find his BP cuff at home. No chest pain. Breathing is good. Tolerating his Eliquis. Getting his physical in December with [name] and will have his labs. His biggest issue is his back - has had MRI but does not need surgery - hopefully injections will be sufficient. He does use salt - probably to excess. Not exercising due to his back pain.   Past Medical History:  Diagnosis Date  . A-fib ([place])   . Arrhythmia    hx. Atrial Fib. ,"with regurgitation"  . Depression   . Diabetes mellitus   . Heart murmur   . Hx of echocardiogram    Echo 3/16:  Mild LVH, EF 60-65%, mild MR, severe LAE, mild RAE, PASP 32 mmHg  . Hypercholesteremia   . Hypertension   . Osteoarthritis (arthritis due to wear and tear of joints)   . Palpitations   . Prostate cancer ([place])    seed implantion- 15 to 20 yrs ago  . Sleep apnea    uses CPAP-settings 5  . Ureter, stricture    hx. 2 yrs ago-stent  has been removed now    Past Surgical History:  Procedure Laterality Date  . CYSTOSCOPY W/ RETROGRADES  04/04/2012   Procedure: CYSTOSCOPY WITH RETROGRADE PYELOGRAM;  Surgeon: Molli Hazard, MD;  Location: [facility];  Service: Urology;  Laterality: Bilateral;  . CYSTOSCOPY WITH RETROGRADE PYELOGRAM, URETEROSCOPY AND STENT PLACEMENT  04/04/2012   Procedure: CYSTOSCOPY WITH RETROGRADE PYELOGRAM, URETEROSCOPY AND STENT PLACEMENT;  Surgeon: Molli Hazard, MD;  Location: [facility];  Service: Urology;  Laterality: Right;  . seed implants    . TOTAL KNEE ARTHROPLASTY Left 08/06/2014   Procedure: LEFT TOTAL KNEE ARTHROPLASTY;  Surgeon: Susa Day, MD;  Location: [facility];  Service: Orthopedics;  Laterality: Left;     Medications: Current Meds  Medication Sig  . allopurinol (ZYLOPRIM) 300 MG tablet Take 300 mg by mouth daily.  Marland Kitchen apixaban (ELIQUIS) 5 MG TABS tablet Take 1 tablet (5 mg total) by mouth 2 (two) times daily.  Marland Kitchen atenolol-chlorthalidone (TENORETIC) 50-25 MG per tablet Take 1 tablet by mouth daily.  Marland Kitchen atorvastatin (LIPITOR) 10 MG tablet Take 10 mg by mouth every evening.   . celecoxib (CELEBREX) 200 MG capsule Take 200 mg by mouth daily.   . colchicine 0.6 MG tablet Take 0.6 mg by  mouth as needed.  . gabapentin (NEURONTIN) 100 MG capsule TAKE 1 CAP 3 TIMES DAILY X1 WEEK THEN 2 CAPS 3 TIMES DAILY X1 WEEK THEN 3 CAPS 3 TIMES DAILY  . metFORMIN (GLUCOPHAGE) 1000 MG tablet Take 500 mg by mouth daily with breakfast.  . Multiple Vitamin (MULTIVITAMIN PO) Take 1 tablet by mouth daily.   . Multiple Vitamins-Minerals (PRESERVISION AREDS 2) CAPS Take 1 capsule by mouth daily.  . traMADol (ULTRAM) 50 MG tablet Take 50 mg by mouth daily. arthritis     Allergies: Allergies  Allergen Reactions  . Doxycycline Itching  . Ioxaglate Rash  . Sulfa Drugs Cross Reactors Other (See Comments)  . Tetracyclines & Related Other (See Comments)  . Contrast Media [Iodinated Diagnostic Agents]  Rash    Social History: The patient  reports that he quit smoking about 43 years ago. He has never used smokeless tobacco. He reports that he drinks alcohol. He reports that he does not use drugs.   Family History: The patient's family history includes Cancer in his mother; Dementia in his mother; Heart attack in his father and sister; Stroke in his sister.   Review of Systems: Please see the history of present illness.   Otherwise, the review of systems is positive for none.   All other systems are reviewed and negative.   Physical Exam: VS:  BP (!) 160/90 (BP Location: Left Arm, Patient Position: Sitting, Cuff Size: Normal)   Pulse 67   Ht 6' (1.829 m)   Wt 227 lb 12.8 oz (103.3 kg)   BMI 30.90 kg/m  .  BMI Body mass index is 30.9 kg/m.  Wt Readings from Last 3 Encounters:  02/21/17 227 lb 12.8 oz (103.3 kg)  08/02/16 233 lb (105.7 kg)  02/08/16 229 lb (103.9 kg)    Benitez: Pleasant. Well developed, well nourished and in no acute distress.   HEENT: Normal.  Neck: Supple, no JVD, carotid bruits, or masses noted.  Cardiac: Irregular irregular rhythm. Rate is fine. No murmurs, rubs, or gallops. No edema.  Respiratory:  Lungs are clear to auscultation bilaterally with normal work of breathing.  GI: Soft and nontender.  MS: No deformity or atrophy. Gait and ROM intact.  Skin: Warm and dry. Color is normal.  Neuro:  Strength and sensation are intact and no gross focal deficits noted.  Psych: Alert, appropriate and with normal affect.   LABORATORY DATA:  EKG:  EKG is ordered today. This demonstrates atrial fib with a controlled VR of 67.   Lab Results  Component Value Date   WBC 5.3 08/02/2016   HGB 14.7 08/02/2016   HCT 42.7 08/02/2016   PLT 129 (L) 08/02/2016   GLUCOSE 103 (H) 08/02/2016   ALT 22 09/21/2013   AST 25 09/21/2013   NA 140 08/02/2016   K 4.0 08/02/2016   CL 101 08/02/2016   CREATININE 1.08 08/02/2016   BUN 24 08/02/2016   CO2 24 08/02/2016   INR  1.41 08/10/2014     BNP (last 3 results) No results for input(s): BNP in the last 8760 hours.  ProBNP (last 3 results) No results for input(s): PROBNP in the last 8760 hours.   Other Studies Reviewed Today:  CT CHEST IMPRESSION 07/2015: 1. Multiple small solid pulmonary nodules are unchanged from baseline examination of November 2014, consistent with benign findings. 2. There is a single ground-glass nodule in the right middle lobe which is also unchanged and likely benign. Current guidelines call for longer follow-up of  such nodules until 5 years of stability has been established. Therefore, consider 1 additional examination approximately 2 years now. This recommendation follows the consensus statement: Guidelines for Management of Incidental Pulmonary Nodules Detected on CT Images:From the Fleischner Society 2017; published online before print (10.1148/radiol.8185631497). 3. Coronary artery atherosclerosis. 4. Hepatic steatosis and cholelithiasis.   Electronically Signed By: Richardean Sale M.D. On: 08/04/2015 14:26   Echo Study Conclusions from 07/2014  - Left ventricle: The cavity size was normal. Wall thickness was increased in a pattern of mild LVH. Systolic function was normal. The estimated ejection fraction was in the range of 60% to 65%. - Mitral valve: There was mild regurgitation. - Left atrium: The atrium was severely dilated. - Right atrium: The atrium was mildly dilated. - Pulmonary arteries: PA peak pressure: 32 mm Hg (S).      Myoview Study Highlights from 02/2015    Defect 1: There is a small defect of mild severity present in the basal inferior and basal inferolateral location.  There was no ST segment deviation noted during stress.  Thinning in the inferior / inferolateral region (base) Otherwise normal perfusion. May represent soft tissue attenuation vs small region of scar Images not gated.  Overall low risk scan.        ASSESSMENT AND PLAN:  1. Multiple CV risk factors- low risk Myoview from October of 2016noted. Would continue with current regimen and CV risk factor modification. He has had no more chest pain. He is doing ok except for back pain and elevated BP.   2. Chronic Atrial fibrillation Patient has chronic atrial fibrillation and now on Eliquis. His rate is controlled with Tenormin. [place] lab today. No excessive bruising and no bleeding noted.   3. HLD- on statin therapy - lipids followed by PCP  4. DM- followed by PCP  5. HTN- Recheck by me today is 150/80. He admits he is getting too much salt - will try to cut back - will monitor BP at home and send me a message in a couple of weeks - may need additional medicines.    6. Lung nodule- does not needs updated untilMarch of 2019.He is aware.    Current medicines are reviewed with the patient today.  The patient does not have concerns regarding medicines other than what has been noted above.  The following changes have been made:  See above.  Labs/ tests ordered today include:    Orders Placed This Encounter  Procedures  . Basic metabolic panel  . CBC     Disposition:   FU with me in 6 months.   Patient is agreeable to this plan and will call if any problems develop in the interim.   SignedTruitt Merle, NP  02/21/2017 2:13 PM  [facility] [address] [place], [place]  [address] Phone: [phone] Fax: [phone]

## 2017-02-22 LAB — BASIC METABOLIC PANEL
BUN/Creatinine Ratio: 25 — ABNORMAL HIGH (ref 10–24)
BUN: 26 mg/dL (ref 8–27)
CO2: 24 mmol/L (ref 20–29)
Calcium: 9.4 mg/dL (ref 8.6–10.2)
Chloride: 100 mmol/L (ref 96–106)
Creatinine, Ser: 1.03 mg/dL (ref 0.76–1.27)
GFR calc Af Amer: 83 mL/min/{1.73_m2} (ref >59)
GFR calc non Af Amer: 72 mL/min/{1.73_m2} (ref >59)
Glucose: 88 mg/dL (ref 65–99)
Potassium: 4.3 mmol/L (ref 3.5–5.2)
Sodium: 141 mmol/L (ref 134–144)

## 2017-02-22 LAB — CBC
Hematocrit: 42.7 % (ref 37.5–51.0)
Hemoglobin: 15.4 g/dL (ref 13.0–17.7)
MCH: 31.4 pg (ref 26.6–33.0)
MCHC: 36.1 g/dL — ABNORMAL HIGH (ref 31.5–35.7)
MCV: 87 fL (ref 79–97)
Platelets: 121 10*3/uL — ABNORMAL LOW (ref 150–379)
RBC: 4.91 x10E6/uL (ref 4.14–5.80)
RDW: 14.2 % (ref 12.3–15.4)
WBC: 6.6 10*3/uL (ref 3.4–10.8)

## 2017-02-23 ENCOUNTER — Other Ambulatory Visit: Payer: Self-pay | Admitting: *Deleted

## 2017-02-23 DIAGNOSIS — I482 Chronic atrial fibrillation, unspecified: Secondary | ICD-10-CM

## 2017-02-23 NOTE — Progress Notes (Signed)
Noted.  Will have him continue to monitor.   No change in current regimen for now.   Burtis Junes, RN, [facility] [address] [place], [place]  [address] [phone]

## 2017-02-27 ENCOUNTER — Telehealth: Payer: Self-pay | Admitting: *Deleted

## 2017-02-27 NOTE — Telephone Encounter (Signed)
S/w pt is calling in due to pt cannot get in to mychart.  Having Tishara reset pt's password and will call pt back with update.

## 2017-02-27 NOTE — Telephone Encounter (Signed)
Pt calling in to give bp readings per Lori's request, cannot figure out how to get mychart;  Oct 13 12:20 pm 136/91    4:45 pm 134/89  Oct 14 9:20 am  118/77  1:45 pm 119/87  Oct 15 10:00 am  137/82  6:00 pm 118/78  Oct 16 9:00 am 138/85  Will send to [place] to advise.

## 2017-02-27 NOTE — Telephone Encounter (Signed)
BP readings look good - no change with current regimen.

## 2017-03-20 ENCOUNTER — Encounter (INDEPENDENT_AMBULATORY_CARE_PROVIDER_SITE_OTHER): Payer: Self-pay

## 2017-03-20 NOTE — Telephone Encounter (Signed)
S/w pt is aware of Lori's recommendations.  

## 2017-05-15 HISTORY — PX: CATARACT EXTRACTION W/ INTRAOCULAR LENS  IMPLANT, BILATERAL: SHX1307

## 2017-07-13 ENCOUNTER — Telehealth: Payer: Self-pay | Admitting: Nurse Practitioner

## 2017-07-13 NOTE — Telephone Encounter (Signed)
New message    Patient calling to report blood pressures. Patient declined to give readings. Please call

## 2017-07-16 NOTE — Telephone Encounter (Signed)
S/w pt did not have bp readings.

## 2017-07-20 ENCOUNTER — Other Ambulatory Visit: Payer: Self-pay | Admitting: Cardiology

## 2017-07-23 NOTE — Telephone Encounter (Signed)
Eliquis 5mg  refill request received; pt is 72 yrs old, Wt-103.3kg, Crea-1.03 on 02/21/17 & last seen by [name], NP on 02/21/17; will send in refill to requested pharmacy.

## 2017-08-15 ENCOUNTER — Other Ambulatory Visit: Payer: Self-pay | Admitting: Internal Medicine

## 2017-08-15 DIAGNOSIS — M545 Low back pain, unspecified: Secondary | ICD-10-CM

## 2017-08-15 DIAGNOSIS — M5416 Radiculopathy, lumbar region: Secondary | ICD-10-CM

## 2017-08-19 ENCOUNTER — Ambulatory Visit
Admission: RE | Admit: 2017-08-19 | Discharge: 2017-08-19 | Disposition: A | Discharge status: Home / Self Care | Payer: Medicare Other | Source: Ambulatory Visit | Attending: Internal Medicine | Admitting: Internal Medicine

## 2017-08-19 DIAGNOSIS — M5416 Radiculopathy, lumbar region: Secondary | ICD-10-CM

## 2017-08-19 DIAGNOSIS — M545 Low back pain, unspecified: Secondary | ICD-10-CM

## 2017-08-19 MED ORDER — GADOBENATE DIMEGLUMINE 529 MG/ML IV SOLN
20.0000 mL | Freq: Once | INTRAVENOUS | Status: AC | PRN
  Administered 2017-08-19: 20 mL via INTRAVENOUS

## 2017-08-22 ENCOUNTER — Encounter: Payer: Self-pay | Admitting: Nurse Practitioner

## 2017-08-22 ENCOUNTER — Ambulatory Visit: Payer: Medicare Other | Admitting: Nurse Practitioner

## 2017-08-22 VITALS — BP 190/100 | HR 66 | Ht 72.0 in | Wt 235.8 lb

## 2017-08-22 DIAGNOSIS — I10 Essential (primary) hypertension: Secondary | ICD-10-CM

## 2017-08-22 DIAGNOSIS — Z01818 Encounter for other preprocedural examination: Secondary | ICD-10-CM | POA: Diagnosis not present

## 2017-08-22 MED ORDER — LISINOPRIL 5 MG PO TABS: 5.0000 mg | ORAL_TABLET | Freq: Every day | ORAL | 3 refills | Status: DC

## 2017-08-22 NOTE — Patient Instructions (Addendum)
We will be checking the following labs today - BMET   Medication Instructions:    Continue with your current medicines. BUT  I am adding Lisinopril 5 mg a day - this has been sent to your pharmacy    Testing/Procedures To Be Arranged:  N/A  Follow-Up:   See me in about a month    Other Special Instructions:   Keep a check on your BP for me.   Limit your salt    If you need a refill on your cardiac medications before your next appointment, please call your pharmacy.   Call the [place] office at [phone] if you have any questions, problems or concerns.

## 2017-08-22 NOTE — Progress Notes (Signed)
CARDIOLOGY OFFICE NOTE  Date:  08/22/2017    [name] Date of Birth: 06/22/1945 Medical Record #[id number]  PCP:  Leanna Battles, MD  Cardiologist:  Servando Snare     Chief Complaint  Patient presents with  . Atrial Fibrillation    Follow up visit.     History of Present Illness: LAKOTA MARKGRAF is a 72 y.o. male who presents today for a 6 month check. Former patient of Dr. Claris Gladden and Dr. Susa Simmonds. He basically follows with me.   He has chronic atrial fibrillation that has recurred after multiple cardioversions. He was previously on Coumadin and then switched over to Eliquis. He had a low risk Myoview in October of 2016. He also has OSA on CPAP, hypertension, hyperlipidemia, obesity. CT of chest 05/11/14 showed stable pulmonary nodules the largest measuring 6 mm with recommended repeat study in one year.  Last seen back in October - he was doing well. More limited by back pain and was not exercising.   Comes back today. Here with his wife - [name] - I take care of her as well and am seeing her today as well. He is now considering back surgery. MRI with worsening findings noted. Going back to see Dr. Cyndy Freeze later this month - he says he is ready to have surgery. His quality of life is not good. Spends the day sitting watching TV. He has no pain as long as he sits. He says he could walk a block or so as long as it was flat - he feels like he could still go up a flight of stairs. Very minimal DOE - he attributes to weight gain. BP is higher. He has not been checking at home. No longer on his low dose ACE - not really clear as to why. He admits he probably uses too much salt. No problems with his Eliquis. Labs from PCP noted.   Past Medical History:  Diagnosis Date  . A-fib ([place])   . Arrhythmia    hx. Atrial Fib. ,"with regurgitation"  . Depression   . Diabetes mellitus   . Heart murmur   . Hx of echocardiogram    Echo 3/16:  Mild LVH, EF 60-65%, mild MR, severe LAE, mild RAE,  PASP 32 mmHg  . Hypercholesteremia   . Hypertension   . Osteoarthritis (arthritis due to wear and tear of joints)   . Palpitations   . Prostate cancer ([place])    seed implantion- 15 to 20 yrs ago  . Sleep apnea    uses CPAP-settings 5  . Ureter, stricture    hx. 2 yrs ago-stent has been removed now    Past Surgical History:  Procedure Laterality Date  . CYSTOSCOPY W/ RETROGRADES  04/04/2012   Procedure: CYSTOSCOPY WITH RETROGRADE PYELOGRAM;  Surgeon: Molli Hazard, MD;  Location: [facility];  Service: Urology;  Laterality: Bilateral;  . CYSTOSCOPY WITH RETROGRADE PYELOGRAM, URETEROSCOPY AND STENT PLACEMENT  04/04/2012   Procedure: CYSTOSCOPY WITH RETROGRADE PYELOGRAM, URETEROSCOPY AND STENT PLACEMENT;  Surgeon: Molli Hazard, MD;  Location: [facility];  Service: Urology;  Laterality: Right;  . seed implants    . TOTAL KNEE ARTHROPLASTY Left 08/06/2014   Procedure: LEFT TOTAL KNEE ARTHROPLASTY;  Surgeon: Susa Day, MD;  Location: [facility];  Service: Orthopedics;  Laterality: Left;     Medications: Current Meds  Medication Sig  . allopurinol (ZYLOPRIM) 300 MG tablet Take 300 mg by mouth daily.  Marland Kitchen atenolol-chlorthalidone (TENORETIC) 50-25 MG per tablet  Take 1 tablet by mouth daily.  Marland Kitchen atorvastatin (LIPITOR) 10 MG tablet Take 10 mg by mouth every evening.   . celecoxib (CELEBREX) 200 MG capsule Take 200 mg by mouth daily.   Marland Kitchen ELIQUIS 5 MG TABS tablet TAKE 1 TABLET BY MOUTH TWO  TIMES DAILY  . metFORMIN (GLUCOPHAGE) 1000 MG tablet Take 500 mg by mouth daily with breakfast.  . Multiple Vitamin (MULTIVITAMIN PO) Take 1 tablet by mouth daily.   . Multiple Vitamins-Minerals (PRESERVISION AREDS 2) CAPS Take 1 capsule by mouth daily.  . traMADol (ULTRAM) 50 MG tablet Take 50 mg by mouth daily. arthritis     Allergies: Allergies  Allergen Reactions  . Doxycycline Itching  . Ioxaglate Rash  . Sulfa Drugs Cross Reactors Other (See Comments)  . Tetracyclines & Related Other (See  Comments)  . Contrast Media [Iodinated Diagnostic Agents] Rash    Social History: The patient  reports that he quit smoking about 44 years ago. He has never used smokeless tobacco. He reports that he drinks alcohol. He reports that he does not use drugs.   Family History: The patient's family history includes Cancer in his mother; Dementia in his mother; Heart attack in his father and sister; Stroke in his sister.   Review of Systems: Please see the history of present illness.   Otherwise, the review of systems is positive for none.   All other systems are reviewed and negative.   Physical Exam: VS:  BP (!) 190/100 (BP Location: Left Arm, Patient Position: Sitting, Cuff Size: Normal)   Pulse 66   Ht 6' (1.829 m)   Wt 235 lb 12.8 oz (107 kg)   BMI 31.98 kg/m  .  BMI Body mass index is 31.98 kg/m.  Wt Readings from Last 3 Encounters:  08/22/17 235 lb 12.8 oz (107 kg)  02/21/17 227 lb 12.8 oz (103.3 kg)  08/02/16 233 lb (105.7 kg)    General: Pleasant. Well developed, well nourished and in no acute distress.   HEENT: Normal.  Neck: Supple, no JVD, carotid bruits, or masses noted.  Cardiac: Irregular irregular rhythm. Rate is fine. No murmurs, rubs, or gallops. No edema.  Respiratory:  Lungs are clear to auscultation bilaterally with normal work of breathing.  GI: Soft and nontender.  MS: No deformity or atrophy. Gait and ROM intact.  Skin: Warm and dry. Color is normal.  Neuro:  Strength and sensation are intact and no gross focal deficits noted.  Psych: Alert, appropriate and with normal affect.   LABORATORY DATA:  EKG:  EKG is ordered today. This shows AF with controlled VR. Septal Q's - unchanged.   Lab Results  Component Value Date   WBC 6.6 02/21/2017   HGB 15.4 02/21/2017   HCT 42.7 02/21/2017   PLT 121 (L) 02/21/2017   GLUCOSE 88 02/21/2017   ALT 22 09/21/2013   AST 25 09/21/2013   NA 141 02/21/2017   K 4.3 02/21/2017   CL 100 02/21/2017   CREATININE 1.03  02/21/2017   BUN 26 02/21/2017   CO2 24 02/21/2017   INR 1.41 08/10/2014       BNP (last 3 results) No results for input(s): BNP in the last 8760 hours.  ProBNP (last 3 results) No results for input(s): PROBNP in the last 8760 hours.   Other Studies Reviewed Today:  CT CHEST IMPRESSION 07/2015: 1. Multiple small solid pulmonary nodules are unchanged from baseline examination of November 2014, consistent with benign findings. 2. There is a  single ground-glass nodule in the right middle lobe which is also unchanged and likely benign. Current guidelines call for longer follow-up of such nodules until 5 years of stability has been established. Therefore, consider 1 additional examination approximately 2 years now. This recommendation follows the consensus statement: Guidelines for Management of Incidental Pulmonary Nodules Detected on CT Images:From the Fleischner Society 2017; published online before print (10.1148/radiol.0626948546). 3. Coronary artery atherosclerosis. 4. Hepatic steatosis and cholelithiasis.   Electronically Signed By: Richardean Sale M.D. On: 08/04/2015 14:26   Echo Study Conclusions from 07/2014  - Left ventricle: The cavity size was normal. Wall thickness was increased in a pattern of mild LVH. Systolic function was normal. The estimated ejection fraction was in the range of 60% to 65%. - Mitral valve: There was mild regurgitation. - Left atrium: The atrium was severely dilated. - Right atrium: The atrium was mildly dilated. - Pulmonary arteries: PA peak pressure: 32 mm Hg (S).      Myoview Study Highlights from 02/2015    Defect 1: There is a small defect of mild severity present in the basal inferior and basal inferolateral location.  There was no ST segment deviation noted during stress.  Thinning in the inferior / inferolateral region (base) Otherwise normal perfusion. May represent soft tissue attenuation vs small  region of scar Images not gated.  Overall low risk scan.      ASSESSMENT AND PLAN:  1. Possible pre op clearance - may be looking at back surgery in the future. Quality of life currently not acceptable to him. He has no current symptoms other than uncontrolled HTN. His activity level is probably right at 4 mets. Last Myoview stable. Should be an acceptable candidate if surgery is warranted - will have increased risk of stroke with stopping Eliquis as well as bleeding potential post op.   2. Multiple CV risk factors- low risk Myoview from October of 2016noted. He is not having chest pain. He has become more sedentary due to back pain.   3. Chronic Atrial fibrillation Patient has chronic atrial fibrillation and now on Eliquis. His rate is controlled -  On beta blocker therapy.   4. HLD- on statin therapy - lipids followed by PCP - noted lipids from December off [place]  5. HTN- BP quite high here today - recheck by me is 160/80. Adding back ACE today - he is also diabetic. BMET today. He is asked to monitor at home. I will see him back for a recheck prior to possible surgery.   6. Lung nodule- needs updated -  We did not discuss today - will address on return visit.    Current medicines are reviewed with the patient today.  The patient does not have concerns regarding medicines other than what has been noted above.  The following changes have been made:  See above.  Labs/ tests ordered today include:    Orders Placed This Encounter  Procedures  . Basic metabolic panel  . EKG 12-Lead     Disposition:   FU with me in about a month. Will plan to recheck BMET on return.   Patient is agreeable to this plan and will call if any problems develop in the interim.   SignedTruitt Merle, NP  08/22/2017 2:35 PM  [facility] [address] [place], [place]  [address] Phone: [phone] Fax: [phone]

## 2017-08-23 LAB — BASIC METABOLIC PANEL
BUN/Creatinine Ratio: 21 (ref 10–24)
BUN: 23 mg/dL (ref 8–27)
CO2: 25 mmol/L (ref 20–29)
Calcium: 9.4 mg/dL (ref 8.6–10.2)
Chloride: 103 mmol/L (ref 96–106)
Creatinine, Ser: 1.12 mg/dL (ref 0.76–1.27)
GFR calc Af Amer: 75 mL/min/{1.73_m2} (ref >59)
GFR calc non Af Amer: 65 mL/min/{1.73_m2} (ref >59)
Glucose: 84 mg/dL (ref 65–99)
Potassium: 3.9 mmol/L (ref 3.5–5.2)
Sodium: 143 mmol/L (ref 134–144)

## 2017-09-05 ENCOUNTER — Other Ambulatory Visit: Payer: Self-pay | Admitting: *Deleted

## 2017-09-05 ENCOUNTER — Other Ambulatory Visit: Payer: Self-pay | Admitting: Neurosurgery

## 2017-09-05 ENCOUNTER — Telehealth: Payer: Self-pay

## 2017-09-05 ENCOUNTER — Telehealth: Payer: Self-pay | Admitting: *Deleted

## 2017-09-05 MED ORDER — LISINOPRIL 5 MG PO TABS: 5.0000 mg | ORAL_TABLET | Freq: Two times a day (BID) | ORAL | 3 refills | Status: DC

## 2017-09-05 NOTE — Telephone Encounter (Signed)
S/w pt is aware appt was moved due to upcoming surgery, [name] wanted to see pt prior to surgery.

## 2017-09-05 NOTE — Telephone Encounter (Signed)
   [facility] Pre-operative Risk Assessment    Request for surgical clearance:  1. What type of surgery is being performed? Lumbar Laminectomy   2. When is this surgery scheduled? 09/27/17   3. What type of clearance is required (medical clearance vs. Pharmacy clearance to hold med vs. Both)?  Both  4. Are there any medications that need to be held prior to surgery and how long? Eliquis   5. Practice name and name of physician performing surgery? [facility]  Dr Christella Noa   6. What is your office phone number [phone] ext. [phone]    7.   What is your office fax number [phone]  8.   Anesthesia type (None, local, MAC, general) ? Dirk Dress  Caitlen Worth 09/05/2017, 2:13 PM  _________________________________________________________________   (provider comments below)

## 2017-09-05 NOTE — Telephone Encounter (Signed)
Pt called in today to give bp readings: 4/11 2 pm  123/82 4/12  7:30 am  141/95 4/12 1 pm  116/84 4/13 9 am  147/94 4/15 12 pm  138/87 4/17  9 am   149/91 4/18  7:30 am  158/100 4/18  12 pm  125/81 4/19  6 pm  157/101 4/19  7 pm  132/83 4/20 12 pm 116/82 4/20 12pm  122/78 4/21  10 am  155/92 4/21  1:30 pm  128/88 4/22  8 am  153/98 4/22  12 pm  123/86 4/24  7:30 am  143/97  Will send to [name] to review.

## 2017-09-05 NOTE — Telephone Encounter (Signed)
BP readings noted - let's increase the Lisinopril to 5 mg twice a day  Continue to monitor. See back as planned. Will need BMET on return.  Burtis Junes, RN, [place] [address] [place], [place]  [address] [phone]

## 2017-09-05 NOTE — Telephone Encounter (Signed)
Pt has an office visit scheduled for 5/13 for pre op clearance.  Kerin Ransom PA-C 09/05/2017 4:36 PM

## 2017-09-05 NOTE — Telephone Encounter (Signed)
S/w pt is aware of Lori's recommendation's.  Will increase lisinopril (5 mg ) bid, bmet added to pt's appt notes.  Medication list updated. Pt's upcoming surgery is May 16.  Will send to [place] to [place].

## 2017-09-05 NOTE — Telephone Encounter (Signed)
I was hoping to see him back prior to his surgery. Can we do this?  [name]

## 2017-09-06 ENCOUNTER — Encounter: Payer: Self-pay | Admitting: Nurse Practitioner

## 2017-09-17 NOTE — Progress Notes (Addendum)
PCP: Bevelyn Buckles, MD  Cardiologist: Kerin Ransom, MD  EKG: 08/31/17 in EPIC  Stress test: 02/19/2015 in EPIC  ECHO: 07/24/2014 in EPIC  Cardiac Cath: pt denies ever  Chest x-ray: pt denies past year, no recent respiratory infections/complications  Patient is aware to begin holding eliquis 3 days prior to procedure

## 2017-09-17 NOTE — Pre-Procedure Instructions (Signed)
[name]  09/17/2017      [id number] - [place], [place] - [address] [place] [address] Phone: [phone] Fax: [phone]  [place], [facility] [address] [place] [address] [place] [address] Phone: [phone] Fax: [phone]    Your procedure is scheduled on May 16  Report to [place] at 1045 A.M.  Call this number if you have problems the morning of surgery:  [phone]   Remember:  Do not eat food or drink liquids after midnight.  Continue all medications as directed by your physician except follow these medication instructions before surgery below   Take these medicines the morning of surgery with A SIP OF WATER  allopurinol (ZYLOPRIM) atenolol-chlorthalidone (TENORETIC) traMADol (ULTRAM) if needed for pain  7 days prior to surgery STOP taking any Aspirin(unless otherwise instructed by your surgeon), Aleve, Naproxen, Ibuprofen, Motrin, Advil, Goody's, BC's, all herbal medications, fish oil, and all vitamins, celecoxib (CELEBREX)    WHAT DO I DO ABOUT MY DIABETES MEDICATION?   Marland Kitchen Do not take oral diabetes medicines (pills) the morning of surgery.  metFORMIN (GLUCOPHAGE)   How to Manage Your Diabetes Before and After Surgery  Why is it important to control my blood sugar before and after surgery? . Improving blood sugar levels before and after surgery helps healing and can limit problems. . A way of improving blood sugar control is eating a healthy diet by: o  Eating less sugar and carbohydrates o  Increasing activity/exercise o  Talking with your doctor about reaching your blood sugar goals . High blood sugars (greater than 180 mg/dL) can raise your risk of infections and slow your recovery, so you will need to focus on controlling your diabetes during the weeks before surgery. . Make sure that the doctor who  takes care of your diabetes knows about your planned surgery including the date and location.  How do I manage my blood sugar before surgery? . Check your blood sugar at least 4 times a day, starting 2 days before surgery, to make sure that the level is not too high or low. o Check your blood sugar the morning of your surgery when you wake up and every 2 hours until you get to the Short Stay unit. . If your blood sugar is less than 70 mg/dL, you will need to treat for low blood sugar: o Do not take insulin. o Treat a low blood sugar (less than 70 mg/dL) with  cup of clear juice (cranberry or apple), 4 glucose tablets, OR glucose gel. o Recheck blood sugar in 15 minutes after treatment (to make sure it is greater than 70 mg/dL). If your blood sugar is not greater than 70 mg/dL on recheck, call [phone] for further instructions. . Report your blood sugar to the short stay nurse when you get to Short Stay.  . If you are admitted to the hospital after surgery: o Your blood sugar will be checked by the staff and you will probably be given insulin after surgery (instead of oral diabetes medicines) to make sure you have good blood sugar levels. o The goal for blood sugar control after surgery is 80-180 mg/dL   Do not wear jewelry, make-up or nail polish.  Do not wear lotions, powders, or perfumes, or deodorant.  Do not shave 48 hours prior to surgery.  Do not bring valuables to the hospital.  [facility] is not responsible for any belongings or valuables.  Contacts, dentures or bridgework may not be worn into surgery.  Leave your suitcase in the car.  After surgery it may be brought to your room.  For patients admitted to the hospital, discharge time will be determined by your treatment team.  Patients discharged the day of surgery will not be allowed to drive home.    Special instructions:   [name]- Preparing For Surgery  Before surgery, you can play an important role. Because  skin is not sterile, your skin needs to be as free of germs as possible. You can reduce the number of germs on your skin by washing with CHG (chlorahexidine gluconate) Soap before surgery.  CHG is an antiseptic cleaner which kills germs and bonds with the skin to continue killing germs even after washing.  Please do not use if you have an allergy to CHG or antibacterial soaps. If your skin becomes reddened/irritated stop using the CHG.  Do not shave (including legs and underarms) for at least 48 hours prior to first CHG shower. It is OK to shave your face.  Please follow these instructions carefully.   1. Shower the NIGHT BEFORE SURGERY and the MORNING OF SURGERY with CHG.   2. If you chose to wash your hair, wash your hair first as usual with your normal shampoo.  3. After you shampoo, rinse your hair and body thoroughly to remove the shampoo.  4. Use CHG as you would any other liquid soap. You can apply CHG directly to the skin and wash gently with a scrungie or a clean washcloth.   5. Apply the CHG Soap to your body ONLY FROM THE NECK DOWN.  Do not use on open wounds or open sores. Avoid contact with your eyes, ears, mouth and genitals (private parts). Wash Face and genitals (private parts)  with your normal soap.  6. Wash thoroughly, paying special attention to the area where your surgery will be performed.  7. Thoroughly rinse your body with warm water from the neck down.  8. DO NOT shower/wash with your normal soap after using and rinsing off the CHG Soap.  9. Pat yourself dry with a CLEAN TOWEL.  10. Wear CLEAN PAJAMAS to bed the night before surgery, wear comfortable clothes the morning of surgery  11. Place CLEAN SHEETS on your bed the night of your first shower and DO NOT SLEEP WITH PETS.    Day of Surgery: Do not apply any deodorants/lotions. Please wear clean clothes to the hospital/surgery center.      Please read over the following fact sheets that you were  given.

## 2017-09-18 ENCOUNTER — Encounter (HOSPITAL_COMMUNITY)
Admission: RE | Admit: 2017-09-18 | Discharge: 2017-09-18 | Disposition: A | Discharge status: Home / Self Care | Payer: Medicare Other | Source: Ambulatory Visit | Attending: Neurosurgery | Admitting: Neurosurgery

## 2017-09-18 ENCOUNTER — Encounter (HOSPITAL_COMMUNITY): Payer: Self-pay

## 2017-09-18 ENCOUNTER — Other Ambulatory Visit: Payer: Self-pay

## 2017-09-18 DIAGNOSIS — Z7901 Long term (current) use of anticoagulants: Secondary | ICD-10-CM | POA: Insufficient documentation

## 2017-09-18 DIAGNOSIS — M48062 Spinal stenosis, lumbar region with neurogenic claudication: Secondary | ICD-10-CM | POA: Diagnosis not present

## 2017-09-18 DIAGNOSIS — Z87891 Personal history of nicotine dependence: Secondary | ICD-10-CM | POA: Diagnosis not present

## 2017-09-18 DIAGNOSIS — Z01818 Encounter for other preprocedural examination: Secondary | ICD-10-CM | POA: Insufficient documentation

## 2017-09-18 DIAGNOSIS — I4891 Unspecified atrial fibrillation: Secondary | ICD-10-CM | POA: Insufficient documentation

## 2017-09-18 DIAGNOSIS — E7800 Pure hypercholesterolemia, unspecified: Secondary | ICD-10-CM | POA: Diagnosis not present

## 2017-09-18 DIAGNOSIS — F329 Major depressive disorder, single episode, unspecified: Secondary | ICD-10-CM | POA: Diagnosis not present

## 2017-09-18 DIAGNOSIS — I10 Essential (primary) hypertension: Secondary | ICD-10-CM | POA: Insufficient documentation

## 2017-09-18 DIAGNOSIS — Z7984 Long term (current) use of oral hypoglycemic drugs: Secondary | ICD-10-CM | POA: Diagnosis not present

## 2017-09-18 DIAGNOSIS — E119 Type 2 diabetes mellitus without complications: Secondary | ICD-10-CM | POA: Diagnosis not present

## 2017-09-18 DIAGNOSIS — Z96652 Presence of left artificial knee joint: Secondary | ICD-10-CM | POA: Diagnosis not present

## 2017-09-18 DIAGNOSIS — Z79899 Other long term (current) drug therapy: Secondary | ICD-10-CM | POA: Insufficient documentation

## 2017-09-18 HISTORY — DX: Cardiac arrhythmia, unspecified: I49.9

## 2017-09-18 LAB — CBC
HCT: 44.9 % (ref 39.0–52.0)
Hemoglobin: 15.7 g/dL (ref 13.0–17.0)
MCH: 32.1 pg (ref 26.0–34.0)
MCHC: 35 g/dL (ref 30.0–36.0)
MCV: 91.8 fL (ref 78.0–100.0)
Platelets: 120 10*3/uL — ABNORMAL LOW (ref 150–400)
RBC: 4.89 MIL/uL (ref 4.22–5.81)
RDW: 13.1 % (ref 11.5–15.5)
WBC: 5.1 10*3/uL (ref 4.0–10.5)

## 2017-09-18 LAB — BASIC METABOLIC PANEL
Anion gap: 8 (ref 5–15)
BUN: 22 mg/dL — ABNORMAL HIGH (ref 6–20)
CO2: 27 mmol/L (ref 22–32)
Calcium: 9 mg/dL (ref 8.9–10.3)
Chloride: 105 mmol/L (ref 101–111)
Creatinine, Ser: 1.34 mg/dL — ABNORMAL HIGH (ref 0.61–1.24)
GFR calc Af Amer: 59 mL/min — ABNORMAL LOW (ref >60)
GFR calc non Af Amer: 51 mL/min — ABNORMAL LOW (ref >60)
Glucose, Bld: 106 mg/dL — ABNORMAL HIGH (ref 65–99)
Potassium: 4.2 mmol/L (ref 3.5–5.1)
Sodium: 140 mmol/L (ref 135–145)

## 2017-09-18 LAB — PROTIME-INR
INR: 1.31
Prothrombin Time: 16.2 seconds — ABNORMAL HIGH (ref 11.4–15.2)

## 2017-09-18 LAB — SURGICAL PCR SCREEN
MRSA, PCR: NEGATIVE
Staphylococcus aureus: NEGATIVE

## 2017-09-18 LAB — GLUCOSE, CAPILLARY: Glucose-Capillary: 131 mg/dL — ABNORMAL HIGH (ref 65–99)

## 2017-09-18 LAB — HEMOGLOBIN A1C
Hgb A1c MFr Bld: 5.1 % (ref 4.8–5.6)
Mean Plasma Glucose: 99.67 mg/dL

## 2017-09-18 NOTE — Progress Notes (Signed)
[name]            09/17/2017                          [id number] - [place], [place] - [address] [place] [address] Phone: [phone] Fax: [phone]  [place], [place] [facility] [place] [address] [place] [address] [place] [address] Phone: [phone] Fax: [phone]              Your procedure is scheduled on May 16            Report to [facility] at 1045 A.M.            Call this number if you have problems the morning of surgery:            [phone]             Remember:            Do not eat food or drink liquids after midnight.  Continue all medications as directed by your physician except follow these medication instructions before surgery below             Take these medicines the morning of surgery with A SIP OF WATER  allopurinol (ZYLOPRIM) atenolol-chlorthalidone (TENORETIC) traMADol (ULTRAM) if needed for pain  Follow your doctor's instructions on when to begin holding Eliquis (3 days prior to procedure per his instructions)  7 days prior to surgery STOP taking any Aspirin(unless otherwise instructed by your surgeon), Aleve, Naproxen, Ibuprofen, Motrin, Advil, Goody's, BC's, all herbal medications, fish oil, and all vitamins, celecoxib (CELEBREX)   WHAT DO I DO ABOUT MY DIABETES MEDICATION?   Do not take oral diabetes medicines (pills) the morning of surgery.  metFORMIN (GLUCOPHAGE)   HOW TO MANAGE YOUR DIABETES BEFORE AND AFTER SURGERY  Why is it important to control my blood sugar before and after surgery?  Improving blood sugar levels before and after surgery helps healing and can limit problems.  A way of improving blood sugar control is eating a healthy diet by: ?  Eating less sugar and carbohydrates ?  Increasing activity/exercise ?  Talking with your doctor about reaching your blood sugar  goals  High blood sugars (greater than 180 mg/dL) can raise your risk of infections and slow your recovery, so you will need to focus on controlling your diabetes during the weeks before surgery.  Make sure that the doctor who takes care of your diabetes knows about your planned surgery including the date and location.  How do I manage my blood sugar before surgery?  Check your blood sugar at least 4 times a day, starting 2 days before surgery, to make sure that the level is not too high or low. ? Check your blood sugar the morning of your surgery when you wake up and every 2 hours until you get to the Short Stay unit.  If your blood sugar is less than 70 mg/dL, you will need to treat for low blood sugar: ? Do not take insulin. ? Treat a low blood sugar (less than 70 mg/dL) with  cup of clear juice (cranberry or apple), 4glucose tablets, OR glucose gel. ? Recheck blood sugar in 15 minutes after treatment (to make sure it is greater than 70 mg/dL). If your blood sugar is not  greater than 70 mg/dL on recheck, call [phone] for further instructions.  Report your blood sugar to the short stay nurse when you get to Short Stay.   If you are admitted to the hospital after surgery: ? Your blood sugar will be checked by the staff and you will probably be given insulin after surgery (instead of oral diabetes medicines) to make sure you have good blood sugar levels. ? The goal for blood sugar control after surgery is 80-180 mg/dL      Contacts, dentures or bridgework may not be worn into surgery.  Leave your suitcase in the car.  After surgery it may be brought to your room.  For patients admitted to the hospital, discharge time will be determined by your treatment team.  Patients discharged the day of surgery will not be allowed to drive home.    Special instructions:   [facility]- Preparing For Surgery  Before surgery, you can play an important role. Because skin is not  sterile, your skin needs to be as free of germs as possible. You can reduce the number of germs on your skin by washing with CHG (chlorahexidine gluconate) Soap before surgery.  CHG is an antiseptic cleaner which kills germs and bonds with the skin to continue killing germs even after washing.  Please do not use if you have an allergy to CHG or antibacterial soaps. If your skin becomes reddened/irritated stop using the CHG.  Do not shave (including legs and underarms) for at least 48 hours prior to first CHG shower. It is OK to shave your face.  Please follow these instructions carefully.                                                                                                                     1. Shower the NIGHT BEFORE SURGERY and the MORNING OF SURGERY with CHG.   2. If you chose to wash your hair, wash your hair first as usual with your normal shampoo.  3. After you shampoo, rinse your hair and body thoroughly to remove the shampoo.  4. Use CHG as you would any other liquid soap. You can apply CHG directly to the skin and wash gently with a scrungie or a clean washcloth.   5. Apply the CHG Soap to your body ONLY FROM THE NECK DOWN.  Do not use on open wounds or open sores. Avoid contact with your eyes, ears, mouth and genitals (private parts). Wash Face and genitals (private parts)  with your normal soap.  6. Wash thoroughly, paying special attention to the area where your surgery will be performed.  7. Thoroughly rinse your body with warm water from the neck down.  8. DO NOT shower/wash with your normal soap after using and rinsing off the CHG Soap.  9. Pat yourself dry with a CLEAN TOWEL.  10. Wear CLEAN PAJAMAS to bed the night before surgery, wear comfortable clothes the morning of surgery  11. Place CLEAN SHEETS on your bed the  night of your first shower and DO NOT SLEEP WITH PETS.   Day of Surgery: Do not apply any deodorants/lotions. Please wear clean  clothes to the hospital/surgery center.               Do not wear jewelry, make-up or nail polish.            Do not wear lotions, powders, or perfumes, or deodorant.            Do not shave 48 hours prior to surgery.              Do not bring valuables to the hospital.            [facility] is not responsible for any belongings or valuables.  Please read over the following fact sheets that you were given.

## 2017-09-19 NOTE — Progress Notes (Addendum)
Anesthesia Chart Review:   Case:  [id number] Date/Time:  09/27/17 1230   Procedure:  LAMINECTOMY LUMBAR 3- LUMBAR 4, LUMBAR 4- LUMBAR 5 (N/A ) - LAMINECTOMY LUMBAR 3- LUMBAR 4, LUMBAR 4- LUMBAR 5   Anesthesia type:  General   Pre-op diagnosis:  LUMBAR STENOSIS WITH NEUROGENIC CLAUDICATION   Location:  [facility] OR ROOM 19 / [facility]   Surgeon:  Ashok Pall, MD     Addendum:   Pt saw Truitt Merle, NP with cardiology 09/24/17 an cleared for surgery at acceptable risk.    DISCUSSION:  - Pt is a 72 year old male with hx chronic afib.  - PT 16.2; last dose eliquis 09/24/17.  Will recheck PT day of surgery - Has cardiology appt 09/24/17 with Truitt Merle.  Will revisit chart after this visit.    VS: BP 130/82   Pulse 65   Temp 36.7 C (Oral)   Resp 20   Ht 6' (1.829 m)   Wt 243 lb 3.2 oz (110.3 kg)   SpO2 99%   BMI 32.98 kg/m    PROVIDERS: Leanna Battles, MD Truitt Merle, NP with cardiology sees pt for afib. Last office visit 08/22/17; has f/u appt 09/24/17.    LABS:  - PT 16.2; last dose eliquis 09/24/17.  Will recheck PT day of surgery  (all labs ordered are listed, but only abnormal results are displayed)  Labs Reviewed  GLUCOSE, CAPILLARY - Abnormal; Notable for the following components:      Result Value   Glucose-Capillary 131 (*)    All other components within normal limits  BASIC METABOLIC PANEL - Abnormal; Notable for the following components:   Glucose, Bld 106 (*)    BUN 22 (*)    Creatinine, Ser 1.34 (*)    GFR calc non Af Amer 51 (*)    GFR calc Af Amer 59 (*)    All other components within normal limits  CBC - Abnormal; Notable for the following components:   Platelets 120 (*)    All other components within normal limits  PROTIME-INR - Abnormal; Notable for the following components:   Prothrombin Time 16.2 (*)    All other components within normal limits  SURGICAL PCR SCREEN  HEMOGLOBIN A1C    EKG 08/22/17: Atrial fibrillation (66 bpm).  Septal infarct, age  undetermined.   CV:  Nuclear stress test 02/19/15:   Defect 1: There is a small defect of mild severity present in the basal inferior and basal inferolateral location.  There was no ST segment deviation noted during stress. - Thinning in the inferior / inferolateral region (base - Otherwise normal perfusion.  May represent soft tissue attenuation vs small region of scar  Images not gated.   - Overall low risk scan.  Echo 07/24/14:  - Left ventricle: The cavity size was normal. Wall thickness wasincreased in a pattern of mild LVH. Systolic function was normal.The estimated ejection fraction was in the range of 60% to 65%. - Mitral valve: There was mild regurgitation. - Left atrium: The atrium was severely dilated. - Right atrium: The atrium was mildly dilated. - Pulmonary arteries: PA peak pressure: 32 mm Hg (S).   Past Medical History:  Diagnosis Date  . A-fib ([place])   . Arrhythmia    hx. Atrial Fib. ,"with regurgitation"  . Depression   . Diabetes mellitus   . Dysrhythmia    a. fib-on Eliquis  . Heart murmur   . Hx of echocardiogram    Echo 3/16:  Mild LVH, EF 60-65%, mild MR, severe LAE, mild RAE, PASP 32 mmHg  . Hypercholesteremia   . Hypertension   . Osteoarthritis (arthritis due to wear and tear of joints)   . Palpitations   . Prostate cancer ([place])    seed implantion- 15 to 20 yrs ago  . Sleep apnea    uses CPAP-settings 5  . Ureter, stricture    hx. 2 yrs ago-stent has been removed now    Past Surgical History:  Procedure Laterality Date  . CYSTOSCOPY W/ RETROGRADES  04/04/2012   Procedure: CYSTOSCOPY WITH RETROGRADE PYELOGRAM;  Surgeon: Molli Hazard, MD;  Location: [facility];  Service: Urology;  Laterality: Bilateral;  . CYSTOSCOPY WITH RETROGRADE PYELOGRAM, URETEROSCOPY AND STENT PLACEMENT  04/04/2012   Procedure: CYSTOSCOPY WITH RETROGRADE PYELOGRAM, URETEROSCOPY AND STENT PLACEMENT;  Surgeon: Molli Hazard, MD;  Location: [facility];  Service:  Urology;  Laterality: Right;  . JOINT REPLACEMENT Left   . ROTATOR CUFF REPAIR Left   . seed implants    . TOTAL KNEE ARTHROPLASTY Left 08/06/2014   Procedure: LEFT TOTAL KNEE ARTHROPLASTY;  Surgeon: Susa Day, MD;  Location: [facility];  Service: Orthopedics;  Laterality: Left;    MEDICATIONS: . allopurinol (ZYLOPRIM) 300 MG tablet  . atenolol-chlorthalidone (TENORETIC) 50-25 MG per tablet  . atorvastatin (LIPITOR) 10 MG tablet  . celecoxib (CELEBREX) 200 MG capsule  . ELIQUIS 5 MG TABS tablet  . lisinopril (PRINIVIL,ZESTRIL) 5 MG tablet  . metFORMIN (GLUCOPHAGE) 1000 MG tablet  . Multiple Vitamin (MULTIVITAMIN WITH MINERALS) TABS tablet  . Multiple Vitamins-Minerals (PRESERVISION AREDS 2) CAPS  . traMADol (ULTRAM) 50 MG tablet   No current facility-administered medications for this encounter.    - Last dose eliquis 09/24/17   If no changes, I anticipate pt can proceed with surgery as scheduled.   Willeen Cass, FNP-BC [facility]/Anesthesiology Phone: [phone] 09/26/2017 9:12 AM

## 2017-09-24 ENCOUNTER — Encounter: Payer: Self-pay | Admitting: Nurse Practitioner

## 2017-09-24 ENCOUNTER — Ambulatory Visit (INDEPENDENT_AMBULATORY_CARE_PROVIDER_SITE_OTHER): Payer: Medicare Other | Admitting: Nurse Practitioner

## 2017-09-24 VITALS — BP 156/100 | HR 72 | Ht 72.0 in | Wt 234.4 lb

## 2017-09-24 DIAGNOSIS — I482 Chronic atrial fibrillation, unspecified: Secondary | ICD-10-CM

## 2017-09-24 DIAGNOSIS — Z01818 Encounter for other preprocedural examination: Secondary | ICD-10-CM

## 2017-09-24 DIAGNOSIS — I10 Essential (primary) hypertension: Secondary | ICD-10-CM | POA: Diagnosis not present

## 2017-09-24 MED ORDER — AMLODIPINE BESYLATE 5 MG PO TABS: 5.0000 mg | ORAL_TABLET | Freq: Every day | ORAL | 3 refills | Status: DC

## 2017-09-24 NOTE — Progress Notes (Signed)
CARDIOLOGY OFFICE NOTE  Date:  09/24/2017    [name] Date of Birth: 1945/09/03 Medical Record #[id number]  PCP:  Leanna Battles, MD  Cardiologist:  Servando Snare     Chief Complaint  Patient presents with  . Hypertension  . Pre-op Exam    Follow up visit     History of Present Illness: [name] is a 72 y.o. male who presents today for a follow up visit. Former patient of Dr. Claris Gladden and Dr. Susa Simmonds.He basically follows with me.  He has chronic atrial fibrillation that has recurred after multiple cardioversions. Hewas previously onCoumadinand then switched over to Eliquis. He had a low risk Myoview inOctoberof 2016. He also has OSA on CPAP, hypertension, hyperlipidemia, obesity. CT of chest 05/11/14 showed stable pulmonary nodules the largest measuring 6 mm with recommended repeat study in one year.  I saw him back in October - he was doing well. More limited by back pain and was not exercising.   I saw him last month - his regular visit - was probably going to need back surgery. BP was up - ACE restarted. He was otherwise doing well.   Comes back today. Here with his wife - [name] - I take care of her as well.  He is pretty miserable. Surgery is this Thursday. He has been taken off his Celebrex - can hardly move now. BP remains elevated. Up to 170/100 at home. His cuff correlates pretty well. No chest pain. Not short of breath. No syncope. He took his last dose of Eliquis as of last night.   According to the Revised Cardiac Risk Index (RCRI), his Perioperative Risk of Major Cardiac Event is (%): 0.9  His Functional Capacity in METs is: 4.61 according to the [employer] Activity Status Index (DASI).   Past Medical History:  Diagnosis Date  . A-fib ([place])   . Arrhythmia    hx. Atrial Fib. ,"with regurgitation"  . Depression   . Diabetes mellitus   . Dysrhythmia    a. fib-on Eliquis  . Heart murmur   . Hx of echocardiogram    Echo 3/16:  Mild LVH, EF 60-65%,  mild MR, severe LAE, mild RAE, PASP 32 mmHg  . Hypercholesteremia   . Hypertension   . Osteoarthritis (arthritis due to wear and tear of joints)   . Palpitations   . Prostate cancer ([place])    seed implantion- 15 to 20 yrs ago  . Sleep apnea    uses CPAP-settings 5  . Ureter, stricture    hx. 2 yrs ago-stent has been removed now    Past Surgical History:  Procedure Laterality Date  . CYSTOSCOPY W/ RETROGRADES  04/04/2012   Procedure: CYSTOSCOPY WITH RETROGRADE PYELOGRAM;  Surgeon: Molli Hazard, MD;  [facility];  Service: Urology;  Laterality: Bilateral;  . CYSTOSCOPY WITH RETROGRADE PYELOGRAM, URETEROSCOPY AND STENT PLACEMENT  04/04/2012   Procedure: CYSTOSCOPY WITH RETROGRADE PYELOGRAM, URETEROSCOPY AND STENT PLACEMENT;  Surgeon: Molli Hazard, MD;  [facility];  Service: Urology;  Laterality: Right;  . JOINT REPLACEMENT Left   . ROTATOR CUFF REPAIR Left   . seed implants    . TOTAL KNEE ARTHROPLASTY Left 08/06/2014   Procedure: LEFT TOTAL KNEE ARTHROPLASTY;  Surgeon: Susa Day, MD;  Location: [facility];  Service: Orthopedics;  Laterality: Left;     Medications: Current Meds  Medication Sig  . allopurinol (ZYLOPRIM) 300 MG tablet Take 300 mg by mouth daily.  Marland Kitchen atenolol-chlorthalidone (TENORETIC) 50-25  MG per tablet Take 0.5 tablets by mouth daily.   Marland Kitchen atorvastatin (LIPITOR) 10 MG tablet Take 10 mg by mouth every evening.   . celecoxib (CELEBREX) 200 MG capsule Take 200 mg by mouth 2 (two) times daily.   Marland Kitchen ELIQUIS 5 MG TABS tablet TAKE 1 TABLET BY MOUTH TWO  TIMES DAILY  . lisinopril (PRINIVIL,ZESTRIL) 5 MG tablet Take 1 tablet (5 mg total) by mouth 2 (two) times daily.  . metFORMIN (GLUCOPHAGE) 1000 MG tablet Take 500 mg by mouth daily with breakfast.  . Multiple Vitamin (MULTIVITAMIN WITH MINERALS) TABS tablet Take 1 tablet by mouth daily. Centrum Silver  . Multiple Vitamins-Minerals (PRESERVISION AREDS 2) CAPS Take 1 capsule by mouth 2 (two) times  daily.   . traMADol (ULTRAM) 50 MG tablet Take 50 mg by mouth 2 (two) times daily. arthritis      Allergies: Allergies  Allergen Reactions  . Doxycycline Itching  . Ioxaglate Rash  . Sulfa Drugs Cross Reactors Other (See Comments)    Unknown childhood reaction.   . Contrast Media [Iodinated Diagnostic Agents] Rash  . Tetracyclines & Related Rash    Social History: The patient  reports that he quit smoking about 44 years ago. He has never used smokeless tobacco. He reports that he drinks alcohol. He reports that he does not use drugs.   Family History: The patient's family history includes Cancer in his mother; Dementia in his mother; Heart attack in his father and sister; Stroke in his sister.   Review of Systems: Please see the history of present illness.   Otherwise, the review of systems is positive for none.   All other systems are reviewed and negative.   Physical Exam: VS:  BP (!) 156/100 (BP Location: Left Arm, Patient Position: Sitting, Cuff Size: Normal)   Pulse 72   Ht 6' (1.829 m)   Wt 234 lb 6.4 oz (106.3 kg)   SpO2 95% Comment: at rest  BMI 31.79 kg/m  .  BMI Body mass index is 31.79 kg/m.  Wt Readings from Last 3 Encounters:  09/24/17 234 lb 6.4 oz (106.3 kg)  09/18/17 243 lb 3.2 oz (110.3 kg)  08/22/17 235 lb 12.8 oz (107 kg)   BP 156/100  - his machine reading 142/97  General: Pleasant. Clearly uncomfortable with back pain.  HEENT: Normal.  Neck: Supple, no JVD, carotid bruits, or masses noted.  Cardiac: Irregular irregular rhythm. Rate is ok. No murmurs, rubs, or gallops. No edema.  Respiratory:  Lungs are clear to auscultation bilaterally with normal work of breathing.  GI: Soft and nontender.  MS: No deformity or atrophy. Gait and ROM intact.  Skin: Warm and dry. Color is normal.  Neuro:  Strength and sensation are intact and no gross focal deficits noted.  Psych: Alert, appropriate and with normal affect.   LABORATORY DATA:  EKG:  EKG is not  ordered today. EKG from last month with AF noted with septal Q's - noted to be unchanged over the past 6 years.   Lab Results  Component Value Date   WBC 5.1 09/18/2017   HGB 15.7 09/18/2017   HCT 44.9 09/18/2017   PLT 120 (L) 09/18/2017   GLUCOSE 106 (H) 09/18/2017   ALT 22 09/21/2013   AST 25 09/21/2013   NA 140 09/18/2017   K 4.2 09/18/2017   CL 105 09/18/2017   CREATININE 1.34 (H) 09/18/2017   BUN 22 (H) 09/18/2017   CO2 27 09/18/2017   INR 1.31 09/18/2017  HGBA1C 5.1 09/18/2017       BNP (last 3 results) No results for input(s): BNP in the last 8760 hours.  ProBNP (last 3 results) No results for input(s): PROBNP in the last 8760 hours.   Other Studies Reviewed Today:  CT CHEST IMPRESSION 07/2015: 1. Multiple small solid pulmonary nodules are unchanged from baseline examination of November 2014, consistent with benign findings. 2. There is a single ground-glass nodule in the right middle lobe which is also unchanged and likely benign. Current guidelines call for longer follow-up of such nodules until 5 years of stability has been established. Therefore, consider 1 additional examination approximately 2 years now. This recommendation follows the consensus statement: Guidelines for Management of Incidental Pulmonary Nodules Detected on CT Images:From the Fleischner Society 2017; published online before print (10.1148/radiol.4098119147). 3. Coronary artery atherosclerosis. 4. Hepatic steatosis and cholelithiasis.   Electronically Signed By: Richardean Sale M.D. On: 08/04/2015 14:26   Echo Study Conclusions from 07/2014  - Left ventricle: The cavity size was normal. Wall thickness was increased in a pattern of mild LVH. Systolic function was normal. The estimated ejection fraction was in the range of 60% to 65%. - Mitral valve: There was mild regurgitation. - Left atrium: The atrium was severely dilated. - Right atrium: The atrium was mildly  dilated. - Pulmonary arteries: PA peak pressure: 32 mm Hg (S).      Myoview Study Highlights from 02/2015    Defect 1: There is a small defect of mild severity present in the basal inferior and basal inferolateral location.  There was no ST segment deviation noted during stress.  Thinning in the inferior / inferolateral region (base) Otherwise normal perfusion. May represent soft tissue attenuation vs small region of scar Images not gated.  Overall low risk scan.      ASSESSMENT AND PLAN:  1. Pre op clearance - planning on back surgery later this week with Dr. Christella Noa. His quality of life currently not acceptable to him. He has no current cardiac symptoms other than uncontrolled HTN. His activity level is probably right at 4 mets - was higher when he was not having back issues. Last Myoview stable. Should be an acceptable candidate for his surgery. He has already been instructed to hold his Eliquis 3 days prior to surgery. He will be at increased risk of stroke with stopping Eliquis as well as bleeding potentially post op. Will be available as needed. Discussed with pharmacy here today - they agree about holding Eliquis 3 days prior.   2. Multiple CV risk factors- low risk Myoview from October of 2016noted. He is not having chest pain. He has become more sedentary due to back pain. This is unchanged.   3. Chronic Atrial fibrillation Patient has chronic atrial fibrillation and remains on Eliquis. Rate is controlled.   4. HLD- on statin therapy - lipids followed by PCP - noted lipids from December off [place]  5. HTN- BP remains up - I am hesitant to increase ACE with upcoming surgery/anesthesia. Will add Norvasc 5 mg a day. I suspect once his back pain improves, his BP will come back down. May not need all the agents that he is currently taking. He will continue to monitor. I will see back in a month.   6. Lung nodule- needs updated -  We did talk about this  today. Will plan on arranging at next visit.    Current medicines are reviewed with the patient today.  The patient does not have concerns regarding  medicines other than what has been noted above.  The following changes have been made:  See above.  Labs/ tests ordered today include:   No orders of the defined types were placed in this encounter.    Disposition:   FU with me in one month.   Patient is agreeable to this plan and will call if any problems develop in the interim.   SignedTruitt Merle, NP  09/24/2017 12:15 PM  [place] [address] [place], [place]  [address] Phone: [phone] Fax: [phone]

## 2017-09-24 NOTE — Patient Instructions (Addendum)
We will be checking the following labs today -    Medication Instructions:    Continue with your current medicines. BUT  I am adding Norvasc 5 mg a day - start today    Testing/Procedures To Be Arranged:  Needs follow up CT of the chest - will plan to arrange after your surgery.   Follow-Up:   See me in about a month    Other Special Instructions:   N/A    If you need a refill on your cardiac medications before your next appointment, please call your pharmacy.   Call the [place] office at [phone] if you have any questions, problems or concerns.

## 2017-09-27 ENCOUNTER — Ambulatory Visit (HOSPITAL_COMMUNITY): Payer: Medicare Other | Admitting: Emergency Medicine

## 2017-09-27 ENCOUNTER — Ambulatory Visit (HOSPITAL_COMMUNITY)
Admission: RE | Disposition: A | Discharge status: Home / Self Care | Payer: Self-pay | Source: Ambulatory Visit | Attending: Neurosurgery

## 2017-09-27 ENCOUNTER — Ambulatory Visit (HOSPITAL_COMMUNITY)
Admission: RE | Admit: 2017-09-27 | Discharge: 2017-09-28 | Disposition: A | Discharge status: Home / Self Care | Payer: Medicare Other | Source: Ambulatory Visit | Attending: Neurosurgery | Admitting: Neurosurgery

## 2017-09-27 ENCOUNTER — Ambulatory Visit (HOSPITAL_COMMUNITY): Payer: Medicare Other

## 2017-09-27 ENCOUNTER — Encounter (HOSPITAL_COMMUNITY): Payer: Self-pay | Admitting: *Deleted

## 2017-09-27 DIAGNOSIS — M48062 Spinal stenosis, lumbar region with neurogenic claudication: Secondary | ICD-10-CM | POA: Insufficient documentation

## 2017-09-27 DIAGNOSIS — Z7984 Long term (current) use of oral hypoglycemic drugs: Secondary | ICD-10-CM | POA: Diagnosis not present

## 2017-09-27 DIAGNOSIS — M1990 Unspecified osteoarthritis, unspecified site: Secondary | ICD-10-CM | POA: Insufficient documentation

## 2017-09-27 DIAGNOSIS — M48061 Spinal stenosis, lumbar region without neurogenic claudication: Secondary | ICD-10-CM | POA: Diagnosis present

## 2017-09-27 DIAGNOSIS — G4730 Sleep apnea, unspecified: Secondary | ICD-10-CM | POA: Insufficient documentation

## 2017-09-27 DIAGNOSIS — Z419 Encounter for procedure for purposes other than remedying health state, unspecified: Secondary | ICD-10-CM

## 2017-09-27 DIAGNOSIS — H9319 Tinnitus, unspecified ear: Secondary | ICD-10-CM | POA: Diagnosis not present

## 2017-09-27 DIAGNOSIS — Z91041 Radiographic dye allergy status: Secondary | ICD-10-CM | POA: Insufficient documentation

## 2017-09-27 DIAGNOSIS — Z809 Family history of malignant neoplasm, unspecified: Secondary | ICD-10-CM | POA: Diagnosis not present

## 2017-09-27 DIAGNOSIS — I10 Essential (primary) hypertension: Secondary | ICD-10-CM | POA: Insufficient documentation

## 2017-09-27 DIAGNOSIS — E119 Type 2 diabetes mellitus without complications: Secondary | ICD-10-CM | POA: Diagnosis not present

## 2017-09-27 DIAGNOSIS — Z882 Allergy status to sulfonamides status: Secondary | ICD-10-CM | POA: Insufficient documentation

## 2017-09-27 DIAGNOSIS — Z7901 Long term (current) use of anticoagulants: Secondary | ICD-10-CM | POA: Insufficient documentation

## 2017-09-27 DIAGNOSIS — Z8546 Personal history of malignant neoplasm of prostate: Secondary | ICD-10-CM | POA: Diagnosis not present

## 2017-09-27 DIAGNOSIS — Z85828 Personal history of other malignant neoplasm of skin: Secondary | ICD-10-CM | POA: Insufficient documentation

## 2017-09-27 DIAGNOSIS — Z87891 Personal history of nicotine dependence: Secondary | ICD-10-CM | POA: Insufficient documentation

## 2017-09-27 DIAGNOSIS — H9190 Unspecified hearing loss, unspecified ear: Secondary | ICD-10-CM | POA: Diagnosis not present

## 2017-09-27 DIAGNOSIS — Z79899 Other long term (current) drug therapy: Secondary | ICD-10-CM | POA: Diagnosis not present

## 2017-09-27 DIAGNOSIS — F329 Major depressive disorder, single episode, unspecified: Secondary | ICD-10-CM | POA: Insufficient documentation

## 2017-09-27 DIAGNOSIS — I4891 Unspecified atrial fibrillation: Secondary | ICD-10-CM | POA: Diagnosis not present

## 2017-09-27 DIAGNOSIS — Z96659 Presence of unspecified artificial knee joint: Secondary | ICD-10-CM | POA: Insufficient documentation

## 2017-09-27 DIAGNOSIS — Z881 Allergy status to other antibiotic agents status: Secondary | ICD-10-CM | POA: Diagnosis not present

## 2017-09-27 DIAGNOSIS — Z8249 Family history of ischemic heart disease and other diseases of the circulatory system: Secondary | ICD-10-CM | POA: Insufficient documentation

## 2017-09-27 HISTORY — PX: LUMBAR LAMINECTOMY/DECOMPRESSION MICRODISCECTOMY: SHX5026

## 2017-09-27 LAB — PROTIME-INR
INR: 1.13
Prothrombin Time: 14.4 seconds (ref 11.4–15.2)

## 2017-09-27 LAB — GLUCOSE, CAPILLARY
Glucose-Capillary: 113 mg/dL — ABNORMAL HIGH (ref 65–99)
Glucose-Capillary: 89 mg/dL (ref 65–99)
Glucose-Capillary: 117 mg/dL — ABNORMAL HIGH (ref 65–99)
Glucose-Capillary: 182 mg/dL — ABNORMAL HIGH (ref 65–99)

## 2017-09-27 SURGERY — LUMBAR LAMINECTOMY/DECOMPRESSION MICRODISCECTOMY 1 LEVEL
Anesthesia: General | Site: Spine Lumbar

## 2017-09-27 MED ORDER — LIDOCAINE HCL (CARDIAC) PF 100 MG/5ML IV SOSY
PREFILLED_SYRINGE | INTRAVENOUS | Status: DC | PRN
  Administered 2017-09-27: 80 mg via INTRAVENOUS

## 2017-09-27 MED ORDER — ONDANSETRON HCL 4 MG PO TABS: 4.0000 mg | ORAL_TABLET | Freq: Four times a day (QID) | ORAL | Status: DC | PRN

## 2017-09-27 MED ORDER — DEXAMETHASONE SODIUM PHOSPHATE 10 MG/ML IJ SOLN
INTRAMUSCULAR | Status: AC
  Filled 2017-09-27: qty 1

## 2017-09-27 MED ORDER — HYDROCODONE-ACETAMINOPHEN 7.5-325 MG PO TABS
1.0000 | ORAL_TABLET | ORAL | Status: DC | PRN
  Administered 2017-09-27: 1 via ORAL
  Filled 2017-09-27: qty 1

## 2017-09-27 MED ORDER — CELECOXIB 200 MG PO CAPS: 200.0000 mg | ORAL_CAPSULE | Freq: Two times a day (BID) | ORAL | Status: DC

## 2017-09-27 MED ORDER — LIDOCAINE 2% (20 MG/ML) 5 ML SYRINGE
INTRAMUSCULAR | Status: AC
  Filled 2017-09-27: qty 5

## 2017-09-27 MED ORDER — PHENOL 1.4 % MT LIQD: 1.0000 | OROMUCOSAL | Status: DC | PRN

## 2017-09-27 MED ORDER — PROMETHAZINE HCL 25 MG/ML IJ SOLN: 6.2500 mg | INTRAMUSCULAR | Status: DC | PRN

## 2017-09-27 MED ORDER — OXYCODONE HCL 5 MG/5ML PO SOLN: 5.0000 mg | Freq: Once | ORAL | Status: DC | PRN

## 2017-09-27 MED ORDER — ALLOPURINOL 300 MG PO TABS
300.0000 mg | ORAL_TABLET | Freq: Every day | ORAL | Status: DC
  Administered 2017-09-28: 300 mg via ORAL
  Filled 2017-09-27: qty 1

## 2017-09-27 MED ORDER — PROPOFOL 10 MG/ML IV BOLUS
INTRAVENOUS | Status: DC | PRN
  Administered 2017-09-27: 150 mg via INTRAVENOUS

## 2017-09-27 MED ORDER — FENTANYL CITRATE (PF) 100 MCG/2ML IJ SOLN
INTRAMUSCULAR | Status: DC | PRN
  Administered 2017-09-27: 100 ug via INTRAVENOUS

## 2017-09-27 MED ORDER — POTASSIUM CHLORIDE IN NACL 20-0.9 MEQ/L-% IV SOLN: INTRAVENOUS | Status: DC

## 2017-09-27 MED ORDER — AMLODIPINE BESYLATE 5 MG PO TABS
5.0000 mg | ORAL_TABLET | Freq: Every day | ORAL | Status: DC
  Administered 2017-09-28: 5 mg via ORAL
  Filled 2017-09-27: qty 1

## 2017-09-27 MED ORDER — DIAZEPAM 5 MG PO TABS
5.0000 mg | ORAL_TABLET | Freq: Four times a day (QID) | ORAL | Status: DC | PRN
  Administered 2017-09-28: 5 mg via ORAL
  Filled 2017-09-27: qty 1

## 2017-09-27 MED ORDER — CEFAZOLIN SODIUM-DEXTROSE 2-4 GM/100ML-% IV SOLN
2.0000 g | INTRAVENOUS | Status: AC
  Administered 2017-09-27: 2 g via INTRAVENOUS
  Filled 2017-09-27: qty 100

## 2017-09-27 MED ORDER — HYDROMORPHONE HCL 2 MG/ML IJ SOLN
INTRAMUSCULAR | Status: AC
  Filled 2017-09-27: qty 1

## 2017-09-27 MED ORDER — ROCURONIUM BROMIDE 50 MG/5ML IV SOLN
INTRAVENOUS | Status: AC
  Filled 2017-09-27: qty 3

## 2017-09-27 MED ORDER — EPHEDRINE SULFATE 50 MG/ML IJ SOLN
INTRAMUSCULAR | Status: DC | PRN
  Administered 2017-09-27: 10 mg via INTRAVENOUS
  Administered 2017-09-27 (×2): 5 mg via INTRAVENOUS
  Administered 2017-09-27: 10 mg via INTRAVENOUS

## 2017-09-27 MED ORDER — THROMBIN (RECOMBINANT) 5000 UNITS EX SOLR
CUTANEOUS | Status: DC | PRN
  Administered 2017-09-27 (×2): 5000 [IU] via TOPICAL

## 2017-09-27 MED ORDER — MENTHOL 3 MG MT LOZG: 1.0000 | LOZENGE | OROMUCOSAL | Status: DC | PRN

## 2017-09-27 MED ORDER — ACETAMINOPHEN 325 MG PO TABS: 650.0000 mg | ORAL_TABLET | ORAL | Status: DC | PRN

## 2017-09-27 MED ORDER — GABAPENTIN 300 MG PO CAPS
300.0000 mg | ORAL_CAPSULE | Freq: Three times a day (TID) | ORAL | Status: DC
  Administered 2017-09-27 – 2017-09-28 (×3): 300 mg via ORAL
  Filled 2017-09-27 (×4): qty 1

## 2017-09-27 MED ORDER — OXYCODONE HCL 5 MG PO TABS: 5.0000 mg | ORAL_TABLET | Freq: Once | ORAL | Status: DC | PRN

## 2017-09-27 MED ORDER — KETOROLAC TROMETHAMINE 15 MG/ML IJ SOLN
7.5000 mg | Freq: Four times a day (QID) | INTRAMUSCULAR | Status: AC
  Administered 2017-09-27 – 2017-09-28 (×4): 7.5 mg via INTRAVENOUS
  Filled 2017-09-27 (×3): qty 1

## 2017-09-27 MED ORDER — LISINOPRIL 10 MG PO TABS
5.0000 mg | ORAL_TABLET | Freq: Two times a day (BID) | ORAL | Status: DC
  Administered 2017-09-27 – 2017-09-28 (×2): 5 mg via ORAL
  Filled 2017-09-27 (×2): qty 1

## 2017-09-27 MED ORDER — LIDOCAINE-EPINEPHRINE 0.5 %-1:200000 IJ SOLN
INTRAMUSCULAR | Status: AC
  Filled 2017-09-27: qty 1

## 2017-09-27 MED ORDER — SODIUM CHLORIDE 0.9% FLUSH: 3.0000 mL | INTRAVENOUS | Status: DC | PRN

## 2017-09-27 MED ORDER — ATORVASTATIN CALCIUM 20 MG PO TABS: 10.0000 mg | ORAL_TABLET | Freq: Every evening | ORAL | Status: DC

## 2017-09-27 MED ORDER — LACTATED RINGERS IV SOLN
INTRAVENOUS | Status: DC | PRN
  Administered 2017-09-27: 13:00:00 via INTRAVENOUS

## 2017-09-27 MED ORDER — 0.9 % SODIUM CHLORIDE (POUR BTL) OPTIME
TOPICAL | Status: DC | PRN
  Administered 2017-09-27: 1000 mL

## 2017-09-27 MED ORDER — LIDOCAINE-EPINEPHRINE 0.5 %-1:200000 IJ SOLN
INTRAMUSCULAR | Status: DC | PRN
  Administered 2017-09-27: 10 mL

## 2017-09-27 MED ORDER — SUGAMMADEX SODIUM 200 MG/2ML IV SOLN
INTRAVENOUS | Status: DC | PRN
  Administered 2017-09-27: 200 mg via INTRAVENOUS

## 2017-09-27 MED ORDER — SODIUM CHLORIDE 0.9 % IV SOLN: 250.0000 mL | INTRAVENOUS | Status: DC

## 2017-09-27 MED ORDER — THROMBIN 5000 UNITS EX SOLR
CUTANEOUS | Status: AC
  Filled 2017-09-27: qty 10000

## 2017-09-27 MED ORDER — ONDANSETRON HCL 4 MG/2ML IJ SOLN: 4.0000 mg | Freq: Four times a day (QID) | INTRAMUSCULAR | Status: DC | PRN

## 2017-09-27 MED ORDER — PHENYLEPHRINE HCL 10 MG/ML IJ SOLN
INTRAMUSCULAR | Status: DC | PRN
  Administered 2017-09-27 (×3): 80 ug via INTRAVENOUS

## 2017-09-27 MED ORDER — METFORMIN HCL 500 MG PO TABS
500.0000 mg | ORAL_TABLET | Freq: Every day | ORAL | Status: DC
  Administered 2017-09-28: 500 mg via ORAL
  Filled 2017-09-27: qty 1

## 2017-09-27 MED ORDER — CHLORHEXIDINE GLUCONATE CLOTH 2 % EX PADS: 6.0000 | MEDICATED_PAD | Freq: Once | CUTANEOUS | Status: DC

## 2017-09-27 MED ORDER — HYDROCODONE-ACETAMINOPHEN 7.5-325 MG PO TABS
1.0000 | ORAL_TABLET | Freq: Four times a day (QID) | ORAL | Status: DC
  Administered 2017-09-27 – 2017-09-28 (×3): 1 via ORAL
  Filled 2017-09-27 (×3): qty 1

## 2017-09-27 MED ORDER — MORPHINE SULFATE (PF) 4 MG/ML IV SOLN: 1.0000 mg | INTRAVENOUS | Status: DC | PRN

## 2017-09-27 MED ORDER — DEXTROSE 5 % IV SOLN
INTRAVENOUS | Status: DC | PRN
  Administered 2017-09-27: 25 ug/min via INTRAVENOUS

## 2017-09-27 MED ORDER — HEMOSTATIC AGENTS (NO CHARGE) OPTIME
TOPICAL | Status: DC | PRN
  Administered 2017-09-27: 1 via TOPICAL

## 2017-09-27 MED ORDER — ATENOLOL 25 MG PO TABS
25.0000 mg | ORAL_TABLET | Freq: Every day | ORAL | Status: DC
  Administered 2017-09-28: 25 mg via ORAL
  Filled 2017-09-27: qty 1

## 2017-09-27 MED ORDER — DEXAMETHASONE SODIUM PHOSPHATE 10 MG/ML IJ SOLN
INTRAMUSCULAR | Status: DC | PRN
  Administered 2017-09-27: 5 mg via INTRAVENOUS

## 2017-09-27 MED ORDER — PROPOFOL 10 MG/ML IV BOLUS
INTRAVENOUS | Status: AC
  Filled 2017-09-27: qty 20

## 2017-09-27 MED ORDER — SUGAMMADEX SODIUM 200 MG/2ML IV SOLN
INTRAVENOUS | Status: AC
  Filled 2017-09-27: qty 2

## 2017-09-27 MED ORDER — ACETAMINOPHEN 650 MG RE SUPP: 650.0000 mg | RECTAL | Status: DC | PRN

## 2017-09-27 MED ORDER — EPHEDRINE SULFATE 50 MG/ML IJ SOLN
INTRAMUSCULAR | Status: AC
  Filled 2017-09-27: qty 1

## 2017-09-27 MED ORDER — HYDROMORPHONE HCL 2 MG/ML IJ SOLN
0.2500 mg | INTRAMUSCULAR | Status: DC | PRN
  Administered 2017-09-27 (×2): 0.5 mg via INTRAVENOUS

## 2017-09-27 MED ORDER — OXYCODONE HCL 5 MG PO TABS: 10.0000 mg | ORAL_TABLET | ORAL | Status: DC | PRN

## 2017-09-27 MED ORDER — ATENOLOL-CHLORTHALIDONE 50-25 MG PO TABS: 0.5000 | ORAL_TABLET | Freq: Every day | ORAL | Status: DC

## 2017-09-27 MED ORDER — ROCURONIUM BROMIDE 100 MG/10ML IV SOLN
INTRAVENOUS | Status: DC | PRN
  Administered 2017-09-27: 50 mg via INTRAVENOUS
  Administered 2017-09-27: 10 mg via INTRAVENOUS
  Administered 2017-09-27: 20 mg via INTRAVENOUS

## 2017-09-27 MED ORDER — ADULT MULTIVITAMIN W/MINERALS CH
1.0000 | ORAL_TABLET | Freq: Every day | ORAL | Status: DC
  Administered 2017-09-27 – 2017-09-28 (×2): 1 via ORAL
  Filled 2017-09-27 (×2): qty 1

## 2017-09-27 MED ORDER — ZOLPIDEM TARTRATE 5 MG PO TABS: 5.0000 mg | ORAL_TABLET | Freq: Every evening | ORAL | Status: DC | PRN

## 2017-09-27 MED ORDER — CELECOXIB 200 MG PO CAPS
200.0000 mg | ORAL_CAPSULE | Freq: Two times a day (BID) | ORAL | Status: DC
  Administered 2017-09-28: 200 mg via ORAL
  Filled 2017-09-27: qty 1

## 2017-09-27 MED ORDER — SODIUM CHLORIDE 0.9% FLUSH
3.0000 mL | Freq: Two times a day (BID) | INTRAVENOUS | Status: DC
  Administered 2017-09-27: 3 mL via INTRAVENOUS

## 2017-09-27 MED ORDER — FENTANYL CITRATE (PF) 250 MCG/5ML IJ SOLN
INTRAMUSCULAR | Status: AC
  Filled 2017-09-27: qty 5

## 2017-09-27 MED ORDER — ONDANSETRON HCL 4 MG/2ML IJ SOLN
INTRAMUSCULAR | Status: AC
  Filled 2017-09-27: qty 2

## 2017-09-27 MED ORDER — CHLORTHALIDONE 25 MG PO TABS
12.5000 mg | ORAL_TABLET | Freq: Every day | ORAL | Status: DC
  Administered 2017-09-28: 12.5 mg via ORAL
  Filled 2017-09-27: qty 0.5

## 2017-09-27 SURGICAL SUPPLY — 50 items
BENZOIN TINCTURE PRP APPL 2/3 (GAUZE/BANDAGES/DRESSINGS) IMPLANT
BLADE CLIPPER SURG (BLADE) IMPLANT
BUR MATCHSTICK NEURO 3.0 LAGG (BURR) ×2 IMPLANT
BUR PRECISION FLUTE 5.0 (BURR) ×2 IMPLANT
CANISTER SUCT 3000ML PPV (MISCELLANEOUS) ×2 IMPLANT
CARTRIDGE OIL MAESTRO DRILL (MISCELLANEOUS) ×1 IMPLANT
DECANTER SPIKE VIAL GLASS SM (MISCELLANEOUS) ×2 IMPLANT
DERMABOND ADVANCED (GAUZE/BANDAGES/DRESSINGS) ×1
DERMABOND ADVANCED .7 DNX12 (GAUZE/BANDAGES/DRESSINGS) ×1 IMPLANT
DIFFUSER DRILL AIR PNEUMATIC (MISCELLANEOUS) ×2 IMPLANT
DRAPE LAPAROTOMY 100X72X124 (DRAPES) ×2 IMPLANT
DRAPE MICROSCOPE LEICA (MISCELLANEOUS) ×2 IMPLANT
DRAPE SURG 17X23 STRL (DRAPES) ×2 IMPLANT
DURAPREP 26ML APPLICATOR (WOUND CARE) ×2 IMPLANT
ELECT REM PT RETURN 9FT ADLT (ELECTROSURGICAL) ×2
ELECTRODE REM PT RTRN 9FT ADLT (ELECTROSURGICAL) ×1 IMPLANT
GAUZE SPONGE 4X4 12PLY STRL (GAUZE/BANDAGES/DRESSINGS) IMPLANT
GAUZE SPONGE 4X4 16PLY XRAY LF (GAUZE/BANDAGES/DRESSINGS) IMPLANT
GLOVE BIOGEL PI IND STRL 7.0 (GLOVE) ×1 IMPLANT
GLOVE BIOGEL PI IND STRL 7.5 (GLOVE) ×4 IMPLANT
GLOVE BIOGEL PI INDICATOR 7.0 (GLOVE) ×1
GLOVE BIOGEL PI INDICATOR 7.5 (GLOVE) ×4
GLOVE ECLIPSE 6.5 STRL STRAW (GLOVE) ×4 IMPLANT
GLOVE EXAM NITRILE LRG STRL (GLOVE) IMPLANT
GLOVE EXAM NITRILE XL STR (GLOVE) IMPLANT
GLOVE EXAM NITRILE XS STR PU (GLOVE) IMPLANT
GOWN STRL REUS W/ TWL LRG LVL3 (GOWN DISPOSABLE) ×1 IMPLANT
GOWN STRL REUS W/ TWL XL LVL3 (GOWN DISPOSABLE) ×1 IMPLANT
GOWN STRL REUS W/TWL 2XL LVL3 (GOWN DISPOSABLE) IMPLANT
GOWN STRL REUS W/TWL LRG LVL3 (GOWN DISPOSABLE) ×1
GOWN STRL REUS W/TWL XL LVL3 (GOWN DISPOSABLE) ×1
KIT BASIN OR (CUSTOM PROCEDURE TRAY) ×2 IMPLANT
KIT TURNOVER KIT B (KITS) ×2 IMPLANT
NEEDLE HYPO 25X1 1.5 SAFETY (NEEDLE) ×2 IMPLANT
NEEDLE SPNL 18GX3.5 QUINCKE PK (NEEDLE) ×2 IMPLANT
NS IRRIG 1000ML POUR BTL (IV SOLUTION) ×2 IMPLANT
OIL CARTRIDGE MAESTRO DRILL (MISCELLANEOUS) ×2
PACK LAMINECTOMY NEURO (CUSTOM PROCEDURE TRAY) ×2 IMPLANT
PAD ARMBOARD 7.5X6 YLW CONV (MISCELLANEOUS) ×6 IMPLANT
RUBBERBAND STERILE (MISCELLANEOUS) ×4 IMPLANT
SPONGE LAP 4X18 X RAY DECT (DISPOSABLE) IMPLANT
SPONGE SURGIFOAM ABS GEL SZ50 (HEMOSTASIS) ×2 IMPLANT
STRIP CLOSURE SKIN 1/2X4 (GAUZE/BANDAGES/DRESSINGS) IMPLANT
SUT VIC AB 0 CT1 18XCR BRD8 (SUTURE) ×2 IMPLANT
SUT VIC AB 0 CT1 8-18 (SUTURE) ×2
SUT VIC AB 2-0 CT1 18 (SUTURE) ×2 IMPLANT
SUT VIC AB 3-0 SH 8-18 (SUTURE) ×2 IMPLANT
TOWEL GREEN STERILE (TOWEL DISPOSABLE) ×2 IMPLANT
TOWEL GREEN STERILE FF (TOWEL DISPOSABLE) ×2 IMPLANT
WATER STERILE IRR 1000ML POUR (IV SOLUTION) ×2 IMPLANT

## 2017-09-27 NOTE — H&P (Signed)
BP (!) 148/97   Pulse 66   Temp 98 F (36.7 C) (Oral)   Resp 18   Ht 6' (1.829 m)   Wt 106.1 kg (234 lb)   SpO2 99%   BMI 31.74 kg/m  [name] is a patient of mine whom I have last seen in 2014. At that time, he had stenosis present at 3-4 and 4-5. I therefore elected to have him go to pain management, as he, at that point, did not believe he needed to undergo operative decompression. He has done well obviously for the last 5 years, but has gotten worse since. [name] and [name] have done serial MRIs, and he presented with an MRI in April of this year, which the radiologist took the point of noting it was worse at 3-4 in terms of stenosis than the scan that had been done in October. Height 6 feet, weight 237 pounds, blood pressure is 128/88, pulse is 80, and temperature is 98.7. ALLERGIES: He has allergies to Tetracycline, and Sulfa-containing medications. He also is allergic to iodine contrast. SOCIAL HISTORY: He is retired and retired in the time since I saw him last, when he was still working for himself as a Games developer. He is left-handed. A former smoker. Still drinks socially. No history of substance abuse. PAST MEDICAL HISTORY: He has a history of hypertension, diabetes, prostate cancer, and skin cancer. PAST SURGICAL HISTORY: He has undergone rotator cuff surgery, prostate seed implants, and he has had a knee replacement. MEDICATIONS: He takes Metformin, Atorvastatin, Celecoxib, Eliquis for atrial fibrillation, Atenolol, Allopurinol, Tramadol, and PreserVision for macular degeneration in the right eye, and a Centrum Silver multivitamin. FAMILY HISTORY: Mother died at age 30 secondary to cancer. Father died age 39 secondary to heart disease. REVIEW OF SYSTEMS: Positive for hearing loss, tinnitus, leg pain with walking, arthritis, and back pain. EXAM:  History and Physical Page 2 He is alert and oriented by 4 and answers all questions appropriately. Memory,  language, attention span, and fund of knowledge are normal. Speech is clear, and it is also fluent. Hearing intact to voice. Uvula elevates at midline. Shoulder shrug is normal. Tongue protrudes in the midline. He has 5/5 strength in both upper and lower extremities except for some weakness in the dorsiflexors in the left foot at 4/5. He has intact proprioception, intact light touch. Pupils are equal, round, and reactive to light. Full extraocular movements. Full visual fields. Hearing intact to voice. Uvula elevates midline. Shoulder shrug is normal. Tongue protrudes in the midline. 2+ reflexes, biceps, triceps, brachioradialis, knees, and ankles. Romberg is negative. Gait is otherwise normal. MRI shows significant stenosis present at L3-4 and L4-5. At L4-5, he also has fairly severe left foraminal narrowing and lateral recess narrowing. ASSESSMENT AND PLAN: At this point, [name] does not hesitate to state that it hurts him more to stand and walk for any length of time, and it is worse now than it had been and that he would like to proceed with operative decompression. The risks and benefits of bleeding, infection, no relief, need for further surgery, damage to the nerve roots, weakness in one or both lower extremities, bowel and/or bladder dysfunction were discussed. He understands and wishes to proceed. This will occur only after May 10. I would expect him to be in the hospital overnight only.

## 2017-09-27 NOTE — Op Note (Signed)
09/27/2017  3:54 PM  PATIENT:  [name]  72 y.o. male with neurogenic claudication and lumbar stenosis. He is admitted for a lumbar decompression at L3,4,and 5.  PRE-OPERATIVE DIAGNOSIS:  LUMBAR STENOSIS WITH NEUROGENIC CLAUDICATION L3/4,4/5 POST-OPERATIVE DIAGNOSIS:  LUMBAR STENOSIS WITH NEUROGENIC CLAUDICATION  PROCEDURE:  Procedure(s): LAMINECTOMY LUMBAR THREE- LUMBAR FOUR, LUMBAR FOUR- LUMBAR FIVE  SURGEON: Surgeon(s): Ashok Pall, MD Erline Levine, MD  ASSISTANTS:Stern, Broadus John  ANESTHESIA:   general  EBL:  Total I/O In: 900 [I.V.:900] Out: 100 [Blood:100]  BLOOD ADMINISTERED:none  CELL SAVER GIVEN:none  COUNT:per nursing  DRAINS: none   SPECIMEN:  No Specimen  DICTATION: [name] was taken to the operating room, intubated, and placed under a general anesthetic  without difficulty. He was positioned prone on the wilson frame with all pressure points properly padded. His lumbar region was prepped and draped in a sterile manner. I infiltrated lidocaine into the planned incision. I opened the skin with a 10 blade and dissected sharply through the subcutaneous plane. I used monopolar cautery to expose the lamina of 3,4, and L5 bilaterally. I confirmed my location with an xray.  I decompressed the spinal canal via complete laminectomies of L3, and L4. I used the Leksell rongeur, drill, and Kerrison punches to remove the bone, and ligament. I did perform medial facetectomies of the inferior facets of L3, and L4 bilaterally. I removed the ligamentum flavum and with Dr. Melven Sartorius assistance decompressed the thecal sac and the L3,4, and L5 roots bilaterally.We inspected the thecal sac and the nerve roots and felt they were well decompressed. We irrigated the wound, then started to close.  We approximated the thoracolumbar fascia, subcutaneous, and subcuticular planes with vicryl sutures. I applied Dermabond for a sterile dressing.  PLAN OF CARE: Admit for overnight  observation  PATIENT DISPOSITION:  PACU - hemodynamically stable.   Delay start of Pharmacological VTE agent (>24hrs) due to surgical blood loss or risk of bleeding:  yes

## 2017-09-27 NOTE — Transfer of Care (Signed)
Immediate Anesthesia Transfer of Care Note  Patient: [name]  Procedure(s) Performed: LAMINECTOMY LUMBAR THREE- LUMBAR FOUR, LUMBAR FOUR- LUMBAR FIVE (N/A Spine Lumbar)  Patient Location: PACU  Anesthesia Type:General  Level of Consciousness: awake, alert , oriented and patient cooperative  Airway & Oxygen Therapy: Patient Spontanous Breathing and Patient connected to nasal cannula oxygen  Post-op Assessment: Report given to RN, Post -op Vital signs reviewed and stable and Patient moving all extremities  Post vital signs: Reviewed and stable  Last Vitals:  Vitals Value Taken Time  BP    Temp    Pulse 79 09/27/2017  3:47 PM  Resp 16 09/27/2017  3:47 PM  SpO2 98 % 09/27/2017  3:47 PM  Vitals shown include unvalidated device data.  Last Pain:  Vitals:   09/27/17 1023  TempSrc:   PainSc: 9          Complications: No apparent anesthesia complications

## 2017-09-27 NOTE — Anesthesia Preprocedure Evaluation (Signed)
Anesthesia Evaluation  Patient identified by MRN, date of birth, ID band Patient awake    Reviewed: Allergy & Precautions, H&P , NPO status , Patient's Chart, lab work & pertinent test results, reviewed documented beta blocker date and time   Airway Mallampati: II  TM Distance: >3 FB Neck ROM: Full    Dental no notable dental hx. (+) Edentulous Upper, Dental Advisory Given, Partial Lower   Pulmonary sleep apnea and Continuous Positive Airway Pressure Ventilation , former smoker,    Pulmonary exam normal breath sounds clear to auscultation       Cardiovascular hypertension, Pt. on medications and Pt. on home beta blockers + dysrhythmias Atrial Fibrillation  Rhythm:Irregular Rate:Normal     Neuro/Psych Depression negative neurological ROS  negative psych ROS   GI/Hepatic negative GI ROS, Neg liver ROS,   Endo/Other  diabetes, Type 2, Oral Hypoglycemic Agents  Renal/GU negative Renal ROS  negative genitourinary   Musculoskeletal   Abdominal   Peds  Hematology negative hematology ROS (+)   Anesthesia Other Findings   Reproductive/Obstetrics negative OB ROS                             Anesthesia Physical  Anesthesia Plan  ASA: III  Anesthesia Plan: General   Post-op Pain Management:    Induction: Intravenous  PONV Risk Score and Plan: 2 and Ondansetron and Midazolam  Airway Management Planned: Oral ETT  Additional Equipment:   Intra-op Plan:   Post-operative Plan: Extubation in OR  Informed Consent: I have reviewed the patients History and Physical, chart, labs and discussed the procedure including the risks, benefits and alternatives for the proposed anesthesia with the patient or authorized representative who has indicated his/her understanding and acceptance.   Dental advisory given  Plan Discussed with: CRNA and Surgeon  Anesthesia Plan Comments:         Anesthesia  Quick Evaluation

## 2017-09-27 NOTE — Anesthesia Postprocedure Evaluation (Signed)
Anesthesia Post Note  Patient: [name]  Procedure(s) Performed: LAMINECTOMY LUMBAR THREE- LUMBAR FOUR, LUMBAR FOUR- LUMBAR FIVE (N/A Spine Lumbar)     Patient location during evaluation: PACU Anesthesia Type: General Level of consciousness: awake and alert Pain management: pain level controlled Vital Signs Assessment: post-procedure vital signs reviewed and stable Respiratory status: spontaneous breathing, nonlabored ventilation and respiratory function stable Cardiovascular status: blood pressure returned to baseline and stable Postop Assessment: no apparent nausea or vomiting Anesthetic complications: no    Last Vitals:  Vitals:   09/27/17 1650 09/27/17 1705  BP: 123/86 100/70  Pulse: 71 76  Resp: 16 18  Temp:  36.6 C  SpO2: 97% 96%    Last Pain:  Vitals:   09/27/17 1705  TempSrc:   PainSc: 3                  Lynda Rainwater

## 2017-09-27 NOTE — Anesthesia Procedure Notes (Signed)
Procedure Name: Intubation Date/Time: 09/27/2017 1:24 PM Performed by: Glynda Jaeger, CRNA Pre-anesthesia Checklist: Patient identified, Patient being monitored, Timeout performed, Emergency Drugs available and Suction available Patient Re-evaluated:Patient Re-evaluated prior to induction Oxygen Delivery Method: Circle System Utilized Preoxygenation: Pre-oxygenation with 100% oxygen Induction Type: IV induction Ventilation: Oral airway inserted - appropriate to patient size Laryngoscope Size: Mac and 4 Grade View: Grade II Tube type: Oral Tube size: 7.5 mm Number of attempts: 1 Airway Equipment and Method: Stylet Placement Confirmation: ETT inserted through vocal cords under direct vision,  positive ETCO2 and breath sounds checked- equal and bilateral Secured at: 22 cm Tube secured with: Tape Dental Injury: Teeth and Oropharynx as per pre-operative assessment

## 2017-09-28 ENCOUNTER — Encounter (HOSPITAL_COMMUNITY): Payer: Self-pay | Admitting: Neurosurgery

## 2017-09-28 DIAGNOSIS — M48062 Spinal stenosis, lumbar region with neurogenic claudication: Secondary | ICD-10-CM | POA: Diagnosis not present

## 2017-09-28 LAB — GLUCOSE, CAPILLARY
Glucose-Capillary: 140 mg/dL — ABNORMAL HIGH (ref 65–99)
Glucose-Capillary: 109 mg/dL — ABNORMAL HIGH (ref 65–99)

## 2017-09-28 MED ORDER — TIZANIDINE HCL 4 MG PO TABS: 4.0000 mg | ORAL_TABLET | Freq: Four times a day (QID) | ORAL | 0 refills | Status: DC | PRN

## 2017-09-28 MED ORDER — HYDROCODONE-ACETAMINOPHEN 5-325 MG PO TABS: 1.0000 | ORAL_TABLET | Freq: Four times a day (QID) | ORAL | 0 refills | Status: DC | PRN

## 2017-09-28 MED FILL — Thrombin For Soln 5000 Unit: CUTANEOUS | Qty: 2 | Status: AC

## 2017-09-28 NOTE — Discharge Summary (Signed)
BP 102/64 (BP Location: Right Arm)   Pulse 62   Temp 97.7 F (36.5 C) (Oral)   Resp 18   Ht 6' (1.829 m)   Wt 106.1 kg (234 lb)   SpO2 99%   BMI 31.74 kg/m  Physician Discharge Summary  Patient ID: [name] MRN: [id number] DOB/AGE: [birth year] 74 y.o.  Admit date: 09/27/2017 Discharge date: 09/28/2017  Admission Diagnoses:lumbar spinal stenosis L3/4,4/5  Discharge Diagnoses:  Active Problems:   Lumbar spinal stenosis   Discharged Condition: good  Hospital Course: Mr. Rattigan was admitted and taken to the operating room for an uncomplicated lumbar laminectomy. Post op he was ambulating well, tolerating a regular diet, and voiding.   Treatments: surgery: as above  Discharge Exam: Blood pressure 102/64, pulse 62, temperature 97.7 F (36.5 C), temperature source Oral, resp. rate 18, height 6' (1.829 m), weight 106.1 kg (234 lb), SpO2 99 %. General appearance: alert, cooperative, appears stated age and no distress Neurologic: Alert and oriented X 3, normal strength and tone. Normal symmetric reflexes. Normal coordination and gait  Disposition: Discharge disposition: 01-Home or Self Care      LUMBAR STENOSIS WITH NEUROGENIC CLAUDICATION  Allergies as of 09/28/2017      Reactions   Sulfa Drugs Cross Reactors    CHILDHOOD UNSPECIFIED REACTION    Contrast Media [iodinated Diagnostic Agents] Rash   Doxycycline Itching   Ioxaglate Rash   Tetracyclines & Related Rash      Medication List    TAKE these medications   allopurinol 300 MG tablet Commonly known as:  ZYLOPRIM Take 300 mg by mouth daily.   amLODipine 5 MG tablet Commonly known as:  NORVASC Take 1 tablet (5 mg total) by mouth daily.   atenolol-chlorthalidone 50-25 MG tablet Commonly known as:  TENORETIC Take 0.5 tablets by mouth daily.   atorvastatin 10 MG tablet Commonly known as:  LIPITOR Take 10 mg by mouth every evening.   celecoxib 200 MG capsule Commonly known as:  CELEBREX Take 200 mg by  mouth 2 (two) times daily.   ELIQUIS 5 MG Tabs tablet Generic drug:  apixaban TAKE 1 TABLET BY MOUTH TWO  TIMES DAILY   HYDROcodone-acetaminophen 5-325 MG tablet Commonly known as:  NORCO/VICODIN Take 1 tablet by mouth every 6 (six) hours as needed for moderate pain.   lisinopril 5 MG tablet Commonly known as:  PRINIVIL,ZESTRIL Take 1 tablet (5 mg total) by mouth 2 (two) times daily.   metFORMIN 1000 MG tablet Commonly known as:  GLUCOPHAGE Take 500 mg by mouth daily with breakfast.   multivitamin with minerals Tabs tablet Take 1 tablet by mouth daily. Centrum Silver   PRESERVISION AREDS 2 Caps Take 1 capsule by mouth 2 (two) times daily.   tiZANidine 4 MG tablet Commonly known as:  ZANAFLEX Take 1 tablet (4 mg total) by mouth every 6 (six) hours as needed for muscle spasms.   traMADol 50 MG tablet Commonly known as:  ULTRAM Take 50 mg by mouth 2 (two) times daily. arthritis      Follow-up Information    Ashok Pall, MD Follow up in 3 week(s).   Specialty:  Neurosurgery Why:  please call to make an appointment. Contact information: [address] [place] [address] [phone]           Signed: Winfield Cunas 09/28/2017, 3:19 PM

## 2017-09-28 NOTE — Progress Notes (Signed)
Patient alert and oriented, mae's well, voiding adequate amount of urine, swallowing without difficulty, no c/o pain at time of discharge. Patient discharged home with family. Script and discharged instructions given to patient. Patient and family stated understanding of instructions given. Patient has an appointment with Dr. Cabbell   

## 2017-10-02 ENCOUNTER — Ambulatory Visit: Payer: Medicare Other | Admitting: Nurse Practitioner

## 2017-10-05 ENCOUNTER — Telehealth: Payer: Self-pay | Admitting: *Deleted

## 2017-10-05 NOTE — Telephone Encounter (Signed)
-----   Message from Burtis Junes, NP sent at 10/05/2017  1:17 PM EDT ----- Can someone call Mr. Cruzan and just check in with him and see how he is doing this afternoon? He contacted [facility] this morning - was pretty dizzy with low BP. I had him hold his Norvasc.   He can hold the Lisinopril if needed also.  Ok to liberalize salt for a few days.   Thanks [name]

## 2017-10-05 NOTE — Telephone Encounter (Signed)
Spoke with patient who reports he held his amlodipine as instructed and is feeling much better.  He will continue to monitor his BP and contact the office if he continues to experience low BP and or dizziness.  We also discussed restarting Amlodipine if necessary should BP become elevated.  He states understanding and was grateful for the follow up.

## 2017-10-09 NOTE — Telephone Encounter (Signed)
Noted  

## 2017-10-09 NOTE — Telephone Encounter (Signed)
Pt called cell phoneon 5/17  when not at work, stated pt was dizzy and bp was 104/60, stated to call office and s/w triage.  Texted Truitt Merle, NP to [facility], stated to call pt and D/C norvasc and might have to cut more medications back due to pt just having surgery and doing better. Pt stated understanding.

## 2017-10-15 ENCOUNTER — Other Ambulatory Visit: Payer: Self-pay | Admitting: *Deleted

## 2017-10-15 ENCOUNTER — Telehealth: Payer: Self-pay | Admitting: *Deleted

## 2017-10-15 MED ORDER — LISINOPRIL 5 MG PO TABS: 5.0000 mg | ORAL_TABLET | Freq: Two times a day (BID) | ORAL | 3 refills | Status: DC

## 2017-10-15 NOTE — Telephone Encounter (Signed)
S/w pt since increase lisinopril (5 mg ) daily did not have enough medication, sent in to pt's requested pharmacy today.

## 2017-10-22 ENCOUNTER — Encounter: Payer: Self-pay | Admitting: Nurse Practitioner

## 2017-10-22 ENCOUNTER — Ambulatory Visit (INDEPENDENT_AMBULATORY_CARE_PROVIDER_SITE_OTHER): Payer: Medicare Other | Admitting: Nurse Practitioner

## 2017-10-22 VITALS — BP 120/58 | HR 63 | Ht 72.0 in | Wt 234.0 lb

## 2017-10-22 DIAGNOSIS — R911 Solitary pulmonary nodule: Secondary | ICD-10-CM

## 2017-10-22 DIAGNOSIS — Z7901 Long term (current) use of anticoagulants: Secondary | ICD-10-CM | POA: Diagnosis not present

## 2017-10-22 DIAGNOSIS — I10 Essential (primary) hypertension: Secondary | ICD-10-CM

## 2017-10-22 DIAGNOSIS — I482 Chronic atrial fibrillation, unspecified: Secondary | ICD-10-CM

## 2017-10-22 LAB — BASIC METABOLIC PANEL
BUN/Creatinine Ratio: 15 (ref 10–24)
BUN: 20 mg/dL (ref 8–27)
CO2: 24 mmol/L (ref 20–29)
Calcium: 9.3 mg/dL (ref 8.6–10.2)
Chloride: 102 mmol/L (ref 96–106)
Creatinine, Ser: 1.3 mg/dL — ABNORMAL HIGH (ref 0.76–1.27)
GFR calc Af Amer: 63 mL/min/{1.73_m2} (ref >59)
GFR calc non Af Amer: 54 mL/min/{1.73_m2} — ABNORMAL LOW (ref >59)
Glucose: 92 mg/dL (ref 65–99)
Potassium: 4.2 mmol/L (ref 3.5–5.2)
Sodium: 140 mmol/L (ref 134–144)

## 2017-10-22 NOTE — Patient Instructions (Addendum)
We will be checking the following labs today - BMET   Medication Instructions:    Continue with your current medicines.     Testing/Procedures To Be Arranged:  Follow up CT scan of the chest without contrast  Follow-Up:   See me in 6 months.     Other Special Instructions:   Keep a check on your BP for me.     If you need a refill on your cardiac medications before your next appointment, please call your pharmacy.   Call the [place] office at [phone] if you have any questions, problems or concerns.

## 2017-10-22 NOTE — Progress Notes (Signed)
CARDIOLOGY OFFICE NOTE  Date:  10/22/2017    [name] Date of Birth: 05-13-1946 Medical Record #[id number]  PCP:  Leanna Battles, MD  Cardiologist:  Servando Snare   Chief Complaint  Patient presents with  . Hypertension  . Atrial Fibrillation    Follow up visit    History of Present Illness: [name] is a 72 y.o. male who presents today for a follow up visit. Former patient of Dr. Claris Gladden andDr. Tennant's.He basically follows with me.I see his wife as well.   He has chronic atrial fibrillation that has recurred after multiple cardioversions. Hewas previously onCoumadinand then switched over to Eliquis. He had a low risk Myoview inOctoberof 2016. He also has OSA on CPAP, hypertension, hyperlipidemia, obesity. CT of chest 05/11/14 showed stable pulmonary nodules the largest measuring 6 mm with recommended repeat study in one year.  I saw him back inOctober - he was doing well. More limited by back pain and was not exercising.  I saw him back in April - lots of back pain - BP was up - probably looking at surgery. His ACE was restarted. On follow up last month - BP was still up - I added Norvasc. He was to proceed on with his surgery - he did well - did have low BP following the surgery and we stopped the Norvasc.   Comes back today. Here with his wife [name]. He is doing much better. Able to do much more. Anxious to get back to the treadmill. His prior pain is resolved -now with some low grade incisional pain. Seeing surgeon tomorrow. Not dizzy. BP diary from home looks great. No chest pain. He has had some DOE - felt to be due to inactivity. Overall, he is happy with how he is doing and has no concerns.   Past Medical History:  Diagnosis Date  . A-fib ([place])   . Arrhythmia    hx. Atrial Fib. ,"with regurgitation"  . Depression   . Diabetes mellitus   . Dysrhythmia    a. fib-on Eliquis  . Heart murmur   . Hx of echocardiogram    Echo 3/16:  Mild LVH, EF  60-65%, mild MR, severe LAE, mild RAE, PASP 32 mmHg  . Hypercholesteremia   . Hypertension   . Osteoarthritis (arthritis due to wear and tear of joints)   . Palpitations   . Prostate cancer ([place])    seed implantion- 15 to 20 yrs ago  . Sleep apnea    uses CPAP-settings 5  . Ureter, stricture    hx. 2 yrs ago-stent has been removed now    Past Surgical History:  Procedure Laterality Date  . CYSTOSCOPY W/ RETROGRADES  04/04/2012   Procedure: CYSTOSCOPY WITH RETROGRADE PYELOGRAM;  Surgeon: Molli Hazard, MD;  Location: [facility];  Service: Urology;  Laterality: Bilateral;  . CYSTOSCOPY WITH RETROGRADE PYELOGRAM, URETEROSCOPY AND STENT PLACEMENT  04/04/2012   Procedure: CYSTOSCOPY WITH RETROGRADE PYELOGRAM, URETEROSCOPY AND STENT PLACEMENT;  Surgeon: Molli Hazard, MD;  Location[facility];  Service: Urology;  Laterality: Right;  . JOINT REPLACEMENT Left   . LUMBAR LAMINECTOMY/DECOMPRESSION MICRODISCECTOMY N/A 09/27/2017   Procedure: LAMINECTOMY LUMBAR THREE- LUMBAR FOUR, LUMBAR FOUR- LUMBAR FIVE;  Surgeon: Ashok Pall, MD;  Location: [facility];  Service: Neurosurgery;  Laterality: N/A;  . ROTATOR CUFF REPAIR Left   . seed implants    . TOTAL KNEE ARTHROPLASTY Left 08/06/2014   Procedure: LEFT TOTAL KNEE ARTHROPLASTY;  Surgeon: Susa Day,  MD;  [facility];  Service: Orthopedics;  Laterality: Left;     Medications: Current Meds  Medication Sig  . allopurinol (ZYLOPRIM) 300 MG tablet Take 300 mg by mouth daily.  Marland Kitchen atenolol-chlorthalidone (TENORETIC) 50-25 MG per tablet Take 0.5 tablets by mouth daily.   Marland Kitchen atorvastatin (LIPITOR) 10 MG tablet Take 10 mg by mouth every evening.   . celecoxib (CELEBREX) 200 MG capsule Take 200 mg by mouth 2 (two) times daily.   . colchicine 0.6 MG tablet Take 1 tablet by mouth as needed. For Gout Flares  . ELIQUIS 5 MG TABS tablet TAKE 1 TABLET BY MOUTH TWO  TIMES DAILY  . lisinopril (PRINIVIL,ZESTRIL) 5 MG tablet Take 1 tablet (5 mg total)  by mouth 2 (two) times daily.  . metFORMIN (GLUCOPHAGE) 1000 MG tablet Take 500 mg by mouth daily with breakfast.  . Multiple Vitamin (MULTIVITAMIN WITH MINERALS) TABS tablet Take 1 tablet by mouth daily. Centrum Silver  . Multiple Vitamins-Minerals (PRESERVISION AREDS 2) CAPS Take 1 capsule by mouth 2 (two) times daily.   . traMADol (ULTRAM) 50 MG tablet Take 50 mg by mouth 2 (two) times daily. arthritis      Allergies: Allergies  Allergen Reactions  . Sulfa Drugs Cross Reactors     CHILDHOOD UNSPECIFIED REACTION   . Contrast Media [Iodinated Diagnostic Agents] Rash  . Doxycycline Itching  . Ioxaglate Rash  . Tetracyclines & Related Rash    Social History: The patient  reports that he quit smoking about 44 years ago. He has never used smokeless tobacco. He reports that he drinks alcohol. He reports that he does not use drugs.   Family History: The patient's family history includes Cancer in his mother; Dementia in his mother; Heart attack in his father and sister; Stroke in his sister.   Review of Systems: Please see the history of present illness.   Otherwise, the review of systems is positive for none.   All other systems are reviewed and negative.   Physical Exam: VS:  BP (!) 120/58   Pulse 63   Ht 6' (1.829 m)   Wt 234 lb (106.1 kg)   SpO2 97%   BMI 31.74 kg/m  .  BMI Body mass index is 31.74 kg/m.  Wt Readings from Last 3 Encounters:  10/22/17 234 lb (106.1 kg)  09/27/17 234 lb (106.1 kg)  09/24/17 234 lb 6.4 oz (106.3 kg)    General: Pleasant. Well developed, well nourished and in no acute distress.   HEENT: Normal.  Neck: Supple, no JVD, carotid bruits, or masses noted.  Cardiac: Irregular irregular rhythm. His rate is ok.  No edema.  Respiratory:  Lungs are clear to auscultation bilaterally with normal work of breathing.  GI: Soft and nontender.  MS: No deformity or atrophy. Gait and ROM intact.  Skin: Warm and dry. Color is normal.  Neuro:  Strength and  sensation are intact and no gross focal deficits noted.  Psych: Alert, appropriate and with normal affect.   LABORATORY DATA:  EKG:  EKG is not ordered today.  Lab Results  Component Value Date   WBC 5.1 09/18/2017   HGB 15.7 09/18/2017   HCT 44.9 09/18/2017   PLT 120 (L) 09/18/2017   GLUCOSE 106 (H) 09/18/2017   ALT 22 09/21/2013   AST 25 09/21/2013   NA 140 09/18/2017   K 4.2 09/18/2017   CL 105 09/18/2017   CREATININE 1.34 (H) 09/18/2017   BUN 22 (H) 09/18/2017  CO2 27 09/18/2017   INR 1.13 09/27/2017   HGBA1C 5.1 09/18/2017        BNP (last 3 results) No results for input(s): BNP in the last 8760 hours.  ProBNP (last 3 results) No results for input(s): PROBNP in the last 8760 hours.   Other Studies Reviewed Today:  CT CHEST IMPRESSION 07/2015: 1. Multiple small solid pulmonary nodules are unchanged from baseline examination of November 2014, consistent with benign findings. 2. There is a single ground-glass nodule in the right middle lobe which is also unchanged and likely benign. Current guidelines call for longer follow-up of such nodules until 5 years of stability has been established. Therefore, consider 1 additional examination approximately 2 years now. This recommendation follows the consensus statement: Guidelines for Management of Incidental Pulmonary Nodules Detected on CT Images:From the Fleischner Society 2017; published online before print (10.1148/radiol.5916384665). 3. Coronary artery atherosclerosis. 4. Hepatic steatosis and cholelithiasis.   Electronically Signed By: Richardean Sale M.D. On: 08/04/2015 14:26   Echo Study Conclusions from 07/2014  - Left ventricle: The cavity size was normal. Wall thickness was increased in a pattern of mild LVH. Systolic function was normal. The estimated ejection fraction was in the range of 60% to 65%. - Mitral valve: There was mild regurgitation. - Left atrium: The atrium was  severely dilated. - Right atrium: The atrium was mildly dilated. - Pulmonary arteries: PA peak pressure: 32 mm Hg (S).      Myoview Study Highlights from 02/2015    Defect 1: There is a small defect of mild severity present in the basal inferior and basal inferolateral location.  There was no ST segment deviation noted during stress.  Thinning in the inferior / inferolateral region (base) Otherwise normal perfusion. May represent soft tissue attenuation vs small region of scar Images not gated.  Overall low risk scan.      ASSESSMENT AND PLAN:  1.S/P back surgery - doing well.   2.HTN - BP is great on his current regimen. No changes made today. Will leave off Norvasc.   3. Chronic AF - managed with rate control and anticoagulation.   4. Multiple CV risk factors- low risk Myoview from October of 2016noted. He is not having chest pain. Hopefully he will be able to get back to an exercise routine.   5. HLD - on statin therapy.   6. Lung nodule- needs his follow up CT - will go ahead and arrange this today.   Current medicines are reviewed with the patient today.  The patient does not have concerns regarding medicines other than what has been noted above.  The following changes have been made:  See above.  Labs/ tests ordered today include:    Orders Placed This Encounter  Procedures  . CT CHEST NODULE FOLLOW UP LOW DOSE W/O  . Basic metabolic panel     Disposition:   FU with me in 6 months.   Patient is agreeable to this plan and will call if any problems develop in the interim.   SignedTruitt Merle, NP  10/22/2017 11:29 AM  [facility] [address] [place], [place]  [address] Phone: [phone] Fax: [phone]

## 2017-10-31 ENCOUNTER — Encounter: Payer: Self-pay | Admitting: Neurology

## 2017-10-31 ENCOUNTER — Institutional Professional Consult (permissible substitution): Payer: Self-pay | Admitting: Neurology

## 2017-11-01 ENCOUNTER — Ambulatory Visit: Payer: Medicare Other | Admitting: Neurology

## 2017-11-01 ENCOUNTER — Encounter: Payer: Self-pay | Admitting: Neurology

## 2017-11-01 VITALS — BP 139/88 | HR 59 | Ht 72.0 in | Wt 240.0 lb

## 2017-11-01 DIAGNOSIS — G4733 Obstructive sleep apnea (adult) (pediatric): Secondary | ICD-10-CM | POA: Diagnosis not present

## 2017-11-01 DIAGNOSIS — Z9989 Dependence on other enabling machines and devices: Secondary | ICD-10-CM

## 2017-11-01 DIAGNOSIS — C61 Malignant neoplasm of prostate: Secondary | ICD-10-CM

## 2017-11-01 DIAGNOSIS — M48061 Spinal stenosis, lumbar region without neurogenic claudication: Secondary | ICD-10-CM

## 2017-11-01 DIAGNOSIS — R0609 Other forms of dyspnea: Secondary | ICD-10-CM

## 2017-11-01 DIAGNOSIS — I480 Paroxysmal atrial fibrillation: Secondary | ICD-10-CM | POA: Diagnosis not present

## 2017-11-01 DIAGNOSIS — R0600 Dyspnea, unspecified: Secondary | ICD-10-CM

## 2017-11-01 NOTE — Progress Notes (Addendum)
SLEEP MEDICINE CLINIC   Provider:  Larey Seat, M D  Primary Care Physician:  Sean Battles, MD   Referring Provider: Leanna Battles, MD  Chief Complaint  Patient presents with  . New Patient (Initial Visit)    pt alone, pt had a sleep study completed in 2011 with Dr [name] and started on a CPAP at that time. pt still using the machine he started at that time but states that the machine is starting to cut off in the middle in night. DME [facility]. pt states uses religiously but thinks its time to upgrade and make sure working properly.    HPI:  [name] is a 72 y.o. male patient , seen here as a referral from [name] for a new apnea evaluation. Chief complaint according to patient : needs supplies, needs new sleep study to confirm the diagnosis.   This patient has been a compliant CPAP user ever since been diagnosed with apnea.  His supplies come by mail order through [facility] from [place].   He is now in need of a new machine as his old one tends to switch itself off in the middle of the night - it seems to lose air pressure -but the lights of the machine still stay on.  [name]  underwent a split-night polysomnography on 10 February 2010, at the time also referred by his current primary care physician, [name].  At the time he was 72 years old, had endorsed the Epworth Sleepiness Scale at 12 points, and had reached a BMI of 29.4.  He was diagnosed with an AHI of 19.9, interestingly there was less apnea in supine but in nonsupine position his REM AHI was lower than his normal REM AHI.  More concerning was a very low oxygen nadir at 79% but this was not sustained.  This degree of snoring was more than mild.  He was titrated to CPAP and there is documented REM sleep rebounding under CPAP pressure.  CPAP was initiated at 5 and step-by-step explored up to 12 cmH2O but the patient seemed to do the best at 8 cmH2O pressure which she has used since.  Access to a  recent compliance report on his C-Flex machine shows 100% compliance for the last 30 days with an average use at time of 6 hours and 35 minutes, average AHI 4.0, CPAP is still set at 8 cmH2O pressure with a ramp time of 30 minutes which is rather long.  The patient does not indicate air leaks.   Sleep habits are as follows: The patient reports that he usually watches television for about 2 hours after dinner, and then goes to bed around 9 PM.  He does not have trouble initiating sleep.  His bedroom is cool, quiet and dark.  He shares a bedroom with his wife.  On his left side with one pillow for head support and 2 pillows for body positioning. Nocturia is rare. He is not a vivid dreamer, he dreams but he does not have nightmares for example and there is no history of dream enactment.  He is not a sleep walker in adulthood but used to be in childhood.  His taps will wake him between 5 and 6 AM and he usually rises at that time for good. He averages more than 7 hours of sleep each night, he does not need to nap in daytime.  Sometimes he will wake up with a dry mouth - he uses no water in his  CPAP.  Sleep medical history and family sleep history:   Atrial fibrillation. 6 years ago diagnosed while on CPAP> he recovered from sciatica after lumbar surgery -Chief complaint according to patient : needs supplies, needs new sleep study to confirm the diagnosis.   Social history: one son, 2 grandchildren,  Widowed after 49 years of marriage. re-married 4 years ago. The second  Mrs. 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 He quit smoking in 1975 over 40 years ago.  He may drink 2 or 3 beers on Wednesday nights only. Caffeine -rarely drinks iced tea usually when he eats out, if he has breakfast in a coffee he will have a cup of coffee or 2, he does not drink caffeinated  sodas. Retired from general contracting.hobbies ; surf fishing.    Review of Systems: Out of a complete 14 system review, the patient complains of only the following symptoms, and all other reviewed systems are negative.  Epworth score 5 , Fatigue severity score 14  , depression score n/a   Social History   Socioeconomic History  . Marital status: Married    Spouse name: Not on file  . Number of children: Not on file  . Years of education: Not on file  . Highest education level: Not on file  Occupational History  . Not on file  Social Needs  . Financial resource strain: Not on file  . Food insecurity:    Worry: Not on file    Inability: Not on file  . Transportation needs:    Medical: Not on file    Non-medical: Not on file  Tobacco Use  . Smoking status: Former Smoker    Last attempt to quit: 05/15/1973    Years since quitting: 44.4  . Smokeless tobacco: Never Used  Substance and Sexual Activity  . Alcohol use: Yes    Comment: weekly beer or cocktail  . Drug use: No  . Sexual activity: Not on file  Lifestyle  . Physical activity:    Days per week: Not on file    Minutes per session: Not on file  . Stress: Not on file  Relationships  . Social connections:    Talks on phone: Not on file    Gets together: Not on file    Attends religious service: Not on file    Active member of club or organization: Not on file    Attends meetings of clubs or organizations: Not on file    Relationship status: Not on file  . Intimate partner violence:    Fear of current or ex partner: Not on file    Emotionally abused: Not on file    Physically abused: Not on file    Forced sexual activity: Not on file  Other Topics Concern  . Not on file  Social History Narrative  . Not on file    Family History  Problem Relation Age of Onset  . Cancer Mother   . Dementia Mother   . Heart attack Sister   . Stroke Sister   . Heart attack Father     Past Medical History:  Diagnosis Date   . A-fib ([place])   . Arrhythmia    hx. Atrial Fib. ,"with regurgitation"  . Depression   . Diabetes mellitus   . Dysrhythmia    a. fib-on Eliquis  .  Heart murmur   . Hx of echocardiogram    Echo 3/16:  Mild LVH, EF 60-65%, mild MR, severe LAE, mild RAE, PASP 32 mmHg  . Hypercholesteremia   . Hypertension   . OSA on CPAP   . Osteoarthritis (arthritis due to wear and tear of joints)   . Palpitations   . Prostate cancer ([place])    seed implantion- 15 to 20 yrs ago  . Sleep apnea    uses CPAP-settings 5  . Ureter, stricture    hx. 2 yrs ago-stent has been removed now    Past Surgical History:  Procedure Laterality Date  . CYSTOSCOPY W/ RETROGRADES  04/04/2012   Procedure: CYSTOSCOPY WITH RETROGRADE PYELOGRAM;  Surgeon: Molli Hazard, MD;  [facility];  Service: Urology;  Laterality: Bilateral;  . CYSTOSCOPY WITH RETROGRADE PYELOGRAM, URETEROSCOPY AND STENT PLACEMENT  04/04/2012   Procedure: CYSTOSCOPY WITH RETROGRADE PYELOGRAM, URETEROSCOPY AND STENT PLACEMENT;  Surgeon: Molli Hazard, MD;  [facility];  Service: Urology;  Laterality: Right;  . JOINT REPLACEMENT Left   . LUMBAR LAMINECTOMY/DECOMPRESSION MICRODISCECTOMY N/A 09/27/2017   Procedure: LAMINECTOMY LUMBAR THREE- LUMBAR FOUR, LUMBAR FOUR- LUMBAR FIVE;  Surgeon: Ashok Pall, MD;  Location: [facility];  Service: Neurosurgery;  Laterality: N/A;  . ROTATOR CUFF REPAIR Left   . seed implants    . TOTAL KNEE ARTHROPLASTY Left 08/06/2014   Procedure: LEFT TOTAL KNEE ARTHROPLASTY;  Surgeon: Susa Day, MD;  [facility];  Service: Orthopedics;  Laterality: Left;    Current Outpatient Medications  Medication Sig Dispense Refill  . allopurinol (ZYLOPRIM) 300 MG tablet Take 300 mg by mouth daily.    Marland Kitchen atenolol-chlorthalidone (TENORETIC) 50-25 MG per tablet Take 0.5 tablets by mouth daily.     Marland Kitchen atorvastatin (LIPITOR) 10 MG tablet Take 10 mg by mouth every evening.     . celecoxib (CELEBREX) 200 MG capsule  Take 200 mg by mouth 2 (two) times daily.     . colchicine 0.6 MG tablet Take 1 tablet by mouth as needed. For Gout Flares    . ELIQUIS 5 MG TABS tablet TAKE 1 TABLET BY MOUTH TWO  TIMES DAILY 180 tablet 2  . lisinopril (PRINIVIL,ZESTRIL) 5 MG tablet Take 1 tablet (5 mg total) by mouth 2 (two) times daily. 180 tablet 3  . metFORMIN (GLUCOPHAGE) 1000 MG tablet Take 500 mg by mouth daily with breakfast.    . Multiple Vitamin (MULTIVITAMIN WITH MINERALS) TABS tablet Take 1 tablet by mouth daily. Centrum Silver    . Multiple Vitamins-Minerals (PRESERVISION AREDS 2) CAPS Take 1 capsule by mouth 2 (two) times daily.     . traMADol (ULTRAM) 50 MG tablet Take 50 mg by mouth 2 (two) times daily. arthritis      No current facility-administered medications for this visit.     Allergies as of 11/01/2017 - Review Complete 11/01/2017  Allergen Reaction Noted  . Sulfa drugs cross reactors  09/23/2010  . Contrast media [iodinated diagnostic agents] Rash 09/21/2013  . Doxycycline Itching 03/30/2015  . Ioxaglate Rash 03/30/2015  . Tetracyclines & related Rash 09/23/2010    Vitals: BP 139/88   Pulse (!) 59   Ht 6' (1.829 m)   Wt 240 lb (108.9 kg)   BMI 32.55 kg/m  Last Weight:  Wt Readings from Last 1 Encounters:  11/01/17 240 lb (108.9 kg)   EUM:PNTI mass index is 32.55 kg/m.     Last Height:   Ht Readings from Last 1 Encounters:  11/01/17 6' (1.829 m)    Physical exam:  General: The patient is awake, alert and appears not in acute distress. The patient is well groomed. Head: Normocephalic, atraumatic. Neck is supple. Mallampati 4 neck circumference:17. 5 . Nasal airflow patent - but he has nasal septal deviation , TMJ is not evident . Retrognathia is not seen.  Dentures in place.  Cardiovascular:  Regular rate and rhythm , without  murmurs or carotid bruit, and without distended neck veins. Respiratory: Lungs are clear to auscultation. Skin:  Without evidence of edema, or rash Trunk: BMI  is 32. The patient's posture is erect   Neurologic exam : The patient is awake and alert, oriented to place and time.   Memory subjective described as intact.  Attention span & concentration ability appears normal.  Speech is fluent,  without dysarthria, dysphonia or aphasia.  Mood and affect are appropriate.  Cranial nerves: Pupils are equal and briskly reactive to light. Funduscopic exam without evidence of pallor or edema. Extraocular movements  in vertical and horizontal planes intact and without nystagmus. Visual fields by finger perimetry are intact. Hearing to finger rub impaired. Facial sensation intact to fine touch. Facial motor strength is symmetric and tongue and uvula move midline. Shoulder shrug was symmetrical.   Motor exam:  Normal tone, muscle bulk and symmetric strength in all extremities. ROM not limited , status post knee replacement.  Sensory:  Fine touch, pinprick and vibration were intact. Coordination: Rapid alternating movements in the fingers/hands was normal. Finger-to-nose maneuver  normal without evidence of ataxia, dysmetria or tremor. Gait and station: Patient walks without assistive device- Turns with 4  Steps. He reports his right knee will lock or "catch". Romberg testing is negative.  Deep tendon reflexes: in the  upper and lower extremities are symmetric and intact. Babinski maneuver response is downgoing.    Assessment:  After physical and neurologic examination, review of laboratory studies,  Personal review of imaging studies, reports of other /same  Imaging studies, results of polysomnography and / or neurophysiology testing and pre-existing records as far as provided in visit., my assessment is   1) Mr. Levee carries a diagnosis of obstructive sleep apnea, atrial fibrillation, and has been a compliant CPAP user.  In order to obtain a new machine as this 1 is broken he will need to undergo a confirmatory sleep study and will then receive a prescription  for a new CPAP machine.  The patient was diagnosed originally was rather mild apnea, and he has done very well while using CPAP he notices a difference if he cannot use CPAP.  Actually by now he is not able to sleep well without CPAP at all.  For this reason I would like his split-night polysomnography to be performed as soon as possible, AHI of 20 is certainly enough to split, if he cannot be split we will order an auto titration machine to follow.   His current mask is a Mirage FX wide nasal mask by [employer]	[place].   The patient was advised of the nature of the diagnosed disorder , the treatment options and the  risks for general health and wellness arising from not treating the condition.   I spent more than 45 minutes of face to face time with the patient.  Greater than 50% of time was spent in counseling and coordination of care. We have discussed the diagnosis and differential and I answered the patient's questions.    Plan:  Treatment plan and additional workup :  SPlit night at AHI 20, if he can't sleep  Please -put CPAP on and re-titrate instead.     Larey Seat, MD 8/97/9150, 41:36 AM  Certified in Neurology by ABPN Certified in [place] by [facility] [address] [place], [place] [address]

## 2017-11-07 ENCOUNTER — Ambulatory Visit (INDEPENDENT_AMBULATORY_CARE_PROVIDER_SITE_OTHER)
Admission: RE | Admit: 2017-11-07 | Discharge: 2017-11-07 | Disposition: A | Discharge status: Home / Self Care | Payer: Medicare Other | Source: Ambulatory Visit | Attending: Nurse Practitioner | Admitting: Nurse Practitioner

## 2017-11-07 DIAGNOSIS — R911 Solitary pulmonary nodule: Secondary | ICD-10-CM | POA: Diagnosis not present

## 2017-12-04 ENCOUNTER — Ambulatory Visit (INDEPENDENT_AMBULATORY_CARE_PROVIDER_SITE_OTHER): Payer: Medicare Other | Admitting: Neurology

## 2017-12-04 DIAGNOSIS — G4730 Sleep apnea, unspecified: Secondary | ICD-10-CM

## 2017-12-04 DIAGNOSIS — G4733 Obstructive sleep apnea (adult) (pediatric): Secondary | ICD-10-CM

## 2017-12-04 DIAGNOSIS — R0609 Other forms of dyspnea: Secondary | ICD-10-CM

## 2017-12-04 DIAGNOSIS — C61 Malignant neoplasm of prostate: Secondary | ICD-10-CM

## 2017-12-04 DIAGNOSIS — R0600 Dyspnea, unspecified: Secondary | ICD-10-CM

## 2017-12-04 DIAGNOSIS — G4731 Primary central sleep apnea: Secondary | ICD-10-CM

## 2017-12-04 DIAGNOSIS — Z9989 Dependence on other enabling machines and devices: Secondary | ICD-10-CM

## 2017-12-04 DIAGNOSIS — I480 Paroxysmal atrial fibrillation: Secondary | ICD-10-CM

## 2017-12-08 NOTE — Procedures (Signed)
PATIENT'S NAME:  [name], [name] DOB:      1946/03/21      Sean#:    [id number]     DATE OF RECORDING: 12/04/2017 REFERRING M.D.:  Leanna Battles, MD Study Performed:  Split-Night Titration Study HISTORY:  [name] is a 72 y.o. male patient, seen here as a referral from [name] for a new apnea evaluation. Chief complaint according to patient: He needs PAP supplies, needs new sleep study to confirm the apnea diagnosis. This patient has been a highly compliant CPAP user ever since previous split-night polysomnography on 10 February 2010, at the time also referred by Dr. Leanna Battles.  He was diagnosed with an AHI of 19.9/h, interestingly there was less apnea in supine but in non-supine position, his REM AHI was lower than his normal REM AHI. CPAP was initiated at 5 and explored up to 12 cmH2O - the patient did well at 8 cmH2O pressure which he has used since. Access to a recent compliance report on his C-Flex machine shows 100% compliance for the last 30 days with an average use at time of 6 hours and 35 minutes, average AHI 4.0, CPAP is still set at 8 cmH2O pressure.   A-fib, Arrhythmia, Depression, Diabetes, Dysrhythmia, Heart murmur, Hypocholesteremia, Hypertension, OSA, Osteoarthritis, Palpitations, Prostate cancer,  The patient endorsed the Epworth Sleepiness Scale at 5/24 points on CPAP.   The patient's weight 240 pounds with a height of 72 (inches), resulting in a BMI of 32.5 kg/m2. The patient's neck circumference measured 17.5 inches.  CURRENT MEDICATIONS: Zyloprim, Tenoretic, Lipitor, Celebrex, Colchicine, Eliques, Prinivil, Glucophage, Multivitamin, Ultram.   PROCEDURE:  This is a multichannel digital polysomnogram utilizing the Somnostar 11.2 system.  Electrodes and sensors were applied and monitored per AASM Specifications.   EEG, EOG, Chin and Limb EMG, were sampled at 200 Hz.  ECG, Snore and Nasal Pressure, Thermal Airflow, Respiratory Effort, CPAP Flow and Pressure, Oximetry was  sampled at 50 Hz. Digital video and audio were recorded.      BASELINE STUDY WITHOUT CPAP RESULTS: Lights Out was at 21:07 and Lights On at 04:47.  Total recording time (TRT) was 148, with a total sleep time (TST) of 127.5 minutes.   The patient's sleep latency was 27 minutes.  REM latency was 87 minutes.  The sleep efficiency was 86.1 %.    SLEEP ARCHITECTURE: WASO (Wake after sleep onset) was 9 minutes, Stage N1 was 5 minutes, Stage N2 was 80.5 minutes, Stage N3 was 30 minutes and Stage R (REM sleep) was 12 minutes.  The percentages were Stage N1 3.9%, Stage N2 63.1%, Stage N3 23.5% and Stage R (REM sleep) 9.4%.   RESPIRATORY ANALYSIS:  There were 82 respiratory events:  26 obstructive apneas, 3 central apneas and 46 mixed apneas with 7 hypopneas and 0 respiratory event related arousals (RERAs).  Snoring was noted.    The total APNEA/HYPOPNEA INDEX (AHI) was 38.6 /hour and the total RESPIRATORY DISTURBANCE INDEX was 38.6 /hour. 9 events occurred in REM sleep and 52 events in NREM. The REM AHI was 45.0 /hour versus a non-REM AHI of 37.9 /hour. The patient spent 161.5 minutes sleep time in the supine position 247 minutes in non-supine. The supine AHI was 39.7 /hour versus a non-supine AHI of 0.0 /hour.  OXYGEN SATURATION & C02:  The wake baseline 02 saturation was 96%, with the lowest being 78%. Time spent below 89% saturation equaled 59 minutes.  PERIODIC LIMB MOVEMENTS: The patient had a total of 5  Periodic Limb Movements.  The Periodic Limb Movement (PLM) index was 2.4 /hour and the PLM Arousal index was 2.4 /hour. The arousals were noted as: 15 were spontaneous, 5 were associated with PLMs, and 55 were associated with respiratory events.  EKG was highly irregular, no p wave noted.    TITRATION STUDY WITH CPAP RESULTS:   CPAP was initiated at 5 cmH20 with heated humidity per AASM split night standards and pressure was advanced to 6 cmH20 because of hypopneas, apneas and desaturations.  At a PAP  pressure of 7 cmH20, there was a reduction of the AHI to 0.0 /hour.  Total recording time (TRT) was 313 minutes, with a total sleep time (TST) of 281 minutes. The patient's sleep latency was 4 minutes. REM latency was 37 minutes.  The sleep efficiency was 89.8 %.    SLEEP ARCHITECTURE: Wake after sleep was 14.5 minutes, Stage N1 3 minutes, Stage N2 60 minutes, Stage N3 117 minutes and Stage R (REM sleep) 101 minutes. The percentages were: Stage N1 1.1%, Stage N2 21.4%, Stage N3 41.6% and Stage R (REM sleep) 35.9%. The sleep architecture was notable for REM rebound.RESPIRATORY ANALYSIS:  There were a total of 19 respiratory events: 1 obstructive apnea, 18 central apnea and 0 mixed apneas with 0 hypopneas. The patient also had 0 respiratory event related arousals (RERAs). The total APNEA/HYPOPNEA INDEX (AHI) was 4.1 /hour and the total RESPIRATORY DISTURBANCE INDEX was 4.1 /hour.  0 events occurred in REM sleep and 19 events in NREM. The REM AHI was 0.0 /hour versus a non-REM AHI of 6.3 /hour. The patient spent 13% of total sleep time in the supine position. The supine AHI was 0.0 /hour, versus a non-supine AHI of 4.7/hour.  OXYGEN SATURATION & C02:  The wake baseline 02 saturation was 94%, with the lowest being 91%. Time spent below 89% saturation equaled 0 minutes.  PERIODIC LIMB MOVEMENTS:  The patient had a total of 20 Periodic Limb Movements. The Periodic Limb Movement (PLM) index was 4.3 /hour and the PLM Arousal index was 0.4 /hour. The arousals were noted as: 25 were spontaneous, 2 were associated with PLMs, and 0 were associated with respiratory events. The patient was fitted with a mask by [employer], a Mirage FX FF mask, but the size was not noted.   POLYSOMNOGRAPHY IMPRESSION :   1. Severe Complex sleep apnea, mostly mixed apneas- and accentuated in REM sleep to an AHI of 45.0/h and supine AHI 39.7/h.   2. Atrial fibrillation- see screen shot of EKG. 3. Exquisite response to CPAP at only 7 cm  water pressure with complete resolution of apnea.   RECOMMENDATIONS: [name] clearly qualifies for continued PAP therapy and will be issued an auto titration capable machine with a setting of 6 cm water, Ramp of 12 minutes, no EPR and heated humidity, under a FFM by [employer], a Mirage FX model.  Post-study, the patient indicated that sleep with CPAP was the same restful sleep as usual.  A follow up appointment will be scheduled in the Sleep Clinic at [facility].      I certify that I have reviewed the entire raw data recording prior to the issuance of this report in accordance with the Standards of Accreditation of the American Academy of Sleep Medicine (AASM)    Larey Seat, M.D.   12-08-2017 Diplomat, American Board of Psychiatry and Neurology  Diplomat, [place] [facility] Medical Director, [place] [facility]

## 2017-12-08 NOTE — Addendum Note (Signed)
Addended by: Larey Seat on: 12/08/2017 06:45 PM   Modules accepted: Orders

## 2017-12-10 ENCOUNTER — Telehealth: Payer: Self-pay | Admitting: Neurology

## 2017-12-10 NOTE — Telephone Encounter (Signed)
-----   Message from Larey Seat, MD sent at 12/08/2017  6:45 PM EDT ----- POLYSOMNOGRAPHY IMPRESSION :   1. Severe Complex sleep apnea, mostly mixed apneas- and  accentuated in REM sleep to an AHI of 45.0/h and supine AHI 39.7/h.  2. Atrial fibrillation- see screen shot of EKG. 3. Exquisite response to CPAP at only 7 cm water pressure with complete resolution of apnea.   RECOMMENDATIONS: [name] clearly qualifies for continued PAP  therapy and will be issued an auto titration capable machine with  a setting of 6 cm water, Ramp of 12 minutes, no EPR and heated  humidity, under a FFM by [employer], a Mirage FX model.

## 2017-12-10 NOTE — Telephone Encounter (Signed)
Called patient to discuss sleep study results. No answer at this time. LVM for the patient to call back.   

## 2017-12-12 NOTE — Telephone Encounter (Signed)
I called pt. I advised pt that Dr. Brett Fairy reviewed their sleep study results and found that pt the patient has severe sleep apnea. Dr. Brett Fairy recommends that pt starts a CPAP set at pressure of 6 cm water pressure. I reviewed PAP compliance expectations with the pt. Pt is agreeable to starting a CPAP. I advised pt that an order will be sent to a DME, [facility], and [facility] will call the pt within about one week after they file with the pt's insurance. [facility] will show the pt how to use the machine, fit for masks, and troubleshoot the CPAP if needed. A follow up appt was made for insurance purposes with Dr. Brett Fairy on Nov 7,2019. Pt verbalized understanding to arrive 15 minutes early and bring their CPAP. A letter with all of this information in it will be mailed to the pt as a reminder. I verified with the pt that the address we have on file is correct. Pt verbalized understanding of results. Pt had no questions at this time but was encouraged to call back if questions arise.

## 2018-01-15 ENCOUNTER — Other Ambulatory Visit: Payer: Self-pay | Admitting: Nurse Practitioner

## 2018-01-15 MED ORDER — LISINOPRIL 5 MG PO TABS: 5.0000 mg | ORAL_TABLET | Freq: Two times a day (BID) | ORAL | 2 refills | Status: DC

## 2018-01-19 ENCOUNTER — Other Ambulatory Visit: Payer: Self-pay | Admitting: Cardiology

## 2018-01-22 ENCOUNTER — Other Ambulatory Visit: Payer: Self-pay | Admitting: *Deleted

## 2018-01-22 MED ORDER — APIXABAN 5 MG PO TABS: 5.0000 mg | ORAL_TABLET | Freq: Two times a day (BID) | ORAL | 3 refills | Status: DC

## 2018-01-22 NOTE — Telephone Encounter (Signed)
Pt saw Truitt Merle on 10/22/17, wt on that visit was 108.9Kg. Last SCr was 1.30 on 10/22/17. Will refill Eliquis 5mg  BID.

## 2018-01-22 NOTE — Telephone Encounter (Signed)
[facility] left a msg on the refill vm stating that a refill request was sent to our office but was denied with a reason of not a patient of Dr Aundra Dubin. They would like a call back at [phone], provide ref# [id number] or [id number] (not sure which is correct as agent repeated reference numbers do not match). They are requesting to authenticate if rx that they have been refilling is a valid rx. Thanks, MI

## 2018-03-20 ENCOUNTER — Ambulatory Visit: Payer: Self-pay | Admitting: Nurse Practitioner

## 2018-03-20 ENCOUNTER — Encounter: Payer: Self-pay | Admitting: Nurse Practitioner

## 2018-03-20 NOTE — Progress Notes (Signed)
[facility]  PATIENT: [name] DOB: [birth year]   REASON FOR VISIT: Follow-up for obstructive sleep apnea with  CPAP HISTORY FROM: Patient    HISTORY OF PRESENT ILLNESS:UPDATE 11/7/2019CM [name], 72 year old male returns for follow-up with history of obstructive sleep apnea.  He recently obtained a new CPAP machine and is here for compliance.  He also reports that he had back surgery in May of this past year with resolution of his pain.  He has had obstructive sleep apnea for 9 years.  He denies any daytime drowsiness.  Compliance data dated 02/19/2018-03/20/2018 shows compliance greater than 4 hours at 83% for 25 days.  Average usage 6 hours 32 minutes set pressure 6 cm EPR level 1 no leak AHI 1.6.  Patient had a respiratory infection and used it less than 4 hours the other 5 days for total usage of 100%.  ESS 4 he returns for reevaluation   11/01/17 [name] is a 71 y.o. male patient , seen here as a referral from [name] for a new apnea evaluation. Chief complaint according to patient : needs supplies, needs new sleep study to confirm the diagnosis.   This patient has been a compliant CPAP user ever since been diagnosed with apnea.  His supplies come by mail order through [employer] from [place].   He is now in need of a new machine as his old one tends to switch itself off in the middle of the night - it seems to lose air pressure -but the lights of the machine still stay on.  [name]  underwent a split-night polysomnography on 10 February 2010, at the time also referred by his current primary care physician, Dr. Bevelyn Buckles.  At the time he was 71 years old, had endorsed the Epworth Sleepiness Scale at 12 points, and had reached a BMI of 29.4.  He was diagnosed with an AHI of 19.9, interestingly there was less apnea in supine but in nonsupine position his REM AHI was lower than his normal REM AHI.  More concerning was a very low oxygen nadir at 79%  but this was not sustained.  This degree of snoring was more than mild.  He was titrated to CPAP and there is documented REM sleep rebounding under CPAP pressure.  CPAP was initiated at 5 and step-by-step explored up to 12 cmH2O but the patient seemed to do the best at 8 cmH2O pressure which she has used since.  Access to a recent compliance report on his C-Flex machine shows 100% compliance for the last 30 days with an average use at time of 6 hours and 35 minutes, average AHI 4.0, CPAP is still set at 8 cmH2O pressure with a ramp time of 30 minutes which is rather long.  The patient does not indicate air leaks.   Sleep habits are as follows: The patient reports that he usually watches television for about 2 hours after dinner, and then goes to bed around 9 PM.  He does not have trouble initiating sleep.  His bedroom is cool, quiet and dark.  He shares a bedroom with his wife.  On his left side with one pillow for head support and 2 pillows for body positioning. Nocturia is rare. He is not a vivid dreamer, he dreams but he does not have nightmares for example and there is no history of dream enactment.  He is not a sleep walker in adulthood but used to be in childhood.  His taps  will wake him between 5 and 6 AM and he usually rises at that time for good. He averages more than 7 hours of sleep each night, he does not need to nap in daytime.  Sometimes he will wake up with a dry mouth - he uses no water in his CPAP.   REVIEW OF SYSTEMS: Full 14 system review of systems performed and notable only for those listed, all others are neg:  Constitutional: neg  Cardiovascular: neg Ear/Nose/Throat: neg  Skin: neg Eyes: neg Respiratory: neg Gastroitestinal: neg  Hematology/Lymphatic: neg  Endocrine: neg Musculoskeletal:neg Allergy/Immunology: neg Neurological: neg Psychiatric: neg Sleep : Obstructive sleep apnea with CPAP  ALLERGIES: Allergies  Allergen Reactions  . Sulfa Drugs Cross Reactors      CHILDHOOD UNSPECIFIED REACTION   . Contrast Media [Iodinated Diagnostic Agents] Rash  . Doxycycline Itching  . Ioxaglate Rash  . Tetracyclines & Related Rash    HOME MEDICATIONS: Outpatient Medications Prior to Visit  Medication Sig Dispense Refill  . allopurinol (ZYLOPRIM) 300 MG tablet Take 300 mg by mouth daily.    Marland Kitchen apixaban (ELIQUIS) 5 MG TABS tablet Take 1 tablet (5 mg total) by mouth 2 (two) times daily. 180 tablet 3  . atenolol-chlorthalidone (TENORETIC) 50-25 MG per tablet Take 0.5 tablets by mouth daily.     Marland Kitchen atorvastatin (LIPITOR) 10 MG tablet Take 10 mg by mouth every evening.     . celecoxib (CELEBREX) 200 MG capsule Take 200 mg by mouth 2 (two) times daily.     . colchicine 0.6 MG tablet Take 1 tablet by mouth as needed. For Gout Flares    . lisinopril (PRINIVIL,ZESTRIL) 5 MG tablet Take 1 tablet (5 mg total) by mouth 2 (two) times daily. 180 tablet 2  . metFORMIN (GLUCOPHAGE) 1000 MG tablet Take 500 mg by mouth daily with breakfast.    . Multiple Vitamin (MULTIVITAMIN WITH MINERALS) TABS tablet Take 1 tablet by mouth daily. Centrum Silver    . Multiple Vitamins-Minerals (PRESERVISION AREDS 2) CAPS Take 1 capsule by mouth 2 (two) times daily.     . traMADol (ULTRAM) 50 MG tablet Take 50 mg by mouth 2 (two) times daily. arthritis      No facility-administered medications prior to visit.     PAST MEDICAL HISTORY: Past Medical History:  Diagnosis Date  . A-fib ([place])   . Arrhythmia    hx. Atrial Fib. ,"with regurgitation"  . Depression   . Diabetes mellitus   . Dysrhythmia    a. fib-on Eliquis  . Heart murmur   . Hx of echocardiogram    Echo 3/16:  Mild LVH, EF 60-65%, mild MR, severe LAE, mild RAE, PASP 32 mmHg  . Hypercholesteremia   . Hypertension   . Macular degeneration of both eyes   . OSA on CPAP   . Osteoarthritis (arthritis due to wear and tear of joints)   . Palpitations   . Prostate cancer ([place])    seed implantion- 15 to 20 yrs ago  . Sleep  apnea    uses CPAP-settings 5  . Ureter, stricture    hx. 2 yrs ago-stent has been removed now    PAST SURGICAL HISTORY: Past Surgical History:  Procedure Laterality Date  . CYSTOSCOPY W/ RETROGRADES  04/04/2012   Procedure: CYSTOSCOPY WITH RETROGRADE PYELOGRAM;  Surgeon: Molli Hazard, MD;  Location: [facility];  Service: Urology;  Laterality: Bilateral;  . CYSTOSCOPY WITH RETROGRADE PYELOGRAM, URETEROSCOPY AND STENT PLACEMENT  04/04/2012   Procedure: CYSTOSCOPY WITH  RETROGRADE PYELOGRAM, URETEROSCOPY AND STENT PLACEMENT;  Surgeon: Molli Hazard, MD;  Location: [facility];  Service: Urology;  Laterality: Right;  . JOINT REPLACEMENT Left   . LUMBAR LAMINECTOMY/DECOMPRESSION MICRODISCECTOMY N/A 09/27/2017   Procedure: LAMINECTOMY LUMBAR THREE- LUMBAR FOUR, LUMBAR FOUR- LUMBAR FIVE;  Surgeon: Ashok Pall, MD;  Location: [place];  Service: Neurosurgery;  Laterality: N/A;  . ROTATOR CUFF REPAIR Left   . seed implants    . TOTAL KNEE ARTHROPLASTY Left 08/06/2014   Procedure: LEFT TOTAL KNEE ARTHROPLASTY;  Surgeon: Susa Day, MD;  Location: [facility];  Service: Orthopedics;  Laterality: Left;    FAMILY HISTORY: Family History  Problem Relation Age of Onset  . Cancer Mother   . Dementia Mother   . Heart attack Sister   . Stroke Sister   . Heart attack Father     SOCIAL HISTORY: Social History   Socioeconomic History  . Marital status: Married    Spouse name: Not on file  . Number of children: Not on file  . Years of education: Not on file  . Highest education level: Not on file  Occupational History  . Not on file  Social Needs  . Financial resource strain: Not on file  . Food insecurity:    Worry: Not on file    Inability: Not on file  . Transportation needs:    Medical: Not on file    Non-medical: Not on file  Tobacco Use  . Smoking status: Former Smoker    Last attempt to quit: 05/15/1973    Years since quitting: 44.8  . Smokeless tobacco: Never Used    Substance and Sexual Activity  . Alcohol use: Yes    Comment: weekly beer or cocktail  . Drug use: No  . Sexual activity: Not on file  Lifestyle  . Physical activity:    Days per week: Not on file    Minutes per session: Not on file  . Stress: Not on file  Relationships  . Social connections:    Talks on phone: Not on file    Gets together: Not on file    Attends religious service: Not on file    Active member of club or organization: Not on file    Attends meetings of clubs or organizations: Not on file    Relationship status: Not on file  . Intimate partner violence:    Fear of current or ex partner: Not on file    Emotionally abused: Not on file    Physically abused: Not on file    Forced sexual activity: Not on file  Other Topics Concern  . Not on file  Social History Narrative  . Not on file     PHYSICAL EXAM  Vitals:   03/21/18 0749  BP: 133/74  Pulse: 65  Weight: 233 lb 9.6 oz (106 kg)  Height: 6' (1.829 m)   Body mass index is 31.68 kg/m.  Generalized: Well developed, obese male in no acute distress  Head: normocephalic and atraumatic,. Oropharynx benign mallopatti4 Neck: Supple, circumference 17.5 Lungs clear  Musculoskeletal: No deformity  Skin no rash or edema Neurological examination   Mentation: Alert oriented to time, place, history taking. Attention span and concentration appropriate. Recent and remote memory intact.  Follows all commands speech and language fluent.   Cranial nerve II-XII: Pupils were equal round reactive to light extraocular movements were full, visual field were full on confrontational test. Facial sensation and strength were normal. hearing was intact to finger  rubbing bilaterally. Uvula tongue midline. head turning and shoulder shrug were normal and symmetric.Tongue protrusion into cheek strength was normal. Motor: normal bulk and tone, full strength in the BUE, BLE,  Sensory: normal and symmetric to light touch,   Coordination: finger-nose-finger, heel-to-shin bilaterally, no dysmetria Gait and Station: Rising up from seated position without assistance, normal stance,  moderate stride, good arm swing, smooth turning, able to perform tiptoe, and heel walking without difficulty. Tandem gait is steady  DIAGNOSTIC DATA (LABS, IMAGING, TESTING) - I reviewed patient records, labs, notes, testing and imaging myself where available.  Lab Results  Component Value Date   WBC 5.1 09/18/2017   HGB 15.7 09/18/2017   HCT 44.9 09/18/2017   MCV 91.8 09/18/2017   PLT 120 (L) 09/18/2017      Component Value Date/Time   NA 140 10/22/2017 1142   K 4.2 10/22/2017 1142   CL 102 10/22/2017 1142   CO2 24 10/22/2017 1142   GLUCOSE 92 10/22/2017 1142   GLUCOSE 106 (H) 09/18/2017 0851   BUN 20 10/22/2017 1142   CREATININE 1.30 (H) 10/22/2017 1142   CREATININE 1.38 (H) 02/08/2016 0857   CALCIUM 9.3 10/22/2017 1142   PROT 7.2 09/21/2013 1748   ALBUMIN 4.2 09/21/2013 1748   AST 25 09/21/2013 1748   ALT 22 09/21/2013 1748   ALKPHOS 57 09/21/2013 1748   BILITOT 0.7 09/21/2013 1748   GFRNONAA 54 (L) 10/22/2017 1142   GFRAA 63 10/22/2017 1142    Lab Results  Component Value Date   HGBA1C 5.1 09/18/2017    ASSESSMENT AND PLAN  72 y.o. year old male  has a past medical history of A-fib ([place]), Arrhythmia, Depression, Diabetes mellitus, Dysrhythmia, Heart murmur, echocardiogram, Hypercholesteremia, Hypertension, OSA on CPAP, Osteoarthritis (arthritis due to wear and tear of joints), Palpitations, Prostate cancer ([place]),  Here to follow-up for his obstructive sleep apnea with CPAP compliance.  Data dated 02/19/2018-03/20/2018 shows compliance greater than 4 hours at 83% for 25 days.  Average usage 6 hours 32 minutes set pressure 6 cm EPR level 1 no leak AHI 1.6.  Patient had a respiratory infection and used it less than 4 hours the other 5 days for total usage of 100%.  ESS4   CPAP compliance 83% Continue same  settings Follow-up yearly and as needed Dennie Bible, [facility], [facility], APRN  [facility] [address] [place], [place] [address] [phone]

## 2018-03-21 ENCOUNTER — Ambulatory Visit: Payer: Self-pay | Admitting: Neurology

## 2018-03-21 ENCOUNTER — Ambulatory Visit: Payer: Medicare Other | Admitting: Nurse Practitioner

## 2018-03-21 ENCOUNTER — Encounter: Payer: Self-pay | Admitting: Nurse Practitioner

## 2018-03-21 VITALS — BP 133/74 | HR 65 | Ht 72.0 in | Wt 233.6 lb

## 2018-03-21 DIAGNOSIS — Z9989 Dependence on other enabling machines and devices: Secondary | ICD-10-CM

## 2018-03-21 DIAGNOSIS — G4733 Obstructive sleep apnea (adult) (pediatric): Secondary | ICD-10-CM | POA: Diagnosis not present

## 2018-03-21 NOTE — Patient Instructions (Signed)
CPAP compliance 83% Continue same settings Follow-up yearly and as needed's

## 2018-04-17 ENCOUNTER — Encounter: Payer: Self-pay | Admitting: Nurse Practitioner

## 2018-04-22 NOTE — Progress Notes (Deleted)
CARDIOLOGY OFFICE NOTE  Date:  04/23/2018    [name] Date of Birth: 02-01-1946 Medical Record #[id number]  PCP:  Leanna Battles, MD  Cardiologist:  Servando Snare & ***    No chief complaint on file.   History of Present Illness: [name] is a 72 y.o. male who presents today for a ***   Comes in today. Here with   Past Medical History:  Diagnosis Date  . A-fib ([place])   . Arrhythmia    hx. Atrial Fib. ,"with regurgitation"  . Depression   . Diabetes mellitus   . Dysrhythmia    a. fib-on Eliquis  . Heart murmur   . Hx of echocardiogram    Echo 3/16:  Mild LVH, EF 60-65%, mild MR, severe LAE, mild RAE, PASP 32 mmHg  . Hypercholesteremia   . Hypertension   . Macular degeneration of both eyes   . OSA on CPAP   . Osteoarthritis (arthritis due to wear and tear of joints)   . Palpitations   . Prostate cancer ([place])    seed implantion- 15 to 20 yrs ago  . Sleep apnea    uses CPAP-settings 5  . Ureter, stricture    hx. 2 yrs ago-stent has been removed now    Past Surgical History:  Procedure Laterality Date  . CYSTOSCOPY W/ RETROGRADES  04/04/2012   Procedure: CYSTOSCOPY WITH RETROGRADE PYELOGRAM;  Surgeon: Molli Hazard, MD;  [facility]: [facility];  Service: Urology;  Laterality: Bilateral;  . CYSTOSCOPY WITH RETROGRADE PYELOGRAM, URETEROSCOPY AND STENT PLACEMENT  04/04/2012   Procedure: CYSTOSCOPY WITH RETROGRADE PYELOGRAM, URETEROSCOPY AND STENT PLACEMENT;  Surgeon: Molli Hazard, MD;  [facility];  Service: Urology;  Laterality: Right;  . JOINT REPLACEMENT Left   . LUMBAR LAMINECTOMY/DECOMPRESSION MICRODISCECTOMY N/A 09/27/2017   Procedure: LAMINECTOMY LUMBAR THREE- LUMBAR FOUR, LUMBAR FOUR- LUMBAR FIVE;  Surgeon: Ashok Pall, MD;  Location: [facility];  Service: Neurosurgery;  Laterality: N/A;  . ROTATOR CUFF REPAIR Left   . seed implants    . TOTAL KNEE ARTHROPLASTY Left 08/06/2014   Procedure: LEFT TOTAL KNEE ARTHROPLASTY;  Surgeon: Susa Day, MD;  [facility];  Service: Orthopedics;  Laterality: Left;     Medications: No outpatient medications have been marked as taking for the 04/23/18 encounter (Appointment) with Burtis Junes, NP.     Allergies: Allergies  Allergen Reactions  . Sulfa Drugs Cross Reactors     CHILDHOOD UNSPECIFIED REACTION   . Contrast Media [Iodinated Diagnostic Agents] Rash  . Doxycycline Itching  . Ioxaglate Rash  . Tetracyclines & Related Rash    Social History: The patient  reports that he quit smoking about 44 years ago. He has never used smokeless tobacco. He reports that he drinks alcohol. He reports that he does not use drugs.   Family History: The patient's ***family history includes Cancer in his mother; Dementia in his mother; Heart attack in his father and sister; Stroke in his sister.   Review of Systems: Please see the history of present illness.   Otherwise, the review of systems is positive for {NONE DEFAULTED:18576::"none"}.   All other systems are reviewed and negative.   Physical Exam: VS:  There were no vitals taken for this visit. Marland Kitchen  BMI There is no height or weight on file to calculate BMI.  Wt Readings from Last 3 Encounters:  03/21/18 233 lb 9.6 oz (106 kg)  11/01/17 240 lb (108.9 kg)  10/22/17 234 lb (  106.1 kg)    General: Pleasant. Well developed, well nourished and in no acute distress.   HEENT: Normal.  Neck: Supple, no JVD, carotid bruits, or masses noted.  Cardiac: ***Regular rate and rhythm. No murmurs, rubs, or gallops. No edema.  Respiratory:  Lungs are clear to auscultation bilaterally with normal work of breathing.  GI: Soft and nontender.  MS: No deformity or atrophy. Gait and ROM intact.  Skin: Warm and dry. Color is normal.  Neuro:  Strength and sensation are intact and no gross focal deficits noted.  Psych: Alert, appropriate and with normal affect.   LABORATORY DATA:  EKG:  EKG {ACTION; IS/IS ERX:[id number]} ordered today. This  demonstrates ***.  Lab Results  Component Value Date   WBC 5.1 09/18/2017   HGB 15.7 09/18/2017   HCT 44.9 09/18/2017   PLT 120 (L) 09/18/2017   GLUCOSE 92 10/22/2017   ALT 22 09/21/2013   AST 25 09/21/2013   NA 140 10/22/2017   K 4.2 10/22/2017   CL 102 10/22/2017   CREATININE 1.30 (H) 10/22/2017   BUN 20 10/22/2017   CO2 24 10/22/2017   INR 1.13 09/27/2017   HGBA1C 5.1 09/18/2017     BNP (last 3 results) No results for input(s): BNP in the last 8760 hours.  ProBNP (last 3 results) No results for input(s): PROBNP in the last 8760 hours.   Other Studies Reviewed Today:   Assessment/Plan:   Current medicines are reviewed with the patient today.  The patient does not have concerns regarding medicines other than what has been noted above.  The following changes have been made:  See above.  Labs/ tests ordered today include:   No orders of the defined types were placed in this encounter.    Disposition:   FU with *** in {gen number 1-95:093267} {Days to years:10300}.   Patient is agreeable to this plan and will call if any problems develop in the interim.   SignedTruitt Merle, NP  04/23/2018 7:43 AM  [facility] [address] [place], [place]  [address] Phone: [phone] Fax: [phone]           CARDIOLOGY OFFICE NOTE  Date:  04/22/2018    [name] Date of Birth: [birth year] Medical Record #[id number]  PCP:  Leanna Battles, MD  Cardiologist:  Servando Snare & ***    No chief complaint on file.   History of Present Illness: [name] is a 71 y.o. male who presents today for a ***   Comes in today. Here with   Past Medical History:  Diagnosis Date  . A-fib ([place])   . Arrhythmia    hx. Atrial Fib. ,"with regurgitation"  . Depression   . Diabetes mellitus   . Dysrhythmia    a. fib-on Eliquis  . Heart murmur   . Hx of echocardiogram    Echo 3/16:  Mild LVH, EF 60-65%, mild MR,  severe LAE, mild RAE, PASP 32 mmHg  . Hypercholesteremia   . Hypertension   . Macular degeneration of both eyes   . OSA on CPAP   . Osteoarthritis (arthritis due to wear and tear of joints)   . Palpitations   . Prostate cancer ([place])    seed implantion- 15 to 20 yrs ago  . Sleep apnea    uses CPAP-settings 5  . Ureter, stricture    hx. 2 yrs ago-stent has been removed now    Past Surgical History:  Procedure Laterality Date  . CYSTOSCOPY W/ RETROGRADES  04/04/2012   Procedure: CYSTOSCOPY WITH RETROGRADE PYELOGRAM;  Surgeon: Molli Hazard, MD;  [facility];  Service: Urology;  Laterality: Bilateral;  . CYSTOSCOPY WITH RETROGRADE PYELOGRAM, URETEROSCOPY AND STENT PLACEMENT  04/04/2012   Procedure: CYSTOSCOPY WITH RETROGRADE PYELOGRAM, URETEROSCOPY AND STENT PLACEMENT;  Surgeon: Molli Hazard, MD;  [facility];  Service: Urology;  Laterality: Right;  . JOINT REPLACEMENT Left   . LUMBAR LAMINECTOMY/DECOMPRESSION MICRODISCECTOMY N/A 09/27/2017   Procedure: LAMINECTOMY LUMBAR THREE- LUMBAR FOUR, LUMBAR FOUR- LUMBAR FIVE;  Surgeon: Ashok Pall, MD;  Location: [facility];  Service: Neurosurgery;  Laterality: N/A;  . ROTATOR CUFF REPAIR Left   . seed implants    . TOTAL KNEE ARTHROPLASTY Left 08/06/2014   Procedure: LEFT TOTAL KNEE ARTHROPLASTY;  Surgeon: Susa Day, MD;  [facility];  Service: Orthopedics;  Laterality: Left;     Medications: No outpatient medications have been marked as taking for the 04/23/18 encounter (Appointment) with Burtis Junes, NP.     Allergies: Allergies  Allergen Reactions  . Sulfa Drugs Cross Reactors     CHILDHOOD UNSPECIFIED REACTION   . Contrast Media [Iodinated Diagnostic Agents] Rash  . Doxycycline Itching  . Ioxaglate Rash  . Tetracyclines & Related Rash    Social History: The patient  reports that he quit smoking about 44 years ago. He has never used smokeless tobacco. He reports that he drinks alcohol. He  reports that he does not use drugs.   Family History: The patient's ***family history includes Cancer in his mother; Dementia in his mother; Heart attack in his father and sister; Stroke in his sister.   Review of Systems: Please see the history of present illness.   Otherwise, the review of systems is positive for {NONE DEFAULTED:18576::"none"}.   All other systems are reviewed and negative.   Physical Exam: VS:  There were no vitals taken for this visit. Marland Kitchen  BMI There is no height or weight on file to calculate BMI.  Wt Readings from Last 3 Encounters:  03/21/18 233 lb 9.6 oz (106 kg)  11/01/17 240 lb (108.9 kg)  10/22/17 234 lb (106.1 kg)    General: Pleasant. Well developed, well nourished and in no acute distress.   HEENT: Normal.  Neck: Supple, no JVD, carotid bruits, or masses noted.  Cardiac: ***Regular rate and rhythm. No murmurs, rubs, or gallops. No edema.  Respiratory:  Lungs are clear to auscultation bilaterally with normal work of breathing.  GI: Soft and nontender.  MS: No deformity or atrophy. Gait and ROM intact.  Skin: Warm and dry. Color is normal.  Neuro:  Strength and sensation are intact and no gross focal deficits noted.  Psych: Alert, appropriate and with normal affect.   LABORATORY DATA:  EKG:  EKG {ACTION; IS/IS TML:[id number]} ordered today. This demonstrates ***.  Lab Results  Component Value Date   WBC 5.1 09/18/2017   HGB 15.7 09/18/2017   HCT 44.9 09/18/2017   PLT 120 (L) 09/18/2017   GLUCOSE 92 10/22/2017   ALT 22 09/21/2013   AST 25 09/21/2013   NA 140 10/22/2017   K 4.2 10/22/2017   CL 102 10/22/2017   CREATININE 1.30 (H) 10/22/2017   BUN 20 10/22/2017   CO2 24 10/22/2017   INR 1.13 09/27/2017   HGBA1C 5.1 09/18/2017     BNP (last 3 results) No results for input(s): BNP in the last 8760 hours.  ProBNP (last 3 results) No  results for input(s): PROBNP in the last 8760 hours.   Other Studies Reviewed  Today:   Assessment/Plan:   Current medicines are reviewed with the patient today.  The patient does not have concerns regarding medicines other than what has been noted above.  The following changes have been made:  See above.  Labs/ tests ordered today include:   No orders of the defined types were placed in this encounter.    Disposition:   FU with *** in {gen number 3-73:081683} {Days to years:10300}.   Patient is agreeable to this plan and will call if any problems develop in the interim.   SignedTruitt Merle, NP  04/22/2018 4:33 PM  [facility] [address] [place], [place]  [address] Phone: [phone] Fax: [phone]

## 2018-04-23 ENCOUNTER — Ambulatory Visit: Payer: Medicare Other | Admitting: Nurse Practitioner

## 2018-04-23 ENCOUNTER — Encounter: Payer: Self-pay | Admitting: Nurse Practitioner

## 2018-04-23 VITALS — BP 118/78 | HR 62 | Ht 72.0 in | Wt 230.4 lb

## 2018-04-23 DIAGNOSIS — E785 Hyperlipidemia, unspecified: Secondary | ICD-10-CM | POA: Diagnosis not present

## 2018-04-23 DIAGNOSIS — Z7901 Long term (current) use of anticoagulants: Secondary | ICD-10-CM

## 2018-04-23 DIAGNOSIS — I10 Essential (primary) hypertension: Secondary | ICD-10-CM | POA: Diagnosis not present

## 2018-04-23 DIAGNOSIS — I482 Chronic atrial fibrillation, unspecified: Secondary | ICD-10-CM

## 2018-04-23 NOTE — Progress Notes (Signed)
CARDIOLOGY OFFICE NOTE  Date:  04/23/2018    [name] Date of Birth: 27-Jul-1945 Medical Record #[id number]  PCP:  Leanna Battles, MD  Cardiologist:  Servando Snare    Chief Complaint  Patient presents with  . Atrial Fibrillation    6 month check.     History of Present Illness: CHAUNCY MANGIARACINA is a 72 y.o. male who presents today for a 6 month check. Former patient of Dr. Claris Gladden andDr. Tennant's.He basically follows with me.I see his wife as well.   He has chronic atrial fibrillation that has recurred after multiple cardioversions. Hewas previously onCoumadinand then switched over to Eliquis. He had a low risk Myoview inOctoberof 2016. He has had coronary calcifications noted on prior CT scans. He also has OSA on CPAP, hypertension, hyperlipidemia, obesity. Last CT of the chest from 10/2017 - to repeat in 2 years.   I have followed him over the past several years. He had more issues with back pain with associated HTN - we adjusted his medicines. He had his back fixed - BP came down and we were able to titrate down and even stop some of his antihypertensives. Last seen back in June - he was doing very well.   Comes back today. Here alone. He feels pretty good. Back walking some. He is eating less. He is down 10 pounds - he is a little surprised about this but clearly more active than last year when he was having his back issues. No chest pain. Breathing is good. BP looks good. Not dizzy. No falls. Recent labs from his PCP noted and are stable. Overall, felt to be doing well with no real concerns.   Past Medical History:  Diagnosis Date  . A-fib ([place])   . Arrhythmia    hx. Atrial Fib. ,"with regurgitation"  . Depression   . Diabetes mellitus   . Dysrhythmia    a. fib-on Eliquis  . Heart murmur   . Hx of echocardiogram    Echo 3/16:  Mild LVH, EF 60-65%, mild MR, severe LAE, mild RAE, PASP 32 mmHg  . Hypercholesteremia   . Hypertension   . Macular degeneration of  both eyes   . OSA on CPAP   . Osteoarthritis (arthritis due to wear and tear of joints)   . Palpitations   . Prostate cancer ([place])    seed implantion- 15 to 20 yrs ago  . Sleep apnea    uses CPAP-settings 5  . Ureter, stricture    hx. 2 yrs ago-stent has been removed now    Past Surgical History:  Procedure Laterality Date  . CYSTOSCOPY W/ RETROGRADES  04/04/2012   Procedure: CYSTOSCOPY WITH RETROGRADE PYELOGRAM;  Surgeon: Molli Hazard, MD;  [facility];  Service: Urology;  Laterality: Bilateral;  . CYSTOSCOPY WITH RETROGRADE PYELOGRAM, URETEROSCOPY AND STENT PLACEMENT  04/04/2012   Procedure: CYSTOSCOPY WITH RETROGRADE PYELOGRAM, URETEROSCOPY AND STENT PLACEMENT;  Surgeon: Molli Hazard, MD;  [facility];  Service: Urology;  Laterality: Right;  . JOINT REPLACEMENT Left   . LUMBAR LAMINECTOMY/DECOMPRESSION MICRODISCECTOMY N/A 09/27/2017   Procedure: LAMINECTOMY LUMBAR THREE- LUMBAR FOUR, LUMBAR FOUR- LUMBAR FIVE;  Surgeon: Ashok Pall, MD;  Location: [facility];  Service: Neurosurgery;  Laterality: N/A;  . ROTATOR CUFF REPAIR Left   . seed implants    . TOTAL KNEE ARTHROPLASTY Left 08/06/2014   Procedure: LEFT TOTAL KNEE ARTHROPLASTY;  Surgeon: Susa Day, MD;  [facility];  Service: Orthopedics;  Laterality:  Left;     Medications: Current Meds  Medication Sig  . allopurinol (ZYLOPRIM) 300 MG tablet Take 300 mg by mouth daily.  Marland Kitchen apixaban (ELIQUIS) 5 MG TABS tablet Take 1 tablet (5 mg total) by mouth 2 (two) times daily.  Marland Kitchen atenolol-chlorthalidone (TENORETIC) 50-25 MG per tablet Take 0.5 tablets by mouth daily.   Marland Kitchen atorvastatin (LIPITOR) 10 MG tablet Take 10 mg by mouth every evening.   . celecoxib (CELEBREX) 200 MG capsule Take 200 mg by mouth 2 (two) times daily.   . colchicine 0.6 MG tablet Take 1 tablet by mouth as needed. For Gout Flares  . lisinopril (PRINIVIL,ZESTRIL) 5 MG tablet Take 1 tablet (5 mg total) by mouth 2 (two) times daily.  .  metFORMIN (GLUCOPHAGE) 1000 MG tablet Take 500 mg by mouth daily with breakfast.  . Multiple Vitamin (MULTIVITAMIN WITH MINERALS) TABS tablet Take 1 tablet by mouth daily. Centrum Silver  . Multiple Vitamins-Minerals (PRESERVISION AREDS 2) CAPS Take 1 capsule by mouth 2 (two) times daily.   . traMADol (ULTRAM) 50 MG tablet Take 50 mg by mouth 2 (two) times daily. arthritis      Allergies: Allergies  Allergen Reactions  . Sulfa Drugs Cross Reactors     CHILDHOOD UNSPECIFIED REACTION   . Contrast Media [Iodinated Diagnostic Agents] Rash  . Doxycycline Itching  . Ioxaglate Rash  . Tetracyclines & Related Rash    Social History: The patient  reports that he quit smoking about 44 years ago. He has never used smokeless tobacco. He reports that he drinks alcohol. He reports that he does not use drugs.   Family History: The patient's family history includes Cancer in his mother; Dementia in his mother; Heart attack in his father and sister; Stroke in his sister.   Review of Systems: Please see the history of present illness.   Otherwise, the review of systems is positive for none.   All other systems are reviewed and negative.   Physical Exam: VS:  BP 118/78   Pulse 62   Ht 6' (1.829 m)   Wt 230 lb 6.4 oz (104.5 kg)   SpO2 96%   BMI 31.25 kg/m  .  BMI Body mass index is 31.25 kg/m.  Wt Readings from Last 3 Encounters:  04/23/18 230 lb 6.4 oz (104.5 kg)  03/21/18 233 lb 9.6 oz (106 kg)  11/01/17 240 lb (108.9 kg)    General: Pleasant. Well developed, well nourished and in no acute distress. His weight is down 10#   HEENT: Normal.  Neck: Supple, no JVD, carotid bruits, or masses noted.  Cardiac: Irregular rhythm. Rate is ok. No edema.  Respiratory:  Lungs are clear to auscultation bilaterally with normal work of breathing.  GI: Soft and nontender.  MS: No deformity or atrophy. Gait and ROM intact.  Skin: Warm and dry. Color is normal.  Neuro:  Strength and sensation are  intact and no gross focal deficits noted.  Psych: Alert, appropriate and with normal affect.   LABORATORY DATA:  EKG:  EKG is not ordered today.  Lab Results  Component Value Date   WBC 5.1 09/18/2017   HGB 15.7 09/18/2017   HCT 44.9 09/18/2017   PLT 120 (L) 09/18/2017   GLUCOSE 92 10/22/2017   ALT 22 09/21/2013   AST 25 09/21/2013   NA 140 10/22/2017   K 4.2 10/22/2017   CL 102 10/22/2017   CREATININE 1.30 (H) 10/22/2017   BUN 20 10/22/2017   CO2 24 10/22/2017  INR 1.13 09/27/2017   HGBA1C 5.1 09/18/2017       BNP (last 3 results) No results for input(s): BNP in the last 8760 hours.  ProBNP (last 3 results) No results for input(s): PROBNP in the last 8760 hours.   Other Studies Reviewed Today:  CT CHEST 10/2017 IMPRESSION: Stable ground-glass nodular density is noted in right middle lobe. Unenhanced CT scan in 2 years is recommended to ensure stability. This recommendation follows the consensus statement: Guidelines for Management of Incidental Pulmonary Nodules Detected on CT Images: From the Fleischner Society 2017; Radiology 2017; 284:228-243.  Coronary artery calcifications are noted suggesting coronary artery disease.  Stable other bilateral pulmonary nodules are noted which can be considered benign at this point.  Cholelithiasis.  Aortic Atherosclerosis (ICD10-I70.0).   Electronically Signed   By: Marijo Conception, M.D.   On: 11/07/2017 14:36   Echo Study Conclusions from 07/2014  - Left ventricle: The cavity size was normal. Wall thickness was increased in a pattern of mild LVH. Systolic function was normal. The estimated ejection fraction was in the range of 60% to 65%. - Mitral valve: There was mild regurgitation. - Left atrium: The atrium was severely dilated. - Right atrium: The atrium was mildly dilated. - Pulmonary arteries: PA peak pressure: 32 mm Hg (S).      Myoview Study Highlights from 02/2015    Defect 1:  There is a small defect of mild severity present in the basal inferior and basal inferolateral location.  There was no ST segment deviation noted during stress.  Thinning in the inferior / inferolateral region (base) Otherwise normal perfusion. May represent soft tissue attenuation vs small region of scar Images not gated.  Overall low risk scan.      ASSESSMENT AND PLAN:  1.Permanent AF - managed with rate control and anticoagulation with Eliquis - doing well clinically.   2. HTN - BP is great - no changes made today  3. Obesity - weight is down 10#  4. Coronary calcifications on prior CT - CV risk factor modification encouraged.   5. HLD - on statin - recent labs look great  6. Prior back surgery - some recurrent back pain but nothing debilitating as he had earlier this year. Activity encourged.   7. Lung nodule - will need repeat CT in 10/2019   Current medicines are reviewed with the patient today.  The patient does not have concerns regarding medicines other than what has been noted above.  The following changes have been made:  See above.  Labs/ tests ordered today include:   No orders of the defined types were placed in this encounter.    Disposition:   FU with me in 6 months.   Patient is agreeable to this plan and will call if any problems develop in the interim.   SignedTruitt Merle, NP  04/23/2018 8:47 AM  [facility] [address] [place], [place]  [address] Phone: [phone] Fax: [phone]

## 2018-04-23 NOTE — Patient Instructions (Addendum)
We will be checking the following labs today - NONE   Medication Instructions:    Continue with your current medicines.    If you need a refill on your cardiac medications before your next appointment, please call your pharmacy.     Testing/Procedures To Be Arranged:  N/A  Follow-Up:   See me in 6 months    At [facility], you and your health needs are our priority.  As part of our continuing mission to provide you with exceptional heart care, we have created designated Provider Care Teams.  These Care Teams include your primary Cardiologist (physician) and Advanced Practice Providers (APPs -  Physician Assistants and Nurse Practitioners) who all work together to provide you with the care you need, when you need it.  Special Instructions:  . Keep up the good work!  Call the [place] office at [phone] if you have any questions, problems or concerns.

## 2018-04-24 ENCOUNTER — Telehealth: Payer: Self-pay

## 2018-04-24 ENCOUNTER — Encounter: Payer: Self-pay | Admitting: Nurse Practitioner

## 2018-04-24 ENCOUNTER — Ambulatory Visit: Payer: Medicare Other | Admitting: Nurse Practitioner

## 2018-04-24 VITALS — BP 130/80 | HR 76 | Ht 72.0 in | Wt 233.2 lb

## 2018-04-24 DIAGNOSIS — R195 Other fecal abnormalities: Secondary | ICD-10-CM | POA: Diagnosis not present

## 2018-04-24 DIAGNOSIS — Z7901 Long term (current) use of anticoagulants: Secondary | ICD-10-CM

## 2018-04-24 MED ORDER — NA SULFATE-K SULFATE-MG SULF 17.5-3.13-1.6 GM/177ML PO SOLN: ORAL | 0 refills | Status: DC

## 2018-04-24 NOTE — Patient Instructions (Addendum)
If you are age 74 or older, your body mass index should be between 23-30. Your Body mass index is 31.63 kg/m. If this is out of the aforementioned range listed, please consider follow up with your Primary Care Provider.  If you are age 91 or younger, your body mass index should be between 19-25. Your Body mass index is 31.63 kg/m. If this is out of the aformentioned range listed, please consider follow up with your Primary Care Provider.   You have been scheduled for a colonoscopy. Please follow written instructions given to you at your visit today.  Please pick up your prep supplies at the pharmacy within the next 1-3 days. If you use inhalers (even only as needed), please bring them with you on the day of your procedure. Your physician has requested that you go to www.startemmi.com and enter the access code given to you at your visit today. This web site gives a general overview about your procedure. However, you should still follow specific instructions given to you by our office regarding your preparation for the procedure.  We have sent the following medications to your pharmacy for you to pick up at your convenience: [place] will be contacted by our office prior to your procedure for directions on holding your Eliquis.  If you do not hear from our office 1 week prior to your scheduled procedure, please call [phone] to discuss.  Thank you for choosing me and [facility].   Tye Savoy, NP

## 2018-04-24 NOTE — Telephone Encounter (Signed)
[facility] Pre-operative Risk Assessment     Request for surgical clearance:     Endoscopy Procedure  What type of surgery is being performed?     Colonoscopy  When is this surgery scheduled?     05/02/18  What type of clearance is required ?   Pharmacy  Are there any medications that need to be held prior to surgery and how long? Eliquis HOLD for 2 days prior  Practice name and name of physician performing surgery?       Gastroenterology/Dr. Silverio Decamp  What is your office phone and fax number?      Phone- [phone]  [phone]  Anesthesia type (None, local, MAC, general) ?       MAC

## 2018-04-24 NOTE — Progress Notes (Signed)
ASSESSMENT / PLAN:   35.  72 year old male referred for positive IFOBT.  He has not had a colonoscopy since 2007.  No overt GI bleeding, bowel changes, or other concerning symptoms.  -Patient will be scheduled for colonoscopy off Eliquis. The risks and benefits of colonoscopy with possible polypectomy were discussed and the patient agrees to proceed.   2.  Atrial fibrillation, maintained on Eliquis.  -Hold Eliquis for 2 days before procedure - will instruct when and how to resume after procedure. Patient understands that there is a low but real risk of cardiovascular event such as heart attack, stroke, or embolism /  thrombosis while off blood thinner. The patient consents to proceed. Will communicate by phone or EMR with patient's prescribing provider to confirm that holding Eliquis is reasonable in this case.    HPI:    Chief Complaint:   Positive IFOBT Patient is a 72 year old male with atrial fibrillation, on Eliquis.  He has a history of sleep apnea, DM 2, hypertension, prostate cancer and gout . Patient referred by PCP, Dr. Leanna Battles, for positive IFOBT.  Patient had a colonoscopy by Dr. Howell Rucks in 2007.  Indications for the exam included malignant neoplasm of GI tract in mother.  Patient tells me he is not clear but his mother did have some sort of malignancy "around" her colon but it sounds like diagnosis came when she was in her 58s.  The colonoscopy was technically difficult and complex due to significant looping, the quality the prep was good.  Findings included a few sigmoid diverticula, exam otherwise unremarkable.  Patient has not had any overt GI bleeding.  No bowel changes, abdominal pain, or significant weight loss.  Data Reviewed:  Patient says he had annual physical recently, labs were normal   Past Medical History:  Diagnosis Date  . A-fib ([place])   . Arrhythmia    hx. Atrial Fib. ,"with regurgitation"  . Depression   . Diabetes mellitus     . Dysrhythmia    a. fib-on Eliquis  . Heart murmur   . Hx of echocardiogram    Echo 3/16:  Mild LVH, EF 60-65%, mild MR, severe LAE, mild RAE, PASP 32 mmHg  . Hypercholesteremia   . Hypertension   . Macular degeneration of both eyes   . OSA on CPAP   . Osteoarthritis (arthritis due to wear and tear of joints)   . Palpitations   . Prostate cancer ([place])    seed implantion- 15 to 20 yrs ago  . Sleep apnea    uses CPAP-settings 5  . Ureter, stricture    hx. 2 yrs ago-stent has been removed now     Past Surgical History:  Procedure Laterality Date  . COLONOSCOPY  2007  . CYSTOSCOPY W/ RETROGRADES  04/04/2012   Procedure: CYSTOSCOPY WITH RETROGRADE PYELOGRAM;  Surgeon: Molli Hazard, MD;  Location: [facility];  Service: Urology;  Laterality: Bilateral;  . CYSTOSCOPY WITH RETROGRADE PYELOGRAM, URETEROSCOPY AND STENT PLACEMENT  04/04/2012   Procedure: CYSTOSCOPY WITH RETROGRADE PYELOGRAM, URETEROSCOPY AND STENT PLACEMENT;  Surgeon: Molli Hazard, MD;  Location: [facility];  Service: Urology;  Laterality: Right;  . JOINT REPLACEMENT Left   . LUMBAR LAMINECTOMY/DECOMPRESSION MICRODISCECTOMY N/A 09/27/2017   Procedure: LAMINECTOMY LUMBAR THREE- LUMBAR FOUR, LUMBAR FOUR- LUMBAR FIVE;  Surgeon: Ashok Pall, MD;  Location: [facility];  Service: Neurosurgery;  Laterality: N/A;  . ROTATOR CUFF  REPAIR Left   . seed implants    . TOTAL KNEE ARTHROPLASTY Left 08/06/2014   Procedure: LEFT TOTAL KNEE ARTHROPLASTY;  Surgeon: Susa Day, MD;  Location: [facility];  Service: Orthopedics;  Laterality: Left;   Family History  Problem Relation Age of Onset  . Cancer Mother   . Dementia Mother   . Heart attack Sister   . Stroke Sister   . Heart attack Father    Social History   Tobacco Use  . Smoking status: Former Smoker    Last attempt to quit: 05/15/1973    Years since quitting: 44.9  . Smokeless tobacco: Never Used  Substance Use Topics  . Alcohol use: Yes    Comment: weekly beer or  cocktail  . Drug use: No   Current Outpatient Medications  Medication Sig Dispense Refill  . allopurinol (ZYLOPRIM) 300 MG tablet Take 300 mg by mouth daily.    Marland Kitchen apixaban (ELIQUIS) 5 MG TABS tablet Take 1 tablet (5 mg total) by mouth 2 (two) times daily. 180 tablet 3  . atenolol-chlorthalidone (TENORETIC) 50-25 MG per tablet Take 0.5 tablets by mouth daily.     Marland Kitchen atorvastatin (LIPITOR) 10 MG tablet Take 10 mg by mouth every evening.     . celecoxib (CELEBREX) 200 MG capsule Take 200 mg by mouth 2 (two) times daily.     . colchicine 0.6 MG tablet Take 1 tablet by mouth as needed. For Gout Flares    . lisinopril (PRINIVIL,ZESTRIL) 5 MG tablet Take 1 tablet (5 mg total) by mouth 2 (two) times daily. 180 tablet 2  . metFORMIN (GLUCOPHAGE) 1000 MG tablet Take 500 mg by mouth daily with breakfast.    . Multiple Vitamin (MULTIVITAMIN WITH MINERALS) TABS tablet Take 1 tablet by mouth daily. Centrum Silver    . Multiple Vitamins-Minerals (PRESERVISION AREDS 2) CAPS Take 1 capsule by mouth 2 (two) times daily.     . traMADol (ULTRAM) 50 MG tablet Take 50 mg by mouth 2 (two) times daily. arthritis      No current facility-administered medications for this visit.    Allergies  Allergen Reactions  . Sulfa Drugs Cross Reactors     CHILDHOOD UNSPECIFIED REACTION   . Contrast Media [Iodinated Diagnostic Agents] Rash  . Doxycycline Itching  . Ioxaglate Rash  . Tetracyclines & Related Rash     Review of Systems: All systems reviewed and negative except where noted in HPI.   Creatinine clearance cannot be calculated (Patient's most recent lab result is older than the maximum 21 days allowed.)   Physical Exam:    Wt Readings from Last 3 Encounters:  04/24/18 233 lb 3.2 oz (105.8 kg)  04/23/18 230 lb 6.4 oz (104.5 kg)  03/21/18 233 lb 9.6 oz (106 kg)    BP 130/80   Pulse 76   Ht 6' (1.829 m)   Wt 233 lb 3.2 oz (105.8 kg)   SpO2 97%   BMI 31.63 kg/m  Constitutional:  Pleasant male in no  acute distress. Psychiatric: Normal mood and affect. Behavior is normal. EENT: Pupils normal.  Conjunctivae are normal. No scleral icterus. Neck supple.  Cardiovascular: Normal rate, regular rhythm. No edema Pulmonary/chest: Effort normal and breath sounds normal. No wheezing, rales or rhonchi. Abdominal: Soft, nondistended, nontender. Bowel sounds active throughout. There are no masses palpable. No hepatomegaly. Neurological: Alert and oriented to person place and time. Skin: Skin is warm and dry. No rashes noted.  Tye Savoy, NP  04/24/2018, 2:54 PM  Cc: Leanna Battles, MD

## 2018-04-26 NOTE — Telephone Encounter (Signed)
Patient with diagnosis of Afib on Eliquis for anticoagulation.    Procedure: colonoscopy Date of procedure: 05/02/18  CHADS2-VASc score of  4 (CHF, HTN, AGE, DM2, stroke/tia x 2, CAD, AGE, male)  CrCl 43ml/min  Per office protocol, patient can hold Eliquis for 1-2 days prior to procedure.

## 2018-04-26 NOTE — Telephone Encounter (Signed)
Pharm please address Eliquis 

## 2018-04-29 ENCOUNTER — Encounter: Payer: Medicare Other | Admitting: Gastroenterology

## 2018-04-29 NOTE — Telephone Encounter (Signed)
See attached

## 2018-04-29 NOTE — Telephone Encounter (Signed)
Spoke with patient this morning.  Patient advised to hold Eliquis 2 days prior to procedure.  Pt. Verbalized understanding.

## 2018-05-02 ENCOUNTER — Encounter: Payer: Self-pay | Admitting: Gastroenterology

## 2018-05-02 ENCOUNTER — Ambulatory Visit (AMBULATORY_SURGERY_CENTER): Payer: Medicare Other | Admitting: Gastroenterology

## 2018-05-02 VITALS — BP 102/81 | HR 55 | Temp 98.4°F | Resp 11 | Ht 72.0 in | Wt 233.0 lb

## 2018-05-02 DIAGNOSIS — D123 Benign neoplasm of transverse colon: Secondary | ICD-10-CM | POA: Diagnosis not present

## 2018-05-02 DIAGNOSIS — D127 Benign neoplasm of rectosigmoid junction: Secondary | ICD-10-CM

## 2018-05-02 DIAGNOSIS — K635 Polyp of colon: Secondary | ICD-10-CM

## 2018-05-02 DIAGNOSIS — D125 Benign neoplasm of sigmoid colon: Secondary | ICD-10-CM

## 2018-05-02 DIAGNOSIS — D120 Benign neoplasm of cecum: Secondary | ICD-10-CM

## 2018-05-02 DIAGNOSIS — R195 Other fecal abnormalities: Secondary | ICD-10-CM

## 2018-05-02 MED ORDER — SODIUM CHLORIDE 0.9 % IV SOLN: 500.0000 mL | Freq: Once | INTRAVENOUS | Status: DC

## 2018-05-02 NOTE — Progress Notes (Signed)
To PACU, VSS. Report to [email or web address].tb 

## 2018-05-02 NOTE — Op Note (Signed)
[facility] Patient Name: [name] Procedure Date: 05/02/2018 8:04 AM MRN: [id number] Endoscopist: Mauri Pole , MD Age: 72 Referring MD:  Date of Birth: 10-25-1945 Gender: Male Account #: [id number] Procedure:                Colonoscopy Indications:              Positive Cologuard test Medicines:                Monitored Anesthesia Care Procedure:                After obtaining informed consent, the colonoscope                            was passed under direct vision. Throughout the                            procedure, the patient's blood pressure, pulse, and                            oxygen saturations were monitored continuously. The                            Colonoscope was introduced through the anus and                            advanced to the the cecum, identified by                            appendiceal orifice and ileocecal valve. The                            colonoscopy was performed without difficulty. The                            patient tolerated the procedure well. The quality                            of the bowel preparation was good. The ileocecal                            valve, appendiceal orifice, and rectum were                            photographed. Scope In: 8:07:02 AM Scope Out: 8:31:37 AM Scope Withdrawal Time: 0 hours 17 minutes 48 seconds  Total Procedure Duration: 0 hours 24 minutes 35 seconds  Findings:                 The perianal and digital rectal examinations were                            normal.                           A 2 mm polyp was found in the ileocecal valve. The  polyp was sessile. The polyp was removed with a                            cold biopsy forceps. Resection and retrieval were                            complete.                           Three sessile polyps were found in the                            recto-sigmoid colon, sigmoid colon and transverse               colon. The polyps were 4 to 7 mm in size. These                            polyps were removed with a cold snare. Resection                            and retrieval were complete.                           Scattered small and large-mouthed diverticula were                            found in the sigmoid colon, descending colon,                            transverse colon and ascending colon.                           Non-bleeding internal hemorrhoids were found during                            retroflexion. The hemorrhoids were medium-sized. Complications:            No immediate complications. Impression:               - One 2 mm polyp at the ileocecal valve, removed                            with a cold biopsy forceps. Resected and retrieved.                           - Three 4 to 7 mm polyps at the recto-sigmoid                            colon, in the sigmoid colon and in the transverse                            colon, removed with a cold snare. Resected and                            retrieved.                           -  Moderate diverticulosis in the sigmoid colon, in                            the descending colon, in the transverse colon and                            in the ascending colon.                           - Non-bleeding internal hemorrhoids. Recommendation:           - Patient has a contact number available for                            emergencies. The signs and symptoms of potential                            delayed complications were discussed with the                            patient. Return to normal activities tomorrow.                            Written discharge instructions were provided to the                            patient.                           - Resume previous diet.                           - Continue present medications.                           - Await pathology results.                           - Repeat colonoscopy in 3 -  5 years for                            surveillance based on pathology results. Mauri Pole, MD 05/02/2018 8:37:46 AM This report has been signed electronically.

## 2018-05-02 NOTE — Patient Instructions (Signed)
YOU HAD AN ENDOSCOPIC PROCEDURE TODAY AT THE [facility]:   Refer to the procedure report that was given to you for any specific questions about what was found during the examination.  If the procedure report does not answer your questions, please call your gastroenterologist to clarify.  If you requested that your care partner not be given the details of your procedure findings, then the procedure report has been included in a sealed envelope for you to review at your convenience later.  YOU SHOULD EXPECT: Some feelings of bloating in the abdomen. Passage of more gas than usual.  Walking can help get rid of the air that was put into your GI tract during the procedure and reduce the bloating. If you had a lower endoscopy (such as a colonoscopy or flexible sigmoidoscopy) you may notice spotting of blood in your stool or on the toilet paper. If you underwent a bowel prep for your procedure, you may not have a normal bowel movement for a few days.  Please Note:  You might notice some irritation and congestion in your nose or some drainage.  This is from the oxygen used during your procedure.  There is no need for concern and it should clear up in a day or so.  SYMPTOMS TO REPORT IMMEDIATELY:   Following lower endoscopy (colonoscopy or flexible sigmoidoscopy):  Excessive amounts of blood in the stool  Significant tenderness or worsening of abdominal pains  Swelling of the abdomen that is new, acute  Fever of 100F or higher   Following upper endoscopy (EGD)  Vomiting of blood or coffee ground material  New chest pain or pain under the shoulder blades  Painful or persistently difficult swallowing  New shortness of breath  Fever of 100F or higher  Black, tarry-looking stools  For urgent or emergent issues, a gastroenterologist can be reached at any hour by calling [phone].   DIET:  We do recommend a small meal at first, but then you may proceed to your regular diet.  Drink  plenty of fluids but you should avoid alcoholic beverages for 24 hours.  ACTIVITY:  You should plan to take it easy for the rest of today and you should NOT DRIVE or use heavy machinery until tomorrow (because of the sedation medicines used during the test).    FOLLOW UP: Our staff will call the number listed on your records the next business day following your procedure to check on you and address any questions or concerns that you may have regarding the information given to you following your procedure. If we do not reach you, we will leave a message.  However, if you are feeling well and you are not experiencing any problems, there is no need to return our call.  We will assume that you have returned to your regular daily activities without incident.  If any biopsies were taken you will be contacted by phone or by letter within the next 1-3 weeks.  Please call us at [phone] if you have not heard about the biopsies in 3 weeks.    SIGNATURES/CONFIDENTIALITY: You and/or your care partner have signed paperwork which will be entered into your electronic medical record.  These signatures attest to the fact that that the information above on your After Visit Summary has been reviewed and is understood.  Full responsibility of the confidentiality of this discharge information lies with you and/or your care-partner. 

## 2018-05-02 NOTE — Progress Notes (Signed)
Called to room to assist during endoscopic procedure.  Patient ID and intended procedure confirmed with present staff. Received instructions for my participation in the procedure from the performing physician.  

## 2018-05-03 ENCOUNTER — Telehealth: Payer: Self-pay

## 2018-05-03 NOTE — Telephone Encounter (Signed)
  Follow up Call-  Call back number 05/02/2018  Post procedure Call Back phone  # [phone]  Permission to leave phone message Yes  Some recent data might be hidden     Patient questions:  Do you have a fever, pain , or abdominal swelling? No. Pain Score  0 *  Have you tolerated food without any problems? Yes.    Have you been able to return to your normal activities? Yes.    Do you have any questions about your discharge instructions: Diet   No. Medications  No. Follow up visit  No.  Do you have questions or concerns about your Care? No.  Actions: * If pain score is 4 or above: No action needed, pain <4.

## 2018-05-13 ENCOUNTER — Encounter: Payer: Self-pay | Admitting: Gastroenterology

## 2018-09-01 ENCOUNTER — Other Ambulatory Visit: Payer: Self-pay | Admitting: Nurse Practitioner

## 2018-10-17 ENCOUNTER — Telehealth: Payer: Self-pay | Admitting: *Deleted

## 2018-10-17 NOTE — Telephone Encounter (Signed)
Virtual Visit Pre-Appointment Phone Call  "(Name), I am calling you today to discuss your upcoming appointment. We are currently trying to limit exposure to the virus that causes COVID-19 by seeing patients at home rather than in the office."  1. "What is the BEST phone number to call the day of the visit?" - include this in appointment notes  2. "Do you have or have access to (through a family member/friend) a smartphone with video capability that we can use for your visit?" a. If yes - list this number in appt notes as "cell" (if different from BEST phone #) and list the appointment type as a VIDEO visit in appointment notes b. If no - list the appointment type as a PHONE visit in appointment notes  3. Confirm consent - "In the setting of the current Covid19 crisis, you are scheduled for a (phone or video) visit with your provider on (Wednesday, June 10) at (8:30 am).  Just as we do with many in-office visits, in order for you to participate in this visit, we must obtain consent.  If you'd like, I can send this to your mychart (if signed up) or email for you to review.  Otherwise, I can obtain your verbal consent now.  All virtual visits are billed to your insurance company just like a normal visit would be.  By agreeing to a virtual visit, we'd like you to understand that the technology does not allow for your provider to perform an examination, and thus may limit your provider's ability to fully assess your condition. If your provider identifies any concerns that need to be evaluated in person, we will make arrangements to do so.  Finally, though the technology is pretty good, we cannot assure that it will always work on either your or our end, and in the setting of a video visit, we may have to convert it to a phone-only visit.  In either situation, we cannot ensure that we have a secure connection.  Are you willing to proceed?" STAFF: Did the patient verbally acknowledge consent to telehealth  visit? Document YES/NO here: YES.  4. Advise patient to be prepared - "Two hours prior to your appointment, go ahead and check your blood pressure, pulse, oxygen saturation, and your weight (if you have the equipment to check those) and write them all down. When your visit starts, your provider will ask you for this information. If you have an Apple Watch or Kardia device, please plan to have heart rate information ready on the day of your appointment. Please have a pen and paper handy nearby the day of the visit as well."  5. Give patient instructions for MyChart download to smartphone OR Doximity/Doxy.me as below if video visit (depending on what platform provider is using)  6. Inform patient they will receive a phone call 15 minutes prior to their appointment time (may be from unknown caller ID) so they should be prepared to answer    TELEPHONE CALL NOTE  [name] has been deemed a candidate for a follow-up tele-health visit to limit community exposure during the Covid-19 pandemic. I spoke with the patient via phone to ensure availability of phone/video source, confirm preferred email & phone number, and discuss instructions and expectations.  I reminded [name] to be prepared with any vital sign and/or heart rhythm information that could potentially be obtained via home monitoring, at the time of his visit. I reminded [name] to expect a phone  call prior to his visit.  Danielle Avanell Shackleton 10/17/2018 9:55 AM   INSTRUCTIONS FOR DOWNLOADING THE MYCHART APP TO SMARTPHONE  - The patient must first make sure to have activated MyChart and know their login information - If Apple, go to App Store and type in MyChart in the search bar and download the app. If Android, ask patient to go to [employer] and type in [place] in the search bar and download the app. The app is free but as with any other app downloads, their phone may require them to verify saved payment information or  Apple/Android password.  - The patient will need to then log into the app with their MyChart username and password, and select [facility] as their healthcare provider to link the account. When it is time for your visit, go to the MyChart app, find appointments, and click Begin Video Visit. Be sure to Select Allow for your device to access the Microphone and Camera for your visit. You will then be connected, and your provider will be with you shortly.  **If they have any issues connecting, or need assistance please contact MyChart service desk [phone])**  **If using a computer, in order to ensure the best quality for their visit they will need to use either of the following Internet Browsers: [employer], or Google Chrome**  IF USING DOXIMITY or DOXY.ME - The patient will receive a link just prior to their visit by text.     FULL LENGTH CONSENT FOR TELE-HEALTH VISIT   I hereby voluntarily request, consent and authorize [facility] and its employed or contracted physicians, physician assistants, nurse practitioners or other licensed health care professionals (the Practitioner), to provide me with telemedicine health care services (the "Services") as deemed necessary by the treating Practitioner. I acknowledge and consent to receive the Services by the Practitioner via telemedicine. I understand that the telemedicine visit will involve communicating with the Practitioner through live audiovisual communication technology and the disclosure of certain medical information by electronic transmission. I acknowledge that I have been given the opportunity to request an in-person assessment or other available alternative prior to the telemedicine visit and am voluntarily participating in the telemedicine visit.  I understand that I have the right to withhold or withdraw my consent to the use of telemedicine in the course of my care at any time, without affecting my right to future care  or treatment, and that the Practitioner or I may terminate the telemedicine visit at any time. I understand that I have the right to inspect all information obtained and/or recorded in the course of the telemedicine visit and may receive copies of available information for a reasonable fee.  I understand that some of the potential risks of receiving the Services via telemedicine include:  Marland Kitchen Delay or interruption in medical evaluation due to technological equipment failure or disruption; . Information transmitted may not be sufficient (e.g. poor resolution of images) to allow for appropriate medical decision making by the Practitioner; and/or  . In rare instances, security protocols could fail, causing a breach of personal health information.  Furthermore, I acknowledge that it is my responsibility to provide information about my medical history, conditions and care that is complete and accurate to the best of my ability. I acknowledge that Practitioner's advice, recommendations, and/or decision may be based on factors not within their control, such as incomplete or inaccurate data provided by me or distortions of diagnostic images or specimens that may result from  electronic transmissions. I understand that the practice of medicine is not an exact science and that Practitioner makes no warranties or guarantees regarding treatment outcomes. I acknowledge that I will receive a copy of this consent concurrently upon execution via email to the email address I last provided but may also request a printed copy by calling the office of [facility].    I understand that my insurance will be billed for this visit.   I have read or had this consent read to me. . I understand the contents of this consent, which adequately explains the benefits and risks of the Services being provided via telemedicine.  . I have been provided ample opportunity to ask questions regarding this consent and the Services and have had  my questions answered to my satisfaction. . I give my informed consent for the services to be provided through the use of telemedicine in my medical care  By participating in this telemedicine visit I agree to the above.

## 2018-10-22 NOTE — Progress Notes (Addendum)
Telehealth Visit     Virtual Visit via Video Note   This visit type was conducted due to national recommendations for restrictions regarding the COVID-19 Pandemic (e.g. social distancing) in an effort to limit this patient's exposure and mitigate transmission in our community.  Due to his co-morbid illnesses, this patient is at least at moderate risk for complications without adequate follow up.  This format is felt to be most appropriate for this patient at this time.  All issues noted in this document were discussed and addressed.  A limited physical exam was performed with this format.  Please refer to the patient's chart for his consent to telehealth for [facility].   Evaluation Performed:  Follow-up visit  This visit type was conducted due to national recommendations for restrictions regarding the COVID-19 Pandemic (e.g. social distancing).  This format is felt to be most appropriate for this patient at this time.  All issues noted in this document were discussed and addressed.  No physical exam was performed (except for noted visual exam findings with Video Visits).  Please refer to the patient's chart (MyChart message for video visits and phone note for telephone visits) for the patient's consent to telehealth for [facility].  Date:  10/23/2018   ID:  [name], DOB Sep 19, 1945, MRN [id number]  Patient Location:  Home  Provider location:   Home  PCP:  Leanna Battles, MD  Cardiologist:  Servando Snare  Electrophysiologist:  None   Chief Complaint:  Follow up   History of Present Illness:    [name] is a 73 y.o. male who presents via audio/video conferencing for a telehealth visit today.  Former patient of Dr. Claris Gladden andDr. Tennant's.He basically follows with me.I see his wife as well.  He has chronic atrial fibrillation that has recurred after multiple cardioversions. Hewas previously onCoumadinand then switched over to Eliquis. He had a low risk Myoview  inOctoberof 2016. He has had coronary calcifications noted on prior CT scans. He also has OSA on CPAP, hypertension, hyperlipidemia, obesity. Last CT of the chest from 10/2017 - to repeat in 2 years.   I have followed him over the past several years. He had more issues with back pain with associated HTN - we adjusted his medicines. He had his back fixed - BP came down and we were able to titrate down and even stop some of his antihypertensives. Last seen back in December and he was doing well.   The patient does not have symptoms concerning for COVID-19 infection (fever, chills, cough, or new shortness of breath).   Seen today via Doximity video. He has consented for this visit. [name], his wife, is present as well. Doing ok. Has had his ACE changed to ARB due to persistent cough by his PCP. BP 142/89 to 166/89 today. Has not been checking his BP up until this morning for this visit. No chest pain. Breathing is good as long as he is on level ground and paces himself - he will have some DOE if he goes too fast or up steep inclines.  No palpitations. No problems with his Eliquis - no bleeding/excessive bruising but the cost has escalated to over $300 - he is in the doughnut hole - asking if anything can be done. His back is doing ok but does limit him walking up hills. They have both been staying in for the most part. They are both asking about lab work.   Past Medical History:  Diagnosis Date  .  A-fib ([place])   . Arrhythmia    hx. Atrial Fib. ,"with regurgitation"  . Depression   . Diabetes mellitus   . Dysrhythmia    a. fib-on Eliquis  . Heart murmur   . Hx of echocardiogram    Echo 3/16:  Mild LVH, EF 60-65%, mild MR, severe LAE, mild RAE, PASP 32 mmHg  . Hypercholesteremia   . Hypertension   . Macular degeneration of both eyes   . OSA on CPAP   . Osteoarthritis (arthritis due to wear and tear of joints)   . Palpitations   . Prostate cancer ([place])    seed implantion- 15 to 20 yrs ago  .  Sleep apnea    uses CPAP-settings 5  . Ureter, stricture    hx. 2 yrs ago-stent has been removed now   Past Surgical History:  Procedure Laterality Date  . COLONOSCOPY  2007  . CYSTOSCOPY W/ RETROGRADES  04/04/2012   Procedure: CYSTOSCOPY WITH RETROGRADE PYELOGRAM;  Surgeon: Molli Hazard, MD;  Location: [facility];  Service: Urology;  Laterality: Bilateral;  . CYSTOSCOPY WITH RETROGRADE PYELOGRAM, URETEROSCOPY AND STENT PLACEMENT  04/04/2012   Procedure: CYSTOSCOPY WITH RETROGRADE PYELOGRAM, URETEROSCOPY AND STENT PLACEMENT;  Surgeon: Molli Hazard, MD;  Location: [facility];  Service: Urology;  Laterality: Right;  . JOINT REPLACEMENT Left   . LUMBAR LAMINECTOMY/DECOMPRESSION MICRODISCECTOMY N/A 09/27/2017   Procedure: LAMINECTOMY LUMBAR THREE- LUMBAR FOUR, LUMBAR FOUR- LUMBAR FIVE;  Surgeon: Ashok Pall, MD;  Location: [facility];  Service: Neurosurgery;  Laterality: N/A;  . ROTATOR CUFF REPAIR Left   . seed implants    . TOTAL KNEE ARTHROPLASTY Left 08/06/2014   Procedure: LEFT TOTAL KNEE ARTHROPLASTY;  Surgeon: Susa Day, MD;  Location: [facility];  Service: Orthopedics;  Laterality: Left;     Current Meds  Medication Sig  . allopurinol (ZYLOPRIM) 300 MG tablet Take 300 mg by mouth daily.  Marland Kitchen apixaban (ELIQUIS) 5 MG TABS tablet Take 1 tablet (5 mg total) by mouth 2 (two) times daily.  Marland Kitchen atenolol-chlorthalidone (TENORETIC) 50-25 MG per tablet Take 0.5 tablets by mouth daily.   Marland Kitchen atorvastatin (LIPITOR) 10 MG tablet Take 10 mg by mouth every evening.   . celecoxib (CELEBREX) 200 MG capsule Take 200 mg by mouth 2 (two) times daily.   . colchicine 0.6 MG tablet Take 1 tablet by mouth as needed. For Gout Flares  . losartan (COZAAR) 50 MG tablet Take 1 tablet by mouth daily.  . metFORMIN (GLUCOPHAGE) 1000 MG tablet Take 500 mg by mouth daily with breakfast.  . Multiple Vitamin (MULTIVITAMIN WITH MINERALS) TABS tablet Take 1 tablet by mouth daily. Centrum Silver  . Multiple  Vitamins-Minerals (PRESERVISION AREDS 2) CAPS Take 1 capsule by mouth 2 (two) times daily.   . traMADol (ULTRAM) 50 MG tablet Take 50 mg by mouth 2 (two) times daily. arthritis   . [DISCONTINUED] lisinopril (ZESTRIL) 5 MG tablet TAKE 1 TABLET BY MOUTH TWO  TIMES DAILY     Allergies:   Lisinopril; Sulfa drugs cross reactors; Contrast media [iodinated diagnostic agents]; Doxycycline; Ioxaglate; and Tetracyclines & related   Social History   Tobacco Use  . Smoking status: Former Smoker    Last attempt to quit: 05/15/1973    Years since quitting: 45.4  . Smokeless tobacco: Never Used  Substance Use Topics  . Alcohol use: Yes    Comment: weekly beer or cocktail  . Drug use: No     Family Hx: The patient's family history includes  Cancer in his mother; Colon cancer in his mother; Dementia in his mother; Heart attack in his father and sister; Stroke in his sister. There is no history of Esophageal cancer, Rectal cancer, or Stomach cancer.  ROS:   Please see the history of present illness.   All other systems reviewed are negative. His cough has improved.    Objective:    Vital Signs:  BP (!) 164/99   Pulse 72   Ht 6' (1.829 m)   Wt 223 lb (101.2 kg)   BMI 30.24 kg/m    Wt Readings from Last 3 Encounters:  10/23/18 223 lb (101.2 kg)  05/02/18 233 lb (105.7 kg)  04/24/18 233 lb 3.2 oz (105.8 kg)    Alert male in no acute distress. He looks good. Not short of breath with conversation.    Labs/Other Tests and Data Reviewed:    Lab Results  Component Value Date   WBC 5.1 09/18/2017   HGB 15.7 09/18/2017   HCT 44.9 09/18/2017   PLT 120 (L) 09/18/2017   GLUCOSE 92 10/22/2017   ALT 22 09/21/2013   AST 25 09/21/2013   NA 140 10/22/2017   K 4.2 10/22/2017   CL 102 10/22/2017   CREATININE 1.30 (H) 10/22/2017   BUN 20 10/22/2017   CO2 24 10/22/2017   INR 1.13 09/27/2017   HGBA1C 5.1 09/18/2017        BNP (last 3 results) No results for input(s): BNP in the last 8760  hours.  ProBNP (last 3 results) No results for input(s): PROBNP in the last 8760 hours.    Prior CV studies:    The following studies were reviewed today:  CT CHEST 10/2017 IMPRESSION: Stable ground-glass nodular density is noted in right middle lobe. Unenhanced CT scan in 2 years is recommended to ensure stability. This recommendation follows the consensus statement: Guidelines for Management of Incidental Pulmonary Nodules Detected on CT Images: From the Fleischner Society 2017; Radiology 2017; 284:228-243.  Coronary artery calcifications are noted suggesting coronary artery disease.  Stable other bilateral pulmonary nodules are noted which can be considered benign at this point.  Cholelithiasis.  Aortic Atherosclerosis (ICD10-I70.0).   Electronically Signed By: Marijo Conception, M.D. On: 11/07/2017 14:36   Echo Study Conclusions from 07/2014  - Left ventricle: The cavity size was normal. Wall thickness was increased in a pattern of mild LVH. Systolic function was normal. The estimated ejection fraction was in the range of 60% to 65%. - Mitral valve: There was mild regurgitation. - Left atrium: The atrium was severely dilated. - Right atrium: The atrium was mildly dilated. - Pulmonary arteries: PA peak pressure: 32 mm Hg (S).      Myoview Study Highlights from 02/2015    Defect 1: There is a small defect of mild severity present in the basal inferior and basal inferolateral location.  There was no ST segment deviation noted during stress.  Thinning in the inferior / inferolateral region (base) Otherwise normal perfusion. May represent soft tissue attenuation vs small region of scar Images not gated.  Overall low risk scan.        ASSESSMENT & PLAN:    1. Persistent AF - rate is controlled. Remains on anticoagulation - price is getting high - not really wanting to change to Coumadin. Will see if any assistance is available.    2. HTN - probable ACE cough - now on ARB therapy - BP still not great - he will get back to monitoring. He is  to let us know in a week or so how his BP is doing - may need to increase the ARB.   3. Obesity - weight is down some - he admits they both are not eating great.   4. Coronary calcification on prior CT scan -  Managed with risk factor modification - encouraged to keep up with walking and try to do better with diet.   5. Chronic anticoagulation - needs labs - we will see if there are any options to help with the cost.   6. HLD - on statin - needs labs.   7. Lung nodule - needs repeat CT 10/2019 - not discussed today.   8. COVID-19 Education: The signs and symptoms of COVID-19 were discussed with the patient and how to seek care for testing (follow up with PCP or arrange E-visit).  The importance of social distancing, staying at home, hand hygiene and wearing a mask when out in public were discussed today.  Patient Risk:   After full review of this patient's clinical status, I feel that they are at least moderate risk at this time.  Time:   Today, I have spent 20 minutes with the patient with telehealth technology discussing the above issues.     Medication Adjustments/Labs and Tests Ordered: Current medicines are reviewed at length with the patient today.  Concerns regarding medicines are outlined above.   Tests Ordered: No orders of the defined types were placed in this encounter.   Medication Changes: No orders of the defined types were placed in this encounter.   Disposition:  FU with me in 4 months.    Patient is agreeable to this plan and will call if any problems develop in the interim.   Amie Critchley, NP  10/23/2018 8:31 AM    [facility]   Addendum 10/23/18   Pt is calling to leave his BP readings from today from 10-3   115/75  121/76 117/77 124/67 125/77  This morning he says it was 164/99   Will leave on his  current regimen. Keep eye on BP for now.  Burtis Junes, RN, [place] [address] [place], [place]  [phone]

## 2018-10-23 ENCOUNTER — Encounter: Payer: Self-pay | Admitting: Nurse Practitioner

## 2018-10-23 ENCOUNTER — Telehealth (INDEPENDENT_AMBULATORY_CARE_PROVIDER_SITE_OTHER): Payer: Medicare Other | Admitting: Nurse Practitioner

## 2018-10-23 ENCOUNTER — Telehealth: Payer: Self-pay | Admitting: Nurse Practitioner

## 2018-10-23 ENCOUNTER — Other Ambulatory Visit: Payer: Self-pay

## 2018-10-23 ENCOUNTER — Telehealth: Payer: Self-pay

## 2018-10-23 VITALS — BP 164/99 | HR 72 | Ht 72.0 in | Wt 223.0 lb

## 2018-10-23 DIAGNOSIS — Z7189 Other specified counseling: Secondary | ICD-10-CM

## 2018-10-23 DIAGNOSIS — I482 Chronic atrial fibrillation, unspecified: Secondary | ICD-10-CM | POA: Diagnosis not present

## 2018-10-23 DIAGNOSIS — E785 Hyperlipidemia, unspecified: Secondary | ICD-10-CM

## 2018-10-23 DIAGNOSIS — R058 Other specified cough: Secondary | ICD-10-CM

## 2018-10-23 DIAGNOSIS — R05 Cough: Secondary | ICD-10-CM

## 2018-10-23 DIAGNOSIS — I10 Essential (primary) hypertension: Secondary | ICD-10-CM

## 2018-10-23 DIAGNOSIS — Z7901 Long term (current) use of anticoagulants: Secondary | ICD-10-CM

## 2018-10-23 DIAGNOSIS — R911 Solitary pulmonary nodule: Secondary | ICD-10-CM

## 2018-10-23 DIAGNOSIS — T464X5A Adverse effect of angiotensin-converting-enzyme inhibitors, initial encounter: Secondary | ICD-10-CM

## 2018-10-23 NOTE — Telephone Encounter (Signed)
New Message    Pt is calling to leave his BP readings from today from 10-3   115/75  121/76 117/77 124/67 125/77  This morning he says it was 164/99

## 2018-10-23 NOTE — Telephone Encounter (Signed)
I called [facility] and s/w Lennette Bihari who checked the pts Eliquis cost. Per Lennette Bihari the pt has gone into the donut hole as his coverage gap is now $216 and his normal cost for a 90 day supply is $131.  I called the pt and discussed [place] with him. He is interested in applying and requests that we mail him an application.  He is aware that he will need to include his 2019 proof of income ([place] or bank statement) and his 2020 out of pocket expense report from his pharmacy.  As requested we have placed a [employer] pt asst application in the mail for him this morning and he is aware that our mail often runs slow so it may take a while for him to recieve.

## 2018-10-23 NOTE — Telephone Encounter (Signed)
-----   Message from Burtis Junes, NP sent at 10/23/2018  8:58 AM EDT ----- Kermit Balo morning Jeani Hawking,   Is there anyway you could see if [employer] can get any assistance for his Eliquis - price now over 300 dollars - sounds like he is in the doughnut hole.   Thanks,  lori

## 2018-10-23 NOTE — Telephone Encounter (Signed)
Ok - much better - he can just keep an eye on it - check a few times a week.  For now, no changes with his current regimen.   [name]

## 2018-10-23 NOTE — Patient Instructions (Addendum)
After Visit Summary:  We will be checking the following labs today - NONE  Fasting labs on Monday - BMET, CBC, HPF and Lipids   Medication Instructions:    Continue with your current medicines.   I will see if our office staff - Jeani Hawking - can assist you with the cost of the Eliquis   If you need a refill on your cardiac medications before your next appointment, please call your pharmacy.     Testing/Procedures To Be Arranged:  N/A  Follow-Up:   See me in 4 months.     At [facility], you and your health needs are our priority.  As part of our continuing mission to provide you with exceptional heart care, we have created designated Provider Care Teams.  These Care Teams include your primary Cardiologist (physician) and Advanced Practice Providers (APPs -  Physician Assistants and Nurse Practitioners) who all work together to provide you with the care you need, when you need it.  Special Instructions:  . Stay safe, stay home, wash your hands for at least 20 seconds and wear a mask when out in public.  . It was good to talk with you today.  . Get back to checking your blood pressure for [place] - let us know in a week or so what your readings are doing.    Call the [place] office at [phone] if you have any questions, problems or concerns.

## 2018-10-23 NOTE — Telephone Encounter (Signed)
Pt advised.

## 2018-10-25 ENCOUNTER — Telehealth: Payer: Self-pay

## 2018-10-25 NOTE — Telephone Encounter (Signed)
    COVID-19 Pre-Screening Questions:  . In the past 7 to 10 days have you had a cough,  shortness of breath, headache, congestion, fever (100 or greater) body aches, chills, sore throat, or sudden loss of taste or sense of smell? . Have you been around anyone with known Covid 19. . Have you been around anyone who is awaiting Covid 19 test results in the past 7 to 10 days? . Have you been around anyone who has been exposed to Covid 19, or has mentioned symptoms of Covid 19 within the past 7 to 10 days?  If you have any concerns/questions about symptoms patients report during screening (either on the phone or at threshold). Contact the provider seeing the patient or DOD for further guidance.  If neither are available contact a member of the leadership team.          Pt answered NO to all pre-screening questions. klb [id number] 10/25/2018

## 2018-10-28 ENCOUNTER — Other Ambulatory Visit: Payer: Self-pay

## 2018-10-28 ENCOUNTER — Other Ambulatory Visit: Payer: Medicare Other | Admitting: *Deleted

## 2018-10-28 DIAGNOSIS — I10 Essential (primary) hypertension: Secondary | ICD-10-CM

## 2018-10-28 DIAGNOSIS — E785 Hyperlipidemia, unspecified: Secondary | ICD-10-CM

## 2018-10-28 DIAGNOSIS — Z7901 Long term (current) use of anticoagulants: Secondary | ICD-10-CM

## 2018-10-28 LAB — CBC

## 2018-10-28 NOTE — Telephone Encounter (Signed)
**Note De-Identified  Obfuscation** The pt had several questions about his [employer]MS pt asst application. I have answered all of his questions and his is aware that he can call me back if he has any more questions to ask or he can call [employer] pt asst foundation as they are very helpful and know the answers to the questions that I do not.  He verbalized understanding and thanked me for calling him back.

## 2018-10-28 NOTE — Telephone Encounter (Signed)
Follow up     Pt is calling to speak with Jeani Hawking    Please call back

## 2018-10-29 LAB — CBC
Hematocrit: 45.5 % (ref 37.5–51.0)
Hemoglobin: 15.5 g/dL (ref 13.0–17.7)
MCH: 31.2 pg (ref 26.6–33.0)
MCHC: 34.1 g/dL (ref 31.5–35.7)
MCV: 92 fL (ref 79–97)
Platelets: 127 10*3/uL — ABNORMAL LOW (ref 150–450)
RBC: 4.97 x10E6/uL (ref 4.14–5.80)
RDW: 12.8 % (ref 11.6–15.4)
WBC: 5.3 10*3/uL (ref 3.4–10.8)

## 2018-10-29 LAB — BASIC METABOLIC PANEL
BUN/Creatinine Ratio: 19 (ref 10–24)
BUN: 25 mg/dL (ref 8–27)
CO2: 25 mmol/L (ref 20–29)
Calcium: 9.2 mg/dL (ref 8.6–10.2)
Chloride: 102 mmol/L (ref 96–106)
Creatinine, Ser: 1.29 mg/dL — ABNORMAL HIGH (ref 0.76–1.27)
GFR calc Af Amer: 63 mL/min/{1.73_m2} (ref >59)
GFR calc non Af Amer: 54 mL/min/{1.73_m2} — ABNORMAL LOW (ref >59)
Glucose: 106 mg/dL — ABNORMAL HIGH (ref 65–99)
Potassium: 4.5 mmol/L (ref 3.5–5.2)
Sodium: 141 mmol/L (ref 134–144)

## 2018-10-29 LAB — HEPATIC FUNCTION PANEL
ALT: 31 IU/L (ref 0–44)
AST: 31 IU/L (ref 0–40)
Albumin: 4.4 g/dL (ref 3.7–4.7)
Alkaline Phosphatase: 67 IU/L (ref 39–117)
Bilirubin Total: 1.1 mg/dL (ref 0.0–1.2)
Bilirubin, Direct: 0.29 mg/dL (ref 0.00–0.40)
Total Protein: 6.4 g/dL (ref 6.0–8.5)

## 2018-10-29 LAB — LIPID PANEL
Chol/HDL Ratio: 4.2 ratio (ref 0.0–5.0)
Cholesterol, Total: 137 mg/dL (ref 100–199)
HDL: 33 mg/dL — ABNORMAL LOW (ref >39)
LDL Calculated: 61 mg/dL (ref 0–99)
Triglycerides: 215 mg/dL — ABNORMAL HIGH (ref 0–149)
VLDL Cholesterol Cal: 43 mg/dL — ABNORMAL HIGH (ref 5–40)

## 2018-10-29 NOTE — Telephone Encounter (Signed)
The pt called to let me know that he has completed his BMS pt asst application and that he has placed it in the mail addressed to me at the [facility] office at [address] location.  He did ask that I make some corrections on his application once I receive it as he now realizes he wrote the wrong amount in for his 2019 income. He provided me the correct amount and I will make that correction on his application prior to faxing it in to [employer].

## 2018-11-04 NOTE — Telephone Encounter (Signed)
Completed pt's Pt Asst application for Eliquis that he brought in and faxed it in to [employer].

## 2018-11-27 NOTE — Telephone Encounter (Signed)
Pt had LM on Lynn's VM on 7/13..I called pt back and he stated that he had not heard anything regarding his Pt Asst for Eliquis. I called [employer] and they never received the application even though It was faxed to them 3 times.  I spoke with Velva Harman at [facility], verified the fax number and gave her some of his information so she could start the process.  Confirmation was received after I faxed it.   I called the pt and gave him this information. I gave him the number to [employer] and advised him to call them tomorrow to make sure they received the application and to see if they need any additional information from him.  Pt was very appreciative for the call.

## 2018-11-28 NOTE — Telephone Encounter (Signed)
**Note De-Identified [name] Obfuscation** Letter received [name] fax from [place] stating that they have denied the pt asst with his [name] . Reasons: Patient income [name] is covered by his insurance  The letter states that they notified the pt of this denial. I called and discussed with the pt who states that he is going to try to pay for his [name] and will call us if he gets to a point that he cannot afford [name].

## 2019-02-14 NOTE — Progress Notes (Signed)
CARDIOLOGY OFFICE NOTE  Date:  02/19/2019    [name] Date of Birth: 10/14/1945 Medical Record [id number]  PCP:  Leanna Battles, MD  Cardiologist:  Servando Snare     Chief Complaint  Patient presents with   Follow-up    History of Present Illness: [name] is a 73 y.o. male who presents today for a follow up visit.  Former patient of Dr. Claris Gladden andDr. Tennant's.He basically follows with me.I see his wife as well.  He has chronic atrial fibrillation that has recurred after multiple cardioversions. Hewas previously onCoumadinand then switched over to Eliquis. He had a low risk Myoview inOctoberof 2016.He has had coronary calcifications noted on prior CT scans.He also has OSA on CPAP, hypertension, hyperlipidemia, obesity.Last CT of the chest from 10/2017 - to repeat in 2 years.   I have followed him over the past several years. He had more issues with back pain with associated HTN - we adjusted his medicines. He had his back fixed - BP came down and we were able to titrate down and even stop some of his antihypertensives. Last seen here in the office back in December and he was doing well. We did a telehealth visit back in June - his ACE had been changed to ARB due to cough. Otherwise was doing ok. Stable DOE.   The patient does not have symptoms concerning for COVID-19 infection (fever, chills, cough, or new shortness of breath).   Comes in today. Here alone. He is doing ok. No chest pain. Breathing is ok. Tolerating his AF without issue. No problems with his Eliquis - no bleeding/excessive bruising. Not dizzy or lightheaded. He has been trying to stay busy with yard work and projects at home. He has stopped walking - his neighborhood [place]/inclines - if he tries to walk this, he will feel a "pressure" sensation in his chest and get a little winded - so he has stopped. He does fine on level ground. He has gained some weight. Due for his physical next  month. Sugars are ok.   Noted contrast allergy.   Past Medical History:  Diagnosis Date   A-fib [facility])    Arrhythmia    hx. Atrial Fib. ,"with regurgitation"   Depression    Diabetes mellitus    Dysrhythmia    a. fib-on Eliquis   Heart murmur    Hx of echocardiogram    Echo 3/16:  Mild LVH, EF 60-65%, mild MR, severe LAE, mild RAE, PASP 32 mmHg   Hypercholesteremia    Hypertension    Macular degeneration of both eyes    OSA on CPAP    Osteoarthritis (arthritis due to wear and tear of joints)    Palpitations    Prostate cancer ([place])    seed implantion- 15 to 20 yrs ago   Sleep apnea    uses CPAP-settings 5   Ureter, stricture    hx. 2 yrs ago-stent has been removed now    Past Surgical History:  Procedure Laterality Date   COLONOSCOPY  2007   CYSTOSCOPY W/ RETROGRADES  04/04/2012   Procedure: CYSTOSCOPY WITH RETROGRADE PYELOGRAM;  Surgeon: Molli Hazard, MD;  Location: [facility];  Service: Urology;  Laterality: Bilateral;   CYSTOSCOPY WITH RETROGRADE PYELOGRAM, URETEROSCOPY AND STENT PLACEMENT  04/04/2012   Procedure: CYSTOSCOPY WITH RETROGRADE PYELOGRAM, URETEROSCOPY AND STENT PLACEMENT;  Surgeon: Molli Hazard, MD;  Location: [facility];  Service: Urology;  Laterality: Right;   JOINT REPLACEMENT  Left    LUMBAR LAMINECTOMY/DECOMPRESSION MICRODISCECTOMY N/A 09/27/2017   Procedure: LAMINECTOMY LUMBAR THREE- LUMBAR FOUR, LUMBAR FOUR- LUMBAR FIVE;  Surgeon: Ashok Pall, MD;  Location: [place];  Service: Neurosurgery;  Laterality: N/A;   ROTATOR CUFF REPAIR Left    seed implants     TOTAL KNEE ARTHROPLASTY Left 08/06/2014   Procedure: LEFT TOTAL KNEE ARTHROPLASTY;  Surgeon: Susa Day, MD;  Location: [facility];  Service: Orthopedics;  Laterality: Left;     Medications: Current Meds  Medication Sig   allopurinol (ZYLOPRIM) 300 MG tablet Take 300 mg by mouth daily.   apixaban (ELIQUIS) 5 MG TABS tablet Take 1 tablet (5 mg total) by mouth 2  (two) times daily.   atenolol-chlorthalidone (TENORETIC) 50-25 MG per tablet Take 0.5 tablets by mouth daily.    atorvastatin (LIPITOR) 10 MG tablet Take 10 mg by mouth every evening.    celecoxib (CELEBREX) 200 MG capsule Take 200 mg by mouth 2 (two) times daily.    colchicine 0.6 MG tablet Take 1 tablet by mouth as needed. For Gout Flares   losartan (COZAAR) 50 MG tablet Take 1 tablet by mouth daily.   metFORMIN (GLUCOPHAGE) 1000 MG tablet Take 500 mg by mouth daily with breakfast.   Multiple Vitamin (MULTIVITAMIN WITH MINERALS) TABS tablet Take 1 tablet by mouth daily. Centrum Silver   Multiple Vitamins-Minerals (PRESERVISION AREDS 2) CAPS Take 1 capsule by mouth 2 (two) times daily.    traMADol (ULTRAM) 50 MG tablet Take 50 mg by mouth 2 (two) times daily. arthritis      Allergies: Allergies  Allergen Reactions   Lisinopril Cough   Sulfa Drugs Cross Reactors     CHILDHOOD UNSPECIFIED REACTION    Contrast Media [Iodinated Diagnostic Agents] Rash   Doxycycline Itching   Ioxaglate Rash   Tetracyclines & Related Rash    Social History: The patient  reports that he quit smoking about 45 years ago. He has never used smokeless tobacco. He reports current alcohol use. He reports that he does not use drugs.   Family History: The patient's family history includes Cancer in his mother; Colon cancer in his mother; Dementia in his mother; Heart attack in his father and sister; Stroke in his sister.   Review of Systems: Please see the history of present illness.   All other systems are reviewed and negative.   Physical Exam: VS:  BP 114/70    Pulse (!) 53    Ht 6' (1.829 m)    Wt 236 lb 12.8 oz (107.4 kg)    SpO2 97%    BMI 32.12 kg/m  .  BMI Body mass index is 32.12 kg/m.  Wt Readings from Last 3 Encounters:  02/19/19 236 lb 12.8 oz (107.4 kg)  10/23/18 223 lb (101.2 kg)  05/02/18 233 lb (105.7 kg)    General: Pleasant. Well developed, well nourished and in no acute  distress. Weight is up   HEENT: Normal.  Neck: Supple, no JVD, carotid bruits, or masses noted.  Cardiac: Irregular irregular rhythm. Rate is ok. No murmurs, rubs, or gallops. No edema.  Respiratory:  Lungs are clear to auscultation bilaterally with normal work of breathing.  GI: Soft and nontender.  MS: No deformity or atrophy. Gait and ROM intact.  Skin: Warm and dry. Color is normal.  Neuro:  Strength and sensation are intact and no gross focal deficits noted.  Psych: Alert, appropriate and with normal affect.   LABORATORY DATA:  EKG:  EKG is ordered  today. This demonstrates AF with a slow VR - HR if 53. He has anteroseptal Q's that are chronic.   Lab Results  Component Value Date   WBC 5.3 10/28/2018   HGB 15.5 10/28/2018   HCT 45.5 10/28/2018   PLT 127 (L) 10/28/2018   GLUCOSE 106 (H) 10/28/2018   CHOL 137 10/28/2018   TRIG 215 (H) 10/28/2018   HDL 33 (L) 10/28/2018   LDLCALC 61 10/28/2018   ALT 31 10/28/2018   AST 31 10/28/2018   NA 141 10/28/2018   K 4.5 10/28/2018   CL 102 10/28/2018   CREATININE 1.29 (H) 10/28/2018   BUN 25 10/28/2018   CO2 25 10/28/2018   INR 1.13 09/27/2017   HGBA1C 5.1 09/18/2017     BNP (last 3 results) No results for input(s): BNP in the last 8760 hours.  ProBNP (last 3 results) No results for input(s): PROBNP in the last 8760 hours.   Other Studies Reviewed Today:  CT CHEST 10/2017 IMPRESSION: Stable ground-glass nodular density is noted in right middle lobe. Unenhanced CT scan in 2 years is recommended to ensure stability. This recommendation follows the consensus statement: Guidelines for Management of Incidental Pulmonary Nodules Detected on CT Images: From the Fleischner Society 2017; Radiology 2017; 284:228-243.  Coronary artery calcifications are noted suggesting coronary artery disease.  Stable other bilateral pulmonary nodules are noted which can be considered benign at this point.  Cholelithiasis.  Aortic  Atherosclerosis (ICD10-I70.0).  Electronically Signed By: Marijo Conception, M.D. On: 11/07/2017 14:36   Echo Study Conclusions from 07/2014  - Left ventricle: The cavity size was normal. Wall thickness was increased in a pattern of mild LVH. Systolic function was normal. The estimated ejection fraction was in the range of 60% to 65%. - Mitral valve: There was mild regurgitation. - Left atrium: The atrium was severely dilated. - Right atrium: The atrium was mildly dilated. - Pulmonary arteries: PA peak pressure: 32 mm Hg (S).      Myoview Study Highlights from 02/2015    Defect 1: There is a small defect of mild severity present in the basal inferior and basal inferolateral location.  There was no ST segment deviation noted during stress.  Thinning in the inferior / inferolateral region (base) Otherwise normal perfusion. May represent soft tissue attenuation vs small region of scar Images not gated.  Overall low risk scan.        ASSESSMENT & PLAN:    1. Chest pressure - with exertion/incline - has multiple CV risk factors - mainly diabetes and known presumed CAD based on prior CT scan with chronically abnormal EKG. Will arrange for coronary CT - will need pre med. Lab today. Further disposition to follow.   2. Persistent AF - he is managed with rate control and continued anticoagulation with Eliquis. Lab today. No issues noted. Rate is a little slow but he is not symptomatic.   3. HTN - BP looks good today.   4. Obesity - weight is up with the pandemic - he has stopped walking. See #1.   5. Chronic anticoagulation - rechecking lab today.   6. HLD - on statin therapy - labs from June noted.   7. Lung nodule - needs repeat CT 10/2019 - reminded about this today. Can arrange as we get closer to that time.  8. Health maintenance - flu shot today.    9. COVID-19 Education: The signs and symptoms of COVID-19 were discussed with the patient  and how to seek  care for testing (follow up with PCP or arrange E-visit).  The importance of social distancing, staying at home, hand hygiene and wearing a mask when out in public were discussed today.  Current medicines are reviewed with the patient today.  The patient does not have concerns regarding medicines other than what has been noted above.  The following changes have been made:  See above.  Labs/ tests ordered today include:    Orders Placed This Encounter  Procedures   CT CORONARY MORPH W/CTA COR W/SCORE W/CA W/CM &/OR WO/CM   CT CORONARY FRACTIONAL FLOW RESERVE DATA PREP   CT CORONARY FRACTIONAL FLOW RESERVE FLUID ANALYSIS   Basic metabolic panel   CBC   EKG 12-Lead     Disposition:   FU with me in a few months unless coronary CT is abnormal.   Patient is agreeable to this plan and will call if any problems develop in the interim.   SignedTruitt Merle, NP  02/19/2019 11:05 AM  [facility] [address] [place],   [address] Phone: [phone] Fax: [phone]

## 2019-02-19 ENCOUNTER — Ambulatory Visit: Payer: Medicare Other | Admitting: Nurse Practitioner

## 2019-02-19 ENCOUNTER — Encounter: Payer: Self-pay | Admitting: Nurse Practitioner

## 2019-02-19 ENCOUNTER — Other Ambulatory Visit: Payer: Self-pay

## 2019-02-19 VITALS — BP 114/70 | HR 53 | Ht 72.0 in | Wt 236.8 lb

## 2019-02-19 DIAGNOSIS — R058 Other specified cough: Secondary | ICD-10-CM

## 2019-02-19 DIAGNOSIS — I259 Chronic ischemic heart disease, unspecified: Secondary | ICD-10-CM

## 2019-02-19 DIAGNOSIS — R0789 Other chest pain: Secondary | ICD-10-CM

## 2019-02-19 DIAGNOSIS — I10 Essential (primary) hypertension: Secondary | ICD-10-CM

## 2019-02-19 DIAGNOSIS — Z7189 Other specified counseling: Secondary | ICD-10-CM

## 2019-02-19 DIAGNOSIS — I482 Chronic atrial fibrillation, unspecified: Secondary | ICD-10-CM | POA: Diagnosis not present

## 2019-02-19 DIAGNOSIS — Z23 Encounter for immunization: Secondary | ICD-10-CM | POA: Diagnosis not present

## 2019-02-19 DIAGNOSIS — Z7901 Long term (current) use of anticoagulants: Secondary | ICD-10-CM

## 2019-02-19 DIAGNOSIS — E785 Hyperlipidemia, unspecified: Secondary | ICD-10-CM

## 2019-02-19 DIAGNOSIS — T464X5A Adverse effect of angiotensin-converting-enzyme inhibitors, initial encounter: Secondary | ICD-10-CM

## 2019-02-19 DIAGNOSIS — R05 Cough: Secondary | ICD-10-CM

## 2019-02-19 LAB — BASIC METABOLIC PANEL
BUN/Creatinine Ratio: 19 (ref 10–24)
BUN: 26 mg/dL (ref 8–27)
CO2: 23 mmol/L (ref 20–29)
Calcium: 9 mg/dL (ref 8.6–10.2)
Chloride: 103 mmol/L (ref 96–106)
Creatinine, Ser: 1.37 mg/dL — ABNORMAL HIGH (ref 0.76–1.27)
GFR calc Af Amer: 58 mL/min/{1.73_m2} — ABNORMAL LOW (ref >59)
GFR calc non Af Amer: 50 mL/min/{1.73_m2} — ABNORMAL LOW (ref >59)
Glucose: 104 mg/dL — ABNORMAL HIGH (ref 65–99)
Potassium: 4.4 mmol/L (ref 3.5–5.2)
Sodium: 139 mmol/L (ref 134–144)

## 2019-02-19 LAB — CBC
Hematocrit: 42.2 % (ref 37.5–51.0)
Hemoglobin: 14.7 g/dL (ref 13.0–17.7)
MCH: 32.2 pg (ref 26.6–33.0)
MCHC: 34.8 g/dL (ref 31.5–35.7)
MCV: 92 fL (ref 79–97)
Platelets: 102 10*3/uL — ABNORMAL LOW (ref 150–450)
RBC: 4.57 x10E6/uL (ref 4.14–5.80)
RDW: 12.8 % (ref 11.6–15.4)
WBC: 4.6 10*3/uL (ref 3.4–10.8)

## 2019-02-19 MED ORDER — ATENOLOL 25 MG PO TABS: 25.0000 mg | ORAL_TABLET | ORAL | 0 refills | Status: DC

## 2019-02-19 MED ORDER — PREDNISONE 50 MG PO TABS: ORAL_TABLET | ORAL | 0 refills | Status: DC

## 2019-02-19 NOTE — Patient Instructions (Addendum)
After Visit Summary:  We will be checking the following labs today - BMET & CBC   Medication Instructions:    Continue with your current medicines.    If you need a refill on your cardiac medications before your next appointment, please call your pharmacy.     Testing/Procedures To Be Arranged:  Cardiac CT   Follow-Up:   See me in about 4 months - will try to put with Pat's next visit with me    At [facility], you and your health needs are our priority.  As part of our continuing mission to provide you with exceptional heart care, we have created designated Provider Care Teams.  These Care Teams include your primary Cardiologist (physician) and Advanced Practice Providers (APPs -  Physician Assistants and Nurse Practitioners) who all work together to provide you with the care you need, when you need it.  Special Instructions:  . Stay safe, stay home, wash your hands for at least 20 seconds and wear a mask when out in public.  . It was good to talk with you today.   Your cardiac CT will be scheduled at:   [facility] [address] [place], [place] [address] [phone]   If scheduled at [facility], please arrive at the [facility] main entrance of [facility] 30-45 minutes prior to test start time. Proceed to the [facility] Radiology Department (first floor) to check-in and test prep.  Please follow these instructions carefully (unless otherwise directed):  Hold all erectile dysfunction medications at least 3 days (72 hrs) prior to test.  On the Night Before the Test: . Be sure to Drink plenty of water. . Do not consume any caffeinated/decaffeinated beverages or chocolate 12 hours prior to your test. . Do not take any antihistamines 12 hours prior to your test. . You have a contrast allergy: ? I am giving you a prescription for Prednisone to take as follows: 1. Prednisone 50 mg - take 13 hours prior to test 2. Take another  Prednisone 50 mg 7 hours prior to test 3. Take another Prednisone 50 mg 1 hour prior to test 4. Take Benadryl 50 mg 1 hour prior to test . You must complete all four doses of above prophylactic medications. . You will need a ride after test due to Benadryl.  On the Day of the Test: . Drink plenty of water. Do not drink any water within one hour of the test. . Do not eat any food 4 hours prior to the test. . You may take your regular medications prior to the test.  . You will hold your Tenoretic the morning of the test . You will only take a dose of Atenolol 2 hours prior to the test for that day only (25mg ) - I am giving your a written RX for this.        After the Test: . Drink plenty of water. . After receiving IV contrast, you may experience a mild flushed feeling. This is normal. . On occasion, you may experience a mild rash up to 24 hours after the test. This is not dangerous. If this occurs, you can take Benadryl 25 mg and increase your fluid intake. . If you experience trouble breathing, this can be serious. If it is severe call 911 IMMEDIATELY. If it is mild, please call our office. . Do not take your Metformin for 48 hours after completing the test.   Please contact the cardiac imaging  nurse navigator should you have any questions/concerns Marchia Bond, RN Navigator Cardiac Imaging [facility] [phone] Office  [phone] Cell     Call the [place] office at [phone] if you have any questions, problems or concerns.

## 2019-02-25 ENCOUNTER — Telehealth: Payer: Self-pay | Admitting: *Deleted

## 2019-02-25 NOTE — Telephone Encounter (Signed)
   [facility] Pre-operative Risk Assessment    Request for surgical clearance:  1. What type of surgery is being performed? LEFT UPPER EYELID EXCISION AND REPAIR   2. When is this surgery scheduled? 03/17/19   3. What type of clearance is required (medical clearance vs. Pharmacy clearance to hold med vs. Both)? BOTH  4. Are there any medications that need to be held prior to surgery and how long? ELIQUIS   5. Practice name and name of physician performing surgery? [facility]; DR. Elayne Snare ZALDIVAR   6. What is your office phone number [phone]    7.   What is your office fax number [phone]  8.   Anesthesia type (None, local, MAC, general) ? MAC   Julaine Hua 02/25/2019, 5:00 PM  _________________________________________________________________   (provider comments below)

## 2019-02-25 NOTE — Telephone Encounter (Signed)
Clinical pharmacist to review eliquis 

## 2019-02-26 NOTE — Telephone Encounter (Signed)
Patient is due for coronary CT on 10/19 to evaluate chest pain

## 2019-02-26 NOTE — Telephone Encounter (Signed)
Patient with diagnosis of afib on Eliquis for anticoagulation.    Procedure: LEFT UPPER EYELID EXCISION AND REPAIR  Date of procedure: 03/17/2019  CHADS2-VASc score of  5 (HTN, AGE, DM2, CAD, AGE)  CrCl 57 ml/min  Per office protocol, patient can hold Eliquis for 1-2 days prior to procedure.

## 2019-02-28 ENCOUNTER — Telehealth (HOSPITAL_COMMUNITY): Payer: Self-pay | Admitting: Emergency Medicine

## 2019-02-28 NOTE — Telephone Encounter (Signed)
Reaching out to patient to offer assistance regarding upcoming cardiac imaging study; pt verbalizes understanding of appt date/time, parking situation and where to check in, pre-test NPO status and medications ordered, and verified current allergies; name and call back number provided for further questions should they arise Sean Winkowski RN Navigator Cardiac Imaging [facility] [phone] office [phone] cell 

## 2019-03-03 ENCOUNTER — Ambulatory Visit (HOSPITAL_COMMUNITY)
Admission: RE | Admit: 2019-03-03 | Discharge: 2019-03-03 | Disposition: A | Discharge status: Home / Self Care | Payer: Medicare Other | Source: Ambulatory Visit | Attending: Nurse Practitioner | Admitting: Nurse Practitioner

## 2019-03-03 ENCOUNTER — Other Ambulatory Visit: Payer: Self-pay

## 2019-03-03 DIAGNOSIS — I259 Chronic ischemic heart disease, unspecified: Secondary | ICD-10-CM

## 2019-03-03 DIAGNOSIS — I2510 Atherosclerotic heart disease of native coronary artery without angina pectoris: Secondary | ICD-10-CM | POA: Diagnosis not present

## 2019-03-03 MED ORDER — IOHEXOL 350 MG/ML SOLN
80.0000 mL | Freq: Once | INTRAVENOUS | Status: AC | PRN
  Administered 2019-03-03: 10:00:00 80 mL via INTRAVENOUS

## 2019-03-03 MED ORDER — METOPROLOL TARTRATE 5 MG/5ML IV SOLN: 5.0000 mg | INTRAVENOUS | Status: DC | PRN

## 2019-03-03 MED ORDER — NITROGLYCERIN 0.4 MG SL SUBL
SUBLINGUAL_TABLET | SUBLINGUAL | Status: AC
  Filled 2019-03-03: qty 2

## 2019-03-03 MED ORDER — NITROGLYCERIN 0.4 MG SL SUBL: 0.8000 mg | SUBLINGUAL_TABLET | SUBLINGUAL | Status: DC | PRN

## 2019-03-03 NOTE — Telephone Encounter (Signed)
I will address following the coronary CT FFR   [name]

## 2019-03-05 ENCOUNTER — Other Ambulatory Visit: Payer: Self-pay | Admitting: Nurse Practitioner

## 2019-03-05 NOTE — Progress Notes (Addendum)
CARDIOLOGY OFFICE NOTE  Date:  03/10/2019    [name] Date of Birth: 06/18/1945 Medical Record [id number]  PCP:  Leanna Battles, MD  Cardiologist:  Servando Snare   Chief Complaint  Patient presents with   Follow-up    Pre cath visit    History of Present Illness: [name] is a 73 y.o. male who presents today for a pre cath visit.  Former patient of Dr. Claris Gladden andDr. Tennant's.He basically follows with me.I see his wife as well.  He has chronic atrial fibrillation that has recurred after multiple cardioversions. Hewas previously onCoumadinand then switched over to Eliquis. He had a low risk Myoview inOctoberof 2016.He has had coronary calcifications noted on prior CT scans.He also has OSA on CPAP, hypertension, hyperlipidemia, obesity.Last CT of the chest from 10/2017 - to repeat in 2 years.   I have followed him over the past several years. He had more issues with back pain with associated HTN - we adjusted his medicines. He had his back fixed - BP came down and we were able to titrate down and even stop some of his antihypertensives. Last seen here in the office back inDecember and he was doing well.We did a telehealth visit back in June - his ACE had been changed to ARB due to cough. Otherwise was doing ok. Stable DOE.   Seen here earlier this month - noted a new onset of exertional tightness in the his chest - coronary CT was arranged - this is abnormal with +FFR noted.   The patient does not have symptoms concerning for COVID-19 infection (fever, chills, cough, or new shortness of breath).   Comes in today. Here alone. His wife [name] is on speakerphone. There was much confusion in our lobby downstairs and he was not allowed up at his appointment time. BP is up. He was already nervous due to the nature of today's visit. His chest tightness continues if he walks up an incline. He has had no resting symptoms. He understands his CT is abnormal. He is on  Eliquis - he has had no missed doses noted.   Past Medical History:  Diagnosis Date   A-fib [facility])    Arrhythmia    hx. Atrial Fib. ,"with regurgitation"   Depression    Diabetes mellitus    Dysrhythmia    a. fib-on Eliquis   Heart murmur    Hx of echocardiogram    Echo 3/16:  Mild LVH, EF 60-65%, mild MR, severe LAE, mild RAE, PASP 32 mmHg   Hypercholesteremia    Hypertension    Macular degeneration of both eyes    OSA on CPAP    Osteoarthritis (arthritis due to wear and tear of joints)    Palpitations    Prostate cancer ([place])    seed implantion- 15 to 20 yrs ago   Sleep apnea    uses CPAP-settings 5   Ureter, stricture    hx. 2 yrs ago-stent has been removed now    Past Surgical History:  Procedure Laterality Date   COLONOSCOPY  2007   CYSTOSCOPY W/ RETROGRADES  04/04/2012   Procedure: CYSTOSCOPY WITH RETROGRADE PYELOGRAM;  Surgeon: Molli Hazard, MD;  Location: [facility];  Service: Urology;  Laterality: Bilateral;   CYSTOSCOPY WITH RETROGRADE PYELOGRAM, URETEROSCOPY AND STENT PLACEMENT  04/04/2012   Procedure: CYSTOSCOPY WITH RETROGRADE PYELOGRAM, URETEROSCOPY AND STENT PLACEMENT;  Surgeon: Molli Hazard, MD;  Location: [facility];  Service: Urology;  Laterality: Right;   JOINT  REPLACEMENT Left    LUMBAR LAMINECTOMY/DECOMPRESSION MICRODISCECTOMY N/A 09/27/2017   Procedure: LAMINECTOMY LUMBAR THREE- LUMBAR FOUR, LUMBAR FOUR- LUMBAR FIVE;  Surgeon: Ashok Pall, MD;  Location: [facility];  Service: Neurosurgery;  Laterality: N/A;   ROTATOR CUFF REPAIR Left    seed implants     TOTAL KNEE ARTHROPLASTY Left 08/06/2014   Procedure: LEFT TOTAL KNEE ARTHROPLASTY;  Surgeon: Susa Day, MD;  Location: [facility];  Service: Orthopedics;  Laterality: Left;     Medications: Current Meds  Medication Sig   allopurinol (ZYLOPRIM) 300 MG tablet Take 300 mg by mouth daily.   atenolol (TENORMIN) 25 MG tablet Take 1 tablet (25 mg total) by mouth as  directed.   atenolol-chlorthalidone (TENORETIC) 50-25 MG per tablet Take 0.5 tablets by mouth daily.    atorvastatin (LIPITOR) 10 MG tablet Take 10 mg by mouth at bedtime.    colchicine 0.6 MG tablet Take 1 tablet by mouth as needed (gout).    ELIQUIS 5 MG TABS tablet TAKE 1 TABLET BY MOUTH TWO  TIMES DAILY   losartan (COZAAR) 50 MG tablet Take 25 mg by mouth 2 (two) times daily.    metFORMIN (GLUCOPHAGE) 1000 MG tablet Take 500 mg by mouth daily with breakfast.   Multiple Vitamin (MULTIVITAMIN WITH MINERALS) TABS tablet Take 1 tablet by mouth daily. Centrum Silver   Multiple Vitamins-Minerals (PRESERVISION AREDS 2) CAPS Take 1 capsule by mouth 2 (two) times daily.    nitroGLYCERIN (NITROSTAT) 0.4 MG SL tablet Place 1 tablet (0.4 mg total) under the tongue every 5 (five) minutes as needed for chest pain.   predniSONE (DELTASONE) 50 MG tablet Take 50 mg 13 hours prior to the CT scan, take 50 mg 7 hours prior to the CT scan and take 50 mg 1 hour prior to the CT scan.   traMADol (ULTRAM) 50 MG tablet Take 50 mg by mouth 2 (two) times daily. arthritis    [DISCONTINUED] celecoxib (CELEBREX) 200 MG capsule Take 200 mg by mouth 2 (two) times daily.      Allergies: Allergies  Allergen Reactions   Lisinopril Cough   Sulfa Drugs Cross Reactors     CHILDHOOD UNSPECIFIED REACTION    Contrast Media [Iodinated Diagnostic Agents] Rash   Doxycycline Itching   Ioxaglate Rash   Tetracyclines & Related Rash    Social History: The patient  reports that he quit smoking about 45 years ago. He has never used smokeless tobacco. He reports current alcohol use. He reports that he does not use drugs.   Family History: The patient's family history includes Cancer in his mother; Colon cancer in his mother; Dementia in his mother; Heart attack in his father and sister; Stroke in his sister.   Review of Systems: Please see the history of present illness.   All other systems are reviewed and  negative.   Physical Exam: VS:  BP (!) 160/90    Pulse 88  .  BMI There is no height or weight on file to calculate BMI.  Wt Readings from Last 3 Encounters:  02/19/19 236 lb 12.8 oz (107.4 kg)  10/23/18 223 lb (101.2 kg)  05/02/18 233 lb (105.7 kg)    General: Pleasant. Little anxious. He is in no acute distress.   HEENT: Normal.  Neck: Supple, no JVD, carotid bruits, or masses noted.  Cardiac: Irregular irregular rhythm. Rate is ok. No murmurs, rubs, or gallops. No edema.  Respiratory:  Lungs are clear to auscultation bilaterally with normal work of breathing.  GI: Soft and nontender.  MS: No deformity or atrophy. Gait and ROM intact.  Skin: Warm and dry. Color is normal.  Neuro:  Strength and sensation are intact and no gross focal deficits noted.  Psych: Alert, appropriate and with normal affect.   LABORATORY DATA:  EKG:  EKG is not ordered today.   Lab Results  Component Value Date   WBC 4.6 02/19/2019   HGB 14.7 02/19/2019   HCT 42.2 02/19/2019   PLT 102 (L) 02/19/2019   GLUCOSE 104 (H) 02/19/2019   CHOL 137 10/28/2018   TRIG 215 (H) 10/28/2018   HDL 33 (L) 10/28/2018   LDLCALC 61 10/28/2018   ALT 31 10/28/2018   AST 31 10/28/2018   NA 139 02/19/2019   K 4.4 02/19/2019   CL 103 02/19/2019   CREATININE 1.37 (H) 02/19/2019   BUN 26 02/19/2019   CO2 23 02/19/2019   INR 1.13 09/27/2017   HGBA1C 5.1 09/18/2017     BNP (last 3 results) No results for input(s): BNP in the last 8760 hours.  ProBNP (last 3 results) No results for input(s): PROBNP in the last 8760 hours.   Other Studies Reviewed Today:  CORONARY CT IMPRESSION 02/2019: 1. Coronary calcium score of 1242. This was 67 percentile for age and sex matched control.  2. 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 Right dominance.  3. Study quality is affected by poor vasodilation, diffuse calcifications and motion associated with atrial  fibrillation. However, there is severe stenosis in the mid RCA and possibly in the distal RCA and mid LAD. Additional analysis with CT FFR will be submitted. CAD-RADS 4.  4. Mildly dilated pulmonary artery measuring 31 mm.  5. There is a filling defect in the most antero-superior portion of the left atrial appendage, this might represent a poor contrast mixing on first pass imaging vs a thrombus.   Electronically Signed   By: Ena Dawley   On: 03/03/2019 12:54   IMPRESSION: 1. CT FFR analysis showed severe stenosis in the mid LAD, small PLA and occluded proximal portion of PDA. A cardiac catheterization is recommended. 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   Electronically Signed   By: Stephens November, MD sent to Burtis Junes, NP        I wouldn't, its a first pass image, we have to comment on it, in 95% of cases its just poor contrast mixing, if the patient is complaint with the anticoagulation, then its highly unlikely to have a thrombus.      CT CHEST 10/2017 IMPRESSION: Stable ground-glass nodular density is noted in right middle lobe. Unenhanced CT scan in 2 years is recommended to ensure stability. This recommendation follows the consensus statement: Guidelines for Management of Incidental Pulmonary Nodules Detected on CT Images: From the Fleischner Society 2017; Radiology 2017; 284:228-243.  Coronary artery calcifications are noted suggesting coronary artery disease.  Stable other bilateral pulmonary nodules are noted which can be considered benign at this point.  Cholelithiasis.  Aortic Atherosclerosis (ICD10-I70.0).  Electronically Signed By: Marijo Conception, M.D. On: 11/07/2017 14:36   Echo Study Conclusions from 07/2014  - Left ventricle: The cavity size was normal. Wall thickness was increased in a pattern of mild LVH. Systolic function was  normal. The estimated ejection fraction was in the range of 60% to 65%. - Mitral valve: There was mild regurgitation. - Left atrium: The  atrium was severely dilated. - Right atrium: The atrium was mildly dilated. - Pulmonary arteries: PA peak pressure: 32 mm Hg (S).      Myoview Study Highlights from 02/2015    Defect 1: There is a small defect of mild severity present in the basal inferior and basal inferolateral location.  There was no ST segment deviation noted during stress.  Thinning in the inferior / inferolateral region (base) Otherwise normal perfusion. May represent soft tissue attenuation vs small region of scar Images not gated.  Overall low risk scan.        ASSESSMENT & PLAN:   1. Chest pressure - with exertion/incline - has multiple CV risk factors - mainly diabetes and known presumed CAD based on prior CT scan with chronically abnormal EKG. Now with abnormal coronary CT - there was mention of poor contrast filling on the scan - he has been compliant with his anticoagulation. Discussed with Dr. Meda Coffee as to whether he would need a TEE - she says he does not -   I wouldn't, its a first pass image, we have to comment on it, in 95% of cases its just poor contrast mixing, if the patient is complaint with the anticoagulation, then its highly unlikely to have a thrombus.    In light of the abnormality - we will proceed on with cardiac cath - arranged for this Friday at 7:30 AM with Dr. Tamala Julian. The patient understands that risks include but are not limited to stroke (1 in 1000), death (1 in 25), kidney failure [usually temporary] (1 in 500), bleeding (1 in 200), allergic reaction [possibly serious] (1 in 200), and agrees to proceed. Will hold his Eliquis 2 days prior. Hold Tenoretic the day of. Lab today. He wishes to see EBG if CABG is needed. I am stopping Celebrex today. He will be postponing his eye surgery for next week. He is to refrain from strenuous  activities. NTG is called in for him. COVID testing for Tuesday is arranged.   2. Persistent AF- he is managed with rate control and continued anticoagulation with Eliquis. He is adamant that he has had no missed doses of Eliquis.   3. HTN - BP is up here today - I think this was from the situation that occurred in the screening lobby. Would follow.   4. Obesity   5. Chronic anticoagulation - no missed doses - see Dr. Francesca Oman comments regarding CT findings - this was clarified with her and that Levern did not need TEE. I am stopping his Celebrex - if he were to have PCI - he will be on triple therapy and I feel Celebrex will only increase his risk for bleeding.   6. HLD - on statin  7. Lung nodule - needs repeat CT 10/2019- not discussed today.   8. COVID-19 Education: The signs and symptoms of COVID-19 were discussed with the patient and how to seek care for testing (follow up with PCP or arrange E-visit).  The importance of social distancing, staying at home, hand hygiene and wearing a mask when out in public were discussed today.  Current medicines are reviewed with the patient today.  The patient does not have concerns regarding medicines other than what has been noted above.  The following changes have been made:  See above.  Labs/ tests ordered today include:    Orders Placed This Encounter  Procedures   Comprehensive metabolic panel   CBC     Disposition:   FU with me  few weeks post cath.    Patient is agreeable to this plan and will call if any problems develop in the interim.   SignedTruitt Merle, NP  03/10/2019 4:58 PM  [facility] [address] [place], [place]  [address] Phone: [phone] Fax: [phone]

## 2019-03-05 NOTE — H&P (View-Only) (Signed)
CARDIOLOGY OFFICE NOTE  Date:  03/10/2019    [name] Date of Birth: 20-Feb-1946 Medical Record [id number]  PCP:  Leanna Battles, MD  Cardiologist:  Servando Snare   Chief Complaint  Patient presents with   Follow-up    Pre cath visit    History of Present Illness: [name] is a 73 y.o. male who presents today for a pre cath visit.  Former patient of Dr. Claris Gladden andDr. Tennant's.He basically follows with me.I see his wife as well.  He has chronic atrial fibrillation that has recurred after multiple cardioversions. Hewas previously onCoumadinand then switched over to Eliquis. He had a low risk Myoview inOctoberof 2016.He has had coronary calcifications noted on prior CT scans.He also has OSA on CPAP, hypertension, hyperlipidemia, obesity.Last CT of the chest from 10/2017 - to repeat in 2 years.   I have followed him over the past several years. He had more issues with back pain with associated HTN - we adjusted his medicines. He had his back fixed - BP came down and we were able to titrate down and even stop some of his antihypertensives. Last seen here in the office back inDecember and he was doing well.We did a telehealth visit back in June - his ACE had been changed to ARB due to cough. Otherwise was doing ok. Stable DOE.   Seen here earlier this month - noted a new onset of exertional tightness in the his chest - coronary CT was arranged - this is abnormal with +FFR noted.   The patient does not have symptoms concerning for COVID-19 infection (fever, chills, cough, or new shortness of breath).   Comes in today. Here alone. His wife [name] is on speakerphone. There was much confusion in our lobby downstairs and he was not allowed up at his appointment time. BP is up. He was already nervous due to the nature of today's visit. His chest tightness continues if he walks up an incline. He has had no resting symptoms. He understands his CT is abnormal. He is on  Eliquis - he has had no missed doses noted.   Past Medical History:  Diagnosis Date   A-fib [facility])    Arrhythmia    hx. Atrial Fib. ,"with regurgitation"   Depression    Diabetes mellitus    Dysrhythmia    a. fib-on Eliquis   Heart murmur    Hx of echocardiogram    Echo 3/16:  Mild LVH, EF 60-65%, mild MR, severe LAE, mild RAE, PASP 32 mmHg   Hypercholesteremia    Hypertension    Macular degeneration of both eyes    OSA on CPAP    Osteoarthritis (arthritis due to wear and tear of joints)    Palpitations    Prostate cancer ([place])    seed implantion- 15 to 20 yrs ago   Sleep apnea    uses CPAP-settings 5   Ureter, stricture    hx. 2 yrs ago-stent has been removed now    Past Surgical History:  Procedure Laterality Date   COLONOSCOPY  2007   CYSTOSCOPY W/ RETROGRADES  04/04/2012   Procedure: CYSTOSCOPY WITH RETROGRADE PYELOGRAM;  Surgeon: Molli Hazard, MD;  Location: [facility];  Service: Urology;  Laterality: Bilateral;   CYSTOSCOPY WITH RETROGRADE PYELOGRAM, URETEROSCOPY AND STENT PLACEMENT  04/04/2012   Procedure: CYSTOSCOPY WITH RETROGRADE PYELOGRAM, URETEROSCOPY AND STENT PLACEMENT;  Surgeon: Molli Hazard, MD;  Location: [facility];  Service: Urology;  Laterality: Right;   JOINT  REPLACEMENT Left    LUMBAR LAMINECTOMY/DECOMPRESSION MICRODISCECTOMY N/A 09/27/2017   Procedure: LAMINECTOMY LUMBAR THREE- LUMBAR FOUR, LUMBAR FOUR- LUMBAR FIVE;  Surgeon: Ashok Pall, MD;  Location: [facility];  Service: Neurosurgery;  Laterality: N/A;   ROTATOR CUFF REPAIR Left    seed implants     TOTAL KNEE ARTHROPLASTY Left 08/06/2014   Procedure: LEFT TOTAL KNEE ARTHROPLASTY;  Surgeon: Susa Day, MD;  Location: [facility];  Service: Orthopedics;  Laterality: Left;     Medications: Current Meds  Medication Sig   allopurinol (ZYLOPRIM) 300 MG tablet Take 300 mg by mouth daily.   atenolol (TENORMIN) 25 MG tablet Take 1 tablet (25 mg total) by mouth as  directed.   atenolol-chlorthalidone (TENORETIC) 50-25 MG per tablet Take 0.5 tablets by mouth daily.    atorvastatin (LIPITOR) 10 MG tablet Take 10 mg by mouth at bedtime.    colchicine 0.6 MG tablet Take 1 tablet by mouth as needed (gout).    ELIQUIS 5 MG TABS tablet TAKE 1 TABLET BY MOUTH TWO  TIMES DAILY   losartan (COZAAR) 50 MG tablet Take 25 mg by mouth 2 (two) times daily.    metFORMIN (GLUCOPHAGE) 1000 MG tablet Take 500 mg by mouth daily with breakfast.   Multiple Vitamin (MULTIVITAMIN WITH MINERALS) TABS tablet Take 1 tablet by mouth daily. Centrum Silver   Multiple Vitamins-Minerals (PRESERVISION AREDS 2) CAPS Take 1 capsule by mouth 2 (two) times daily.    nitroGLYCERIN (NITROSTAT) 0.4 MG SL tablet Place 1 tablet (0.4 mg total) under the tongue every 5 (five) minutes as needed for chest pain.   predniSONE (DELTASONE) 50 MG tablet Take 50 mg 13 hours prior to the CT scan, take 50 mg 7 hours prior to the CT scan and take 50 mg 1 hour prior to the CT scan.   traMADol (ULTRAM) 50 MG tablet Take 50 mg by mouth 2 (two) times daily. arthritis    [DISCONTINUED] celecoxib (CELEBREX) 200 MG capsule Take 200 mg by mouth 2 (two) times daily.      Allergies: Allergies  Allergen Reactions   Lisinopril Cough   Sulfa Drugs Cross Reactors     CHILDHOOD UNSPECIFIED REACTION    Contrast Media [Iodinated Diagnostic Agents] Rash   Doxycycline Itching   Ioxaglate Rash   Tetracyclines & Related Rash    Social History: The patient  reports that he quit smoking about 45 years ago. He has never used smokeless tobacco. He reports current alcohol use. He reports that he does not use drugs.   Family History: The patient's family history includes Cancer in his mother; Colon cancer in his mother; Dementia in his mother; Heart attack in his father and sister; Stroke in his sister.   Review of Systems: Please see the history of present illness.   All other systems are reviewed and  negative.   Physical Exam: VS:  BP (!) 160/90    Pulse 88  .  BMI There is no height or weight on file to calculate BMI.  Wt Readings from Last 3 Encounters:  02/19/19 236 lb 12.8 oz (107.4 kg)  10/23/18 223 lb (101.2 kg)  05/02/18 233 lb (105.7 kg)    General: Pleasant. Little anxious. He is in no acute distress.   HEENT: Normal.  Neck: Supple, no JVD, carotid bruits, or masses noted.  Cardiac: Irregular irregular rhythm. Rate is ok. No murmurs, rubs, or gallops. No edema.  Respiratory:  Lungs are clear to auscultation bilaterally with normal work of breathing.  GI: Soft and nontender.  MS: No deformity or atrophy. Gait and ROM intact.  Skin: Warm and dry. Color is normal.  Neuro:  Strength and sensation are intact and no gross focal deficits noted.  Psych: Alert, appropriate and with normal affect.   LABORATORY DATA:  EKG:  EKG is not ordered today.   Lab Results  Component Value Date   WBC 4.6 02/19/2019   HGB 14.7 02/19/2019   HCT 42.2 02/19/2019   PLT 102 (L) 02/19/2019   GLUCOSE 104 (H) 02/19/2019   CHOL 137 10/28/2018   TRIG 215 (H) 10/28/2018   HDL 33 (L) 10/28/2018   LDLCALC 61 10/28/2018   ALT 31 10/28/2018   AST 31 10/28/2018   NA 139 02/19/2019   K 4.4 02/19/2019   CL 103 02/19/2019   CREATININE 1.37 (H) 02/19/2019   BUN 26 02/19/2019   CO2 23 02/19/2019   INR 1.13 09/27/2017   HGBA1C 5.1 09/18/2017     BNP (last 3 results) No results for input(s): BNP in the last 8760 hours.  ProBNP (last 3 results) No results for input(s): PROBNP in the last 8760 hours.   Other Studies Reviewed Today:  CORONARY CT IMPRESSION 02/2019: 1. Coronary calcium score of 1242. This was 38 percentile for age and sex matched control.  2. 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 Right dominance.  3. Study quality is affected by poor vasodilation, diffuse calcifications and motion associated with atrial  fibrillation. However, there is severe stenosis in the mid RCA and possibly in the distal RCA and mid LAD. Additional analysis with CT FFR will be submitted. CAD-RADS 4.  4. Mildly dilated pulmonary artery measuring 31 mm.  5. There is a filling defect in the most antero-superior portion of the left atrial appendage, this might represent a poor contrast mixing on first pass imaging vs a thrombus.   Electronically Signed   By: Ena Dawley   On: 03/03/2019 12:54   IMPRESSION: 1. CT FFR analysis showed severe stenosis in the mid LAD, small PLA and occluded proximal portion of PDA. A cardiac catheterization is recommended. 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   Electronically Signed   By: Stephens November, MD sent to Burtis Junes, NP        I wouldn't, its a first pass image, we have to comment on it, in 95% of cases its just poor contrast mixing, if the patient is complaint with the anticoagulation, then its highly unlikely to have a thrombus.      CT CHEST 10/2017 IMPRESSION: Stable ground-glass nodular density is noted in right middle lobe. Unenhanced CT scan in 2 years is recommended to ensure stability. This recommendation follows the consensus statement: Guidelines for Management of Incidental Pulmonary Nodules Detected on CT Images: From the Fleischner Society 2017; Radiology 2017; 284:228-243.  Coronary artery calcifications are noted suggesting coronary artery disease.  Stable other bilateral pulmonary nodules are noted which can be considered benign at this point.  Cholelithiasis.  Aortic Atherosclerosis (ICD10-I70.0).  Electronically Signed By: Marijo Conception, M.D. On: 11/07/2017 14:36   Echo Study Conclusions from 07/2014  - Left ventricle: The cavity size was normal. Wall thickness was increased in a pattern of mild LVH. Systolic function was  normal. The estimated ejection fraction was in the range of 60% to 65%. - Mitral valve: There was mild regurgitation. - Left atrium: The  atrium was severely dilated. - Right atrium: The atrium was mildly dilated. - Pulmonary arteries: PA peak pressure: 32 mm Hg (S).      Myoview Study Highlights from 02/2015    Defect 1: There is a small defect of mild severity present in the basal inferior and basal inferolateral location.  There was no ST segment deviation noted during stress.  Thinning in the inferior / inferolateral region (base) Otherwise normal perfusion. May represent soft tissue attenuation vs small region of scar Images not gated.  Overall low risk scan.        ASSESSMENT & PLAN:   1. Chest pressure - with exertion/incline - has multiple CV risk factors - mainly diabetes and known presumed CAD based on prior CT scan with chronically abnormal EKG. Now with abnormal coronary CT - there was mention of poor contrast filling on the scan - he has been compliant with his anticoagulation. Discussed with Dr. Meda Coffee as to whether he would need a TEE - she says he does not -   I wouldn't, its a first pass image, we have to comment on it, in 95% of cases its just poor contrast mixing, if the patient is complaint with the anticoagulation, then its highly unlikely to have a thrombus.    In light of the abnormality - we will proceed on with cardiac cath - arranged for this Friday at 7:30 AM with Dr. Tamala Julian. The patient understands that risks include but are not limited to stroke (1 in 1000), death (1 in 67), kidney failure [usually temporary] (1 in 500), bleeding (1 in 200), allergic reaction [possibly serious] (1 in 200), and agrees to proceed. Will hold his Eliquis 2 days prior. Hold Tenoretic the day of. Lab today. He wishes to see EBG if CABG is needed. I am stopping Celebrex today. He will be postponing his eye surgery for next week. He is to refrain from strenuous  activities. NTG is called in for him. COVID testing for Tuesday is arranged.   2. Persistent AF- he is managed with rate control and continued anticoagulation with Eliquis. He is adamant that he has had no missed doses of Eliquis.   3. HTN - BP is up here today - I think this was from the situation that occurred in the screening lobby. Would follow.   4. Obesity   5. Chronic anticoagulation - no missed doses - see Dr. Francesca Oman comments regarding CT findings - this was clarified with her and that [place] did not need TEE. I am stopping his Celebrex - if he were to have PCI - he will be on triple therapy and I feel Celebrex will only increase his risk for bleeding.   6. HLD - on statin  7. Lung nodule - needs repeat CT 10/2019- not discussed today.   8. COVID-19 Education: The signs and symptoms of COVID-19 were discussed with the patient and how to seek care for testing (follow up with PCP or arrange E-visit).  The importance of social distancing, staying at home, hand hygiene and wearing a mask when out in public were discussed today.  Current medicines are reviewed with the patient today.  The patient does not have concerns regarding medicines other than what has been noted above.  The following changes have been made:  See above.  Labs/ tests ordered today include:    Orders Placed This Encounter  Procedures   Comprehensive metabolic panel   CBC     Disposition:   FU with me  few weeks post cath.    Patient is agreeable to this plan and will call if any problems develop in the interim.   SignedTruitt Merle, NP  03/10/2019 4:58 PM  [facility] [address] [place], [place]  [address] Phone: [phone] Fax: [phone]

## 2019-03-05 NOTE — Telephone Encounter (Signed)
This will need to be postponed due to abnormal coronary CT.  Will discuss with patient when seen next week.   Burtis Junes, RN, [facility] [address] [place], [place]  [phone]

## 2019-03-06 NOTE — Telephone Encounter (Signed)
Pt last saw Truitt Merle, NP on 02/19/19, last labs 02/19/19 Creat 1.37, age 73, weight 107.4kg, based on specified criteria pt is on appropriate dosage of Eliquis 5mg  BID.  Will refill rx.

## 2019-03-10 ENCOUNTER — Ambulatory Visit: Payer: Medicare Other | Admitting: Nurse Practitioner

## 2019-03-10 ENCOUNTER — Encounter: Payer: Self-pay | Admitting: Nurse Practitioner

## 2019-03-10 ENCOUNTER — Other Ambulatory Visit: Payer: Self-pay | Admitting: Nurse Practitioner

## 2019-03-10 ENCOUNTER — Other Ambulatory Visit: Payer: Self-pay

## 2019-03-10 VITALS — BP 160/90 | HR 88

## 2019-03-10 DIAGNOSIS — Z7901 Long term (current) use of anticoagulants: Secondary | ICD-10-CM | POA: Diagnosis not present

## 2019-03-10 DIAGNOSIS — I482 Chronic atrial fibrillation, unspecified: Secondary | ICD-10-CM | POA: Diagnosis not present

## 2019-03-10 DIAGNOSIS — I259 Chronic ischemic heart disease, unspecified: Secondary | ICD-10-CM | POA: Diagnosis not present

## 2019-03-10 DIAGNOSIS — E785 Hyperlipidemia, unspecified: Secondary | ICD-10-CM

## 2019-03-10 DIAGNOSIS — I10 Essential (primary) hypertension: Secondary | ICD-10-CM | POA: Diagnosis not present

## 2019-03-10 MED ORDER — NITROGLYCERIN 0.4 MG SL SUBL: 0.4000 mg | SUBLINGUAL_TABLET | SUBLINGUAL | 3 refills | Status: DC | PRN

## 2019-03-10 NOTE — Patient Instructions (Addendum)
After Visit Summary:  We will be checking the following labs today - CMET and CBC   Medication Instructions:    Continue with your current medicines. BUT  I am stopping Celebrex I am sending you some NTG - Use your NTG under your tongue for recurrent chest pain. May take one tablet every 5 minutes. If you are still having discomfort after 3 tablets in 15 minutes, call 911.   If you need a refill on your cardiac medications before your next appointment, please call your pharmacy.     Testing/Procedures To Be Arranged:  N/A  Follow-Up:   See me in about 3 weeks for post cath visit    At [facility], you and your health needs are our priority.  As part of our continuing mission to provide you with exceptional heart care, we have created designated Provider Care Teams.  These Care Teams include your primary Cardiologist (physician) and Advanced Practice Providers (APPs -  Physician Assistants and Nurse Practitioners) who all work together to provide you with the care you need, when you need it.  Special Instructions:  . Stay safe, stay home, wash your hands for at least 20 seconds and wear a mask when out in public.  . It was good to talk with you today.    Your provider has recommended a cardiac catherization.   Prior to your catheterization, you will need COVID screening. You are scheduled for your screening on Tuesday at ________________________________. The testing center is located at the [facility] at [address] It closes at 3:15 PM. Stay in the RIGHT lane and tell them you are there for "pre-procedure screening".   You are scheduled for a cardiac catheterization on Friday, October 30th at 7:30AM with Dr. Tamala Julian or associate.  Please arrive at the [facility] (Main Entrance) at [facility] at [address] [place] Stay on Friday, October 30th at [place] parking is available.   You are allowed only one  visitor in the hospital with you. Both you and your visitor must wear masks.    Special note: Every effort is made to have your procedure done on time.   Please understand that emergencies sometimes delay a scheduled   procedure.  No food or drink after midnight on Thursday.   You may take your morning medications with a sip of water on the day of your procedure.  Please take a baby aspirin (81 mg) on the morning of your procedure.   Medications to HOLD - Tenoretic the morning of your surgery.  HOLD Eliquis for 2 days prior to the procedure  Plan for a one night stay -- bring personal belongings.  Bring a current list of your medications and current insurance cards.  You MUST have a responsible person to drive you home. Someone MUST be with you the first 24 hours after you arrive home or your discharge will be delayed. Wear clothes that are easy to get on and off and wear slip on shoes.    Coronary Angiogram A coronary angiogram, also called coronary angiography, is an X-ray procedure used to look at the arteries in the heart. In this procedure, a dye (contrast dye) is injected through a long, hollow tube (catheter). The catheter is about the size of a piece of cooked spaghetti and is inserted through your groin, wrist, or arm. The dye is injected into each artery, and X-rays are then taken to  show if there is a blockage in the arteries of your heart.  LET [facility] CARE PROVIDER KNOW ABOUT:  Any allergies you have, including allergies to shellfish or contrast dye.    All medicines you are taking, including vitamins, herbs, eye drops, creams, and over-the-counter medicines.    Previous problems you or members of your family have had with the use of anesthetics.    Any blood disorders you have.    Previous surgeries you have had.  History of kidney problems or failure.    Other medical conditions you have.  RISKS AND COMPLICATIONS  Generally, a coronary angiogram is a safe  procedure. However, about 1 person out of 1000 can have problems that may include:  Allergic reaction to the dye.  Bleeding/bruising from the access site or other locations.  Kidney injury, especially in people with impaired kidney function.   Stroke (rare).  Heart attack (rare).  Irregular rhythms (rare)  Death (rare)  BEFORE THE PROCEDURE   Do not eat or drink anything after midnight the night before the procedure or as directed by your health care provider.    Ask your health care provider about changing or stopping your regular medicines. This is especially important if you are taking diabetes medicines or blood thinners.  PROCEDURE  You may be given a medicine to help you relax (sedative) before the procedure. This medicine is given through an intravenous (IV) access tube that is inserted into one of your veins.    The area where the catheter will be inserted will be washed and shaved. This is usually done in the groin but may be done in the fold of your arm (near your elbow) or in the wrist.     A medicine will be given to numb the area where the catheter will be inserted (local anesthetic).    The health care provider will insert the catheter into an artery. The catheter will be guided by using a special type of X-ray (fluoroscopy) of the blood vessel being examined.    A special dye will then be injected into the catheter, and X-rays will be taken. The dye will help to show where any narrowing or blockages are located in the heart arteries.     AFTER THE PROCEDURE   If the procedure is done through the leg, you will be kept in bed lying flat for several hours. You will be instructed to not bend or cross your legs.  The insertion site will be checked frequently.    The pulse in your feet or wrist will be checked frequently.    Additional blood tests, X-rays, and an electrocardiogram may be done.       Call the [place] office at [phone] if you have any questions, problems or concerns.

## 2019-03-11 ENCOUNTER — Telehealth: Payer: Self-pay | Admitting: Interventional Cardiology

## 2019-03-11 ENCOUNTER — Other Ambulatory Visit (HOSPITAL_COMMUNITY)
Admission: RE | Admit: 2019-03-11 | Discharge: 2019-03-11 | Disposition: A | Discharge status: Home / Self Care | Payer: Medicare Other | Source: Ambulatory Visit | Attending: Interventional Cardiology | Admitting: Interventional Cardiology

## 2019-03-11 ENCOUNTER — Other Ambulatory Visit: Payer: Self-pay | Admitting: Nurse Practitioner

## 2019-03-11 DIAGNOSIS — Z20828 Contact with and (suspected) exposure to other viral communicable diseases: Secondary | ICD-10-CM | POA: Insufficient documentation

## 2019-03-11 DIAGNOSIS — Z01812 Encounter for preprocedural laboratory examination: Secondary | ICD-10-CM | POA: Insufficient documentation

## 2019-03-11 LAB — COMPREHENSIVE METABOLIC PANEL
ALT: 61 IU/L — ABNORMAL HIGH (ref 0–44)
AST: 31 IU/L (ref 0–40)
Albumin/Globulin Ratio: 2.3 — ABNORMAL HIGH (ref 1.2–2.2)
Albumin: 4.4 g/dL (ref 3.7–4.7)
Alkaline Phosphatase: 61 IU/L (ref 39–117)
BUN/Creatinine Ratio: 23 (ref 10–24)
BUN: 29 mg/dL — ABNORMAL HIGH (ref 8–27)
Bilirubin Total: 0.7 mg/dL (ref 0.0–1.2)
CO2: 22 mmol/L (ref 20–29)
Calcium: 9.1 mg/dL (ref 8.6–10.2)
Chloride: 104 mmol/L (ref 96–106)
Creatinine, Ser: 1.24 mg/dL (ref 0.76–1.27)
GFR calc Af Amer: 65 mL/min/{1.73_m2} (ref >59)
GFR calc non Af Amer: 57 mL/min/{1.73_m2} — ABNORMAL LOW (ref >59)
Globulin, Total: 1.9 g/dL (ref 1.5–4.5)
Glucose: 106 mg/dL — ABNORMAL HIGH (ref 65–99)
Potassium: 4.1 mmol/L (ref 3.5–5.2)
Sodium: 143 mmol/L (ref 134–144)
Total Protein: 6.3 g/dL (ref 6.0–8.5)

## 2019-03-11 LAB — CBC
Hematocrit: 45 % (ref 37.5–51.0)
Hemoglobin: 15.7 g/dL (ref 13.0–17.7)
MCH: 31.5 pg (ref 26.6–33.0)
MCHC: 34.9 g/dL (ref 31.5–35.7)
MCV: 90 fL (ref 79–97)
Platelets: 136 10*3/uL — ABNORMAL LOW (ref 150–450)
RBC: 4.99 x10E6/uL (ref 4.14–5.80)
RDW: 13.2 % (ref 11.6–15.4)
WBC: 6.3 10*3/uL (ref 3.4–10.8)

## 2019-03-11 NOTE — Telephone Encounter (Signed)
New Message    Pt is returning call he said he got about an hour ago regarding his cath lab procedure     Please call back

## 2019-03-12 ENCOUNTER — Other Ambulatory Visit: Payer: Self-pay | Admitting: *Deleted

## 2019-03-12 ENCOUNTER — Telehealth: Payer: Self-pay | Admitting: Nurse Practitioner

## 2019-03-12 LAB — NOVEL CORONAVIRUS, NAA (HOSP ORDER, SEND-OUT TO REF LAB; TAT 18-24 HRS): SARS-CoV-2, NAA: NOT DETECTED

## 2019-03-12 MED ORDER — PREDNISONE 50 MG PO TABS: ORAL_TABLET | ORAL | 0 refills | Status: DC

## 2019-03-12 NOTE — Telephone Encounter (Signed)
S/w [name], pt's wife, per (DPR) sent in prednisone (50 mg) for pt to take one tablet (50 mg ) Thursday, Oct 29 @ 6:30 pm, one tablet (50 mg ) 12 midnight, one tablet Friday, oct 30 at 6:30 am to requested pharmacy.

## 2019-03-12 NOTE — Telephone Encounter (Signed)
Pt calling stating that medication Prednisone was supposed to be sent in to his pharmacy and pt states that his pharmacy still has not received it. Pt would like a call back. Please address

## 2019-03-13 ENCOUNTER — Telehealth: Payer: Self-pay | Admitting: *Deleted

## 2019-03-13 NOTE — Telephone Encounter (Signed)
Pt contacted pre-catheterization scheduled at [facility] for: Friday March 14, 2019 7:30 AM Verified arrival time and place: [facility]) at: 5:30 AM   Nothing to eat or drink after midnight prior to procedure.  Contrast allergy: yes-13 hour Prednisone and Benadryl Prep reviewed with patient: Prednisone 50 mg-03/13/19 6:30 PM Prednisone 50 mg-03/14/19 12:30 AM Prednisone 50 mg and Benadryl 50 mg-03/14/19- AM just prior to leaving for hospital for procedure.  Hold: Eliquis-none 03/12/19 until post procedure. Losartan-day before and day of procedure. Tenoretic-day before and day of procedure. Metformin-day of procedure and 48 hours post procedure.  Except hold medications AM meds can be  taken pre-cath with sip of water including: ASA 81 mg   Confirmed patient has responsible adult to drive home post procedure and observe 24 hours after arriving home: yes  Currently, due to Covid-19 pandemic, only one support person will be allowed with patient. Must be the same support person for that patient's entire stay, will be screened and required to wear a mask. They will be asked to wait in the waiting room for the duration of the patient's stay.  Patients are required to wear a mask when they enter the hospital.     COVID-19 Pre-Screening Questions:  . In the past 7 to 10 days have you had a cough,  shortness of breath, headache, congestion, fever (100 or greater) body aches, chills, sore throat, or sudden loss of taste or sense of smell? no . Have you been around anyone with known Covid 19? no . Have you been around anyone who is awaiting Covid 19 test results in the past 7 to 10 days? no . Have you been around anyone who has been exposed to Covid 19, or has mentioned symptoms of Covid 19 within the past 7 to 10 days? no    I reviewed procedure/mask/visitor instructions, Covid-19 screening questions with patient, he verbalized understanding, thanked  me for call.

## 2019-03-14 ENCOUNTER — Encounter (HOSPITAL_COMMUNITY)
Admission: RE | Disposition: A | Discharge status: Home / Self Care | Payer: Self-pay | Source: Home / Self Care | Attending: Interventional Cardiology

## 2019-03-14 ENCOUNTER — Other Ambulatory Visit: Payer: Self-pay

## 2019-03-14 ENCOUNTER — Ambulatory Visit (HOSPITAL_COMMUNITY)
Admission: RE | Admit: 2019-03-14 | Discharge: 2019-03-14 | Disposition: A | Discharge status: Home / Self Care | Payer: Medicare Other | Attending: Interventional Cardiology | Admitting: Interventional Cardiology

## 2019-03-14 ENCOUNTER — Encounter (HOSPITAL_COMMUNITY): Payer: Self-pay | Admitting: Interventional Cardiology

## 2019-03-14 DIAGNOSIS — I25110 Atherosclerotic heart disease of native coronary artery with unstable angina pectoris: Secondary | ICD-10-CM | POA: Diagnosis not present

## 2019-03-14 DIAGNOSIS — R0600 Dyspnea, unspecified: Secondary | ICD-10-CM | POA: Diagnosis present

## 2019-03-14 DIAGNOSIS — Z7901 Long term (current) use of anticoagulants: Secondary | ICD-10-CM | POA: Diagnosis not present

## 2019-03-14 DIAGNOSIS — I4891 Unspecified atrial fibrillation: Secondary | ICD-10-CM | POA: Diagnosis present

## 2019-03-14 DIAGNOSIS — Z6831 Body mass index (BMI) 31.0-31.9, adult: Secondary | ICD-10-CM | POA: Insufficient documentation

## 2019-03-14 DIAGNOSIS — Z79899 Other long term (current) drug therapy: Secondary | ICD-10-CM | POA: Insufficient documentation

## 2019-03-14 DIAGNOSIS — E785 Hyperlipidemia, unspecified: Secondary | ICD-10-CM | POA: Insufficient documentation

## 2019-03-14 DIAGNOSIS — Z881 Allergy status to other antibiotic agents status: Secondary | ICD-10-CM | POA: Insufficient documentation

## 2019-03-14 DIAGNOSIS — I482 Chronic atrial fibrillation, unspecified: Secondary | ICD-10-CM | POA: Insufficient documentation

## 2019-03-14 DIAGNOSIS — E7800 Pure hypercholesterolemia, unspecified: Secondary | ICD-10-CM | POA: Insufficient documentation

## 2019-03-14 DIAGNOSIS — Z87891 Personal history of nicotine dependence: Secondary | ICD-10-CM | POA: Diagnosis not present

## 2019-03-14 DIAGNOSIS — M1990 Unspecified osteoarthritis, unspecified site: Secondary | ICD-10-CM | POA: Insufficient documentation

## 2019-03-14 DIAGNOSIS — Z888 Allergy status to other drugs, medicaments and biological substances status: Secondary | ICD-10-CM | POA: Diagnosis not present

## 2019-03-14 DIAGNOSIS — G4733 Obstructive sleep apnea (adult) (pediatric): Secondary | ICD-10-CM | POA: Diagnosis not present

## 2019-03-14 DIAGNOSIS — E119 Type 2 diabetes mellitus without complications: Secondary | ICD-10-CM | POA: Diagnosis not present

## 2019-03-14 DIAGNOSIS — Z882 Allergy status to sulfonamides status: Secondary | ICD-10-CM | POA: Insufficient documentation

## 2019-03-14 DIAGNOSIS — Z8546 Personal history of malignant neoplasm of prostate: Secondary | ICD-10-CM | POA: Insufficient documentation

## 2019-03-14 DIAGNOSIS — Z791 Long term (current) use of non-steroidal anti-inflammatories (NSAID): Secondary | ICD-10-CM | POA: Insufficient documentation

## 2019-03-14 DIAGNOSIS — I700 Atherosclerosis of aorta: Secondary | ICD-10-CM | POA: Insufficient documentation

## 2019-03-14 DIAGNOSIS — Z7984 Long term (current) use of oral hypoglycemic drugs: Secondary | ICD-10-CM | POA: Diagnosis not present

## 2019-03-14 DIAGNOSIS — I10 Essential (primary) hypertension: Secondary | ICD-10-CM | POA: Diagnosis not present

## 2019-03-14 DIAGNOSIS — I209 Angina pectoris, unspecified: Secondary | ICD-10-CM

## 2019-03-14 DIAGNOSIS — R0609 Other forms of dyspnea: Secondary | ICD-10-CM | POA: Diagnosis present

## 2019-03-14 DIAGNOSIS — E669 Obesity, unspecified: Secondary | ICD-10-CM | POA: Diagnosis not present

## 2019-03-14 DIAGNOSIS — I25118 Atherosclerotic heart disease of native coronary artery with other forms of angina pectoris: Secondary | ICD-10-CM

## 2019-03-14 DIAGNOSIS — Z9989 Dependence on other enabling machines and devices: Secondary | ICD-10-CM

## 2019-03-14 DIAGNOSIS — Z8249 Family history of ischemic heart disease and other diseases of the circulatory system: Secondary | ICD-10-CM | POA: Diagnosis not present

## 2019-03-14 HISTORY — PX: LEFT HEART CATH AND CORONARY ANGIOGRAPHY: CATH118249

## 2019-03-14 LAB — GLUCOSE, CAPILLARY: Glucose-Capillary: 217 mg/dL — ABNORMAL HIGH (ref 70–99)

## 2019-03-14 SURGERY — LEFT HEART CATH AND CORONARY ANGIOGRAPHY
Anesthesia: LOCAL

## 2019-03-14 MED ORDER — FENTANYL CITRATE (PF) 100 MCG/2ML IJ SOLN
INTRAMUSCULAR | Status: AC
  Filled 2019-03-14: qty 2

## 2019-03-14 MED ORDER — SODIUM CHLORIDE 0.9% FLUSH: 3.0000 mL | Freq: Two times a day (BID) | INTRAVENOUS | Status: DC

## 2019-03-14 MED ORDER — SODIUM CHLORIDE 0.9 % IV SOLN
INTRAVENOUS | Status: DC
  Administered 2019-03-14: 06:00:00 via INTRAVENOUS

## 2019-03-14 MED ORDER — FENTANYL CITRATE (PF) 100 MCG/2ML IJ SOLN
INTRAMUSCULAR | Status: DC | PRN
  Administered 2019-03-14: 25 ug via INTRAVENOUS

## 2019-03-14 MED ORDER — LABETALOL HCL 5 MG/ML IV SOLN: 10.0000 mg | INTRAVENOUS | Status: DC | PRN

## 2019-03-14 MED ORDER — SODIUM CHLORIDE 0.9% FLUSH: 3.0000 mL | INTRAVENOUS | Status: DC | PRN

## 2019-03-14 MED ORDER — VERAPAMIL HCL 2.5 MG/ML IV SOLN
INTRAVENOUS | Status: AC
  Filled 2019-03-14: qty 2

## 2019-03-14 MED ORDER — HEPARIN (PORCINE) IN NACL 1000-0.9 UT/500ML-% IV SOLN
INTRAVENOUS | Status: AC
  Filled 2019-03-14: qty 1000

## 2019-03-14 MED ORDER — MIDAZOLAM HCL 2 MG/2ML IJ SOLN
INTRAMUSCULAR | Status: AC
  Filled 2019-03-14: qty 2

## 2019-03-14 MED ORDER — IOHEXOL 350 MG/ML SOLN
INTRAVENOUS | Status: DC | PRN
  Administered 2019-03-14: 100 mL via INTRA_ARTERIAL

## 2019-03-14 MED ORDER — ASPIRIN 81 MG PO CHEW: 81.0000 mg | CHEWABLE_TABLET | ORAL | Status: DC

## 2019-03-14 MED ORDER — VERAPAMIL HCL 2.5 MG/ML IV SOLN
INTRAVENOUS | Status: DC | PRN
  Administered 2019-03-14: 10 mL via INTRA_ARTERIAL

## 2019-03-14 MED ORDER — HEPARIN (PORCINE) IN NACL 1000-0.9 UT/500ML-% IV SOLN
INTRAVENOUS | Status: DC | PRN
  Administered 2019-03-14 (×2): 500 mL

## 2019-03-14 MED ORDER — LIDOCAINE HCL (PF) 1 % IJ SOLN
INTRAMUSCULAR | Status: DC | PRN
  Administered 2019-03-14: 2 mL

## 2019-03-14 MED ORDER — SODIUM CHLORIDE 0.9 % IV SOLN: 250.0000 mL | INTRAVENOUS | Status: DC | PRN

## 2019-03-14 MED ORDER — ONDANSETRON HCL 4 MG/2ML IJ SOLN: 4.0000 mg | Freq: Four times a day (QID) | INTRAMUSCULAR | Status: DC | PRN

## 2019-03-14 MED ORDER — SODIUM CHLORIDE 0.9 % IV SOLN: INTRAVENOUS | Status: DC

## 2019-03-14 MED ORDER — HEPARIN SODIUM (PORCINE) 1000 UNIT/ML IJ SOLN
INTRAMUSCULAR | Status: DC | PRN
  Administered 2019-03-14: 5000 [IU] via INTRAVENOUS

## 2019-03-14 MED ORDER — HEPARIN SODIUM (PORCINE) 1000 UNIT/ML IJ SOLN
INTRAMUSCULAR | Status: AC
  Filled 2019-03-14: qty 1

## 2019-03-14 MED ORDER — LIDOCAINE HCL (PF) 1 % IJ SOLN
INTRAMUSCULAR | Status: AC
  Filled 2019-03-14: qty 30

## 2019-03-14 MED ORDER — ACETAMINOPHEN 325 MG PO TABS: 650.0000 mg | ORAL_TABLET | ORAL | Status: DC | PRN

## 2019-03-14 MED ORDER — MIDAZOLAM HCL 2 MG/2ML IJ SOLN
INTRAMUSCULAR | Status: DC | PRN
  Administered 2019-03-14: 1 mg via INTRAVENOUS

## 2019-03-14 MED ORDER — HYDRALAZINE HCL 20 MG/ML IJ SOLN: 10.0000 mg | INTRAMUSCULAR | Status: DC | PRN

## 2019-03-14 SURGICAL SUPPLY — 11 items
CATH 5FR JL3.5 JR4 ANG PIG MP (CATHETERS) ×1 IMPLANT
CATH INFINITI 5FR JL4 (CATHETERS) ×1 IMPLANT
DEVICE RAD COMP TR BAND LRG (VASCULAR PRODUCTS) ×1 IMPLANT
GLIDESHEATH SLEND A-KIT 6F 22G (SHEATH) ×1 IMPLANT
GUIDEWIRE INQWIRE 1.5J.035X260 (WIRE) IMPLANT
INQWIRE 1.5J .035X260CM (WIRE) ×2
KIT HEART LEFT (KITS) ×2 IMPLANT
PACK CARDIAC CATHETERIZATION (CUSTOM PROCEDURE TRAY) ×2 IMPLANT
SHEATH PROBE COVER 6X72 (BAG) ×1 IMPLANT
TRANSDUCER W/STOPCOCK (MISCELLANEOUS) ×2 IMPLANT
TUBING CIL FLEX 10 FLL-RA (TUBING) ×2 IMPLANT

## 2019-03-14 NOTE — Interval H&P Note (Signed)
Cath Lab Visit (complete for each Cath Lab visit)  Clinical Evaluation Leading to the Procedure:   ACS: No.  Non-ACS:    Anginal Classification: CCS Benitez  Anti-ischemic medical therapy: Minimal Therapy (1 class of medications)  Non-Invasive Test Results: Intermediate-risk stress test findings: cardiac mortality 1-3%/year  Prior CABG: No previous CABG      History and Physical Interval Note:  03/14/2019 7:24 AM  [name]  has presented today for surgery, with the diagnosis of unstable angina - abnormal ct.  The various methods of treatment have been discussed with the patient and family. After consideration of risks, benefits and other options for treatment, the patient has consented to  Procedure(s): LEFT HEART CATH AND CORONARY ANGIOGRAPHY (N/A) as a surgical intervention.  The patient's history has been reviewed, patient examined, no change in status, stable for surgery.  I have reviewed the patient's chart and labs.  Questions were answered to the patient's satisfaction.     [name]

## 2019-03-14 NOTE — CV Procedure (Addendum)
   Coronary angiography via right radial using real-time vascular ultrasound for access.  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  Low normal LV function with EF in the 45 to 50% range.  Recommend up titration of medical therapy.  Add long-acting nitrates.  If unable to achieve reasonable symptom control, consideration of multivessel bypass would be in order.

## 2019-03-14 NOTE — Discharge Instructions (Signed)
DRINK PLENTY OF FLUIDS FOR THE NEXT 2-3 DAYS.  KEEP AFFECTED ARM ELEVATED FOR THE REMAINDER OF THE DAY.   HOLD METFORMIN FOR A FULL 48 HOURS AFTER DISCHARGE.   Radial Site Care  This sheet gives you information about how to care for yourself after your procedure. Your health care provider may also give you more specific instructions. If you have problems or questions, contact your health care provider. What can I expect after the procedure? After the procedure, it is common to have:  Bruising and tenderness at the catheter insertion area. Follow these instructions at home: Medicines  Take over-the-counter and prescription medicines only as told by your health care provider. Insertion site care  Follow instructions from your health care provider about how to take care of your insertion site. Make sure you: ? Wash your hands with soap and water before you change your bandage (dressing). If soap and water are not available, use hand sanitizer. ? Change your dressing as told by your health care provider.  Check your insertion site every day for signs of infection. Check for: ? Redness, swelling, or pain. ? Fluid or blood. ? Pus or a bad smell. ? Warmth.  Do not take baths, swim, or use a hot tub until your health care provider approves.  You may shower 24-48 hours after the procedure. ? Remove the dressing and gently wash the site with plain soap and water. ? Pat the area dry with a clean towel. ? Do not rub the site. That could cause bleeding.  Do not apply powder or lotion to the site. Activity   For 24 hours after the procedure, or as directed by your health care provider: ? Do not flex or bend the affected arm. ? Do not push or pull heavy objects with the affected arm. ? Do not drive yourself home from the hospital or clinic. You may drive 24 hours after the procedure unless your health care provider tells you not to. ? Do not operate machinery or power tools.  Do not  lift anything that is heavier than 10 lb for 5 days.  Ask your health care provider when it is okay to: ? Return to work or school. ? Resume usual physical activities or sports. ? Resume sexual activity. General instructions  If the catheter site starts to bleed, raise your arm and put firm pressure on the site. If the bleeding does not stop, get help right away. This is a medical emergency.  If you went home on the same day as your procedure, a responsible adult should be with you for the first 24 hours after you arrive home.  Keep all follow-up visits as told by your health care provider. This is important. Contact a health care provider if:  You have a fever.  You have redness, swelling, or yellow drainage around your insertion site. Get help right away if:  You have unusual pain at the radial site.  The catheter insertion area swells very fast.  The insertion area is bleeding, and the bleeding does not stop when you hold steady pressure on the area.  Your arm or hand becomes pale, cool, tingly, or numb. These symptoms may represent a serious problem that is an emergency. Do not wait to see if the symptoms will go away. Get medical help right away. Call your local emergency services (911 in the [place] Do not drive yourself to the hospital. Summary  After the procedure, it is common to have bruising and  tenderness at the site.  Follow instructions from your health care provider about how to take care of your radial site wound. Check the wound every day for signs of infection.  Do not lift anything that is heavier than 10 lb for 5 days..  This information is not intended to replace advice given to you by your health care provider. Make sure you discuss any questions you have with your health care provider. Document Released: 06/03/2010 Document Revised: 06/06/2017 Document Reviewed: 06/06/2017 Elsevier Patient Education  2020 [employer].

## 2019-03-17 ENCOUNTER — Other Ambulatory Visit: Payer: Self-pay | Admitting: *Deleted

## 2019-03-17 ENCOUNTER — Telehealth: Payer: Self-pay | Admitting: *Deleted

## 2019-03-17 MED ORDER — ISOSORBIDE MONONITRATE ER 30 MG PO TB24: 30.0000 mg | ORAL_TABLET | Freq: Every day | ORAL | 6 refills | Status: DC

## 2019-03-17 NOTE — Telephone Encounter (Signed)
S/w pt is aware of recommendation's. Will start imdur one tablet (30 mg) qhs with tylenol if needed.  Sent in to requested pharmacy.  Confirmed pt's upcoming appt with Truitt Merle, NP.

## 2019-03-17 NOTE — Telephone Encounter (Signed)
-----   Message from Burtis Junes, NP sent at 03/14/2019  9:11 AM EDT ----- Let's try starting him on Imdur 30 mg a day - caution about headache - ok to take at night and use Tylenol.   We are going to try and treat him medically for his chest pain.   See back as planned.   Burtis Junes, RN, [place] [address] [place], [place]  [phone]

## 2019-03-24 ENCOUNTER — Telehealth: Payer: Self-pay | Admitting: Interventional Cardiology

## 2019-03-24 NOTE — Telephone Encounter (Signed)
Patient would like to speak with someone in regards to his medication. States he was taken off of celecoxib and would like to be put back on

## 2019-03-24 NOTE — Telephone Encounter (Signed)
S/w pt is concerned if pt does not go back on Celebrex won't be able to move hands and hip.  Stated Celebrex is not good for cardiac issues or to be on with Eliquis due to bleeding issues.  Pt is advised to take Extended release Tylenol until further discussion with Truitt Merle, NP next week with upcoming appt.

## 2019-03-27 ENCOUNTER — Ambulatory Visit: Payer: Medicare Other | Admitting: Neurology

## 2019-03-27 NOTE — Progress Notes (Signed)
CARDIOLOGY OFFICE NOTE  Date:  04/02/2019    [name] Date of Birth: May 19, 1945 Medical Record [id number]  PCP:  Leanna Battles, MD  Cardiologist:  Servando Snare     Chief Complaint  Patient presents with  . Follow-up    History of Present Illness: [name] is a 73 y.o. male who presents today for a post cath visit.  Former patient of Dr. Claris Gladden andDr. Tennant's.He basically follows with me.I see his wife as well.  He has chronic atrial fibrillation that has recurred after multiple cardioversions. Hewas previously onCoumadinand then switched over to Eliquis. He had a low risk Myoview inOctoberof 2016.He has had coronary calcifications noted on prior CT scans.He also has OSA on CPAP, hypertension, hyperlipidemia, obesity.Last CT of the chest from 10/2017 - to repeat in 2 years.   I have followed him over the past several years. He had more issues with back pain with associated HTN - we adjusted his medicines. He had his back fixed - BP came down and we were able to titrate down and even stop some of his antihypertensives. Last seenhere in the officeback inDecember and he was doing well.We did a telehealth visit back in June - his ACE had been changed to ARB due to cough. Otherwise was doing ok. Stable DOE.  Seen here earlier in October - noted a new onset of exertional tightness in the his chest - coronary CT was arranged - this is abnormal with +FFR noted. Referred for cardiac cath - to try and manage medically with aggressive CV risk factor modification but could entertain CABG if fails to improve on medical therapy.   The patient does not have symptoms concerning for COVID-19 infection (fever, chills, cough, or new shortness of breath).   Comes in today. Here alone. [name] - his wife - is on the phone with [place]. He is doing ok from our standpoint - it is his arthritis that is his most limiting factor. Asking about Tylenol arthritis. He is now able to walk up  his driveway to his front door which has a steep incline and 4 steps now without any chest discomfort and does not have to stop to rest - this is a definite improvement.   Past Medical History:  Diagnosis Date  . A-fib ([place])   . Arrhythmia    hx. Atrial Fib. ,"with regurgitation"  . Depression   . Diabetes mellitus   . Dysrhythmia    a. fib-on Eliquis  . Heart murmur   . Hx of echocardiogram    Echo 3/16:  Mild LVH, EF 60-65%, mild MR, severe LAE, mild RAE, PASP 32 mmHg  . Hypercholesteremia   . Hypertension   . Macular degeneration of both eyes   . OSA on CPAP   . Osteoarthritis (arthritis due to wear and tear of joints)   . Palpitations   . Prostate cancer ([place])    seed implantion- 15 to 20 yrs ago  . Sleep apnea    uses CPAP-settings 5  . Ureter, stricture    hx. 2 yrs ago-stent has been removed now    Past Surgical History:  Procedure Laterality Date  . COLONOSCOPY  2007  . CYSTOSCOPY W/ RETROGRADES  04/04/2012   Procedure: CYSTOSCOPY WITH RETROGRADE PYELOGRAM;  Surgeon: Molli Hazard, MD;  Location: [facility];  Service: Urology;  Laterality: Bilateral;  . CYSTOSCOPY WITH RETROGRADE PYELOGRAM, URETEROSCOPY AND STENT PLACEMENT  04/04/2012   Procedure: CYSTOSCOPY WITH RETROGRADE PYELOGRAM, URETEROSCOPY AND  STENT PLACEMENT;  Surgeon: Molli Hazard, MD;  Location: [facility];  Service: Urology;  Laterality: Right;  . JOINT REPLACEMENT Left   . LEFT HEART CATH AND CORONARY ANGIOGRAPHY N/A 03/14/2019   Procedure: LEFT HEART CATH AND CORONARY ANGIOGRAPHY;  Surgeon: Belva Crome, MD;  Location: [facility];  Service: Cardiovascular;  Laterality: N/A;  . LUMBAR LAMINECTOMY/DECOMPRESSION MICRODISCECTOMY N/A 09/27/2017   Procedure: LAMINECTOMY LUMBAR THREE- LUMBAR FOUR, LUMBAR FOUR- LUMBAR FIVE;  Surgeon: Ashok Pall, MD;  Location: [facility];  Service: Neurosurgery;  Laterality: N/A;  . ROTATOR CUFF REPAIR Left   . seed implants    . TOTAL KNEE ARTHROPLASTY Left  08/06/2014   Procedure: LEFT TOTAL KNEE ARTHROPLASTY;  Surgeon: Susa Day, MD;  Location: [facility];  Service: Orthopedics;  Laterality: Left;     Medications: Current Meds  Medication Sig  . allopurinol (ZYLOPRIM) 300 MG tablet Take 300 mg by mouth daily.  Marland Kitchen atenolol (TENORMIN) 25 MG tablet Take 1 tablet (25 mg total) by mouth as directed.  Marland Kitchen atenolol-chlorthalidone (TENORETIC) 50-25 MG per tablet Take 0.5 tablets by mouth daily.   Marland Kitchen atorvastatin (LIPITOR) 10 MG tablet Take 10 mg by mouth at bedtime.   . colchicine 0.6 MG tablet Take 1 tablet by mouth as needed (gout).   Marland Kitchen ELIQUIS 5 MG TABS tablet TAKE 1 TABLET BY MOUTH TWO  TIMES DAILY  . isosorbide mononitrate (IMDUR) 30 MG 24 hr tablet Take 1 tablet (30 mg total) by mouth daily.  Marland Kitchen losartan (COZAAR) 50 MG tablet Take 25 mg by mouth 2 (two) times daily.   . metFORMIN (GLUCOPHAGE) 1000 MG tablet Take 500 mg by mouth daily with breakfast.  . Multiple Vitamin (MULTIVITAMIN WITH MINERALS) TABS tablet Take 1 tablet by mouth daily. Centrum Silver  . Multiple Vitamins-Minerals (PRESERVISION AREDS 2) CAPS Take 1 capsule by mouth 2 (two) times daily.   . nitroGLYCERIN (NITROSTAT) 0.4 MG SL tablet Place 1 tablet (0.4 mg total) under the tongue every 5 (five) minutes as needed for chest pain.  . traMADol (ULTRAM) 50 MG tablet Take 50 mg by mouth 2 (two) times daily. arthritis      Allergies: Allergies  Allergen Reactions  . Lisinopril Cough  . Sulfa Drugs Cross Reactors     CHILDHOOD UNSPECIFIED REACTION   . Contrast Media [Iodinated Diagnostic Agents] Rash  . Doxycycline Itching  . Ioxaglate Rash  . Tetracyclines & Related Rash    Social History: The patient  reports that he quit smoking about 45 years ago. He has never used smokeless tobacco. He reports current alcohol use. He reports that he does not use drugs.   Family History: The patient's family history includes Cancer in his mother; Colon cancer in his mother; Dementia in his  mother; Heart attack in his father and sister; Stroke in his sister.   Review of Systems: Please see the history of present illness.   All other systems are reviewed and negative.   Physical Exam: VS:  BP 130/78 (BP Location: Left Arm, Patient Position: Sitting, Cuff Size: Normal)   Pulse 61   Ht 6' (1.829 m)   Wt 235 lb 6.4 oz (106.8 kg)   SpO2 97% Comment: at rest  BMI 31.93 kg/m  .  BMI Body mass index is 31.93 kg/m.  Wt Readings from Last 3 Encounters:  04/02/19 235 lb 6.4 oz (106.8 kg)  03/14/19 230 lb (104.3 kg)  02/19/19 236 lb 12.8 oz (107.4 kg)    General:  Pleasant. Well developed, well nourished and in no acute distress.   HEENT: Normal.  Neck: Supple, no JVD, carotid bruits, or masses noted.  Cardiac: Irregular irregular rhythm. Rate is fine.  No murmurs, rubs, or gallops. No edema.  Respiratory:  Lungs are clear to auscultation bilaterally with normal work of breathing.  GI: Soft and nontender.  MS: No deformity or atrophy. Gait and ROM intact.  Skin: Warm and dry. Color is normal.  Neuro:  Strength and sensation are intact and no gross focal deficits noted.  Psych: Alert, appropriate and with normal affect.   LABORATORY DATA:  EKG:  EKG is not ordered today.  Lab Results  Component Value Date   WBC 6.3 03/10/2019   HGB 15.7 03/10/2019   HCT 45.0 03/10/2019   PLT 136 (L) 03/10/2019   GLUCOSE 106 (H) 03/10/2019   CHOL 137 10/28/2018   TRIG 215 (H) 10/28/2018   HDL 33 (L) 10/28/2018   LDLCALC 61 10/28/2018   ALT 61 (H) 03/10/2019   AST 31 03/10/2019   NA 143 03/10/2019   K 4.1 03/10/2019   CL 104 03/10/2019   CREATININE 1.24 03/10/2019   BUN 29 (H) 03/10/2019   CO2 22 03/10/2019   INR 1.13 09/27/2017   HGBA1C 5.1 09/18/2017     BNP (last 3 results) No results for input(s): BNP in the last 8760 hours.  ProBNP (last 3 results) No results for input(s): PROBNP in the last 8760 hours.   Other Studies Reviewed Today:  LEFT HEART CATH AND  CORONARY ANGIOGRAPHY 03/14/2019  Conclusion   Moderately severe three-vessel coronary disease.  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  2 small obtuse marginal branch is supplied on the left lateral wall.  40% proximal LAD with 60 to 70% diffuse mid to distal LAD.  First diagonal contains eccentric 50 to 70% narrowing.  Right coronary is diffusely diseased with mid 60 to 65% stenosis.  Distal 75% stenosis before small PDA.  99% stenosis in the continuation of the right coronary before the second PLA.  The acute marginal branch of the right coronary has multifocal 95% stenoses.  Low normal LVEF approximately 50% with normal EDP.  RECOMMENDATIONS:   Aggressive risk preventive therapy with LDL target less than 55.  Add long-acting nitrate therapy to improve exertional dyspnea and chest tightness.  If unacceptable symptoms, would consider multivessel coronary bypass grafting.  In absence of high-grade proximal CAD, I feel a measured approach attempting to control symptoms with medications is a reasonable first option.    CORONARY CT IMPRESSION 02/2019: 1. Coronary calcium score of 1242. This was 21 percentile for age and sex matched control.  2. 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 Right dominance.  3. Study quality is affected by poor vasodilation, diffuse calcifications and motion associated with atrial fibrillation. However, there is severe stenosis in the mid RCA and possibly in the distal RCA and mid LAD. Additional analysis with CT FFR will be submitted. CAD-RADS 4.  4. Mildly dilated pulmonary artery measuring 31 mm.  5. There is a filling defect in the most antero-superior portion of the left atrial appendage, this might represent a poor contrast mixing on first pass imaging vs a thrombus.   Electronically Signed By: Ena Dawley On: 03/03/2019  12:54   IMPRESSION: 1. CT FFR analysis showed severe stenosis in the mid LAD, small PLA and occluded proximal portion of PDA. A cardiac catheterization  is recommended. 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   Electronically Signed By: Stephens November, MD sent to Burtis Junes, NP        I wouldn't, its a first pass image, we have to comment on it, in 95% of cases its just poor contrast mixing, if the patient is complaint with the anticoagulation, then its highly unlikely to have a thrombus.      CT CHEST 10/2017 IMPRESSION: Stable ground-glass nodular density is noted in right middle lobe. Unenhanced CT scan in 2 years is recommended to ensure stability. This recommendation follows the consensus statement: Guidelines for Management of Incidental Pulmonary Nodules Detected on CT Images: From the Fleischner Society 2017; Radiology 2017; 284:228-243.  Coronary artery calcifications are noted suggesting coronary artery disease.  Stable other bilateral pulmonary nodules are noted which can be considered benign at this point.  Cholelithiasis.  Aortic Atherosclerosis (ICD10-I70.0).  Electronically Signed By: Marijo Conception, M.D. On: 11/07/2017 14:36   Echo Study Conclusions from 07/2014  - Left ventricle: The cavity size was normal. Wall thickness was increased in a pattern of mild LVH. Systolic function was normal. The estimated ejection fraction was in the range of 60% to 65%. - Mitral valve: There was mild regurgitation. - Left atrium: The atrium was severely dilated. - Right atrium: The atrium was mildly dilated. - Pulmonary arteries: PA peak pressure: 32 mm Hg (S).      Myoview Study Highlights from 02/2015    Defect 1: There is a small defect of mild severity present in the basal inferior and basal inferolateral location.  There was no ST segment deviation  noted during stress.  Thinning in the inferior / inferolateral region (base) Otherwise normal perfusion. May represent soft tissue attenuation vs small region of scar Images not gated.  Overall low risk scan.        ASSESSMENT & PLAN:   1.Chest pressure - with exertion/incline - with multiple CV risk factors, chronically abnormal EKG and abnormal coronary CT - now s/p cath - moderate 3VD - will try to manage medically. He has been placed on Imdur - he has had improvement in symptoms. Goal is to work on CV risk factor modification - needs weight loss, lower triglycerides, and regular exercise program. Will leave on current dose of Imdur for now.   2.Persistent AF-he is managed with rate control and continued anticoagulation with Eliquis. No bleeding. He is now off Celebrex.   3. HTN - BP ok here today - will follow.   4. Obesity - discussed at length. They both seem motivated to make changes.   5. Chronic anticoagulation - no problems noted.   6. HLD - on statin - LDL is at goal - he needs to work on diet to get his triglycerides improved.   7.Lung nodule - needs repeat CT 10/2019-not discussed today.   8. Significant OA - will try Voltaren gel, try tylenol arthritis. Rare use of Celebrex.  9. COVID-19 Education: The signs and symptoms of COVID-19 were discussed with the patient and how to seek care for testing (follow up with PCP or arrange E-visit).  The importance of social distancing, staying at home, hand hygiene and wearing a mask when out in public were discussed today.  Current medicines are reviewed with the patient today.  The patient does not have concerns regarding medicines other than what has been noted above.  The following changes have been  made:  See above.  Labs/ tests ordered today include:    Orders Placed This Encounter  Procedures  . Comprehensive metabolic panel     Disposition:   FU with me as planned in December.     Patient is agreeable to this plan and will call if any problems develop in the interim.   SignedTruitt Merle, NP  04/02/2019 4:18 PM  [facility] [address] [place], [place]  [address] Phone: [phone] Fax: [phone]

## 2019-04-02 ENCOUNTER — Encounter: Payer: Self-pay | Admitting: Nurse Practitioner

## 2019-04-02 ENCOUNTER — Ambulatory Visit: Payer: Medicare Other | Admitting: Nurse Practitioner

## 2019-04-02 ENCOUNTER — Other Ambulatory Visit: Payer: Self-pay

## 2019-04-02 VITALS — BP 130/78 | HR 61 | Ht 72.0 in | Wt 235.4 lb

## 2019-04-02 DIAGNOSIS — Z9889 Other specified postprocedural states: Secondary | ICD-10-CM | POA: Diagnosis not present

## 2019-04-02 DIAGNOSIS — Z7189 Other specified counseling: Secondary | ICD-10-CM

## 2019-04-02 DIAGNOSIS — I259 Chronic ischemic heart disease, unspecified: Secondary | ICD-10-CM

## 2019-04-02 DIAGNOSIS — I482 Chronic atrial fibrillation, unspecified: Secondary | ICD-10-CM | POA: Diagnosis not present

## 2019-04-02 DIAGNOSIS — E785 Hyperlipidemia, unspecified: Secondary | ICD-10-CM

## 2019-04-02 DIAGNOSIS — I10 Essential (primary) hypertension: Secondary | ICD-10-CM

## 2019-04-02 DIAGNOSIS — R0789 Other chest pain: Secondary | ICD-10-CM

## 2019-04-02 NOTE — Patient Instructions (Addendum)
After Visit Summary:  We will be checking the following labs today - BMET   Medication Instructions:    Continue with your current medicines.    If you need a refill on your cardiac medications before your next appointment, please call your pharmacy.     Testing/Procedures To Be Arranged:  N/A  Follow-Up:   See me as planned in December with Pat    At [facility], you and your health needs are our priority.  As part of our continuing mission to provide you with exceptional heart care, we have created designated Provider Care Teams.  These Care Teams include your primary Cardiologist (physician) and Advanced Practice Providers (APPs -  Physician Assistants and Nurse Practitioners) who all work together to provide you with the care you need, when you need it.  Special Instructions:  . Stay safe, stay home, wash your hands for at least 20 seconds and wear a mask when out in public.  . It was good to talk with you both today. Madaline Brilliant to try Voltaren gel - this is the over the counter . Ok to use Tylenol Arthritis - follow the directions on the bottle . Walking every day - goal 45 minutes . Let's start working towards a weight of 225#   Call the [place] office at [phone] if you have any questions, problems or concerns.

## 2019-04-03 LAB — COMPREHENSIVE METABOLIC PANEL
ALT: 30 IU/L (ref 0–44)
AST: 32 IU/L (ref 0–40)
Albumin/Globulin Ratio: 2.5 — ABNORMAL HIGH (ref 1.2–2.2)
Albumin: 4.7 g/dL (ref 3.7–4.7)
Alkaline Phosphatase: 69 IU/L (ref 39–117)
BUN/Creatinine Ratio: 19 (ref 10–24)
BUN: 26 mg/dL (ref 8–27)
Bilirubin Total: 0.7 mg/dL (ref 0.0–1.2)
CO2: 25 mmol/L (ref 20–29)
Calcium: 9.3 mg/dL (ref 8.6–10.2)
Chloride: 102 mmol/L (ref 96–106)
Creatinine, Ser: 1.35 mg/dL — ABNORMAL HIGH (ref 0.76–1.27)
GFR calc Af Amer: 59 mL/min/{1.73_m2} — ABNORMAL LOW (ref >59)
GFR calc non Af Amer: 51 mL/min/{1.73_m2} — ABNORMAL LOW (ref >59)
Globulin, Total: 1.9 g/dL (ref 1.5–4.5)
Glucose: 93 mg/dL (ref 65–99)
Potassium: 3.9 mmol/L (ref 3.5–5.2)
Sodium: 141 mmol/L (ref 134–144)
Total Protein: 6.6 g/dL (ref 6.0–8.5)

## 2019-04-17 NOTE — Progress Notes (Signed)
CARDIOLOGY OFFICE NOTE  Date:  04/22/2019    [name] Date of Birth: 11-14-1945 Medical Record [id number]  PCP:  Sean Battles, MD  Cardiologist:  Servando Snare  Chief Complaint  Patient presents with  . Follow-up    History of Present Illness: [name] is a 73 y.o. male who presents today for a follow up visit.  Former patient of Dr. Claris Gladden andDr. Tennant's.He basically follows with me.I see his wife as well.  He has chronic atrial fibrillation that has recurred after multiple cardioversions. Hewas previously onCoumadinand then switched over to Eliquis. He had a low risk Myoview inOctoberof 2016.He has had coronary calcifications noted on prior CT scans.He also has OSA on CPAP, hypertension, hyperlipidemia, obesity.Last CT of the chest from 10/2017 - to repeat in 2 years.   I have followed him over the past several years. He had more issues with back pain with associated HTN - we adjusted his medicines. He had his back fixed - BP came down and we were able to titrate down and even stop some of his antihypertensives. Last seenhere in the officeback inDecember and he was doing well.We did a telehealth visit back in June - his ACE had been changed to ARB due to cough. Otherwise was doing ok. Stable DOE.  Seen here earlier in October - noted a new onset of exertional tightness in the his chest - coronary CT was arranged - this is abnormal with +FFR noted.Referred for cardiac cath - to try and manage medically with aggressive CV risk factor modification but could entertain CABG if fails to improve on medical therapy.   I saw him back for his post cath visit last month. He was doing ok - very limited due to his arthritis and limitations on what he can take for that.   The patient does not have symptoms concerning for COVID-19 infection (fever, chills, cough, or new shortness of breath).   Comes in today. Here with his wife - I am seeing her as well. He has  had his dose of Losartan increased by PCP for increasing BP. Just started this a few days ago. He notes he still has DOE - certainly worse with inclines and one particular hill in their neighborhood. They are now "driving up the hill" - so that then they can try to walk. The weather is now cold - this is impacting his ability to walk. He had a spell last week - had been outside - does not remember what he had been doing - but came in and sat on the sofa - did not "feel right" - does note his chest felt tight. [name] advised him to use sl NTG - this made him feel better - this has not recurred. He still has lots of back/arthritis issues.   Past Medical History:  Diagnosis Date  . A-fib ([place])   . Arrhythmia    hx. Atrial Fib. ,"with regurgitation"  . Depression   . Diabetes mellitus   . Dysrhythmia    a. fib-on Eliquis  . Heart murmur   . Hx of echocardiogram    Echo 3/16:  Mild LVH, EF 60-65%, mild MR, severe LAE, mild RAE, PASP 32 mmHg  . Hypercholesteremia   . Hypertension   . Macular degeneration of both eyes   . OSA on CPAP   . Osteoarthritis (arthritis due to wear and tear of joints)   . Palpitations   . Prostate cancer ([place])    seed  implantion- 15 to 20 yrs ago  . Sleep apnea    uses CPAP-settings 5  . Ureter, stricture    hx. 2 yrs ago-stent has been removed now    Past Surgical History:  Procedure Laterality Date  . COLONOSCOPY  2007  . CYSTOSCOPY W/ RETROGRADES  04/04/2012   Procedure: CYSTOSCOPY WITH RETROGRADE PYELOGRAM;  Surgeon: Molli Hazard, MD;  Location: [facility];  Service: Urology;  Laterality: Bilateral;  . CYSTOSCOPY WITH RETROGRADE PYELOGRAM, URETEROSCOPY AND STENT PLACEMENT  04/04/2012   Procedure: CYSTOSCOPY WITH RETROGRADE PYELOGRAM, URETEROSCOPY AND STENT PLACEMENT;  Surgeon: Molli Hazard, MD;  Location: [facility];  Service: Urology;  Laterality: Right;  . JOINT REPLACEMENT Left   . LEFT HEART CATH AND CORONARY ANGIOGRAPHY N/A 03/14/2019    Procedure: LEFT HEART CATH AND CORONARY ANGIOGRAPHY;  Surgeon: Belva Crome, MD;  Location: [facility];  Service: Cardiovascular;  Laterality: N/A;  . LUMBAR LAMINECTOMY/DECOMPRESSION MICRODISCECTOMY N/A 09/27/2017   Procedure: LAMINECTOMY LUMBAR THREE- LUMBAR FOUR, LUMBAR FOUR- LUMBAR FIVE;  Surgeon: Ashok Pall, MD;  Location: [facility];  Service: Neurosurgery;  Laterality: N/A;  . ROTATOR CUFF REPAIR Left   . seed implants    . TOTAL KNEE ARTHROPLASTY Left 08/06/2014   Procedure: LEFT TOTAL KNEE ARTHROPLASTY;  Surgeon: Susa Day, MD;  Location: [facility];  Service: Orthopedics;  Laterality: Left;     Medications: Current Meds  Medication Sig  . allopurinol (ZYLOPRIM) 300 MG tablet Take 300 mg by mouth daily.  Marland Kitchen atenolol-chlorthalidone (TENORETIC) 50-25 MG per tablet Take 0.5 tablets by mouth daily.   Marland Kitchen atorvastatin (LIPITOR) 10 MG tablet Take 10 mg by mouth at bedtime.   . colchicine 0.6 MG tablet Take 1 tablet by mouth as needed (gout).   Marland Kitchen ELIQUIS 5 MG TABS tablet TAKE 1 TABLET BY MOUTH TWO  TIMES DAILY  . losartan (COZAAR) 100 MG tablet Take 100 mg by mouth daily.  . metFORMIN (GLUCOPHAGE) 1000 MG tablet Take 500 mg by mouth daily with breakfast.  . Multiple Vitamin (MULTIVITAMIN WITH MINERALS) TABS tablet Take 1 tablet by mouth daily. Centrum Silver  . Multiple Vitamins-Minerals (PRESERVISION AREDS 2) CAPS Take 1 capsule by mouth 2 (two) times daily.   . nitroGLYCERIN (NITROSTAT) 0.4 MG SL tablet Place 1 tablet (0.4 mg total) under the tongue every 5 (five) minutes as needed for chest pain.  . traMADol (ULTRAM) 50 MG tablet Take 50 mg by mouth 2 (two) times daily. arthritis   . [DISCONTINUED] isosorbide mononitrate (IMDUR) 30 MG 24 hr tablet Take 1 tablet (30 mg total) by mouth daily.     Allergies: Allergies  Allergen Reactions  . Lisinopril Cough  . Sulfa Drugs Cross Reactors     CHILDHOOD UNSPECIFIED REACTION   . Contrast Media [Iodinated Diagnostic Agents] Rash  .  Doxycycline Itching  . Ioxaglate Rash  . Tetracyclines & Related Rash    Social History: The patient  reports that he quit smoking about 45 years ago. He has never used smokeless tobacco. He reports current alcohol use. He reports that he does not use drugs.   Family History: The patient's family history includes Cancer in his mother; Colon cancer in his mother; Dementia in his mother; Heart attack in his father and sister; Stroke in his sister.   Review of Systems: Please see the history of present illness.   All other systems are reviewed and negative.   Physical Exam: VS:  BP 120/78   Pulse (!) 58  Ht 6' (1.829 m)   Wt 232 lb 1.9 oz (105.3 kg)   SpO2 96%   BMI 31.48 kg/m  .  BMI Body mass index is 31.48 kg/m.  Wt Readings from Last 3 Encounters:  04/22/19 232 lb 1.9 oz (105.3 kg)  04/02/19 235 lb 6.4 oz (106.8 kg)  03/14/19 230 lb (104.3 kg)    General: Pleasant. Well developed, well nourished and in no acute distress.  He has lost a few pounds.  HEENT: Normal.  Neck: Supple, no JVD, carotid bruits, or masses noted.  Cardiac: Irregular irregular rhythm. Rate is ok. No murmurs, rubs, or gallops. No edema.  Respiratory:  Lungs are clear to auscultation bilaterally with normal work of breathing.  GI: Soft and nontender.  MS: No deformity or atrophy. Gait and ROM intact.  Skin: Warm and dry. Color is normal.  Neuro:  Strength and sensation are intact and no gross focal deficits noted.  Psych: Alert, appropriate and with normal affect.   LABORATORY DATA:  EKG:  EKG is not ordered today.   Lab Results  Component Value Date   WBC 6.3 03/10/2019   HGB 15.7 03/10/2019   HCT 45.0 03/10/2019   PLT 136 (L) 03/10/2019   GLUCOSE 93 04/02/2019   CHOL 137 10/28/2018   TRIG 215 (H) 10/28/2018   HDL 33 (L) 10/28/2018   LDLCALC 61 10/28/2018   ALT 30 04/02/2019   AST 32 04/02/2019   NA 141 04/02/2019   K 3.9 04/02/2019   CL 102 04/02/2019   CREATININE 1.35 (H)  04/02/2019   BUN 26 04/02/2019   CO2 25 04/02/2019   INR 1.13 09/27/2017   HGBA1C 5.1 09/18/2017       BNP (last 3 results) No results for input(s): BNP in the last 8760 hours.  ProBNP (last 3 results) No results for input(s): PROBNP in the last 8760 hours.   Other Studies Reviewed Today:  LEFT HEART CATH AND CORONARY ANGIOGRAPHY 03/14/2019  Conclusion   Moderately severe three-vessel coronary disease.  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 2 small obtuse marginal branch is supplied on the left lateral wall.  40% proximal LAD with 60 to 70% diffuse mid to distal LAD. First diagonal contains eccentric 50 to 70% narrowing.  Right coronary is diffusely diseased with mid 60 to 65% stenosis. Distal 75% stenosis before small PDA. 99% stenosis in the continuation of the right coronary before the second PLA. The acute marginal branch of the right coronary has multifocal 95% stenoses.  Low normal LVEF approximately 50% with normal EDP.  RECOMMENDATIONS:   Aggressive risk preventive therapy with LDL target less than 55.  Add long-acting nitrate therapy to improve exertional dyspnea and chest tightness.  If unacceptable symptoms, would consider multivessel coronary bypass grafting. In absence of high-grade proximal CAD, I feel a measured approach attempting to control symptoms with medications is a reasonable first option.    CORONARY CTIMPRESSION10/2020: 1. Coronary calcium score of 1242. This was 55 percentile for age and sex matched control.  2. 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 Right dominance.  3. Study quality is affected by poor vasodilation, diffuse calcifications and motion associated with atrial fibrillation. However, there is severe stenosis in the mid RCA and possibly in the distal RCA and mid LAD. Additional analysis with CT FFR will be  submitted. CAD-RADS 4.  4. Mildly dilated pulmonary artery measuring 31 mm.  5. There  is a filling defect in the most antero-superior portion of the left atrial appendage, this might represent a poor contrast mixing on first pass imaging vs a thrombus.   Electronically Signed By: Ena Dawley On: 03/03/2019 12:54   IMPRESSION: 1. CT FFR analysis showed severe stenosis in the mid LAD, small PLA and occluded proximal portion of PDA. A cardiac catheterization is recommended. 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   Electronically Signed By: Stephens November, MD sent to Burtis Junes, NP        I wouldn't, its a first pass image, we have to comment on it, in 95% of cases its just poor contrast mixing, if the patient is complaint with the anticoagulation, then its highly unlikely to have a thrombus.     CT CHEST 10/2017 IMPRESSION: Stable ground-glass nodular density is noted in right middle lobe. Unenhanced CT scan in 2 years is recommended to ensure stability. This recommendation follows the consensus statement: Guidelines for Management of Incidental Pulmonary Nodules Detected on CT Images: From the Fleischner Society 2017; Radiology 2017; 284:228-243.  Coronary artery calcifications are noted suggesting coronary artery disease.  Stable other bilateral pulmonary nodules are noted which can be considered benign at this point.  Cholelithiasis.  Aortic Atherosclerosis (ICD10-I70.0).  Electronically Signed By: Marijo Conception, M.D. On: 11/07/2017 14:36   Echo Study Conclusions from 07/2014  - Left ventricle: The cavity size was normal. Wall thickness was increased in a pattern of mild LVH. Systolic function was normal. The estimated ejection fraction was in the range of 60% to 65%. - Mitral valve: There was mild regurgitation. - Left atrium: The atrium was  severely dilated. - Right atrium: The atrium was mildly dilated. - Pulmonary arteries: PA peak pressure: 32 mm Hg (S).      Myoview Study Highlights from 02/2015    Defect 1: There is a small defect of mild severity present in the basal inferior and basal inferolateral location.  There was no ST segment deviation noted during stress.  Thinning in the inferior / inferolateral region (base) Otherwise normal perfusion. May represent soft tissue attenuation vs small region of scar Images not gated.  Overall low risk scan.        ASSESSMENT & PLAN:   1.Chest pressure - with exertion/incline - with multiple CV risk factors, chronically abnormal EKG and abnormal coronary CT - now s/p cath - showing moderate 3VD - trying to manage medically. Hard to actually gauge if he has had improvement - with his wife augmenting the history I think that we do not have his symptoms settled down. Goal is to try and keep him mobile. I am increasing the Imdur today to 60 mg a day. We talked about proceeding with surgery consult - he wants to see how he does over this next month. Ok to use sl NTG as needed.   2. Persistent AF - managed with rate control and anticoagulation  3. HTN - BP is fine - has had recent increase in his ARB by his PCP  4. Obesity - weight is down a few pounds.   5. HLD - on statin with LDL at goal  6. Lung nodule - needs repeat CT 10/2019-not discussed today.  7. Significant OA - this is a limiting factor for him.   8. COVID-19 Education: The signs and symptoms of COVID-19 were discussed with the patient and how to seek care for testing (follow  up with PCP or arrange E-visit).  The importance of social distancing, staying at home, hand hygiene and wearing a mask when out in public were discussed today.  Current medicines are reviewed with the patient today.  The patient does not have concerns regarding medicines other than what has been noted above.  The  following changes have been made:  See above.  Labs/ tests ordered today include:   No orders of the defined types were placed in this encounter.    Disposition:   FU with me in one month. I would like for his wife to come - I can get a better sense of how Trellis is doing with her added input.    Patient is agreeable to this plan and will call if any problems develop in the interim.   SignedTruitt Merle, NP  04/22/2019 12:03 PM  [facility] [address] [place], [place]  [address] Phone: [phone] Fax: [phone]

## 2019-04-22 ENCOUNTER — Ambulatory Visit: Payer: Medicare Other | Admitting: Nurse Practitioner

## 2019-04-22 ENCOUNTER — Other Ambulatory Visit: Payer: Self-pay

## 2019-04-22 ENCOUNTER — Encounter: Payer: Self-pay | Admitting: Nurse Practitioner

## 2019-04-22 VITALS — BP 120/78 | HR 58 | Ht 72.0 in | Wt 232.1 lb

## 2019-04-22 DIAGNOSIS — I482 Chronic atrial fibrillation, unspecified: Secondary | ICD-10-CM

## 2019-04-22 DIAGNOSIS — I10 Essential (primary) hypertension: Secondary | ICD-10-CM

## 2019-04-22 DIAGNOSIS — R0789 Other chest pain: Secondary | ICD-10-CM | POA: Diagnosis not present

## 2019-04-22 DIAGNOSIS — I259 Chronic ischemic heart disease, unspecified: Secondary | ICD-10-CM

## 2019-04-22 DIAGNOSIS — E785 Hyperlipidemia, unspecified: Secondary | ICD-10-CM

## 2019-04-22 DIAGNOSIS — Z9889 Other specified postprocedural states: Secondary | ICD-10-CM | POA: Diagnosis not present

## 2019-04-22 DIAGNOSIS — Z7189 Other specified counseling: Secondary | ICD-10-CM

## 2019-04-22 MED ORDER — ISOSORBIDE MONONITRATE ER 60 MG PO TB24: 60.0000 mg | ORAL_TABLET | Freq: Every day | ORAL | 3 refills | Status: DC

## 2019-04-22 NOTE — Patient Instructions (Addendum)
After Visit Summary:  We will be checking the following labs today - NONE   Medication Instructions:    Continue with your current medicines. BUT  I am increasing the Imdur to 60 mg a day - you can take 2 of the 30 mg tablets and use those up - the RX for the 60 mg to your pharmacy.     If you need a refill on your cardiac medications before your next appointment, please call your pharmacy.     Testing/Procedures To Be Arranged:  N/A  Follow-Up:   See me in one month    At [facility] you and your health needs are our priority.  As part of our continuing mission to provide you with exceptional heart care, we have created designated Provider Care Teams.  These Care Teams include your primary Cardiologist (physician) and Advanced Practice Providers (APPs -  Physician Assistants and Nurse Practitioners) who all work together to provide you with the care you need, when you need it.  Special Instructions:  . Stay safe, stay home, wash your hands for at least 20 seconds and wear a mask when out in public.  . It was good to talk with you today.    Call the [place] office at [phone] if you have any questions, problems or concerns.

## 2019-04-24 ENCOUNTER — Other Ambulatory Visit: Payer: Self-pay | Admitting: Nurse Practitioner

## 2019-04-24 ENCOUNTER — Telehealth: Payer: Self-pay | Admitting: Nurse Practitioner

## 2019-04-24 DIAGNOSIS — Z9889 Other specified postprocedural states: Secondary | ICD-10-CM

## 2019-04-24 DIAGNOSIS — I259 Chronic ischemic heart disease, unspecified: Secondary | ICD-10-CM

## 2019-04-24 NOTE — Telephone Encounter (Signed)
Would still use the increase dose of Imdur.   I have called [facility] and asked for consult with EBG to consider option for CABG.   I will tentatively see back as planned.   Burtis Junes, RN, [facility] [address] [place], [place]  [address] [phone]

## 2019-04-24 NOTE — Telephone Encounter (Signed)
Can you call and ask him about this.   I would still want to increase the Imdur as we recommended at his visit.   [name]

## 2019-04-24 NOTE — Telephone Encounter (Signed)
S/w pt and pt's wife, pt started the increase of imdur yesterday.  Pt was sitting with chest pressure.  Pt took two nitro yesterday with relief at different times. Will send to [name] to advise.

## 2019-04-24 NOTE — Telephone Encounter (Signed)
New Message   Pt is calling and wants to let Truitt Merle know that he is leaning toward the surgery or if she wants him to take the medication for a couple of weeks before making a decision.  Please call to discuss

## 2019-04-30 ENCOUNTER — Other Ambulatory Visit: Payer: Self-pay | Admitting: Cardiothoracic Surgery

## 2019-04-30 ENCOUNTER — Other Ambulatory Visit: Payer: Self-pay

## 2019-04-30 ENCOUNTER — Institutional Professional Consult (permissible substitution): Payer: Medicare Other | Admitting: Cardiothoracic Surgery

## 2019-04-30 ENCOUNTER — Encounter: Payer: Self-pay | Admitting: Cardiothoracic Surgery

## 2019-04-30 VITALS — Temp 97.7°F | Resp 16 | Ht 72.0 in | Wt 227.0 lb

## 2019-04-30 DIAGNOSIS — I482 Chronic atrial fibrillation, unspecified: Secondary | ICD-10-CM | POA: Diagnosis not present

## 2019-04-30 DIAGNOSIS — I2510 Atherosclerotic heart disease of native coronary artery without angina pectoris: Secondary | ICD-10-CM | POA: Diagnosis not present

## 2019-04-30 NOTE — Patient Instructions (Signed)
Coronary Artery Bypass Grafting  Coronary artery bypass grafting (CABG) is a surgery to bypassor to fix arteries of the heart (coronary arteries) that are narrow or blocked. This narrowing is usually the result of a buildup of fatty deposits (plaques) in the walls of the vessels. The coronary arteries supply the heart with the oxygen and nutrients that it needs to pump blood through your body. In this surgery, a section of blood vessel from another part of the body (usually the chest, arm, or leg) is removed and then placed where it will allow blood to flow around the damaged part of the coronary artery. The new section of blood vessel is called the graft. Tell a health care provider about:  Any allergies you have.  All medicines you are taking or using, including steroids, blood thinners, vitamins, herbs, eye drops, creams, and over-the-counter medicines.  Any problems you or family members have had with anesthetic medicines.  Any blood disorders you have.  Any surgeries you have had.  Any medical conditions you have.  Whether you are pregnant or may be pregnant. What are the risks? Generally, this is a safe procedure. However, problems may occur, including:  Bleeding, which may require transfusions.  Infection.  Allergic reactions to medicines or dyes.  Pain at the surgical site.  Damage to other structures or organs.  Short-term memory loss, confusion, and personality changes.  Heart rhythm problems (arrhythmias).  Stroke.  Heart attack during or after surgery.  Kidney failure. What happens before the procedure? Staying hydrated Follow instructions from your health care provider about hydration, which may include:  Up to 2 hours before the procedure - you may continue to drink clear liquids, such as water, clear fruit juice, black coffee, and plain tea.  Eating and drinking Follow instructions from your health care provider about eating and drinking, which may  include:  8 hours before the procedure - stop eating heavy meals or foods, such as meat, fried foods, or fatty foods.  6 hours before the procedure - stop eating light meals or foods, such as toast or cereal.  6 hours before the procedure - stop drinking milk or drinks that contain milk.  2 hours before the procedure - stop drinking clear liquids. Medicines  Take over-the-counter and prescription medicines only as told by your health care provider.  Ask your health care provider about: ? Changing or stopping your regular medicines. This is especially important if you are taking diabetes medicines or blood thinners. You may be asked to start new medicines and stop taking others. Do not stop medicines or adjust dosages on your own. ? Taking medicines such as aspirin and ibuprofen. These medicines can thin your blood. Do not take these medicines unless your health care provider tells you to take them. ? Taking over-the-counter medicines, vitamins, herbs, and supplements. General instructions  Ask your health care provider: ? How your surgical site will be marked or identified. ? What steps will be taken to help prevent infection. These may include:  Removing hair at the surgery site.  Washing skin with a germ-killing soap.  Taking antibiotic medicine.  You may be asked to shower with a germ-killing soap.  For 3-6 weeks before the procedure, do not use any products that contain nicotine or tobacco, such as cigarettes, e-cigarettes, and chewing tobacco. Quitting smoking is one of the best things you can do for your heart health. If you need help quitting, ask your health care provider.  Talk with your health   care provider about where the grafts will be taken from for your surgery. What happens during the procedure?  An IV will be inserted into one of your veins.  You will be given one or more of the following: ? A medicine to help you relax (sedative). ? A medicine to make you fall  asleep (general anesthetic).  An incision will be made down the front of the chest through the breastbone (sternum). The sternum will be opened so the surgeon can see the heart.  You may or may not be placed on a heart-lung bypass machine. If this machine is used, your heart will be temporarily stopped. The machine will provide oxygen to your blood while the surgeon works on your heart. If the machine is not used, it is called beating heart bypass surgery.  A section of blood vessel will be removed from another part of your body (usually the chest, arm, or leg).  The blood vessel will be attached above and below the blocked artery of your heart. This may be done on more than one artery of the heart.  When the bypass is done, you will be taken off the heart-lung machine if it was used.  If your heart was stopped, it will be restarted and will take over again normally.  Your chest will be closed with special surgical wire to hold your bones together for healing.  Your incision(s) will be closed with stitches (sutures), skin glue, or adhesive strips.  Bandages (dressings) will be placed over the incision(s).  Tubes will remain in your chest and will be connected to a suction device to help drain fluid and reinflate the lungs. The procedure may vary among health care providers and hospitals. What happens after the procedure?  Your blood pressure, heart rate, breathing rate, and blood oxygen level will be monitored until you leave the hospital.  You may wake up with a tube in your throat to help your breathing. You may be connected to a breathing machine. You will not be able to talk while the tube is in place. The tube will be taken out as soon as it is safe.  You will be groggy and may have some pain. You will be given pain medicine to help control the pain.  You may be in the intensive care unit for 1-2 days.  You may be given oxygen to help you breathe.  You will be shown how to do  deep breathing exercises.  You may have to wear compression stockings. These stockings help to prevent blood clots and reduce swelling in your legs.  You may be given new medicines to take after your surgery.  Cardiac rehabilitation will be started while you are in the hospital. This may include education and exercises to help you recover from your surgery. Summary  In this procedure, a section of blood vessel from another part of the body (usually the chest, arm, or leg) is removed and then placed where it will allow blood to bypass the narrowed or blocked part of the coronary artery.  For 3-6 weeks before the procedure, do not use any products that contain nicotine or tobacco, such as cigarettes, e-cigarettes, and chewing tobacco. If you need help quitting, ask your health care provider.  You may or may not be placed on a heart-lung bypass machine during the surgery. If used, this machine will provide oxygen to your blood while the surgeon works on your heart.  You may wake up with a tube in   your throat to help your breathing. You may be connected to a breathing machine. The tube will be taken out as soon as it is safe. This information is not intended to replace advice given to you by your health care provider. Make sure you discuss any questions you have with your health care provider. Document Released: 02/08/2005 Document Revised: 01/08/2018 Document Reviewed: 01/08/2018 Elsevier Patient Education  [place].  Coronary Artery Bypass Grafting, Care After This sheet gives you information about how to care for yourself after your procedure. Your health care provider may also give you more specific instructions. If you have problems or questions, contact your health care provider. What can I expect after the procedure? After the procedure, it is common to have:  Nausea.  Lack of appetite.  Constipation.  Weakness and fatigue.  Depression or irritability.  Pain or  discomfort in your incision areas. Follow these instructions at home: Medicines  Take over-the-counter and prescription medicines only as told by your health care provider. Do not stop taking medicines or start any new medicines without approval from your health care provider.  If you were prescribed an antibiotic medicine, take it as told by your health care provider. Do not stop using the antibiotic even if you start to feel better. Incision care   Follow instructions from your health care provider about how to take care of your incisions. Make sure you: ? Wash your hands with soap and water before and after you change your bandage (dressing). If soap and water are not available, use hand sanitizer. ? Change your dressing as told by your health care provider. ? Leave stitches (sutures), skin glue, or adhesive strips in place. These skin closures may need to stay in place for 2 weeks or longer. If adhesive strip edges start to loosen and curl up, you may trim the loose edges. Do not remove adhesive strips completely unless your health care provider tells you to do that.  Keep incision areas clean, dry, and protected.  Check your incision areas every day for signs of infection. Check for: ? More redness, swelling, or pain. ? More fluid or blood. ? Warmth. ? Pus or a bad smell.  If incisions were made in your legs: ? Avoid crossing your legs. ? Avoid sitting for long periods of time. Change positions every 30 minutes. ? Raise (elevate) your legs when you are sitting. Bathing  Do not take baths, swim, or use a hot tub until your health care provider approves.  Only take sponge baths. Pat the incisions dry. Do not rub incisions with a washcloth or towel.  Ask your health care provider when you can shower. Eating and drinking   Eat foods that are high in fiber, such as beans, nuts, whole grains, and raw fruits and vegetables. Meats, if eaten, should be lean cut. Avoid canned,  processed, and fried foods. This can help prevent constipation and is a recommended part of a heart-healthy diet.  Drink enough fluid to keep your urine pale yellow.  Do not drink alcohol until your recovery is complete. Ask your health care provider when it is safe to drink alcohol. Activity  Rest and limit your activity as told by your health care provider. You may be instructed to: ? Stop any activity right away if you have chest pain, shortness of breath, irregular heartbeats, or dizziness. Get help right away if you have any of these symptoms. ? Move around frequently for short periods or take short walks  as told by your health care provider. Gradually increase your activities. ? Avoid lifting, pushing, or pulling anything that is heavier than 10 lb (4.5 kg) for at least 6 weeks or as told by your health care provider.  Participate in physical therapy or a cardiac rehabilitation program as told by your health care provider. ? Physical therapy involves doing exercises to maintain movement, strengthen your muscles, and build your endurance. ? A cardiac rehabilitation program is a treatment program to improve your health and well-being through exercise training, education, and counseling.  Do not drive until your health care provider approves.  Ask your health care provider when you may return to work.  Ask your health care provider when you may resume sexual activity. General instructions  Do not drive or use heavy machinery while taking prescription pain medicine.  Do not use any products that contain nicotine or tobacco, such as cigarettes, e-cigarettes, and chewing tobacco. If you need help quitting, ask your health care provider.  Take 2-3 deep breaths every few hours during the day, while you recover. This helps expand your lungs and prevent complications like pneumonia after surgery.  If you were given a device called an incentive spirometer, use it several times a day to  practice deep breathing. Support your chest with a pillow or your arms when you take deep breaths or cough.  Wear compression stockings as told by your health care provider. These stockings help to prevent blood clots and reduce swelling in your legs.  Weigh yourself every day. This helps identify if your body is holding (retaining) fluid that may make your heart and lungs work harder.  Keep all follow-up visits as told by your health care provider. This is important. Contact a health care provider if:  You have more redness, swelling, or pain around any incision.  You have more fluid or blood coming from any incision.  Any incision feels warm to the touch.  You have pus or a bad smell coming from any incision.  You have a fever.  You have swelling in your ankles or legs.  You have pain in your legs.  You gain 2 lb (0.9 kg) or more a day.  You are nauseous or you vomit.  You have diarrhea. Get help right away if:  You have chest pain that spreads to your jaw or arms.  You are short of breath.  You have a fast or irregular heartbeat.  You notice a "clicking" in your breastbone (sternum) when you move.  You have any symptoms of a stroke. "BE FAST" is an easy way to remember the main warning signs of a stroke: ? B - Balance. Signs are dizziness, sudden trouble walking, or loss of balance. ? E - Eyes. Signs are trouble seeing or a sudden change in vision. ? F - Face. Signs are sudden weakness or numbness of the face, or the face or eyelid drooping on one side. ? A - Arms. Signs are weakness or numbness in an arm. This happens suddenly and usually on one side of the body. ? S - Speech. Signs are sudden trouble speaking, slurred speech, or trouble understanding what people say. ? T - Time. Time to call emergency services. Write down what time symptoms started.  You have other signs of a stroke, such as: ? A sudden, severe headache with no known cause. ? Nausea or  vomiting. ? Seizure. These symptoms may represent a serious problem that is an emergency. Do not wait to  see if the symptoms will go away. Get medical help right away. Call your local emergency services (911 in the [place] Do not drive yourself to the hospital. Summary  After the procedure, it is common to have pain or discomfort in the incision areas.  Do not take baths, swim, or use a hot tub until your health care provider approves.  Gradually increase your activities. You may need physical therapy or cardiac rehabilitation to help strengthen your muscles and build your endurance.  Weigh yourself every day. This helps identify if your body is holding (retaining) fluid that may make your heart and lungs work harder. This information is not intended to replace advice given to you by your health care provider. Make sure you discuss any questions you have with your health care provider. Document Released: 11/18/2004 Document Revised: 01/08/2018 Document Reviewed: 01/08/2018 Elsevier Patient Education  2020 [employer].

## 2019-04-30 NOTE — Progress Notes (Signed)
[address]       [place] [address]             [phone]                    Shriyan A Creek White Medical Record [id number] Date of Birth: Dec 18, 1945  Referring: Burtis Junes, NP Primary Care: Leanna Battles, MD Primary Cardiologist: No primary care provider on file.  Chief Complaint:    Chief Complaint  Patient presents with  . Coronary Artery Disease    eval for surgery...CATH 03/14/19, CT CORON. 03/03/19    History of Present Illness:    [name] 73 y.o. male is seen in the office  today for evalution for CABG. He underwent cardiac cath 02/2019 for worsening DOE and chest tightness. He has a history of persistent atrial fibrillation, on chronic anticoagulation with Eliquis, OSA on CPAP, HTN, HLD, obesity, chronic back pain/OA and chronic right middle lung nodule followed by CT.     Current Activity/ Functional Status:  Patient is independent with mobility/ambulation, transfers, ADL's, IADL's.   Zubrod Score: At the time of surgery this patient's most appropriate activity status/level should be described as: []     0    Normal activity, no symptoms [x]     1    Restricted in physical strenuous activity but ambulatory, able to do out light work []     2    Ambulatory and capable of self care, unable to do work activities, up and about               >50 % of waking hours                              []     3    Only limited self care, in bed greater than 50% of waking hours []     4    Completely disabled, no self care, confined to bed or chair []     5    Moribund   Past Medical History:  Diagnosis Date  . A-fib ([place])   . Arrhythmia    hx. Atrial Fib. ,"with regurgitation"  . Depression   . Diabetes mellitus   . Dysrhythmia    a. fib-on Eliquis  . Heart murmur   . Hx of echocardiogram    Echo 3/16:  Mild LVH, EF 60-65%, mild MR, severe LAE, mild RAE, PASP 32 mmHg  . Hypercholesteremia   . Hypertension   . Macular degeneration  of both eyes   . OSA on CPAP   . Osteoarthritis (arthritis due to wear and tear of joints)   . Palpitations   . Prostate cancer ([place])    seed implantion- 15 to 20 yrs ago  . Sleep apnea    uses CPAP-settings 5  . Ureter, stricture    hx. 2 yrs ago-stent has been removed now    Past Surgical History:  Procedure Laterality Date  . COLONOSCOPY  2007  . CYSTOSCOPY W/ RETROGRADES  04/04/2012   Procedure: CYSTOSCOPY WITH RETROGRADE PYELOGRAM;  Surgeon: Molli Hazard, MD;  Location: [facility];  Service: Urology;  Laterality: Bilateral;  . CYSTOSCOPY WITH RETROGRADE PYELOGRAM, URETEROSCOPY AND STENT PLACEMENT  04/04/2012   Procedure: CYSTOSCOPY WITH RETROGRADE PYELOGRAM, URETEROSCOPY AND STENT PLACEMENT;  Surgeon: Molli Hazard, MD;  Location: [facility];  Service: Urology;  Laterality: Right;  . JOINT REPLACEMENT  Left   . LEFT HEART CATH AND CORONARY ANGIOGRAPHY N/A 03/14/2019   Procedure: LEFT HEART CATH AND CORONARY ANGIOGRAPHY;  Surgeon: Belva Crome, MD;  Location: [facility];  Service: Cardiovascular;  Laterality: N/A;  . LUMBAR LAMINECTOMY/DECOMPRESSION MICRODISCECTOMY N/A 09/27/2017   Procedure: LAMINECTOMY LUMBAR THREE- LUMBAR FOUR, LUMBAR FOUR- LUMBAR FIVE;  Surgeon: Ashok Pall, MD;  Location: [facility];  Service: Neurosurgery;  Laterality: N/A;  . ROTATOR CUFF REPAIR Left   . seed implants    . TOTAL KNEE ARTHROPLASTY Left 08/06/2014   Procedure: LEFT TOTAL KNEE ARTHROPLASTY;  Surgeon: Susa Day, MD;  Location: [facility];  Service: Orthopedics;  Laterality: Left;    Family History  Problem Relation Age of Onset  . Cancer Mother   . Dementia Mother   . Colon cancer Mother   . Heart attack Sister   . Stroke Sister   . Heart attack Father   . Esophageal cancer Neg Hx   . Rectal cancer Neg Hx   . Stomach cancer Neg Hx      Social History   Tobacco Use  Smoking Status Former Smoker  . Quit date: 05/15/1973  . Years since quitting: 46.0  Smokeless Tobacco  Never Used    Social History   Substance and Sexual Activity  Alcohol Use Yes   Comment: weekly beer or cocktail     Allergies  Allergen Reactions  . Lisinopril Cough  . Sulfa Drugs Cross Reactors     CHILDHOOD UNSPECIFIED REACTION   . Contrast Media [Iodinated Diagnostic Agents] Rash  . Doxycycline Itching  . Ioxaglate Rash  . Tetracyclines & Related Rash    Current Outpatient Medications  Medication Sig Dispense Refill  . allopurinol (ZYLOPRIM) 300 MG tablet Take 300 mg by mouth daily.    Marland Kitchen atenolol-chlorthalidone (TENORETIC) 50-25 MG per tablet Take 0.5 tablets by mouth daily.     Marland Kitchen atorvastatin (LIPITOR) 10 MG tablet Take 10 mg by mouth at bedtime.     . colchicine 0.6 MG tablet Take 1 tablet by mouth as needed (gout).     Marland Kitchen ELIQUIS 5 MG TABS tablet TAKE 1 TABLET BY MOUTH TWO  TIMES DAILY 180 tablet 2  . isosorbide mononitrate (IMDUR) 60 MG 24 hr tablet Take 1 tablet (60 mg total) by mouth daily. 90 tablet 3  . losartan (COZAAR) 100 MG tablet Take 100 mg by mouth daily.    . metFORMIN (GLUCOPHAGE) 1000 MG tablet Take 500 mg by mouth daily with breakfast.    . Multiple Vitamin (MULTIVITAMIN WITH MINERALS) TABS tablet Take 1 tablet by mouth daily. Centrum Silver    . Multiple Vitamins-Minerals (PRESERVISION AREDS 2) CAPS Take 1 capsule by mouth 2 (two) times daily.     . nitroGLYCERIN (NITROSTAT) 0.4 MG SL tablet Place 1 tablet (0.4 mg total) under the tongue every 5 (five) minutes as needed for chest pain. 25 tablet 3  . traMADol (ULTRAM) 50 MG tablet Take 50 mg by mouth 2 (two) times daily. arthritis      No current facility-administered medications for this visit.      Review of Systems:     Cardiac Review of Systems: [Y] = yes  or   [ N ] = no   Chest Pain Blue.Reese    ]  Resting SOB [ n  ] Exertional SOB  Blue.Reese  ]  Orthopnea [ n ]   Pedal Edema [ n  ]    Palpitations [n  ] Syncope  [  n ]   Presyncope [n   ]   General Review of Systems: [Y] = yes [  ]=no Constitional:  recent weight change [  ];  Wt loss over the last 3 months [   ] anorexia [  ]; fatigue [  ]; nausea [  ]; night sweats [  ]; fever [  ]; or chills [  ];           Eye : blurred vision [  ]; diplopia [   ]; vision changes [  ];  Amaurosis fugax[  ]; Resp: cough [  ];  wheezing[  ];  hemoptysis[  ]; shortness of breath[ y ]; paroxysmal nocturnal dyspnea[  ]; dyspnea on exertion[y  ]; or orthopnea[  ];  GI:  gallstones[  ], vomiting[  ];  dysphagia[  ]; melena[  ];  hematochezia [  ]; heartburn[  ];   Hx of  Colonoscopy[  ]; GU: kidney stones [  ]; hematuria[  ];   dysuria [  ];  nocturia[  ];  history of     obstruction [  ]; urinary frequency [  ]             Skin: rash, swelling[  ];, hair loss[  ];  peripheral edema[  ];  or itching[  ]; Musculosketetal: myalgias[  ];  joint swelling[  ];  joint erythema[  ];  joint pain[  ];  back pain[  ];  Heme/Lymph: bruising[y  ];  bleeding[  ];  anemia[  ];  Neuro: TIA[  ];  headaches[  ];  stroke[  ];  vertigo[  ];  seizures[  ];   paresthesias[  ];  difficulty walking[  ];  Psych:depression[  ]; anxiety[  ];  Endocrine: diabetes[  ];  thyroid dysfunction[  ];  Immunizations: Flu up to date [ y ]; Pneumococcal up to date Blue.Reese  ];  Other:     PHYSICAL EXAMINATION: Temp 97.7 F (36.5 C)   Resp 16   Ht 6' (1.829 m)   Wt 103 kg   SpO2 96% Comment: ON RA...USES CPAP  BMI 30.79 kg/m  General appearance: alert, cooperative and no distress Head: Normocephalic, without obvious abnormality, atraumatic Neck: no adenopathy, no carotid bruit, no JVD, supple, symmetrical, trachea midline and thyroid not enlarged, symmetric, no tenderness/mass/nodules Lymph nodes: Cervical, supraclavicular, and axillary nodes normal. Resp: clear to auscultation bilaterally Cardio: irregularly irregular rhythm GI: soft, non-tender; bowel sounds normal; no masses,  no organomegaly Extremities: extremities normal, atraumatic, no cyanosis or edema Neurologic: Grossly  normal  Diagnostic Studies & Laboratory data:     Recent Radiology Findings:   ECHOCARDIOGRAM COMPLETE  Result Date: 05/02/2019   ECHOCARDIOGRAM REPORT   Patient Name:   WONG W[facility]SON [facility] Date of Exam: 05/02/2019 Medical Rec #:  [id number]    Height:       72.0 in Accession #:    [id number]   Weight:       227.0 lb Date of Birth:  04-03-1946    BSA:          2.25 m Patient Age:    73 years     BP:           114/74 mmHg Patient Gender: M            HR:           93 bpm. Exam Location:  Outpatient Procedure: 2D Echo, Cardiac Doppler and Color Doppler Indications:  CAD Native Vessel 414.01  History:        Patient has prior history of Echocardiogram examinations, most                 recent 07/24/2014. Arrythmias:Atrial Fibrillation,                 Signs/Symptoms:Chest Pain and DOE; Risk Factors:Dyslipidemia and                 Former Smoker.  Sonographer:    Paulita Fujita RDCS Referring Phys: [phone] Kinzleigh Kandler B Nolyn Eilert IMPRESSIONS  1. Left ventricular ejection fraction, by visual estimation, is 50 to 55%. The left ventricle has low normal function. Left ventricular septal wall thickness was mildly increased. Mildly increased left ventricular posterior wall thickness. There is mildly increased left ventricular hypertrophy.  2. Left ventricular diastolic function could not be evaluated.  3. The left ventricle has no regional wall motion abnormalities.  4. Global right ventricle has normal systolic function.The right ventricular size is normal. No increase in right ventricular wall thickness.  5. Left atrial size was severely dilated.  6. Right atrial size was severely dilated.  7. The mitral valve is normal in structure. Mild mitral valve regurgitation. No evidence of mitral stenosis.  8. The tricuspid valve is normal in structure. Tricuspid valve regurgitation is mild.  9. The aortic valve is tricuspid. Aortic valve regurgitation is trivial. No evidence of aortic valve sclerosis or stenosis. 10. Pulmonic  regurgitation is mild. 11. The pulmonic valve was normal in structure. Pulmonic valve regurgitation is mild. 12. Normal pulmonary artery systolic pressure. 13. The inferior vena cava is normal in size with greater than 50% respiratory variability, suggesting right atrial pressure of 3 mmHg. FINDINGS  Left Ventricle: Left ventricular ejection fraction, by visual estimation, is 50 to 55%. The left ventricle has low normal function. The left ventricle has no regional wall motion abnormalities. The left ventricular internal cavity size was the left ventricle is normal in size. Mildly increased left ventricular posterior wall thickness. There is mildly increased left ventricular hypertrophy. The left ventricular diastology could not be evaluated due to atrial fibrillation. Left ventricular diastolic  function could not be evaluated. Normal left atrial pressure. Right Ventricle: The right ventricular size is normal. No increase in right ventricular wall thickness. Global RV systolic function is has normal systolic function. The tricuspid regurgitant velocity is 2.59 m/s, and with an assumed right atrial pressure  of 3 mmHg, the estimated right ventricular systolic pressure is normal at 29.8 mmHg. Left Atrium: Left atrial size was severely dilated. Right Atrium: Right atrial size was severely dilated Pericardium: There is no evidence of pericardial effusion. Mitral Valve: The mitral valve is normal in structure. Mild mitral valve regurgitation. No evidence of mitral valve stenosis by observation. Tricuspid Valve: The tricuspid valve is normal in structure. Tricuspid valve regurgitation is mild. Aortic Valve: The aortic valve is tricuspid. . There is mild thickening and mild calcification of the aortic valve. Aortic valve regurgitation is trivial. The aortic valve is structurally normal, with no evidence of sclerosis or stenosis. There is mild thickening of the aortic valve. There is mild calcification of the aortic valve.  Pulmonic Valve: The pulmonic valve was normal in structure. Pulmonic valve regurgitation is mild. Pulmonic regurgitation is mild. Aorta: The aortic root, ascending aorta and aortic arch are all structurally normal, with no evidence of dilitation or obstruction. Venous: The inferior vena cava is normal in size with greater than 50% respiratory variability, suggesting  right atrial pressure of 3 mmHg. IAS/Shunts: No atrial level shunt detected by color flow Doppler. There is no evidence of a patent foramen ovale. No ventricular septal defect is seen or detected. There is no evidence of an atrial septal defect.  LEFT VENTRICLE PLAX 2D LVIDd:         4.30 cm LVIDs:         3.40 cm LV PW:         1.10 cm LV IVS:        1.10 cm LVOT diam:     2.10 cm LV SV:         36 ml LV SV Index:   15.41 LVOT Area:     3.46 cm  RIGHT VENTRICLE RV Basal diam:  3.08 cm RV S prime:     1070.00 cm/s TAPSE (M-mode): 1.5 cm LEFT ATRIUM            Index LA diam:      5.50 cm  2.45 cm/m LA Vol (A2C): 128.0 ml 56.93 ml/m LA Vol (A4C): 115.0 ml 51.15 ml/m  AORTIC VALVE LVOT Vmax:   73.10 cm/s LVOT Vmean:  52.800 cm/s LVOT VTI:    0.155 m MR Peak grad: 65.0 mmHg   TRICUSPID VALVE MR Vmax:      403.00 cm/s TR Peak grad:   26.8 mmHg                           TR Vmax:        259.00 cm/s                            SHUNTS                           Systemic VTI:  0.16 m                           Systemic Diam: 2.10 cm  Skeet Latch MD Electronically signed by Skeet Latch MD Signature Date/Time: 05/02/2019/4:18:03 PM    Final    Left Atrium 5.5 cm      Recent Lab Findings: Lab Results  Component Value Date   WBC 6.3 03/10/2019   HGB 15.7 03/10/2019   HCT 45.0 03/10/2019   PLT 136 (L) 03/10/2019   GLUCOSE 93 04/02/2019   CHOL 137 10/28/2018   TRIG 215 (H) 10/28/2018   HDL 33 (L) 10/28/2018   LDLCALC 61 10/28/2018   ALT 30 04/02/2019   AST 32 04/02/2019   NA 141 04/02/2019   K 3.9 04/02/2019   CL 102 04/02/2019    CREATININE 1.35 (H) 04/02/2019   BUN 26 04/02/2019   CO2 25 04/02/2019   INR 1.13 09/27/2017   HGBA1C 5.1 09/18/2017   EXAM: CT FFR ANALYSIS  CLINICAL DATA:  73 year old male with chest pain and abnormal coronary CTA.  FINDINGS: FFRct analysis was performed on the original cardiac CT angiogram dataset. Diagrammatic representation of the FFRct analysis is provided in a separate PDF document in PACS. This dictation was created using the PDF document and an interactive 3D model of the results. 3D model is not available in the EMR/PACS. Normal FFR range is >0.80.  1. Left Main: 0.98.  2. Proximal LAD: 0.86, mid LAD: 0.78, distal LAD: 0.74. 3. LCX: 0.55, however very small lumen. 4. RCA:  Proximal: 0.97, mid: 0.84, distal: 0.83. 5. PDA: Occluded in the proximal portion. 6. PLA: 0.5.  IMPRESSION: 1. CT FFR analysis showed severe stenosis in the mid LAD, small PLA and occluded proximal portion of PDA. A cardiac catheterization is recommended. 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   Electronically Signed   By: Ena Dawley   On: 03/04/2019 19:08  ADDENDUM REPORT: 03/03/2019 12:54  CLINICAL DATA:  73 year old male with h/o DM, hypertension, HLP and chest pressure.  EXAM: Cardiac/Coronary  CTA  TECHNIQUE: The patient was scanned on a [employer].  FINDINGS: A 100 kV prospective scan was triggered in the descending thoracic aorta at 111 HU's. Axial non-contrast 3 mm slices were carried out through the heart. The data set was analyzed on a dedicated work station and scored using the [place]. Gantry rotation speed was 250 msecs and collimation was .6 mm. No beta blockade and 0.8 mg of sl NTG was given. The 3D data set was reconstructed in 5% intervals of the 67-82 % of the R-R cycle. Diastolic phases were analyzed on a dedicated work station using MPR, MIP and VRT modes. The patient received 80 cc  of contrast.  Aorta: Normal size. Mild atherosclerotic plaque and calcifications. No dissection.  Aortic Valve: Trileaflet. Mild leaflet thickening and calcifications.  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 Right dominance.  RCA is a large dominant artery that gives rise to a small non-dominant anomalous LCX artery with posterior course, PDA and PLA. There is minimal calcified plaque in the proximal portion with stenosis 0-24%. There is severe non-calcified plaque in the mid RCA with stenosis > 70%. Distal RCA/PDA/PLA is affected by motion and poor vasodilation, however there appears to be at least moderate diffuse calcified plaque and stenosis 50-69%.  LAD is a large vessel that originated in between left and non-coronary sinus. It gives rise to one large diagonal artery. There is moderate non-calcified plaque in the proximal portion with stenosis 50-69%. This is followed by a diffuse moderate predominantly calcified plaque with stenosis at least 50-69% in the mid portion. Mid to distal LAD has mild diffuse plaque with stenosis 25-49%.  D1 is a medium size artery with mild diffuse plaque.  LCX is a very small non-dominant artery that originates in the proximal RCA, runs posteriorly between aorta and left atrium and gives rise to a very small OM branch. There is at least moderate plaque, however vessel lumen too small for intervention.  Other findings:  Normal pulmonary vein drainage into the left atrium.  IMPRESSION: 1. Coronary calcium score of 1242. This was 59 percentile for age and sex matched control.  2. 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 Right dominance.  3. Study quality is affected by poor vasodilation, diffuse calcifications and motion associated with atrial fibrillation. However, there is severe stenosis in the mid RCA and  possibly in the distal RCA and mid LAD. Additional analysis with CT FFR will be submitted. CAD-RADS 4.  4. Mildly dilated pulmonary artery measuring 31 mm.  5. There is a filling defect in the most antero-superior portion of the left atrial appendage, this might represent a poor contrast mixing on first pass imaging vs a thrombus.   Electronically Signed   By: Ena Dawley   On: 03/03/2019 12:54   Addended by Dorothy Spark, MD on 03/03/2019 12:57 PM    Study  Result  EXAM: OVER-READ INTERPRETATION  CT CHEST  The following report is an over-read performed by radiologist Dr. Rolm Baptise of [facility], [place] on 03/03/2019. This over-read does not include interpretation of cardiac or coronary anatomy or pathology. The coronary CTA interpretation by the cardiologist is attached.  COMPARISON:  11/07/2017  FINDINGS: Vascular: Heart is normal size. Visualized aorta normal caliber. Scattered calcifications in the visualized descending thoracic aorta.  Mediastinum/Nodes: Partially imaged on the 1st image is a mildly prominent prevascular lymph node with a short axis diameter of 12 mm. This is stable since prior study. No additional prominent visualized mediastinal or hilar lymph nodes.  Lungs/Pleura: No confluent opacity or effusion.  Upper Abdomen: Suspect fatty infiltration of the liver. No acute findings.  Musculoskeletal: Chest wall soft tissues are unremarkable. No acute bony abnormality.  IMPRESSION: No acute extra cardiac abnormality.  Borderline sized prevascular lymph node, stable since prior study.  Fatty infiltration of the liver.  Electronically Signed: By: Rolm Baptise M.D. On: 03/03/2019 10:13       CATh 03/14/2019  Moderately severe three-vessel coronary disease.  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  2 small obtuse marginal branch is supplied on the left  lateral wall.  40% proximal LAD with 60 to 70% diffuse mid to distal LAD.  First diagonal contains eccentric 50 to 70% narrowing.  Right coronary is diffusely diseased with mid 60 to 65% stenosis.  Distal 75% stenosis before small PDA.  99% stenosis in the continuation of the right coronary before the second PLA.  The acute marginal branch of the right coronary has multifocal 95% stenoses.  Low normal LVEF approximately 50% with normal EDP.  AO Systolic Pressure Q000111Q mmHg  AO Diastolic Pressure 70 mmHg  AO Mean 96 mmHg  LV Systolic Pressure 0000000 mmHg  LV Diastolic Pressure 3 mmHg  LV EDP 7 mmHg  AOp Systolic Pressure Q000111Q mmHg  AOp Diastolic Pressure 70 mmHg  AOp Mean Pressure 96 mmHg  LVp Systolic Pressure [id number] mmHg  LVp Diastolic Pressure 3 mmHg  LVp EDP Pressure 9 mmHg     RECOMMENDATIONS:   Aggressive risk preventive therapy with LDL target less than 55.  Add long-acting nitrate therapy to improve exertional dyspnea and chest tightness.  If unacceptable symptoms, would consider multivessel coronary bypass grafting.  In absence of high-grade proximal CAD, I feel a measured approach attempting to control symptoms with medications is a reasonable first option.  Assessment / Plan:   1/ Coronary Artery disease with anginal symptoms in spite of aggressive medical rx. He notes some improvement symptoms with increase of Imdur to 60 mg day, but still limited by chest discomfort and sob.  Anomalous circumflex coronary noted , distal vessel my not be bypassable.  2/ Chronic atrial fib on elequis  With severely dilated left atrium 5.5 cm may be contributing to sob with exertion After this visit current echo arranged and results reported. Cardiac CT suggested question of thrombosis in laa- but subsequent  Review by Dr Meda Coffee likely first pass mixing.  3/ Followed for ground glass opacity right middle lobe. - not mentioned on recent cardiac ct - will have radiologist review   I reviewed  with the patient and his wife the risks and options of coronary artery bypass grafting.  With his ongoing symptoms in spite of aggressive medical management coronary artery bypass grafting has been recommended to him as a treatment option.  He notes since increasing  his Imdur dosage recently he may feel slightly better.  We will obtain an echocardiogram to evaluate the size of his left atrium, have radiology review his CT scan overview.  Patient will return next week for further discussion and review of his symptoms before making a final decision       I  spent 60 minutes with  the patient face to face and greater then 50% of the time was spent in counseling and coordination of care.    Grace Isaac MD      [address] [place] [address] Office [phone]     05/04/2019 6:26 PM

## 2019-05-02 ENCOUNTER — Ambulatory Visit (HOSPITAL_COMMUNITY)
Admission: RE | Admit: 2019-05-02 | Discharge: 2019-05-02 | Disposition: A | Discharge status: Home / Self Care | Payer: Medicare Other | Source: Ambulatory Visit | Attending: Cardiothoracic Surgery | Admitting: Cardiothoracic Surgery

## 2019-05-02 ENCOUNTER — Other Ambulatory Visit: Payer: Self-pay

## 2019-05-02 DIAGNOSIS — E875 Hyperkalemia: Secondary | ICD-10-CM | POA: Diagnosis not present

## 2019-05-02 DIAGNOSIS — Z87891 Personal history of nicotine dependence: Secondary | ICD-10-CM | POA: Diagnosis not present

## 2019-05-02 DIAGNOSIS — I088 Other rheumatic multiple valve diseases: Secondary | ICD-10-CM | POA: Diagnosis not present

## 2019-05-02 DIAGNOSIS — I2510 Atherosclerotic heart disease of native coronary artery without angina pectoris: Secondary | ICD-10-CM | POA: Diagnosis not present

## 2019-05-02 DIAGNOSIS — I4891 Unspecified atrial fibrillation: Secondary | ICD-10-CM | POA: Insufficient documentation

## 2019-05-02 NOTE — Progress Notes (Signed)
  Echocardiogram 2D Echocardiogram has been performed.  Burnett Kanaris 05/02/2019, 2:35 PM

## 2019-05-06 ENCOUNTER — Other Ambulatory Visit: Payer: Self-pay

## 2019-05-06 ENCOUNTER — Ambulatory Visit: Payer: Medicare Other | Admitting: Cardiothoracic Surgery

## 2019-05-06 ENCOUNTER — Encounter: Payer: Self-pay | Admitting: Cardiothoracic Surgery

## 2019-05-06 VITALS — BP 139/87 | HR 90 | Temp 97.3°F | Resp 20 | Ht 72.0 in | Wt 233.5 lb

## 2019-05-06 DIAGNOSIS — I2510 Atherosclerotic heart disease of native coronary artery without angina pectoris: Secondary | ICD-10-CM | POA: Diagnosis not present

## 2019-05-06 DIAGNOSIS — I482 Chronic atrial fibrillation, unspecified: Secondary | ICD-10-CM

## 2019-05-06 NOTE — Progress Notes (Signed)
[address]       [place] [address]             [phone]                    Brittney A Wille Ripley Medical Record [id number] Date of Birth: 03-31-1946  Referring: Burtis Junes, NP Primary Care: Leanna Battles, MD Primary Cardiologist: No primary care provider on file.  Chief Complaint:    Chief Complaint  Patient presents with  . Coronary Artery Disease    Further discuss CABG, echocardiogram 12/18    History of Present Illness:    [name] 73 y.o. male is seen in the office  today for evalution for CABG. He underwent cardiac cath 02/2019 for worsening DOE and chest tightness. He has a history of persistent atrial fibrillation, on chronic anticoagulation with Eliquis, OSA on CPAP, HTN, HLD, obesity, chronic back pain/OA and chronic right middle lung nodule followed by CT.   Patient returns office today to further discuss symptoms.  He notes continued shortness of breath with exertion, occasional episodes of substernal discomfort in the morning when he first gets up.  He does do some activities around the yard.  He notes that the recent doubling of his isosorbide has helped some but has not eliminated the symptoms of angina, dyspnea on exertion.  Current Activity/ Functional Status:  Patient is independent with mobility/ambulation, transfers, ADL's, IADL's.   Zubrod Score: At the time of surgery this patient's most appropriate activity status/level should be described as: []     0    Normal activity, no symptoms [x]     1    Restricted in physical strenuous activity but ambulatory, able to do out light work []     2    Ambulatory and capable of self care, unable to do work activities, up and about               >50 % of waking hours                              []     3    Only limited self care, in bed greater than 50% of waking hours []     4    Completely disabled, no self care, confined to bed or chair []     5    Moribund   Past Medical  History:  Diagnosis Date  . A-fib ([place])   . Arrhythmia    hx. Atrial Fib. ,"with regurgitation"  . Depression   . Diabetes mellitus   . Dysrhythmia    a. fib-on Eliquis  . Heart murmur   . Hx of echocardiogram    Echo 3/16:  Mild LVH, EF 60-65%, mild MR, severe LAE, mild RAE, PASP 32 mmHg  . Hypercholesteremia   . Hypertension   . Macular degeneration of both eyes   . OSA on CPAP   . Osteoarthritis (arthritis due to wear and tear of joints)   . Palpitations   . Prostate cancer ([place])    seed implantion- 15 to 20 yrs ago  . Sleep apnea    uses CPAP-settings 5  . Ureter, stricture    hx. 2 yrs ago-stent has been removed now    Past Surgical History:  Procedure Laterality Date  . COLONOSCOPY  2007  . CYSTOSCOPY W/ RETROGRADES  04/04/2012   Procedure: CYSTOSCOPY WITH RETROGRADE  [email or web address];  Surgeon: Molli Hazard, MD;  Location: [facility];  Service: Urology;  Laterality: Bilateral;  . CYSTOSCOPY WITH RETROGRADE PYELOGRAM, URETEROSCOPY AND STENT PLACEMENT  04/04/2012   Procedure: CYSTOSCOPY WITH RETROGRADE PYELOGRAM, URETEROSCOPY AND STENT PLACEMENT;  Surgeon: Molli Hazard, MD;  Location: [facility];  Service: Urology;  Laterality: Right;  . JOINT REPLACEMENT Left   . LEFT HEART CATH AND CORONARY ANGIOGRAPHY N/A 03/14/2019   Procedure: LEFT HEART CATH AND CORONARY ANGIOGRAPHY;  Surgeon: Belva Crome, MD;  Location: [name];  Service: Cardiovascular;  Laterality: N/A;  . LUMBAR LAMINECTOMY/DECOMPRESSION MICRODISCECTOMY N/A 09/27/2017   Procedure: LAMINECTOMY LUMBAR THREE- LUMBAR FOUR, LUMBAR FOUR- LUMBAR FIVE;  Surgeon: Ashok Pall, MD;  Location: [facility];  Service: Neurosurgery;  Laterality: N/A;  . ROTATOR CUFF REPAIR Left   . seed implants    . TOTAL KNEE ARTHROPLASTY Left 08/06/2014   Procedure: LEFT TOTAL KNEE ARTHROPLASTY;  Surgeon: Susa Day, MD;  Location: [facility];  Service: Orthopedics;  Laterality: Left;    Family History  Problem Relation Age of  Onset  . Cancer Mother   . Dementia Mother   . Colon cancer Mother   . Heart attack Sister   . Stroke Sister   . Heart attack Father   . Esophageal cancer Neg Hx   . Rectal cancer Neg Hx   . Stomach cancer Neg Hx      Social History   Tobacco Use  Smoking Status Former Smoker  . Quit date: 05/15/1973  . Years since quitting: 46.0  Smokeless Tobacco Never Used    Social History   Substance and Sexual Activity  Alcohol Use Yes   Comment: weekly beer or cocktail     Allergies  Allergen Reactions  . Lisinopril Cough  . Sulfa Drugs Cross Reactors     CHILDHOOD UNSPECIFIED REACTION   . Contrast Media [Iodinated Diagnostic Agents] Rash  . Doxycycline Itching  . Ioxaglate Rash  . Tetracyclines & Related Rash    Current Outpatient Medications  Medication Sig Dispense Refill  . allopurinol (ZYLOPRIM) 300 MG tablet Take 300 mg by mouth daily.    Marland Kitchen atenolol-chlorthalidone (TENORETIC) 50-25 MG per tablet Take 0.5 tablets by mouth daily.     Marland Kitchen atorvastatin (LIPITOR) 10 MG tablet Take 10 mg by mouth at bedtime.     . colchicine 0.6 MG tablet Take 1 tablet by mouth as needed (gout).     Marland Kitchen ELIQUIS 5 MG TABS tablet TAKE 1 TABLET BY MOUTH TWO  TIMES DAILY 180 tablet 2  . isosorbide mononitrate (IMDUR) 60 MG 24 hr tablet Take 1 tablet (60 mg total) by mouth daily. 90 tablet 3  . losartan (COZAAR) 100 MG tablet Take 100 mg by mouth daily.    . metFORMIN (GLUCOPHAGE) 1000 MG tablet Take 500 mg by mouth daily with breakfast.    . Multiple Vitamin (MULTIVITAMIN WITH MINERALS) TABS tablet Take 1 tablet by mouth daily. Centrum Silver    . Multiple Vitamins-Minerals (PRESERVISION AREDS 2) CAPS Take 1 capsule by mouth 2 (two) times daily.     . nitroGLYCERIN (NITROSTAT) 0.4 MG SL tablet Place 1 tablet (0.4 mg total) under the tongue every 5 (five) minutes as needed for chest pain. 25 tablet 3  . traMADol (ULTRAM) 50 MG tablet Take 50 mg by mouth 2 (two) times daily. arthritis      No current  facility-administered medications for this visit.      Review of Systems:  Cardiac Review of Systems: [Y] = yes  or   [ N ] = no   Chest Pain Blue.Reese    ]  Resting SOB [ n  ] Exertional SOB  Blue.Reese  ]  Orthopnea [ n ]   Pedal Edema [ n  ]    Palpitations [n  ] Syncope  [ n ]   Presyncope [n   ]   General Review of Systems: [Y] = yes [  ]=no Constitional: recent weight change [  ];  Wt loss over the last 3 months [   ] anorexia [  ]; fatigue [  ]; nausea [  ]; night sweats [  ]; fever [  ]; or chills [  ];           Eye : blurred vision [  ]; diplopia [   ]; vision changes [  ];  Amaurosis fugax[  ]; Resp: cough [  ];  wheezing[  ];  hemoptysis[  ]; shortness of breath[ y ]; paroxysmal nocturnal dyspnea[  ]; dyspnea on exertion[y  ]; or orthopnea[  ];  GI:  gallstones[  ], vomiting[  ];  dysphagia[  ]; melena[  ];  hematochezia [  ]; heartburn[  ];   Hx of  Colonoscopy[  ]; GU: kidney stones [  ]; hematuria[  ];   dysuria [  ];  nocturia[  ];  history of     obstruction [  ]; urinary frequency [  ]             Skin: rash, swelling[  ];, hair loss[  ];  peripheral edema[  ];  or itching[  ]; Musculosketetal: myalgias[  ];  joint swelling[  ];  joint erythema[  ];  joint pain[  ];  back pain[  ];  Heme/Lymph: bruising[y  ];  bleeding[  ];  anemia[  ];  Neuro: TIA[  ];  headaches[  ];  stroke[  ];  vertigo[  ];  seizures[  ];   paresthesias[  ];  difficulty walking[  ];  Psych:depression[  ]; anxiety[  ];  Endocrine: diabetes[  ];  thyroid dysfunction[  ];  Immunizations: Flu up to date [ y ]; Pneumococcal up to date Blue.Reese  ];  Other:     PHYSICAL EXAMINATION: BP 139/87 (BP Location: Right Arm, Patient Position: Sitting, Cuff Size: Normal)   Pulse 90   Temp (!) 97.3 F (36.3 C) (Skin)   Resp 20   Ht 6' (1.829 m)   Wt 105.9 kg   SpO2 95% Comment: RA  BMI 31.67 kg/m  General appearance: alert, cooperative and no distress Head: Normocephalic, without obvious abnormality, atraumatic Neck: no  adenopathy, no carotid bruit, no JVD, supple, symmetrical, trachea midline and thyroid not enlarged, symmetric, no tenderness/mass/nodules Lymph nodes: Cervical, supraclavicular, and axillary nodes normal. Resp: clear to auscultation bilaterally Cardio: irregularly irregular rhythm GI: soft, non-tender; bowel sounds normal; no masses,  no organomegaly Extremities: extremities normal, atraumatic, no cyanosis or edema Neurologic: Grossly normal  Diagnostic Studies & Laboratory data:     Recent Radiology Findings:   ECHOCARDIOGRAM COMPLETE  Result Date: 05/02/2019   ECHOCARDIOGRAM REPORT   Patient Name:   [name] Date of Exam: 05/02/2019 Medical Rec #:  [id number]    Height:       72.0 in Accession #:    [id number]   Weight:       227.0 lb Date of Birth:  August 10, 1945  BSA:          2.25 m Patient Age:    14 years     BP:           114/74 mmHg Patient Gender: M            HR:           93 bpm. Exam Location:  Outpatient Procedure: 2D Echo, Cardiac Doppler and Color Doppler Indications:    CAD Native Vessel 414.01  History:        Patient has prior history of Echocardiogram examinations, most                 recent 07/24/2014. Arrythmias:Atrial Fibrillation,                 Signs/Symptoms:Chest Pain and DOE; Risk Factors:Dyslipidemia and                 Former Smoker.  Sonographer:    Paulita Fujita RDCS Referring Phys: [id number] Zaia Carre B Safire Gordin IMPRESSIONS  1. Left ventricular ejection fraction, by visual estimation, is 50 to 55%. The left ventricle has low normal function. Left ventricular septal wall thickness was mildly increased. Mildly increased left ventricular posterior wall thickness. There is mildly increased left ventricular hypertrophy.  2. Left ventricular diastolic function could not be evaluated.  3. The left ventricle has no regional wall motion abnormalities.  4. Global right ventricle has normal systolic function.The right ventricular size is normal. No increase in right ventricular wall  thickness.  5. Left atrial size was severely dilated.  6. Right atrial size was severely dilated.  7. The mitral valve is normal in structure. Mild mitral valve regurgitation. No evidence of mitral stenosis.  8. The tricuspid valve is normal in structure. Tricuspid valve regurgitation is mild.  9. The aortic valve is tricuspid. Aortic valve regurgitation is trivial. No evidence of aortic valve sclerosis or stenosis. 10. Pulmonic regurgitation is mild. 11. The pulmonic valve was normal in structure. Pulmonic valve regurgitation is mild. 12. Normal pulmonary artery systolic pressure. 13. The inferior vena cava is normal in size with greater than 50% respiratory variability, suggesting right atrial pressure of 3 mmHg. FINDINGS  Left Ventricle: Left ventricular ejection fraction, by visual estimation, is 50 to 55%. The left ventricle has low normal function. The left ventricle has no regional wall motion abnormalities. The left ventricular internal cavity size was the left ventricle is normal in size. Mildly increased left ventricular posterior wall thickness. There is mildly increased left ventricular hypertrophy. The left ventricular diastology could not be evaluated due to atrial fibrillation. Left ventricular diastolic  function could not be evaluated. Normal left atrial pressure. Right Ventricle: The right ventricular size is normal. No increase in right ventricular wall thickness. Global RV systolic function is has normal systolic function. The tricuspid regurgitant velocity is 2.59 m/s, and with an assumed right atrial pressure  of 3 mmHg, the estimated right ventricular systolic pressure is normal at 29.8 mmHg. Left Atrium: Left atrial size was severely dilated. Right Atrium: Right atrial size was severely dilated Pericardium: There is no evidence of pericardial effusion. Mitral Valve: The mitral valve is normal in structure. Mild mitral valve regurgitation. No evidence of mitral valve stenosis by observation.  Tricuspid Valve: The tricuspid valve is normal in structure. Tricuspid valve regurgitation is mild. Aortic Valve: The aortic valve is tricuspid. . There is mild thickening and mild calcification of the aortic valve. Aortic valve regurgitation is trivial. The aortic valve is  structurally normal, with no evidence of sclerosis or stenosis. There is mild thickening of the aortic valve. There is mild calcification of the aortic valve. Pulmonic Valve: The pulmonic valve was normal in structure. Pulmonic valve regurgitation is mild. Pulmonic regurgitation is mild. Aorta: The aortic root, ascending aorta and aortic arch are all structurally normal, with no evidence of dilitation or obstruction. Venous: The inferior vena cava is normal in size with greater than 50% respiratory variability, suggesting right atrial pressure of 3 mmHg. IAS/Shunts: No atrial level shunt detected by color flow Doppler. There is no evidence of a patent foramen ovale. No ventricular septal defect is seen or detected. There is no evidence of an atrial septal defect.  LEFT VENTRICLE PLAX 2D LVIDd:         4.30 cm LVIDs:         3.40 cm LV PW:         1.10 cm LV IVS:        1.10 cm LVOT diam:     2.10 cm LV SV:         36 ml LV SV Index:   15.41 LVOT Area:     3.46 cm  RIGHT VENTRICLE RV Basal diam:  3.08 cm RV S prime:     1070.00 cm/s TAPSE (M-mode): 1.5 cm LEFT ATRIUM            Index LA diam:      5.50 cm  2.45 cm/m LA Vol (A2C): 128.0 ml 56.93 ml/m LA Vol (A4C): 115.0 ml 51.15 ml/m  AORTIC VALVE LVOT Vmax:   73.10 cm/s LVOT Vmean:  52.800 cm/s LVOT VTI:    0.155 m MR Peak grad: 65.0 mmHg   TRICUSPID VALVE MR Vmax:      403.00 cm/s TR Peak grad:   26.8 mmHg                           TR Vmax:        259.00 cm/s                            SHUNTS                           Systemic VTI:  0.16 m                           Systemic Diam: 2.10 cm  Skeet Latch MD Electronically signed by Skeet Latch MD Signature Date/Time: 05/02/2019/4:18:03  PM    Final    Left Atrium 5.5 cm    Recent Lab Findings: Lab Results  Component Value Date   WBC 6.3 03/10/2019   HGB 15.7 03/10/2019   HCT 45.0 03/10/2019   PLT 136 (L) 03/10/2019   GLUCOSE 93 04/02/2019   CHOL 137 10/28/2018   TRIG 215 (H) 10/28/2018   HDL 33 (L) 10/28/2018   LDLCALC 61 10/28/2018   ALT 30 04/02/2019   AST 32 04/02/2019   NA 141 04/02/2019   K 3.9 04/02/2019   CL 102 04/02/2019   CREATININE 1.35 (H) 04/02/2019   BUN 26 04/02/2019   CO2 25 04/02/2019   INR 1.13 09/27/2017   HGBA1C 5.1 09/18/2017   EXAM: CT FFR ANALYSIS  CLINICAL DATA:  73 year old male with chest pain and abnormal coronary CTA.  FINDINGS: FFRct analysis was  performed on the original cardiac CT angiogram dataset. Diagrammatic representation of the FFRct analysis is provided in a separate PDF document in PACS. This dictation was created using the PDF document and an interactive 3D model of the results. 3D model is not available in the EMR/PACS. Normal FFR range is >0.80.  1. Left Main: 0.98.  2. Proximal LAD: 0.86, mid LAD: 0.78, distal LAD: 0.74. 3. LCX: 0.55, however very small lumen. 4. RCA: Proximal: 0.97, mid: 0.84, distal: 0.83. 5. PDA: Occluded in the proximal portion. 6. PLA: 0.5.  IMPRESSION: 1. CT FFR analysis showed severe stenosis in the mid LAD, small PLA and occluded proximal portion of PDA. A cardiac catheterization is recommended. 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   Electronically Signed   By: Ena Dawley   On: 03/04/2019 19:08  ADDENDUM REPORT: 03/03/2019 12:54  CLINICAL DATA:  73 year old male with h/o DM, hypertension, HLP and chest pressure.  EXAM: Cardiac/Coronary  CTA  TECHNIQUE: The patient was scanned on a [employer].  FINDINGS: A 100 kV prospective scan was triggered in the descending thoracic aorta at 111 HU's. Axial non-contrast 3 mm slices were carried  out through the heart. The data set was analyzed on a dedicated work station and scored using the [employer]. Gantry rotation speed was 250 msecs and collimation was .6 mm. No beta blockade and 0.8 mg of sl NTG was given. The 3D data set was reconstructed in 5% intervals of the 67-82 % of the R-R cycle. Diastolic phases were analyzed on a dedicated work station using MPR, MIP and VRT modes. The patient received 80 cc of contrast.  Aorta: Normal size. Mild atherosclerotic plaque and calcifications. No dissection.  Aortic Valve: Trileaflet. Mild leaflet thickening and calcifications.  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 Right dominance.  RCA is a large dominant artery that gives rise to a small non-dominant anomalous LCX artery with posterior course, PDA and PLA. There is minimal calcified plaque in the proximal portion with stenosis 0-24%. There is severe non-calcified plaque in the mid RCA with stenosis > 70%. Distal RCA/PDA/PLA is affected by motion and poor vasodilation, however there appears to be at least moderate diffuse calcified plaque and stenosis 50-69%.  LAD is a large vessel that originated in between left and non-coronary sinus. It gives rise to one large diagonal artery. There is moderate non-calcified plaque in the proximal portion with stenosis 50-69%. This is followed by a diffuse moderate predominantly calcified plaque with stenosis at least 50-69% in the mid portion. Mid to distal LAD has mild diffuse plaque with stenosis 25-49%.  D1 is a medium size artery with mild diffuse plaque.  LCX is a very small non-dominant artery that originates in the proximal RCA, runs posteriorly between aorta and left atrium and gives rise to a very small OM branch. There is at least moderate plaque, however vessel lumen too small for intervention.  Other findings:  Normal pulmonary vein  drainage into the left atrium.  IMPRESSION: 1. Coronary calcium score of 1242. This was 71 percentile for age and sex matched control.  2. 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 Right dominance.  3. Study quality is affected by poor vasodilation, diffuse calcifications and motion associated with atrial fibrillation. However, there is severe stenosis in the mid RCA and possibly in the distal RCA and mid LAD. Additional analysis with CT  FFR will be submitted. CAD-RADS 4.  4. Mildly dilated pulmonary artery measuring 31 mm.  5. There is a filling defect in the most antero-superior portion of the left atrial appendage, this might represent a poor contrast mixing on first pass imaging vs a thrombus.   Electronically Signed   By: Ena Dawley   On: 03/03/2019 12:54   Addended by Dorothy Spark, MD on 03/03/2019 12:57 PM    Study Result  EXAM: OVER-READ INTERPRETATION  CT CHEST  The following report is an over-read performed by radiologist Dr. Rolm Baptise of [facility], [place] on 03/03/2019. This over-read does not include interpretation of cardiac or coronary anatomy or pathology. The coronary CTA interpretation by the cardiologist is attached.  COMPARISON:  11/07/2017  FINDINGS: Vascular: Heart is normal size. Visualized aorta normal caliber. Scattered calcifications in the visualized descending thoracic aorta.  Mediastinum/Nodes: Partially imaged on the 1st image is a mildly prominent prevascular lymph node with a short axis diameter of 12 mm. This is stable since prior study. No additional prominent visualized mediastinal or hilar lymph nodes.  Lungs/Pleura: No confluent opacity or effusion.  Upper Abdomen: Suspect fatty infiltration of the liver. No acute findings.  Musculoskeletal: Chest wall soft tissues are unremarkable. No acute bony abnormality.  IMPRESSION: No acute extra  cardiac abnormality.  Borderline sized prevascular lymph node, stable since prior study.  Fatty infiltration of the liver.  Electronically Signed: By: Rolm Baptise M.D. On: 03/03/2019 10:13       CATh 03/14/2019  Moderately severe three-vessel coronary disease.  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  2 small obtuse marginal branch is supplied on the left lateral wall.  40% proximal LAD with 60 to 70% diffuse mid to distal LAD.  First diagonal contains eccentric 50 to 70% narrowing.  Right coronary is diffusely diseased with mid 60 to 65% stenosis.  Distal 75% stenosis before small PDA.  99% stenosis in the continuation of the right coronary before the second PLA.  The acute marginal branch of the right coronary has multifocal 95% stenoses.  Low normal LVEF approximately 50% with normal EDP.  AO Systolic Pressure Q000111Q mmHg  AO Diastolic Pressure 70 mmHg  AO Mean 96 mmHg  LV Systolic Pressure 0000000 mmHg  LV Diastolic Pressure 3 mmHg  LV EDP 7 mmHg  AOp Systolic Pressure Q000111Q mmHg  AOp Diastolic Pressure 70 mmHg  AOp Mean Pressure 96 mmHg  LVp Systolic Pressure [id number] mmHg  LVp Diastolic Pressure 3 mmHg  LVp EDP Pressure 9 mmHg   RECOMMENDATIONS:   Aggressive risk preventive therapy with LDL target less than 55.  Add long-acting nitrate therapy to improve exertional dyspnea and chest tightness.  If unacceptable symptoms, would consider multivessel coronary bypass grafting.  In absence of high-grade proximal CAD, I feel a measured approach attempting to control symptoms with medications is a reasonable first option. Cath per Dr Tamala Julian     Assessment / Plan:   1/ Coronary Artery disease with anginal symptoms in spite of aggressive medical rx. He notes some improvement symptoms with doubling his dose of  Imdur to 60 mg day, but still limited by chest discomfort and sob.  Anomalous circumflex coronary noted ,  distal vessel my not be bypassable.  2/ Chronic atrial fib on elequis  With severely dilated left atrium 5.5 cm may be contributing to sob with exertion After this visit current echo arranged and results reported. Cardiac CT suggested question  of thrombosis in laa- but subsequent  Review by Dr Meda Coffee likely first pass mixing without evidence of clot  3/ Followed for ground glass opacity right middle lobe. - not mentioned on recent cardiac ct -had radiologist review    Updated xray report -"ADDENDUM REPORT: 05/05/2019 13:02  ADDENDUM: Comparison is again made to the study and report from 11/07/2017. The vague ground-glass nodule in the right middle lobe seen on prior study is stable, measuring 6 mm. Small peripheral left lower lobe nodule seen on prior study is again noted, measuring 5 mm in diameter, surrounded on this study with dependent atelectasis. No new or enlarging pulmonary nodules.  Recommend continued follow-up as recommended on prior study."    I again discussed with the patient is continued anginal symptoms with complex coronary disease as noted in Dr. Thompson Caul note.  With the patient's continued symptomatology in spite of doubling his dose of [place] I discussed with him proceeding with coronary artery bypass grafting in the near future.  Risks and options of bypass surgery were discussed with him in detail.  He notes now that he is limited enough that he is willing to proceed with coronary bypass grafting in the near future but would like to discuss it with his wife first and call back the office tomorrow.  We also discussed possible maze and atrial clipping.   Grace Isaac MD      [address] [place] [address] Office [phone]     05/06/2019 4:00 PM

## 2019-05-07 ENCOUNTER — Other Ambulatory Visit: Payer: Self-pay | Admitting: *Deleted

## 2019-05-07 DIAGNOSIS — I2510 Atherosclerotic heart disease of native coronary artery without angina pectoris: Secondary | ICD-10-CM

## 2019-05-22 NOTE — Progress Notes (Signed)
[id number] - [place], [place] - [address] [place] [address] Phone: [phone] Fax: [phone]  [place], [facility] [address] [place] [address] [place] [address] Phone: [phone] Fax: [phone]    Your procedure is scheduled on Tuesday, January 12th.  Report to [facility] Main Entrance "A" at 5:30 A.M., and check in at the Admitting office.  Call this number if you have problems the morning of surgery:  [phone]  Call [phone] if you have any questions prior to your surgery date Monday-Friday 8am-4pm   Remember:  Do not eat or drink after midnight the night before your surgery (Monday 05/26/2019)   Take these medicines the morning of surgery with A SIP OF WATER  allopurinol (ZYLOPRIM)  isosorbide mononitrate (IMDUR)    If needed - colchicine, nitroGLYCERIN (NITROSTAT), traMADol (ULTRAM)     Follow your surgeon's instructions on when to stop Eliquis.  If no instructions were given by your surgeon then you will need to call the office to get those instructions.    As of today,  STOP taking any Aspirin (unless otherwise instructed by your surgeon), diclofenac Sodium (VOLTAREN), Aleve, Naproxen, Ibuprofen, Motrin, Advil, Goody's, BC's, all herbal medications, fish oil, and all vitamins.  WHAT DO I DO ABOUT MY DIABETES MEDICATION?  Marland Kitchen Do not take metFORMIN (GLUCOPHAGE)/oral diabetes medicines (pills) the morning of surgery.  HOW TO MANAGE YOUR DIABETES BEFORE AND AFTER SURGERY  Why is it important to control my blood sugar before and after surgery? . Improving blood sugar levels before and after surgery helps healing and can limit problems. . A way of improving blood sugar control is eating a healthy diet by: o  Eating less sugar and carbohydrates o  Increasing activity/exercise o  Talking with your doctor about reaching your blood sugar  goals . High blood sugars (greater than 180 mg/dL) can raise your risk of infections and slow your recovery, so you will need to focus on controlling your diabetes during the weeks before surgery. . Make sure that the doctor who takes care of your diabetes knows about your planned surgery including the date and location.  How do I manage my blood sugar before surgery? . Check your blood sugar at least 4 times a day, starting 2 days before surgery, to make sure that the level is not too high or low. . Check your blood sugar the morning of your surgery when you wake up and every 2 hours until you get to the Short Stay unit. o If your blood sugar is less than 70 mg/dL, you will need to treat for low blood sugar: - Do not take insulin. - Treat a low blood sugar (less than 70 mg/dL) with  cup of clear juice (cranberry or apple), 4 glucose tablets, OR glucose gel. - Recheck blood sugar in 15 minutes after treatment (to make sure it is greater than 70 mg/dL). If your blood sugar is not greater than 70 mg/dL on recheck, call [phone] for further instructions. . Report your blood sugar to the short stay nurse when you get to Short Stay.  . If you are admitted to the hospital after surgery: o Your blood sugar will be checked by the staff and you will probably be given insulin after surgery (instead of oral diabetes medicines) to make sure you have good blood sugar levels. o The goal for blood  sugar control after surgery is 80-180 mg/dL.  The Morning of Surgery  Do not wear jewelry.  Do not wear lotions, powders, colognes, or deodorant  Do not shave 48 hours prior to surgery.  Men may shave face and neck.  Do not bring valuables to the hospital.  [facility] is not responsible for any belongings or valuables.  If you are a smoker, DO NOT Smoke 24 hours prior to surgery  If you wear a CPAP at night please bring your mask, tubing, and machine the morning of surgery   Remember that you must have  someone to transport you home after your surgery, and remain with you for 24 hours if you are discharged the same day.  Please bring cases for contacts, glasses, hearing aids, dentures or bridgework because it cannot be worn into surgery.   Leave your suitcase in the car.  After surgery it may be brought to your room.  For patients admitted to the hospital, discharge time will be determined by your treatment team.  Patients discharged the day of surgery will not be allowed to drive home.   Special instructions:   [place]- Preparing For Surgery  Before surgery, you can play an important role. Because skin is not sterile, your skin needs to be as free of germs as possible. You can reduce the number of germs on your skin by washing with CHG (chlorahexidine gluconate) Soap before surgery.  CHG is an antiseptic cleaner which kills germs and bonds with the skin to continue killing germs even after washing.    Oral Hygiene is also important to reduce your risk of infection.  Remember - BRUSH YOUR TEETH THE MORNING OF SURGERY WITH YOUR REGULAR TOOTHPASTE  Please do not use if you have an allergy to CHG or antibacterial soaps. If your skin becomes reddened/irritated stop using the CHG.  Do not shave (including legs and underarms) for at least 48 hours prior to first CHG shower. It is OK to shave your face.  Please follow these instructions carefully.   1. Shower the NIGHT BEFORE SURGERY and the MORNING OF SURGERY with CHG Soap.   2. If you chose to wash your hair, wash your hair first as usual with your normal shampoo.  3. After you shampoo, rinse your hair and body thoroughly to remove the shampoo.  4. Use CHG as you would any other liquid soap. You can apply CHG directly to the skin and wash gently with a scrungie or a clean washcloth.   5. Apply the CHG Soap to your body ONLY FROM THE NECK DOWN.  Do not use on open wounds or open sores. Avoid contact with your eyes, ears, mouth and  genitals (private parts). Wash Face and genitals (private parts)  with your normal soap.   6. Wash thoroughly, paying special attention to the area where your surgery will be performed.  7. Thoroughly rinse your body with warm water from the neck down.  8. DO NOT shower/wash with your normal soap after using and rinsing off the CHG Soap.  9. Pat yourself dry with a CLEAN TOWEL.  10. Wear CLEAN PAJAMAS to bed the night before surgery, wear comfortable clothes the morning of surgery  11. Place CLEAN SHEETS on your bed the night of your first shower and DO NOT SLEEP WITH PETS.  Day of Surgery: Please shower the morning of surgery with the CHG soap Do not apply any deodorants/lotions. Please wear clean clothes to the hospital/surgery center.  Remember to brush your teeth WITH YOUR REGULAR TOOTHPASTE.  Please read over the following fact sheets that you were given.

## 2019-05-23 ENCOUNTER — Other Ambulatory Visit: Payer: Self-pay

## 2019-05-23 ENCOUNTER — Encounter (HOSPITAL_COMMUNITY)
Admission: RE | Admit: 2019-05-23 | Discharge: 2019-05-23 | Disposition: A | Discharge status: Home / Self Care | Payer: Medicare Other | Source: Ambulatory Visit | Attending: Cardiothoracic Surgery | Admitting: Cardiothoracic Surgery

## 2019-05-23 ENCOUNTER — Ambulatory Visit (HOSPITAL_COMMUNITY)
Admission: RE | Admit: 2019-05-23 | Discharge: 2019-05-23 | Disposition: A | Discharge status: Home / Self Care | Payer: Medicare Other | Source: Ambulatory Visit | Attending: Cardiothoracic Surgery | Admitting: Cardiothoracic Surgery

## 2019-05-23 ENCOUNTER — Encounter (HOSPITAL_COMMUNITY): Payer: Self-pay

## 2019-05-23 ENCOUNTER — Other Ambulatory Visit (HOSPITAL_COMMUNITY)
Admission: RE | Admit: 2019-05-23 | Discharge: 2019-05-23 | Disposition: A | Discharge status: Home / Self Care | Payer: Medicare Other | Source: Ambulatory Visit | Attending: Cardiothoracic Surgery | Admitting: Cardiothoracic Surgery

## 2019-05-23 DIAGNOSIS — E119 Type 2 diabetes mellitus without complications: Secondary | ICD-10-CM | POA: Diagnosis not present

## 2019-05-23 DIAGNOSIS — I4891 Unspecified atrial fibrillation: Secondary | ICD-10-CM | POA: Insufficient documentation

## 2019-05-23 DIAGNOSIS — R0602 Shortness of breath: Secondary | ICD-10-CM | POA: Insufficient documentation

## 2019-05-23 DIAGNOSIS — Z01818 Encounter for other preprocedural examination: Secondary | ICD-10-CM | POA: Diagnosis not present

## 2019-05-23 DIAGNOSIS — E785 Hyperlipidemia, unspecified: Secondary | ICD-10-CM | POA: Diagnosis not present

## 2019-05-23 DIAGNOSIS — Z20822 Contact with and (suspected) exposure to covid-19: Secondary | ICD-10-CM | POA: Insufficient documentation

## 2019-05-23 DIAGNOSIS — I2510 Atherosclerotic heart disease of native coronary artery without angina pectoris: Secondary | ICD-10-CM

## 2019-05-23 DIAGNOSIS — Z951 Presence of aortocoronary bypass graft: Secondary | ICD-10-CM | POA: Diagnosis not present

## 2019-05-23 LAB — URINALYSIS, ROUTINE W REFLEX MICROSCOPIC
Bilirubin Urine: NEGATIVE
Glucose, UA: NEGATIVE mg/dL
Hgb urine dipstick: NEGATIVE
Ketones, ur: NEGATIVE mg/dL
Leukocytes,Ua: NEGATIVE
Nitrite: NEGATIVE
Protein, ur: NEGATIVE mg/dL
Specific Gravity, Urine: 1.019 (ref 1.005–1.030)
pH: 5 (ref 5.0–8.0)

## 2019-05-23 LAB — BLOOD GAS, ARTERIAL
Acid-base deficit: 0.2 mmol/L (ref 0.0–2.0)
Bicarbonate: 23.3 mmol/L (ref 20.0–28.0)
Drawn by: 421801
FIO2: 21
O2 Saturation: 98.2 %
Patient temperature: 37
pCO2 arterial: 34.4 mmHg (ref 32.0–48.0)
pH, Arterial: 7.446 (ref 7.350–7.450)
pO2, Arterial: 105 mmHg (ref 83.0–108.0)

## 2019-05-23 LAB — COMPREHENSIVE METABOLIC PANEL
ALT: 29 U/L (ref 0–44)
AST: 32 U/L (ref 15–41)
Albumin: 4.2 g/dL (ref 3.5–5.0)
Alkaline Phosphatase: 56 U/L (ref 38–126)
Anion gap: 11 (ref 5–15)
BUN: 28 mg/dL — ABNORMAL HIGH (ref 8–23)
CO2: 19 mmol/L — ABNORMAL LOW (ref 22–32)
Calcium: 9.2 mg/dL (ref 8.9–10.3)
Chloride: 105 mmol/L (ref 98–111)
Creatinine, Ser: 1.42 mg/dL — ABNORMAL HIGH (ref 0.61–1.24)
GFR calc Af Amer: 56 mL/min — ABNORMAL LOW (ref >60)
GFR calc non Af Amer: 48 mL/min — ABNORMAL LOW (ref >60)
Glucose, Bld: 101 mg/dL — ABNORMAL HIGH (ref 70–99)
Potassium: 4.1 mmol/L (ref 3.5–5.1)
Sodium: 135 mmol/L (ref 135–145)
Total Bilirubin: 1 mg/dL (ref 0.3–1.2)
Total Protein: 6.7 g/dL (ref 6.5–8.1)

## 2019-05-23 LAB — TYPE AND SCREEN
ABO/RH(D): AB POS
Antibody Screen: NEGATIVE

## 2019-05-23 LAB — CBC
HCT: 43.4 % (ref 39.0–52.0)
Hemoglobin: 14.8 g/dL (ref 13.0–17.0)
MCH: 31.6 pg (ref 26.0–34.0)
MCHC: 34.1 g/dL (ref 30.0–36.0)
MCV: 92.7 fL (ref 80.0–100.0)
Platelets: DECREASED 10*3/uL (ref 150–400)
RBC: 4.68 MIL/uL (ref 4.22–5.81)
RDW: 12.8 % (ref 11.5–15.5)
WBC: 5.2 10*3/uL (ref 4.0–10.5)
nRBC: 0 % (ref 0.0–0.2)

## 2019-05-23 LAB — HEMOGLOBIN A1C
Hgb A1c MFr Bld: 5.5 % (ref 4.8–5.6)
Mean Plasma Glucose: 111.15 mg/dL

## 2019-05-23 LAB — GLUCOSE, CAPILLARY: Glucose-Capillary: 96 mg/dL (ref 70–99)

## 2019-05-23 LAB — SURGICAL PCR SCREEN
MRSA, PCR: NEGATIVE
Staphylococcus aureus: NEGATIVE

## 2019-05-23 LAB — PROTIME-INR
INR: 1.1 (ref 0.8–1.2)
Prothrombin Time: 14.2 seconds (ref 11.4–15.2)

## 2019-05-23 LAB — APTT: aPTT: 31 seconds (ref 24–36)

## 2019-05-23 LAB — ABO/RH: ABO/RH(D): AB POS

## 2019-05-23 NOTE — Progress Notes (Signed)
PCP - Dr. Bevelyn Buckles Cardiologist - Truitt Merle, NP  PPM/ICD - n/a Device Orders -  Rep Notified -   Chest x-ray - 05/23/19 EKG - 05/23/19 Stress Test - 02/19/15 ECHO - 05/02/2019 Cardiac Cath - 03/14/2019  Sleep Study - 02/10/2010 CPAP -   Fasting Blood Sugar - 100-105 Checks Blood Sugar 1 time every couple of weeks  Blood Thinner Instructions: last dose of Eliquis was 05/21/19 Aspirin Instructions:  ERAS Protcol - n/a PRE-SURGERY Ensure or G2-   COVID TEST- 05/23/19 results pending   Anesthesia review: yes  Patient denies shortness of breath, fever, cough and chest pain at PAT appointment   All instructions explained to the patient, with a verbal understanding of the material. Patient agrees to go over the instructions while at home for a better understanding. Patient also instructed to self quarantine after being tested for COVID-19. The opportunity to ask questions was provided.

## 2019-05-24 LAB — NOVEL CORONAVIRUS, NAA (HOSP ORDER, SEND-OUT TO REF LAB; TAT 18-24 HRS): SARS-CoV-2, NAA: NOT DETECTED

## 2019-05-26 ENCOUNTER — Ambulatory Visit (HOSPITAL_COMMUNITY)
Admission: RE | Admit: 2019-05-26 | Discharge: 2019-05-26 | Disposition: A | Discharge status: Home / Self Care | Payer: Medicare Other | Source: Ambulatory Visit | Attending: Cardiothoracic Surgery | Admitting: Cardiothoracic Surgery

## 2019-05-26 ENCOUNTER — Other Ambulatory Visit: Payer: Self-pay

## 2019-05-26 DIAGNOSIS — I2510 Atherosclerotic heart disease of native coronary artery without angina pectoris: Secondary | ICD-10-CM

## 2019-05-26 LAB — PULMONARY FUNCTION TEST
DL/VA % pred: 66 %
DL/VA: 2.63 ml/min/mmHg/L
DLCO cor % pred: 73 %
DLCO cor: 19.5 ml/min/mmHg
DLCO unc % pred: 73 %
DLCO unc: 19.6 ml/min/mmHg
FEF 25-75 Post: 3.04 L/sec
FEF 25-75 Pre: 2.27 L/sec
FEF2575-%Change-Post: 34 %
FEF2575-%Pred-Post: 126 %
FEF2575-%Pred-Pre: 93 %
FEV1-%Change-Post: 6 %
FEV1-%Pred-Post: 107 %
FEV1-%Pred-Pre: 101 %
FEV1-Post: 3.57 L
FEV1-Pre: 3.37 L
FEV1FVC-%Change-Post: 5 %
FEV1FVC-%Pred-Pre: 98 %
FEV6-%Change-Post: 0 %
FEV6-%Pred-Post: 104 %
FEV6-%Pred-Pre: 103 %
FEV6-Post: 4.52 L
FEV6-Pre: 4.48 L
FEV6FVC-%Change-Post: 0 %
FEV6FVC-%Pred-Post: 103 %
FEV6FVC-%Pred-Pre: 102 %
FVC-%Change-Post: 0 %
FVC-%Pred-Post: 103 %
FVC-%Pred-Pre: 102 %
FVC-Post: 4.72 L
FVC-Pre: 4.69 L
Post FEV1/FVC ratio: 76 %
Post FEV6/FVC ratio: 97 %
Pre FEV1/FVC ratio: 72 %
Pre FEV6/FVC Ratio: 96 %
RV % pred: 129 %
RV: 3.45 L
TLC % pred: 113 %
TLC: 8.43 L

## 2019-05-26 MED ORDER — SODIUM CHLORIDE 0.9 % IV SOLN
1.5000 g | INTRAVENOUS | Status: AC
  Administered 2019-05-27: 08:00:00 1.5 g via INTRAVENOUS
  Filled 2019-05-26: qty 1.5

## 2019-05-26 MED ORDER — NITROGLYCERIN IN D5W 200-5 MCG/ML-% IV SOLN
2.0000 ug/min | INTRAVENOUS | Status: AC
  Administered 2019-05-27: 10:00:00 5 ug/min via INTRAVENOUS
  Filled 2019-05-26: qty 250

## 2019-05-26 MED ORDER — EPINEPHRINE PF 1 MG/ML IJ SOLN
0.0000 ug/min | INTRAVENOUS | Status: DC
  Filled 2019-05-26: qty 4

## 2019-05-26 MED ORDER — INSULIN REGULAR(HUMAN) IN NACL 100-0.9 UT/100ML-% IV SOLN
INTRAVENOUS | Status: AC
  Administered 2019-05-27: 08:00:00 1 [IU]/h via INTRAVENOUS
  Filled 2019-05-26: qty 100

## 2019-05-26 MED ORDER — PHENYLEPHRINE HCL-NACL 20-0.9 MG/250ML-% IV SOLN
30.0000 ug/min | INTRAVENOUS | Status: AC
  Administered 2019-05-27: 08:00:00 10 ug/min via INTRAVENOUS
  Filled 2019-05-26: qty 250

## 2019-05-26 MED ORDER — DEXMEDETOMIDINE HCL IN NACL 400 MCG/100ML IV SOLN
0.1000 ug/kg/h | INTRAVENOUS | Status: AC
  Administered 2019-05-27: .7 ug/kg/h via INTRAVENOUS
  Filled 2019-05-26: qty 100

## 2019-05-26 MED ORDER — SODIUM CHLORIDE 0.9 % IV SOLN
750.0000 mg | INTRAVENOUS | Status: DC
  Filled 2019-05-26: qty 750

## 2019-05-26 MED ORDER — TRANEXAMIC ACID 1000 MG/10ML IV SOLN
1.5000 mg/kg/h | INTRAVENOUS | Status: AC
  Administered 2019-05-27: 09:00:00 1.5 mg/kg/h via INTRAVENOUS
  Filled 2019-05-26: qty 25

## 2019-05-26 MED ORDER — MAGNESIUM SULFATE 50 % IJ SOLN
40.0000 meq | INTRAMUSCULAR | Status: DC
  Filled 2019-05-26: qty 9.85

## 2019-05-26 MED ORDER — VANCOMYCIN HCL 1500 MG/300ML IV SOLN
1500.0000 mg | INTRAVENOUS | Status: AC
  Administered 2019-05-27: 1500 mg via INTRAVENOUS
  Filled 2019-05-26: qty 300

## 2019-05-26 MED ORDER — POTASSIUM CHLORIDE 2 MEQ/ML IV SOLN
80.0000 meq | INTRAVENOUS | Status: DC
  Filled 2019-05-26: qty 40

## 2019-05-26 MED ORDER — VANCOMYCIN HCL 10 G IV SOLR
1500.0000 mg | INTRAVENOUS | Status: DC
  Filled 2019-05-26: qty 1500

## 2019-05-26 MED ORDER — TRANEXAMIC ACID (OHS) BOLUS VIA INFUSION
15.0000 mg/kg | INTRAVENOUS | Status: AC
  Administered 2019-05-27: 1567.5 mg via INTRAVENOUS
  Filled 2019-05-26: qty 1568

## 2019-05-26 MED ORDER — TRANEXAMIC ACID (OHS) PUMP PRIME SOLUTION
2.0000 mg/kg | INTRAVENOUS | Status: DC
  Filled 2019-05-26: qty 2.09

## 2019-05-26 MED ORDER — SODIUM CHLORIDE 0.9 % IV SOLN
INTRAVENOUS | Status: DC
  Filled 2019-05-26: qty 30

## 2019-05-26 MED ORDER — MILRINONE LACTATE IN DEXTROSE 20-5 MG/100ML-% IV SOLN
0.3000 ug/kg/min | INTRAVENOUS | Status: DC
  Filled 2019-05-26: qty 100

## 2019-05-26 MED ORDER — NOREPINEPHRINE 4 MG/250ML-% IV SOLN
0.0000 ug/min | INTRAVENOUS | Status: DC
  Filled 2019-05-26: qty 250

## 2019-05-26 MED ORDER — ALBUTEROL SULFATE (2.5 MG/3ML) 0.083% IN NEBU
2.5000 mg | INHALATION_SOLUTION | Freq: Once | RESPIRATORY_TRACT | Status: AC
  Administered 2019-05-26: 2.5 mg via RESPIRATORY_TRACT

## 2019-05-26 MED ORDER — PLASMA-LYTE 148 IV SOLN
INTRAVENOUS | Status: AC
  Administered 2019-05-27: 500 mL
  Filled 2019-05-26: qty 2.5

## 2019-05-26 NOTE — Anesthesia Preprocedure Evaluation (Addendum)
Anesthesia Evaluation  Patient identified by MRN, date of birth, ID band Patient awake    Reviewed: Allergy & Precautions, NPO status , Patient's Chart, lab work & pertinent test results, reviewed documented beta blocker date and time   History of Anesthesia Complications (+) PONV  Airway Mallampati: II  TM Distance: >3 FB Neck ROM: Full    Dental  (+) Dental Advisory Given, Edentulous Upper, Edentulous Lower   Pulmonary sleep apnea and Continuous Positive Airway Pressure Ventilation , former smoker,    Pulmonary exam normal breath sounds clear to auscultation       Cardiovascular hypertension, Pt. on home beta blockers and Pt. on medications + angina + CAD  + dysrhythmias Atrial Fibrillation  Rhythm:Irregular Rate:Abnormal  Echo 05/12/2019:  1. Left ventricular ejection fraction, by visual estimation, is 50 to 55%. The left ventricle has low normal function. Left ventricular septal wall thickness was mildly increased. Mildly increased left ventricular posterior wall thickness. There is  mildly increased left ventricular hypertrophy.  2. Left ventricular diastolic function could not be evaluated.  3. The left ventricle has no regional wall motion abnormalities.  4. Global right ventricle has normal systolic function.The right ventricular size is normal. No increase in right ventricular wall thickness.  5. Left atrial size was severely dilated.  6. Right atrial size was severely dilated.  7. The mitral valve is normal in structure. Mild mitral valve regurgitation. No evidence of mitral stenosis.  8. The tricuspid valve is normal in structure. Tricuspid valve regurgitation is mild.  9. The aortic valve is tricuspid. Aortic valve regurgitation is trivial. No evidence of aortic valve sclerosis or stenosis. 10. Pulmonic regurgitation is mild. 11. The pulmonic valve was normal in structure. Pulmonic valve regurgitation is mild. 12.  Normal pulmonary artery systolic pressure. 13. The inferior vena cava is normal in size with greater than 50% respiratory variability, suggesting right atrial pressure of 3 mmHg.   Neuro/Psych PSYCHIATRIC DISORDERS Depression negative neurological ROS     GI/Hepatic negative GI ROS, Neg liver ROS,   Endo/Other  diabetes, Type 2, Oral Hypoglycemic Agents  Renal/GU negative Renal ROS     Musculoskeletal  (+) Arthritis ,   Abdominal   Peds  Hematology  (+) Blood dyscrasia (Eliquis), ,   Anesthesia Other Findings Day of surgery medications reviewed with the patient.  Reproductive/Obstetrics                           Anesthesia Physical Anesthesia Plan  ASA: IV  Anesthesia Plan: General   Post-op Pain Management:    Induction: Intravenous  PONV Risk Score and Plan: 2 and Midazolam  Airway Management Planned: Oral ETT  Additional Equipment: Arterial line, CVP, PA Cath, TEE and Ultrasound Guidance Line Placement  Intra-op Plan:   Post-operative Plan: Post-operative intubation/ventilation  Informed Consent: I have reviewed the patients History and Physical, chart, labs and discussed the procedure including the risks, benefits and alternatives for the proposed anesthesia with the patient or authorized representative who has indicated his/her understanding and acceptance.     Dental advisory given  Plan Discussed with: CRNA  Anesthesia Plan Comments:        Anesthesia Quick Evaluation

## 2019-05-27 ENCOUNTER — Inpatient Hospital Stay (HOSPITAL_COMMUNITY): Payer: Medicare Other

## 2019-05-27 ENCOUNTER — Other Ambulatory Visit: Payer: Self-pay

## 2019-05-27 ENCOUNTER — Inpatient Hospital Stay (HOSPITAL_COMMUNITY): Payer: Medicare Other | Admitting: Physician Assistant

## 2019-05-27 ENCOUNTER — Inpatient Hospital Stay (HOSPITAL_COMMUNITY)
Admission: RE | Disposition: A | Discharge status: Home / Self Care | Payer: Self-pay | Source: Home / Self Care | Attending: Cardiothoracic Surgery

## 2019-05-27 ENCOUNTER — Encounter (HOSPITAL_COMMUNITY): Payer: Self-pay | Admitting: Cardiothoracic Surgery

## 2019-05-27 ENCOUNTER — Inpatient Hospital Stay (HOSPITAL_COMMUNITY): Payer: Medicare Other | Admitting: Anesthesiology

## 2019-05-27 ENCOUNTER — Inpatient Hospital Stay (HOSPITAL_COMMUNITY)
Admission: RE | Admit: 2019-05-27 | Discharge: 2019-06-01 | DRG: 236 | Disposition: A | Discharge status: Home / Self Care | Payer: Medicare Other | Attending: Cardiothoracic Surgery | Admitting: Cardiothoracic Surgery

## 2019-05-27 DIAGNOSIS — D62 Acute posthemorrhagic anemia: Secondary | ICD-10-CM | POA: Diagnosis not present

## 2019-05-27 DIAGNOSIS — E7800 Pure hypercholesterolemia, unspecified: Secondary | ICD-10-CM | POA: Diagnosis present

## 2019-05-27 DIAGNOSIS — I25118 Atherosclerotic heart disease of native coronary artery with other forms of angina pectoris: Secondary | ICD-10-CM | POA: Diagnosis present

## 2019-05-27 DIAGNOSIS — I10 Essential (primary) hypertension: Secondary | ICD-10-CM | POA: Diagnosis present

## 2019-05-27 DIAGNOSIS — Q245 Malformation of coronary vessels: Secondary | ICD-10-CM

## 2019-05-27 DIAGNOSIS — Z8546 Personal history of malignant neoplasm of prostate: Secondary | ICD-10-CM | POA: Diagnosis not present

## 2019-05-27 DIAGNOSIS — I4819 Other persistent atrial fibrillation: Secondary | ICD-10-CM | POA: Diagnosis present

## 2019-05-27 DIAGNOSIS — Z7984 Long term (current) use of oral hypoglycemic drugs: Secondary | ICD-10-CM | POA: Diagnosis not present

## 2019-05-27 DIAGNOSIS — I2510 Atherosclerotic heart disease of native coronary artery without angina pectoris: Secondary | ICD-10-CM

## 2019-05-27 DIAGNOSIS — I25110 Atherosclerotic heart disease of native coronary artery with unstable angina pectoris: Secondary | ICD-10-CM

## 2019-05-27 DIAGNOSIS — Z882 Allergy status to sulfonamides status: Secondary | ICD-10-CM

## 2019-05-27 DIAGNOSIS — E669 Obesity, unspecified: Secondary | ICD-10-CM | POA: Diagnosis present

## 2019-05-27 DIAGNOSIS — C37 Malignant neoplasm of thymus: Secondary | ICD-10-CM

## 2019-05-27 DIAGNOSIS — Z79899 Other long term (current) drug therapy: Secondary | ICD-10-CM | POA: Diagnosis not present

## 2019-05-27 DIAGNOSIS — Z6832 Body mass index (BMI) 32.0-32.9, adult: Secondary | ICD-10-CM | POA: Diagnosis not present

## 2019-05-27 DIAGNOSIS — E785 Hyperlipidemia, unspecified: Secondary | ICD-10-CM | POA: Diagnosis present

## 2019-05-27 DIAGNOSIS — Z7901 Long term (current) use of anticoagulants: Secondary | ICD-10-CM

## 2019-05-27 DIAGNOSIS — Z951 Presence of aortocoronary bypass graft: Secondary | ICD-10-CM

## 2019-05-27 DIAGNOSIS — E119 Type 2 diabetes mellitus without complications: Secondary | ICD-10-CM | POA: Diagnosis present

## 2019-05-27 DIAGNOSIS — Q250 Patent ductus arteriosus: Secondary | ICD-10-CM

## 2019-05-27 DIAGNOSIS — Z91041 Radiographic dye allergy status: Secondary | ICD-10-CM

## 2019-05-27 DIAGNOSIS — Z96652 Presence of left artificial knee joint: Secondary | ICD-10-CM | POA: Diagnosis present

## 2019-05-27 DIAGNOSIS — Z87891 Personal history of nicotine dependence: Secondary | ICD-10-CM | POA: Diagnosis not present

## 2019-05-27 DIAGNOSIS — Z9689 Presence of other specified functional implants: Secondary | ICD-10-CM

## 2019-05-27 DIAGNOSIS — Z8249 Family history of ischemic heart disease and other diseases of the circulatory system: Secondary | ICD-10-CM

## 2019-05-27 DIAGNOSIS — Z881 Allergy status to other antibiotic agents status: Secondary | ICD-10-CM

## 2019-05-27 DIAGNOSIS — G4733 Obstructive sleep apnea (adult) (pediatric): Secondary | ICD-10-CM | POA: Diagnosis present

## 2019-05-27 DIAGNOSIS — D6959 Other secondary thrombocytopenia: Secondary | ICD-10-CM | POA: Diagnosis not present

## 2019-05-27 DIAGNOSIS — Z888 Allergy status to other drugs, medicaments and biological substances status: Secondary | ICD-10-CM

## 2019-05-27 DIAGNOSIS — E8770 Fluid overload, unspecified: Secondary | ICD-10-CM | POA: Diagnosis not present

## 2019-05-27 DIAGNOSIS — R222 Localized swelling, mass and lump, trunk: Secondary | ICD-10-CM

## 2019-05-27 DIAGNOSIS — I25119 Atherosclerotic heart disease of native coronary artery with unspecified angina pectoris: Secondary | ICD-10-CM | POA: Diagnosis present

## 2019-05-27 DIAGNOSIS — Z823 Family history of stroke: Secondary | ICD-10-CM

## 2019-05-27 DIAGNOSIS — R911 Solitary pulmonary nodule: Secondary | ICD-10-CM | POA: Diagnosis present

## 2019-05-27 DIAGNOSIS — Z09 Encounter for follow-up examination after completed treatment for conditions other than malignant neoplasm: Secondary | ICD-10-CM

## 2019-05-27 HISTORY — PX: TEE WITHOUT CARDIOVERSION: SHX5443

## 2019-05-27 HISTORY — DX: Malignant neoplasm of thymus: C37

## 2019-05-27 HISTORY — PX: CLIPPING OF ATRIAL APPENDAGE: SHX5773

## 2019-05-27 HISTORY — PX: CORONARY ARTERY BYPASS GRAFT: SHX141

## 2019-05-27 LAB — POCT I-STAT 7, (LYTES, BLD GAS, ICA,H+H)
Acid-Base Excess: 1 mmol/L (ref 0.0–2.0)
Acid-base deficit: 2 mmol/L (ref 0.0–2.0)
Acid-base deficit: 1 mmol/L (ref 0.0–2.0)
Acid-base deficit: 2 mmol/L (ref 0.0–2.0)
Acid-base deficit: 4 mmol/L — ABNORMAL HIGH (ref 0.0–2.0)
Bicarbonate: 26.7 mmol/L (ref 20.0–28.0)
Bicarbonate: 24.1 mmol/L (ref 20.0–28.0)
Bicarbonate: 23.5 mmol/L (ref 20.0–28.0)
Bicarbonate: 21 mmol/L (ref 20.0–28.0)
Bicarbonate: 22.2 mmol/L (ref 20.0–28.0)
Calcium, Ion: 1.04 mmol/L — ABNORMAL LOW (ref 1.15–1.40)
Calcium, Ion: 1.1 mmol/L — ABNORMAL LOW (ref 1.15–1.40)
Calcium, Ion: 1.12 mmol/L — ABNORMAL LOW (ref 1.15–1.40)
Calcium, Ion: 1.06 mmol/L — ABNORMAL LOW (ref 1.15–1.40)
Calcium, Ion: 1.11 mmol/L — ABNORMAL LOW (ref 1.15–1.40)
HCT: 33 % — ABNORMAL LOW (ref 39.0–52.0)
HCT: 33 % — ABNORMAL LOW (ref 39.0–52.0)
HCT: 35 % — ABNORMAL LOW (ref 39.0–52.0)
HCT: 29 % — ABNORMAL LOW (ref 39.0–52.0)
HCT: 32 % — ABNORMAL LOW (ref 39.0–52.0)
Hemoglobin: 11.2 g/dL — ABNORMAL LOW (ref 13.0–17.0)
Hemoglobin: 11.2 g/dL — ABNORMAL LOW (ref 13.0–17.0)
Hemoglobin: 11.9 g/dL — ABNORMAL LOW (ref 13.0–17.0)
Hemoglobin: 10.9 g/dL — ABNORMAL LOW (ref 13.0–17.0)
Hemoglobin: 9.9 g/dL — ABNORMAL LOW (ref 13.0–17.0)
O2 Saturation: 100 %
O2 Saturation: 97 %
O2 Saturation: 98 %
O2 Saturation: 98 %
O2 Saturation: 98 %
Patient temperature: 35
Patient temperature: 34.4
Patient temperature: 37.3
Potassium: 3.7 mmol/L (ref 3.5–5.1)
Potassium: 3.6 mmol/L (ref 3.5–5.1)
Potassium: 3.6 mmol/L (ref 3.5–5.1)
Potassium: 3.8 mmol/L (ref 3.5–5.1)
Potassium: 4.2 mmol/L (ref 3.5–5.1)
Sodium: 139 mmol/L (ref 135–145)
Sodium: 138 mmol/L (ref 135–145)
Sodium: 139 mmol/L (ref 135–145)
Sodium: 138 mmol/L (ref 135–145)
Sodium: 141 mmol/L (ref 135–145)
TCO2: 28 mmol/L (ref 22–32)
TCO2: 25 mmol/L (ref 22–32)
TCO2: 25 mmol/L (ref 22–32)
TCO2: 22 mmol/L (ref 22–32)
TCO2: 23 mmol/L (ref 22–32)
pCO2 arterial: 45.6 mmHg (ref 32.0–48.0)
pCO2 arterial: 45.3 mmHg (ref 32.0–48.0)
pCO2 arterial: 35 mmHg (ref 32.0–48.0)
pCO2 arterial: 32.1 mmHg (ref 32.0–48.0)
pCO2 arterial: 36.5 mmHg (ref 32.0–48.0)
pH, Arterial: 7.375 (ref 7.350–7.450)
pH, Arterial: 7.334 — ABNORMAL LOW (ref 7.350–7.450)
pH, Arterial: 7.426 (ref 7.350–7.450)
pH, Arterial: 7.393 (ref 7.350–7.450)
pH, Arterial: 7.412 (ref 7.350–7.450)
pO2, Arterial: 329 mmHg — ABNORMAL HIGH (ref 83.0–108.0)
pO2, Arterial: 100 mmHg (ref 83.0–108.0)
pO2, Arterial: 86 mmHg (ref 83.0–108.0)
pO2, Arterial: 112 mmHg — ABNORMAL HIGH (ref 83.0–108.0)
pO2, Arterial: 85 mmHg (ref 83.0–108.0)

## 2019-05-27 LAB — POCT I-STAT, CHEM 8
BUN: 22 mg/dL (ref 8–23)
BUN: 20 mg/dL (ref 8–23)
BUN: 21 mg/dL (ref 8–23)
BUN: 20 mg/dL (ref 8–23)
BUN: 20 mg/dL (ref 8–23)
BUN: 19 mg/dL (ref 8–23)
Calcium, Ion: 1.23 mmol/L (ref 1.15–1.40)
Calcium, Ion: 1.15 mmol/L (ref 1.15–1.40)
Calcium, Ion: 1.17 mmol/L (ref 1.15–1.40)
Calcium, Ion: 1.04 mmol/L — ABNORMAL LOW (ref 1.15–1.40)
Calcium, Ion: 1.11 mmol/L — ABNORMAL LOW (ref 1.15–1.40)
Calcium, Ion: 1.12 mmol/L — ABNORMAL LOW (ref 1.15–1.40)
Chloride: 102 mmol/L (ref 98–111)
Chloride: 103 mmol/L (ref 98–111)
Chloride: 104 mmol/L (ref 98–111)
Chloride: 100 mmol/L (ref 98–111)
Chloride: 100 mmol/L (ref 98–111)
Chloride: 101 mmol/L (ref 98–111)
Creatinine, Ser: 1 mg/dL (ref 0.61–1.24)
Creatinine, Ser: 0.9 mg/dL (ref 0.61–1.24)
Creatinine, Ser: 0.9 mg/dL (ref 0.61–1.24)
Creatinine, Ser: 0.9 mg/dL (ref 0.61–1.24)
Creatinine, Ser: 0.9 mg/dL (ref 0.61–1.24)
Creatinine, Ser: 1 mg/dL (ref 0.61–1.24)
Glucose, Bld: 114 mg/dL — ABNORMAL HIGH (ref 70–99)
Glucose, Bld: 105 mg/dL — ABNORMAL HIGH (ref 70–99)
Glucose, Bld: 114 mg/dL — ABNORMAL HIGH (ref 70–99)
Glucose, Bld: 112 mg/dL — ABNORMAL HIGH (ref 70–99)
Glucose, Bld: 155 mg/dL — ABNORMAL HIGH (ref 70–99)
Glucose, Bld: 145 mg/dL — ABNORMAL HIGH (ref 70–99)
HCT: 38 % — ABNORMAL LOW (ref 39.0–52.0)
HCT: 35 % — ABNORMAL LOW (ref 39.0–52.0)
HCT: 37 % — ABNORMAL LOW (ref 39.0–52.0)
HCT: 33 % — ABNORMAL LOW (ref 39.0–52.0)
HCT: 31 % — ABNORMAL LOW (ref 39.0–52.0)
HCT: 31 % — ABNORMAL LOW (ref 39.0–52.0)
Hemoglobin: 12.9 g/dL — ABNORMAL LOW (ref 13.0–17.0)
Hemoglobin: 11.9 g/dL — ABNORMAL LOW (ref 13.0–17.0)
Hemoglobin: 12.6 g/dL — ABNORMAL LOW (ref 13.0–17.0)
Hemoglobin: 11.2 g/dL — ABNORMAL LOW (ref 13.0–17.0)
Hemoglobin: 10.5 g/dL — ABNORMAL LOW (ref 13.0–17.0)
Hemoglobin: 10.5 g/dL — ABNORMAL LOW (ref 13.0–17.0)
Potassium: 3.8 mmol/L (ref 3.5–5.1)
Potassium: 3.6 mmol/L (ref 3.5–5.1)
Potassium: 3.9 mmol/L (ref 3.5–5.1)
Potassium: 3.7 mmol/L (ref 3.5–5.1)
Potassium: 4 mmol/L (ref 3.5–5.1)
Potassium: 3.6 mmol/L (ref 3.5–5.1)
Sodium: 138 mmol/L (ref 135–145)
Sodium: 137 mmol/L (ref 135–145)
Sodium: 139 mmol/L (ref 135–145)
Sodium: 139 mmol/L (ref 135–145)
Sodium: 135 mmol/L (ref 135–145)
Sodium: 137 mmol/L (ref 135–145)
TCO2: 27 mmol/L (ref 22–32)
TCO2: 26 mmol/L (ref 22–32)
TCO2: 26 mmol/L (ref 22–32)
TCO2: 26 mmol/L (ref 22–32)
TCO2: 27 mmol/L (ref 22–32)
TCO2: 23 mmol/L (ref 22–32)

## 2019-05-27 LAB — CBC
HCT: 39.7 % (ref 39.0–52.0)
HCT: 35 % — ABNORMAL LOW (ref 39.0–52.0)
HCT: 33.7 % — ABNORMAL LOW (ref 39.0–52.0)
Hemoglobin: 13.7 g/dL (ref 13.0–17.0)
Hemoglobin: 11.8 g/dL — ABNORMAL LOW (ref 13.0–17.0)
Hemoglobin: 11.6 g/dL — ABNORMAL LOW (ref 13.0–17.0)
MCH: 32 pg (ref 26.0–34.0)
MCH: 31.6 pg (ref 26.0–34.0)
MCH: 32 pg (ref 26.0–34.0)
MCHC: 34.5 g/dL (ref 30.0–36.0)
MCHC: 33.7 g/dL (ref 30.0–36.0)
MCHC: 34.4 g/dL (ref 30.0–36.0)
MCV: 92.8 fL (ref 80.0–100.0)
MCV: 93.8 fL (ref 80.0–100.0)
MCV: 92.8 fL (ref 80.0–100.0)
Platelets: 94 10*3/uL — ABNORMAL LOW (ref 150–400)
Platelets: 81 10*3/uL — ABNORMAL LOW (ref 150–400)
Platelets: 70 10*3/uL — ABNORMAL LOW (ref 150–400)
RBC: 4.28 MIL/uL (ref 4.22–5.81)
RBC: 3.73 MIL/uL — ABNORMAL LOW (ref 4.22–5.81)
RBC: 3.63 MIL/uL — ABNORMAL LOW (ref 4.22–5.81)
RDW: 12.8 % (ref 11.5–15.5)
RDW: 12.8 % (ref 11.5–15.5)
RDW: 12.7 % (ref 11.5–15.5)
WBC: 3.4 10*3/uL — ABNORMAL LOW (ref 4.0–10.5)
WBC: 8.5 10*3/uL (ref 4.0–10.5)
WBC: 7 10*3/uL (ref 4.0–10.5)
nRBC: 0 % (ref 0.0–0.2)
nRBC: 0 % (ref 0.0–0.2)
nRBC: 0 % (ref 0.0–0.2)

## 2019-05-27 LAB — BASIC METABOLIC PANEL
Anion gap: 6 (ref 5–15)
BUN: 18 mg/dL (ref 8–23)
CO2: 22 mmol/L (ref 22–32)
Calcium: 7.6 mg/dL — ABNORMAL LOW (ref 8.9–10.3)
Chloride: 107 mmol/L (ref 98–111)
Creatinine, Ser: 1.19 mg/dL (ref 0.61–1.24)
GFR calc Af Amer: >60 mL/min (ref >60)
GFR calc non Af Amer: 59 mL/min — ABNORMAL LOW (ref >60)
Glucose, Bld: 140 mg/dL — ABNORMAL HIGH (ref 70–99)
Potassium: 4.2 mmol/L (ref 3.5–5.1)
Sodium: 135 mmol/L (ref 135–145)

## 2019-05-27 LAB — PROTIME-INR
INR: 1.6 — ABNORMAL HIGH (ref 0.8–1.2)
Prothrombin Time: 18.5 seconds — ABNORMAL HIGH (ref 11.4–15.2)

## 2019-05-27 LAB — GLUCOSE, CAPILLARY
Glucose-Capillary: 125 mg/dL — ABNORMAL HIGH (ref 70–99)
Glucose-Capillary: 112 mg/dL — ABNORMAL HIGH (ref 70–99)
Glucose-Capillary: 108 mg/dL — ABNORMAL HIGH (ref 70–99)
Glucose-Capillary: 106 mg/dL — ABNORMAL HIGH (ref 70–99)
Glucose-Capillary: 109 mg/dL — ABNORMAL HIGH (ref 70–99)
Glucose-Capillary: 123 mg/dL — ABNORMAL HIGH (ref 70–99)
Glucose-Capillary: 110 mg/dL — ABNORMAL HIGH (ref 70–99)
Glucose-Capillary: 132 mg/dL — ABNORMAL HIGH (ref 70–99)
Glucose-Capillary: 130 mg/dL — ABNORMAL HIGH (ref 70–99)

## 2019-05-27 LAB — HEMOGLOBIN AND HEMATOCRIT, BLOOD
HCT: 32 % — ABNORMAL LOW (ref 39.0–52.0)
Hemoglobin: 11.1 g/dL — ABNORMAL LOW (ref 13.0–17.0)

## 2019-05-27 LAB — ECHO INTRAOPERATIVE TEE
Height: 72 in
Weight: 3686.09 oz

## 2019-05-27 LAB — PLATELET COUNT: Platelets: 97 10*3/uL — ABNORMAL LOW (ref 150–400)

## 2019-05-27 LAB — MAGNESIUM: Magnesium: 2.8 mg/dL — ABNORMAL HIGH (ref 1.7–2.4)

## 2019-05-27 LAB — APTT: aPTT: 34 seconds (ref 24–36)

## 2019-05-27 SURGERY — CORONARY ARTERY BYPASS GRAFTING (CABG)
Anesthesia: General | Site: Chest

## 2019-05-27 MED ORDER — SODIUM CHLORIDE (PF) 0.9 % IJ SOLN
OROMUCOSAL | Status: DC | PRN
  Administered 2019-05-27 (×3): 4 mL via TOPICAL

## 2019-05-27 MED ORDER — DEXMEDETOMIDINE HCL IN NACL 400 MCG/100ML IV SOLN
0.0000 ug/kg/h | INTRAVENOUS | Status: DC
  Administered 2019-05-27: 0.5 ug/kg/h via INTRAVENOUS
  Filled 2019-05-27: qty 100

## 2019-05-27 MED ORDER — LACTATED RINGERS IV SOLN: INTRAVENOUS | Status: DC | PRN

## 2019-05-27 MED ORDER — ROCURONIUM BROMIDE 10 MG/ML (PF) SYRINGE
PREFILLED_SYRINGE | INTRAVENOUS | Status: AC
  Filled 2019-05-27: qty 10

## 2019-05-27 MED ORDER — ACETAMINOPHEN 500 MG PO TABS
1000.0000 mg | ORAL_TABLET | Freq: Four times a day (QID) | ORAL | Status: DC
  Administered 2019-05-28 – 2019-05-29 (×6): 1000 mg via ORAL
  Filled 2019-05-27 (×6): qty 2

## 2019-05-27 MED ORDER — MIDAZOLAM HCL 2 MG/2ML IJ SOLN
2.0000 mg | INTRAMUSCULAR | Status: DC | PRN
  Administered 2019-05-27: 2 mg via INTRAVENOUS
  Filled 2019-05-27: qty 2

## 2019-05-27 MED ORDER — CHLORHEXIDINE GLUCONATE CLOTH 2 % EX PADS
6.0000 | MEDICATED_PAD | Freq: Every day | CUTANEOUS | Status: DC
  Administered 2019-05-28: 6 via TOPICAL

## 2019-05-27 MED ORDER — ARTIFICIAL TEARS OPHTHALMIC OINT
TOPICAL_OINTMENT | OPHTHALMIC | Status: DC | PRN
  Administered 2019-05-27: 1 via OPHTHALMIC

## 2019-05-27 MED ORDER — ACETAMINOPHEN 650 MG RE SUPP
650.0000 mg | Freq: Once | RECTAL | Status: AC
  Administered 2019-05-27: 650 mg via RECTAL

## 2019-05-27 MED ORDER — 0.9 % SODIUM CHLORIDE (POUR BTL) OPTIME
TOPICAL | Status: DC | PRN
  Administered 2019-05-27: 6000 mL

## 2019-05-27 MED ORDER — SODIUM CHLORIDE 0.9 % IV SOLN
1.5000 g | Freq: Two times a day (BID) | INTRAVENOUS | Status: DC
  Administered 2019-05-27 – 2019-05-28 (×3): 1.5 g via INTRAVENOUS
  Filled 2019-05-27 (×4): qty 1.5

## 2019-05-27 MED ORDER — PHENYLEPHRINE 40 MCG/ML (10ML) SYRINGE FOR IV PUSH (FOR BLOOD PRESSURE SUPPORT)
PREFILLED_SYRINGE | INTRAVENOUS | Status: AC
  Filled 2019-05-27: qty 20

## 2019-05-27 MED ORDER — MIDAZOLAM HCL 5 MG/5ML IJ SOLN
INTRAMUSCULAR | Status: DC | PRN
  Administered 2019-05-27: 2 mg via INTRAVENOUS
  Administered 2019-05-27 (×5): 1 mg via INTRAVENOUS

## 2019-05-27 MED ORDER — ALBUMIN HUMAN 5 % IV SOLN
250.0000 mL | INTRAVENOUS | Status: AC | PRN
  Administered 2019-05-27 (×4): 12.5 g via INTRAVENOUS
  Filled 2019-05-27: qty 500

## 2019-05-27 MED ORDER — METOPROLOL TARTRATE 12.5 MG HALF TABLET
12.5000 mg | ORAL_TABLET | Freq: Once | ORAL | Status: AC
  Administered 2019-05-27: 12.5 mg via ORAL
  Filled 2019-05-27: qty 1

## 2019-05-27 MED ORDER — DOCUSATE SODIUM 100 MG PO CAPS
200.0000 mg | ORAL_CAPSULE | Freq: Every day | ORAL | Status: DC
  Administered 2019-05-28: 200 mg via ORAL
  Filled 2019-05-27: qty 2

## 2019-05-27 MED ORDER — DOPAMINE-DEXTROSE 3.2-5 MG/ML-% IV SOLN
0.0000 ug/kg/min | INTRAVENOUS | Status: DC
  Filled 2019-05-27: qty 250

## 2019-05-27 MED ORDER — METOPROLOL TARTRATE 5 MG/5ML IV SOLN: 2.5000 mg | INTRAVENOUS | Status: DC | PRN

## 2019-05-27 MED ORDER — CHLORHEXIDINE GLUCONATE 0.12% ORAL RINSE (MEDLINE KIT)
15.0000 mL | Freq: Two times a day (BID) | OROMUCOSAL | Status: DC
  Administered 2019-05-27: 15 mL via OROMUCOSAL

## 2019-05-27 MED ORDER — FAMOTIDINE IN NACL 20-0.9 MG/50ML-% IV SOLN
20.0000 mg | Freq: Two times a day (BID) | INTRAVENOUS | Status: AC
  Administered 2019-05-27: 20 mg via INTRAVENOUS

## 2019-05-27 MED ORDER — SODIUM CHLORIDE 0.9 % IV SOLN
INTRAVENOUS | Status: DC | PRN
  Administered 2019-05-27: 750 mg via INTRAVENOUS

## 2019-05-27 MED ORDER — PROPOFOL 10 MG/ML IV BOLUS
INTRAVENOUS | Status: DC | PRN
  Administered 2019-05-27: 20 mg via INTRAVENOUS
  Administered 2019-05-27: 100 mg via INTRAVENOUS
  Administered 2019-05-27: 30 mg via INTRAVENOUS

## 2019-05-27 MED ORDER — LACTATED RINGERS IV SOLN
500.0000 mL | Freq: Once | INTRAVENOUS | Status: AC | PRN
  Administered 2019-05-27: 500 mL via INTRAVENOUS

## 2019-05-27 MED ORDER — BISACODYL 5 MG PO TBEC
10.0000 mg | DELAYED_RELEASE_TABLET | Freq: Every day | ORAL | Status: DC
  Administered 2019-05-28: 10 mg via ORAL
  Filled 2019-05-27: qty 2

## 2019-05-27 MED ORDER — METOPROLOL TARTRATE 12.5 MG HALF TABLET
12.5000 mg | ORAL_TABLET | Freq: Two times a day (BID) | ORAL | Status: DC
  Administered 2019-05-28 (×2): 12.5 mg via ORAL
  Filled 2019-05-27 (×2): qty 1

## 2019-05-27 MED ORDER — LACTATED RINGERS IV SOLN: INTRAVENOUS | Status: DC

## 2019-05-27 MED ORDER — ONDANSETRON HCL 4 MG/2ML IJ SOLN: 4.0000 mg | Freq: Four times a day (QID) | INTRAMUSCULAR | Status: DC | PRN

## 2019-05-27 MED ORDER — SODIUM CHLORIDE 0.45 % IV SOLN: INTRAVENOUS | Status: DC | PRN

## 2019-05-27 MED ORDER — ATORVASTATIN CALCIUM 10 MG PO TABS
10.0000 mg | ORAL_TABLET | Freq: Every day | ORAL | Status: DC
  Administered 2019-05-28: 10 mg via ORAL
  Filled 2019-05-27: qty 1

## 2019-05-27 MED ORDER — DEXTROSE 50 % IV SOLN: 0.0000 mL | INTRAVENOUS | Status: DC | PRN

## 2019-05-27 MED ORDER — ACETAMINOPHEN 160 MG/5ML PO SOLN: 650.0000 mg | Freq: Once | ORAL | Status: AC

## 2019-05-27 MED ORDER — PANTOPRAZOLE SODIUM 40 MG PO TBEC: 40.0000 mg | DELAYED_RELEASE_TABLET | Freq: Every day | ORAL | Status: DC

## 2019-05-27 MED ORDER — CHLORHEXIDINE GLUCONATE 4 % EX LIQD: 30.0000 mL | CUTANEOUS | Status: DC

## 2019-05-27 MED ORDER — HEPARIN SODIUM (PORCINE) 1000 UNIT/ML IJ SOLN
INTRAMUSCULAR | Status: DC | PRN
  Administered 2019-05-27: 35000 [IU] via INTRAVENOUS

## 2019-05-27 MED ORDER — PROPOFOL 10 MG/ML IV BOLUS
INTRAVENOUS | Status: AC
  Filled 2019-05-27: qty 20

## 2019-05-27 MED ORDER — ASPIRIN EC 325 MG PO TBEC
325.0000 mg | DELAYED_RELEASE_TABLET | Freq: Every day | ORAL | Status: DC
  Administered 2019-05-28: 325 mg via ORAL
  Filled 2019-05-27: qty 1

## 2019-05-27 MED ORDER — NITROGLYCERIN IN D5W 200-5 MCG/ML-% IV SOLN: 0.0000 ug/min | INTRAVENOUS | Status: DC

## 2019-05-27 MED ORDER — SODIUM CHLORIDE 0.9 % IV SOLN: INTRAVENOUS | Status: DC

## 2019-05-27 MED ORDER — PHENYLEPHRINE HCL-NACL 20-0.9 MG/250ML-% IV SOLN: 0.0000 ug/min | INTRAVENOUS | Status: DC

## 2019-05-27 MED ORDER — ARTIFICIAL TEARS OPHTHALMIC OINT
TOPICAL_OINTMENT | OPHTHALMIC | Status: AC
  Filled 2019-05-27: qty 10.5

## 2019-05-27 MED ORDER — SODIUM CHLORIDE (PF) 0.9 % IJ SOLN
INTRAMUSCULAR | Status: AC
  Filled 2019-05-27: qty 10

## 2019-05-27 MED ORDER — INSULIN REGULAR(HUMAN) IN NACL 100-0.9 UT/100ML-% IV SOLN: INTRAVENOUS | Status: DC

## 2019-05-27 MED ORDER — FENTANYL CITRATE (PF) 250 MCG/5ML IJ SOLN
INTRAMUSCULAR | Status: AC
  Filled 2019-05-27: qty 25

## 2019-05-27 MED ORDER — FENTANYL CITRATE (PF) 250 MCG/5ML IJ SOLN
INTRAMUSCULAR | Status: DC | PRN
  Administered 2019-05-27: 150 ug via INTRAVENOUS
  Administered 2019-05-27 (×2): 50 ug via INTRAVENOUS
  Administered 2019-05-27: 150 ug via INTRAVENOUS
  Administered 2019-05-27 (×2): 50 ug via INTRAVENOUS
  Administered 2019-05-27: 100 ug via INTRAVENOUS
  Administered 2019-05-27: 150 ug via INTRAVENOUS
  Administered 2019-05-27 (×2): 50 ug via INTRAVENOUS
  Administered 2019-05-27: 250 ug via INTRAVENOUS
  Administered 2019-05-27 (×2): 100 ug via INTRAVENOUS
  Administered 2019-05-27: 50 ug via INTRAVENOUS
  Administered 2019-05-27: 100 ug via INTRAVENOUS
  Administered 2019-05-27: 50 ug via INTRAVENOUS

## 2019-05-27 MED ORDER — ROCURONIUM BROMIDE 10 MG/ML (PF) SYRINGE
PREFILLED_SYRINGE | INTRAVENOUS | Status: AC
  Filled 2019-05-27: qty 20

## 2019-05-27 MED ORDER — CHLORHEXIDINE GLUCONATE 0.12 % MT SOLN
15.0000 mL | Freq: Once | OROMUCOSAL | Status: AC
  Administered 2019-05-27: 15 mL via OROMUCOSAL
  Filled 2019-05-27: qty 15

## 2019-05-27 MED ORDER — FENTANYL CITRATE (PF) 250 MCG/5ML IJ SOLN
INTRAMUSCULAR | Status: AC
  Filled 2019-05-27: qty 5

## 2019-05-27 MED ORDER — SODIUM CHLORIDE 0.9% FLUSH: 3.0000 mL | INTRAVENOUS | Status: DC | PRN

## 2019-05-27 MED ORDER — SODIUM CHLORIDE 0.9 % IV SOLN: 250.0000 mL | INTRAVENOUS | Status: DC

## 2019-05-27 MED ORDER — TRAMADOL HCL 50 MG PO TABS: 50.0000 mg | ORAL_TABLET | ORAL | Status: DC | PRN

## 2019-05-27 MED ORDER — HEPARIN SODIUM (PORCINE) 1000 UNIT/ML IJ SOLN
INTRAMUSCULAR | Status: AC
  Filled 2019-05-27: qty 1

## 2019-05-27 MED ORDER — MIDAZOLAM HCL (PF) 10 MG/2ML IJ SOLN
INTRAMUSCULAR | Status: AC
  Filled 2019-05-27: qty 2

## 2019-05-27 MED ORDER — MORPHINE SULFATE (PF) 2 MG/ML IV SOLN
1.0000 mg | INTRAVENOUS | Status: DC | PRN
  Administered 2019-05-27 (×2): 2 mg via INTRAVENOUS
  Administered 2019-05-27: 4 mg via INTRAVENOUS
  Administered 2019-05-27: 2 mg via INTRAVENOUS
  Administered 2019-05-28 – 2019-05-29 (×5): 4 mg via INTRAVENOUS
  Filled 2019-05-27 (×3): qty 1
  Filled 2019-05-27 (×6): qty 2

## 2019-05-27 MED ORDER — POTASSIUM CHLORIDE 10 MEQ/50ML IV SOLN
10.0000 meq | INTRAVENOUS | Status: AC
  Administered 2019-05-27 (×3): 10 meq via INTRAVENOUS

## 2019-05-27 MED ORDER — PROTAMINE SULFATE 10 MG/ML IV SOLN
INTRAVENOUS | Status: DC | PRN
  Administered 2019-05-27: 20 mg via INTRAVENOUS
  Administered 2019-05-27: 330 mg via INTRAVENOUS

## 2019-05-27 MED ORDER — ROCURONIUM BROMIDE 10 MG/ML (PF) SYRINGE
PREFILLED_SYRINGE | INTRAVENOUS | Status: DC | PRN
  Administered 2019-05-27 (×3): 50 mg via INTRAVENOUS
  Administered 2019-05-27: 100 mg via INTRAVENOUS

## 2019-05-27 MED ORDER — MAGNESIUM SULFATE 4 GM/100ML IV SOLN
4.0000 g | Freq: Once | INTRAVENOUS | Status: AC
  Administered 2019-05-27: 4 g via INTRAVENOUS
  Filled 2019-05-27: qty 100

## 2019-05-27 MED ORDER — CHLORHEXIDINE GLUCONATE 0.12 % MT SOLN
15.0000 mL | OROMUCOSAL | Status: AC
  Administered 2019-05-27: 15 mL via OROMUCOSAL

## 2019-05-27 MED ORDER — HEMOSTATIC AGENTS (NO CHARGE) OPTIME
TOPICAL | Status: DC | PRN
  Administered 2019-05-27 (×3): 1 via TOPICAL

## 2019-05-27 MED ORDER — SODIUM CHLORIDE 0.9% FLUSH: 3.0000 mL | Freq: Two times a day (BID) | INTRAVENOUS | Status: DC

## 2019-05-27 MED ORDER — METOPROLOL TARTRATE 25 MG/10 ML ORAL SUSPENSION
12.5000 mg | Freq: Two times a day (BID) | ORAL | Status: DC
  Filled 2019-05-27: qty 5

## 2019-05-27 MED ORDER — ORAL CARE MOUTH RINSE: 15.0000 mL | OROMUCOSAL | Status: DC

## 2019-05-27 MED ORDER — ACETAMINOPHEN 160 MG/5ML PO SOLN: 1000.0000 mg | Freq: Four times a day (QID) | ORAL | Status: DC

## 2019-05-27 MED ORDER — ALBUMIN HUMAN 5 % IV SOLN: INTRAVENOUS | Status: DC | PRN

## 2019-05-27 MED ORDER — NITROGLYCERIN 0.2 MG/ML ON CALL CATH LAB
INTRAVENOUS | Status: DC | PRN
  Administered 2019-05-27 (×2): 10 ug via INTRAVENOUS

## 2019-05-27 MED ORDER — VANCOMYCIN HCL IN DEXTROSE 1-5 GM/200ML-% IV SOLN
1000.0000 mg | Freq: Once | INTRAVENOUS | Status: AC
  Administered 2019-05-27: 1000 mg via INTRAVENOUS
  Filled 2019-05-27: qty 200

## 2019-05-27 MED ORDER — BISACODYL 10 MG RE SUPP: 10.0000 mg | Freq: Every day | RECTAL | Status: DC

## 2019-05-27 MED ORDER — OXYCODONE HCL 5 MG PO TABS
5.0000 mg | ORAL_TABLET | ORAL | Status: DC | PRN
  Administered 2019-05-28 (×3): 10 mg via ORAL
  Administered 2019-05-28 (×2): 5 mg via ORAL
  Administered 2019-05-29: 10 mg via ORAL
  Filled 2019-05-27: qty 1
  Filled 2019-05-27 (×3): qty 2
  Filled 2019-05-27: qty 1
  Filled 2019-05-27: qty 2

## 2019-05-27 MED ORDER — ASPIRIN 81 MG PO CHEW: 324.0000 mg | CHEWABLE_TABLET | Freq: Every day | ORAL | Status: DC

## 2019-05-27 SURGICAL SUPPLY — 80 items
ATRICLIP EXCLUSION VLAA 45 (Miscellaneous) ×3 IMPLANT
ATRICLIP EXCLUSION VLAA 45MM (Miscellaneous) ×1 IMPLANT
BAG DECANTER FOR FLEXI CONT (MISCELLANEOUS) ×4 IMPLANT
BLADE CLIPPER SURG (BLADE) ×4 IMPLANT
BLADE STERNUM SYSTEM 6 (BLADE) ×4 IMPLANT
BNDG ELASTIC 4X5.8 VLCR STR LF (GAUZE/BANDAGES/DRESSINGS) ×4 IMPLANT
BNDG ELASTIC 6X10 VLCR STRL LF (GAUZE/BANDAGES/DRESSINGS) ×4 IMPLANT
BNDG ELASTIC 6X5.8 VLCR STR LF (GAUZE/BANDAGES/DRESSINGS) ×4 IMPLANT
BNDG GAUZE ELAST 4 BULKY (GAUZE/BANDAGES/DRESSINGS) ×4 IMPLANT
CANISTER SUCT 3000ML PPV (MISCELLANEOUS) ×4 IMPLANT
CARDIOBLATE CARDIAC ABLATION (MISCELLANEOUS)
CATH CPB KIT GERHARDT (MISCELLANEOUS) ×4 IMPLANT
CATH THORACIC 28FR (CATHETERS) ×4 IMPLANT
CONT SPEC 4OZ CLIKSEAL STRL BL (MISCELLANEOUS) ×4 IMPLANT
DEVICE CARDIOBLATE CARDIAC ABL (MISCELLANEOUS) IMPLANT
DEVICE EXCLUSIN ATRCLP VLAA 45 (Miscellaneous) ×2 IMPLANT
DRAIN CHANNEL 28F RND 3/8 FF (WOUND CARE) ×4 IMPLANT
DRAPE CARDIOVASCULAR INCISE (DRAPES) ×2
DRAPE SLUSH/WARMER DISC (DRAPES) ×4 IMPLANT
DRAPE SRG 135X102X78XABS (DRAPES) ×2 IMPLANT
DRSG AQUACEL AG ADV 3.5X14 (GAUZE/BANDAGES/DRESSINGS) ×4 IMPLANT
ELECT BLADE 4.0 EZ CLEAN MEGAD (MISCELLANEOUS) ×4
ELECT REM PT RETURN 9FT ADLT (ELECTROSURGICAL) ×8
ELECTRODE BLDE 4.0 EZ CLN MEGD (MISCELLANEOUS) ×2 IMPLANT
ELECTRODE REM PT RTRN 9FT ADLT (ELECTROSURGICAL) ×4 IMPLANT
FELT TEFLON 1X6 (MISCELLANEOUS) ×8 IMPLANT
GAUZE SPONGE 4X4 12PLY STRL (GAUZE/BANDAGES/DRESSINGS) ×8 IMPLANT
GLOVE BIO SURGEON STRL SZ 6 (GLOVE) ×20 IMPLANT
GLOVE BIO SURGEON STRL SZ 6.5 (GLOVE) ×18 IMPLANT
GLOVE BIO SURGEON STRL SZ7 (GLOVE) ×4 IMPLANT
GLOVE BIO SURGEON STRL SZ7.5 (GLOVE) ×8 IMPLANT
GLOVE BIO SURGEONS STRL SZ 6.5 (GLOVE) ×6
GOWN STRL REUS W/ TWL LRG LVL3 (GOWN DISPOSABLE) ×20 IMPLANT
GOWN STRL REUS W/TWL LRG LVL3 (GOWN DISPOSABLE) ×20
HEMOSTAT POWDER SURGIFOAM 1G (HEMOSTASIS) ×12 IMPLANT
HEMOSTAT SURGICEL 2X14 (HEMOSTASIS) ×4 IMPLANT
KIT BASIN OR (CUSTOM PROCEDURE TRAY) ×4 IMPLANT
KIT CATH SUCT 8FR (CATHETERS) ×4 IMPLANT
KIT SUCTION CATH 14FR (SUCTIONS) ×8 IMPLANT
KIT TURNOVER KIT B (KITS) ×4 IMPLANT
KIT VASOVIEW HEMOPRO 2 VH 4000 (KITS) ×4 IMPLANT
LEAD PACING MYOCARDI (MISCELLANEOUS) ×4 IMPLANT
MARKER GRAFT CORONARY BYPASS (MISCELLANEOUS) ×12 IMPLANT
NS IRRIG 1000ML POUR BTL (IV SOLUTION) ×24 IMPLANT
PACK E OPEN HEART (SUTURE) ×4 IMPLANT
PACK OPEN HEART (CUSTOM PROCEDURE TRAY) ×4 IMPLANT
PAD ARMBOARD 7.5X6 YLW CONV (MISCELLANEOUS) ×8 IMPLANT
PAD ELECT DEFIB RADIOL ZOLL (MISCELLANEOUS) ×4 IMPLANT
PENCIL BUTTON HOLSTER BLD 10FT (ELECTRODE) ×4 IMPLANT
PENCIL SMOKE EVACUATOR (MISCELLANEOUS) ×4 IMPLANT
POSITIONER HEAD DONUT 9IN (MISCELLANEOUS) ×4 IMPLANT
PROBE CRYO2-ABLATION MALLABLE (MISCELLANEOUS) IMPLANT
PUNCH AORTIC ROTATE  4.5MM 8IN (MISCELLANEOUS) ×4 IMPLANT
SET CARDIOPLEGIA MPS 5001102 (MISCELLANEOUS) ×4 IMPLANT
SLEEVE SUCTION 125 (MISCELLANEOUS) ×4 IMPLANT
SPONGE LAP 18X18 RF (DISPOSABLE) ×16 IMPLANT
SURGIFLO W/THROMBIN 8M KIT (HEMOSTASIS) ×4 IMPLANT
SUT BONE WAX W31G (SUTURE) ×4 IMPLANT
SUT PROLENE 3 0 SH1 36 (SUTURE) ×8 IMPLANT
SUT PROLENE 4 0 TF (SUTURE) ×8 IMPLANT
SUT PROLENE 6 0 CC (SUTURE) ×20 IMPLANT
SUT PROLENE 7 0 BV 1 (SUTURE) ×4 IMPLANT
SUT PROLENE 7 0 BV1 MDA (SUTURE) ×4 IMPLANT
SUT PROLENE 8 0 BV175 6 (SUTURE) ×8 IMPLANT
SUT STEEL 6MS V (SUTURE) ×4 IMPLANT
SUT STEEL SZ 6 DBL 3X14 BALL (SUTURE) ×4 IMPLANT
SUT VIC AB 1 CTX 18 (SUTURE) ×8 IMPLANT
SUT VIC AB 2-0 CT1 27 (SUTURE) ×2
SUT VIC AB 2-0 CT1 TAPERPNT 27 (SUTURE) ×2 IMPLANT
SUT VIC AB 3-0 X1 27 (SUTURE) ×4 IMPLANT
SYSTEM SAHARA CHEST DRAIN ATS (WOUND CARE) ×4 IMPLANT
TAPE CLOTH SURG 4X10 WHT LF (GAUZE/BANDAGES/DRESSINGS) ×4 IMPLANT
TAPE PAPER 2X10 WHT MICROPORE (GAUZE/BANDAGES/DRESSINGS) ×4 IMPLANT
TOWEL GREEN STERILE (TOWEL DISPOSABLE) ×4 IMPLANT
TOWEL GREEN STERILE FF (TOWEL DISPOSABLE) ×4 IMPLANT
TRAP FLUID SMOKE EVACUATOR (MISCELLANEOUS) ×4 IMPLANT
TRAY FOLEY SLVR 16FR TEMP STAT (SET/KITS/TRAYS/PACK) ×4 IMPLANT
TUBING LAP HI FLOW INSUFFLATIO (TUBING) ×4 IMPLANT
UNDERPAD 30X30 (UNDERPADS AND DIAPERS) ×4 IMPLANT
WATER STERILE IRR 1000ML POUR (IV SOLUTION) ×8 IMPLANT

## 2019-05-27 NOTE — Procedures (Signed)
Extubation Procedure Note  Patient Details:   Name: NATNAEL EPPINGER DOB: 09-14-1945 MRN: [id number]   Airway Documentation:  Airway 8 mm (Active)  Secured at (cm) 23 cm 05/27/19 1830  Measured From Lips 05/27/19 1830  Secured Location Right 05/27/19 1830  Secured By Pink Tape 05/27/19 1830  Site Condition Dry 05/27/19 1830   Vent end date: 05/27/19 Vent end time: 0740   Evaluation  O2 sats: stable throughout Complications: No apparent complications Patient did tolerate procedure well. Bilateral Breath Sounds: Clear   NIF -40, VC 1.1L with good effort.  Positive for cuff leak.  Patient able to stick out tongue and lift head off pillow.  Patient extubated to 4L nasal cannula without incident, able to vocalize freely.  RN at bedside. Lacretia Nicks 05/27/2019, 7:46 PM

## 2019-05-27 NOTE — Anesthesia Procedure Notes (Signed)
Central Venous Catheter Insertion Performed by: Roderic Palau, MD, anesthesiologist Start/End1/04/2020 6:40 AM, 05/27/2019 7:00 AM Patient location: Pre-op. Preanesthetic checklist: patient identified, IV checked, site marked, risks and benefits discussed, surgical consent, monitors and equipment checked, pre-op evaluation, timeout performed and anesthesia consent Position: Trendelenburg Lidocaine 1% used for infiltration and patient sedated Hand hygiene performed , maximum sterile barriers used  and Seldinger technique used Catheter size: 9 Fr Total catheter length 10. Central line was placed.MAC introducer Procedure performed using ultrasound guided technique. Ultrasound Notes:anatomy identified, needle tip was noted to be adjacent to the nerve/plexus identified, no ultrasound evidence of intravascular and/or intraneural injection and image(s) printed for medical record Attempts: 2 (Attempted RIJ. Unable to pass wire.) Following insertion, line sutured, dressing applied and Biopatch. Post procedure assessment: blood return through all ports, free fluid flow and no air  Patient tolerated the procedure well with no immediate complications.

## 2019-05-27 NOTE — Anesthesia Procedure Notes (Signed)
Central Venous Catheter Insertion Performed by: Roderic Palau, MD, anesthesiologist Start/End1/04/2020 6:40 AM, 05/27/2019 7:00 AM Patient location: Pre-op. Preanesthetic checklist: patient identified, IV checked, site marked, risks and benefits discussed, surgical consent, monitors and equipment checked, pre-op evaluation, timeout performed and anesthesia consent Hand hygiene performed  and maximum sterile barriers used  PA cath was placed.Swan type:thermodilution PA Cath depth:50 Procedure performed without using ultrasound guided technique. Attempts: 1 Patient tolerated the procedure well with no immediate complications.

## 2019-05-27 NOTE — Transfer of Care (Signed)
Immediate Anesthesia Transfer of Care Note  Patient: [name]  Procedure(s) Performed: CORONARY ARTERY BYPASS GRAFTING (CABG) x Three , using left internal mammary artery and right leg greater saphenous vein harvested endoscopically LIMA to LAD, SVG to DIAG, SVG to RCA. Incision of left internal mammary lymph node Incision of Left Internal  Mediastinal Mass (N/A Chest) CLIPPING OF ATRIAL APPENDAGE - Using AtriCure Clip size 45MM (N/A Chest) TRANSESOPHAGEAL ECHOCARDIOGRAM (TEE) (N/A )  Patient Location: SICU  Anesthesia Type:General  Level of Consciousness: Pt sedated  Airway & Oxygen Therapy: Patient remains intubated per anesthesia plan and Patient placed on Ventilator (see vital sign flow sheet for setting)  Post-op Assessment: Report given to RN and Post -op Vital signs reviewed and stable  Post vital signs: Reviewed and stable  Last Vitals:  Vitals Value Taken Time  BP    Temp    Pulse 74 05/27/19 1402  Resp 12 05/27/19 1403  SpO2 83 % 05/27/19 1402  Vitals shown include unvalidated device data.  Last Pain:  Vitals:   05/27/19 0617  TempSrc:   PainSc: 0-No pain      Patients Stated Pain Goal: 4 ([id number])  Complications: No apparent anesthesia complications

## 2019-05-27 NOTE — Plan of Care (Signed)
  Problem: Education: Goal: Will demonstrate proper wound care and an understanding of methods to prevent future damage Outcome: Progressing Goal: Knowledge of disease or condition will improve Outcome: Progressing Goal: Knowledge of the prescribed therapeutic regimen will improve Outcome: Progressing   Problem: Cardiac: Goal: Will achieve and/or maintain hemodynamic stability Outcome: Progressing   Problem: Respiratory: Goal: Respiratory status will improve Outcome: Progressing   Problem: Skin Integrity: Goal: Wound healing without signs and symptoms of infection Outcome: Progressing Goal: Risk for impaired skin integrity will decrease Outcome: Progressing

## 2019-05-27 NOTE — Anesthesia Procedure Notes (Signed)
Arterial Line Insertion Start/End1/04/2020 6:45 AM, 05/27/2019 6:50 AM Performed by: Wilburn Cornelia, CRNA  Patient location: Pre-op. Preanesthetic checklist: patient identified, IV checked, site marked, risks and benefits discussed, surgical consent, monitors and equipment checked, pre-op evaluation, timeout performed and anesthesia consent Lidocaine 1% used for infiltration and patient sedated Left, radial was placed Catheter size: 20 G Hand hygiene performed  and maximum sterile barriers used  Allen's test indicative of satisfactory collateral circulation Attempts: 1 Procedure performed without using ultrasound guided technique. Following insertion, Biopatch and dressing applied. Post procedure assessment: normal  Patient tolerated the procedure well with no immediate complications.

## 2019-05-27 NOTE — Progress Notes (Signed)
CT Surgery PM rounds  Stable on vent- ready for rapid wean HR 54 afib- A-V pacing 80/min, no inotropes Post op labs satisfactory

## 2019-05-27 NOTE — Brief Op Note (Signed)
      [address]       [place] [address]             [phone]    05/27/2019  2:06 PM  PATIENT:  [name]  74 y.o. male  PRE-OPERATIVE DIAGNOSIS:  coronary artery disease atrial fibrillation   POST-OPERATIVE DIAGNOSIS:  coronary artery disease atrial fibrillation and mediastinal mass, question Thymoma   PROCEDURE:  Procedure(s): CORONARY ARTERY BYPASS GRAFTING (CABG) x Three , using left internal mammary artery and right leg greater saphenous vein harvested endoscopically LIMA to LAD, SVG to DIAG, SVG to RCA. Incision of left internal mammary lymph node Incision of Left Internal  Mediastinal Mass (N/A) CLIPPING OF ATRIAL APPENDAGE - Using AtriCure Clip size 45MM (N/A) TRANSESOPHAGEAL ECHOCARDIOGRAM (TEE) (N/A)  LIMA-LAD SVG-RCA SVG-DIAG  SURGEON:  Surgeon(s) and Role:    * Grace Isaac, MD - Primary  PHYSICIAN ASSISTANT: WAYNE GOLD PA-C  ANESTHESIA:   general  EBL:  1100 mL   BLOOD ADMINISTERED:none  DRAINS: LEFT PLEURAL AND MEDIASTINAL CHEST TUBES   LOCAL MEDICATIONS USED:  NONE  SPECIMEN:  Source of Specimen:  MEDIASTINAL MASS AND MAMMARY LYMPH NODE, frozen section - spindle cell mass ? Thymoma  DISPOSITION OF SPECIMEN:  PATHOLOGY  COUNTS:  YES   DICTATION: .Other Dictation: Dictation Number PENDING  PLAN OF CARE: Admit to inpatient   PATIENT DISPOSITION:  ICU - intubated and hemodynamically stable.   Delay start of Pharmacological VTE agent (>24hrs) due to surgical blood loss or risk of bleeding: yes

## 2019-05-27 NOTE — Anesthesia Postprocedure Evaluation (Signed)
Anesthesia Post Note  Patient: [name]  Procedure(s) Performed: CORONARY ARTERY BYPASS GRAFTING (CABG) x Three , using left internal mammary artery and right leg greater saphenous vein harvested endoscopically LIMA to LAD, SVG to DIAG, SVG to RCA. Incision of left internal mammary lymph node Incision of Left Internal  Mediastinal Mass (N/A Chest) CLIPPING OF ATRIAL APPENDAGE - Using AtriCure Clip size 45MM (N/A Chest) TRANSESOPHAGEAL ECHOCARDIOGRAM (TEE) (N/A )     Patient location during evaluation: SICU Anesthesia Type: General Level of consciousness: sedated Pain management: pain level controlled Vital Signs Assessment: post-procedure vital signs reviewed and stable Respiratory status: patient remains intubated per anesthesia plan Cardiovascular status: stable Postop Assessment: no apparent nausea or vomiting Anesthetic complications: no    Last Vitals:  Vitals:   05/27/19 1530 05/27/19 1535  BP:  135/76  Pulse: 80 82  Resp: 12 14  Temp: (!) 35.5 C   SpO2: 98% 100%    Last Pain:  Vitals:   05/27/19 0617  TempSrc:   PainSc: 0-No pain                 Catalina Gravel

## 2019-05-27 NOTE — Anesthesia Procedure Notes (Signed)
Procedure Name: Intubation Date/Time: 05/27/2019 7:41 AM Performed by: Wilburn Cornelia, CRNA Pre-anesthesia Checklist: Emergency Drugs available, Patient identified, Suction available, Patient being monitored and Timeout performed Patient Re-evaluated:Patient Re-evaluated prior to induction Oxygen Delivery Method: Circle system utilized Preoxygenation: Pre-oxygenation with 100% oxygen Induction Type: IV induction Ventilation: Mask ventilation without difficulty Laryngoscope Size: Mac and 4 Grade View: Grade I Tube type: Oral Tube size: 8.0 mm Number of attempts: 1 Airway Equipment and Method: Stylet Placement Confirmation: ETT inserted through vocal cords under direct vision,  positive ETCO2,  CO2 detector and breath sounds checked- equal and bilateral Secured at: 23 cm Tube secured with: Tape Dental Injury: Teeth and Oropharynx as per pre-operative assessment

## 2019-05-27 NOTE — H&P (Signed)
[name] [address]       [place] [address]             [id number]                    [facility] Medical Record [id number] Date of Birth: 11-Dec-1945  Referring: Burtis Junes, NP Primary Care: Leanna Battles, MD Primary Cardiologist: No primary care provider on file.  Chief Complaint:    Chief Complaint  Patient presents with  . Coronary Artery Disease    Further discuss CABG, echocardiogram 12/18   History of Present Illness:    [name] 74 y.o. male is seen in the office for evalution for CABG. He underwent cardiac cath 02/2019 for worsening DOE and chest tightness. He has a history of persistent atrial fibrillation, on chronic anticoagulation with Eliquis, OSA on CPAP, HTN, HLD, obesity, chronic back pain/OA and chronic right middle lung nodule followed by CT.   Patient returned to the office  to further discuss symptoms.  He notes continued shortness of breath with exertion, occasional episodes of substernal discomfort in the morning when he first gets up.  He does do some activities around the yard.  He notes that the recent doubling of his isosorbide has helped some but has not eliminated the symptoms of angina, dyspnea on exertion.  Current Activity/ Functional Status:  Patient is independent with mobility/ambulation, transfers, ADL's, IADL's.   Zubrod Score: At the time of surgery this patient's most appropriate activity status/level should be described as: []     0    Normal activity, no symptoms [x]     1    Restricted in physical strenuous activity but ambulatory, able to do out light work []     2    Ambulatory and capable of self care, unable to do work activities, up and about               >50 % of waking hours                              []     3    Only limited self care, in bed greater than 50% of waking hours []     4    Completely disabled, no self care, confined to bed or chair []     5    Moribund   Past Medical  History:  Diagnosis Date  . A-fib ([place])   . Arrhythmia    hx. Atrial Fib. ,"with regurgitation"  . Depression   . Diabetes mellitus   . Dysrhythmia    a. fib-on Eliquis  . Heart murmur   . Hx of echocardiogram    Echo 3/16:  Mild LVH, EF 60-65%, mild MR, severe LAE, mild RAE, PASP 32 mmHg  . Hypercholesteremia   . Hypertension   . Macular degeneration of both eyes   . OSA on CPAP   . Osteoarthritis (arthritis due to wear and tear of joints)   . Palpitations   . Prostate cancer ([place])    seed implantion- 15 to 20 yrs ago  . Sleep apnea    uses CPAP-settings 5  . Ureter, stricture    hx. 2 yrs ago-stent has been removed now    Past Surgical History:  Procedure Laterality Date  . COLONOSCOPY  2007  . CYSTOSCOPY W/ RETROGRADES  04/04/2012   Procedure: CYSTOSCOPY WITH RETROGRADE PYELOGRAM;  Surgeon: Molli Hazard, MD;  Location: [facility];  Service: Urology;  Laterality: Bilateral;  . CYSTOSCOPY WITH RETROGRADE PYELOGRAM, URETEROSCOPY AND STENT PLACEMENT  04/04/2012   Procedure: CYSTOSCOPY WITH RETROGRADE PYELOGRAM, URETEROSCOPY AND STENT PLACEMENT;  Surgeon: Molli Hazard, MD;  Location: [facility];  Service: Urology;  Laterality: Right;  . JOINT REPLACEMENT Left    knee  . LEFT HEART CATH AND CORONARY ANGIOGRAPHY N/A 03/14/2019   Procedure: LEFT HEART CATH AND CORONARY ANGIOGRAPHY;  Surgeon: Belva Crome, MD;  Location: [facility];  Service: Cardiovascular;  Laterality: N/A;  . LUMBAR LAMINECTOMY/DECOMPRESSION MICRODISCECTOMY N/A 09/27/2017   Procedure: LAMINECTOMY LUMBAR THREE- LUMBAR FOUR, LUMBAR FOUR- LUMBAR FIVE;  Surgeon: Ashok Pall, MD;  Location: [facility];  Service: Neurosurgery;  Laterality: N/A;  . ROTATOR CUFF REPAIR Left   . seed implants    . TOTAL KNEE ARTHROPLASTY Left 08/06/2014   Procedure: LEFT TOTAL KNEE ARTHROPLASTY;  Surgeon: Susa Day, MD;  Location: [facility];  Service: Orthopedics;  Laterality: Left;    Family History  Problem  Relation Age of Onset  . Cancer Mother   . Dementia Mother   . Colon cancer Mother   . Heart attack Sister   . Stroke Sister   . Heart attack Father   . Esophageal cancer Neg Hx   . Rectal cancer Neg Hx   . Stomach cancer Neg Hx      Social History   Tobacco Use  Smoking Status Former Smoker  . Quit date: 05/15/1973  . Years since quitting: 46.0  Smokeless Tobacco Never Used    Social History   Substance and Sexual Activity  Alcohol Use Yes   Comment: "2 cocktails twice a week"     Allergies  Allergen Reactions  . Lisinopril Cough  . Sulfa Drugs Cross Reactors     CHILDHOOD UNSPECIFIED REACTION   . Contrast Media [Iodinated Diagnostic Agents] Rash  . Doxycycline Itching  . Ioxaglate Rash  . Tetracyclines & Related Rash    Current Facility-Administered Medications  Medication Dose Route Frequency Provider Last Rate Last Admin  . cefUROXime (ZINACEF) 1.5 g in sodium chloride 0.9 % 100 mL IVPB  1.5 g Intravenous To OR Grace Isaac, MD      . cefUROXime (ZINACEF) 750 mg in sodium chloride 0.9 % 100 mL IVPB  750 mg Intravenous To OR Grace Isaac, MD      . chlorhexidine (HIBICLENS) 4 % liquid 2 application  30 mL Topical UD Grace Isaac, MD      . dexmedetomidine (PRECEDEX) 400 MCG/100ML (4 mcg/mL) infusion  0.1-0.7 mcg/kg/hr Intravenous To OR Grace Isaac, MD      . EPINEPHrine (ADRENALIN) 4 mg in dextrose 5 % 250 mL (0.016 mg/mL) infusion  0-10 mcg/min Intravenous To OR Grace Isaac, MD      . heparin 2,500 Units, papaverine 30 mg in electrolyte-148 (PLASMALYTE-148) 500 mL irrigation   Irrigation To OR Grace Isaac, MD      . heparin 30,000 units/NS 1000 mL solution for CELLSAVER   Other To OR Grace Isaac, MD      . insulin regular, human (MYXREDLIN) 100 units/ 100 mL infusion   Intravenous To OR Grace Isaac, MD      . magnesium sulfate (IV Push/IM) injection 40 mEq  40 mEq Other To OR Grace Isaac, MD      .  milrinone (PRIMACOR) 20 MG/100 ML (0.2 mg/mL) infusion  0.3  mcg/kg/min Intravenous To OR Grace Isaac, MD      . nitroGLYCERIN 50 mg in dextrose 5 % 250 mL (0.2 mg/mL) infusion  2-200 mcg/min Intravenous To OR Grace Isaac, MD      . norepinephrine (LEVOPHED) 4mg  in 21mL premix infusion  0-40 mcg/min Intravenous To OR Grace Isaac, MD      . phenylephrine (NEOSYNEPHRINE) 20-0.9 MG/250ML-% infusion  30-200 mcg/min Intravenous To OR Grace Isaac, MD      . potassium chloride injection 80 mEq  80 mEq Other To OR Grace Isaac, MD      . tranexamic acid (CYKLOKAPRON) 2,500 mg in sodium chloride 0.9 % 250 mL (10 mg/mL) infusion  1.5 mg/kg/hr Intravenous To OR Grace Isaac, MD      . tranexamic acid (CYKLOKAPRON) bolus via infusion - over 30 minutes 1,567.5 mg  15 mg/kg Intravenous To OR Grace Isaac, MD      . tranexamic acid (CYKLOKAPRON) pump prime solution 209 mg  2 mg/kg Intracatheter To OR Grace Isaac, MD      . vancomycin (VANCOREADY) IVPB 1500 mg/300 mL  1,500 mg Intravenous To OR Grace Isaac, MD       Facility-Administered Medications Ordered in Other Encounters  Medication Dose Route Frequency Provider Last Rate Last Admin  . fentaNYL (SUBLIMAZE) injection   Intravenous Anesthesia Intra-op Wilburn Cornelia, CRNA   50 mcg at 05/27/19 [phone]  . lactated ringers infusion   Intravenous Continuous PRN Wilburn Cornelia, CRNA   New Bag at 05/27/19 [phone]  . lactated ringers infusion   Intravenous Continuous PRN Wilburn Cornelia, CRNA   New Bag at 05/27/19 [phone]  . lactated ringers infusion   Intravenous Continuous PRN Wilburn Cornelia, CRNA   New Bag at 05/27/19 [phone]  . midazolam (VERSED) 5 MG/5ML injection   Intravenous Anesthesia Intra-op Wilburn Cornelia, CRNA   1 mg at 05/27/19 [id number]      Review of Systems:     Cardiac Review of Systems: [Y] = yes  or   [ N ] = no   Chest Pain Blue.Reese    ]  Resting SOB [ n  ] Exertional SOB  Blue.Reese  ]  Orthopnea [ n ]   Pedal  Edema [ n  ]    Palpitations [n  ] Syncope  [ n ]   Presyncope [n   ]   General Review of Systems: [Y] = yes [  ]=no Constitional: recent weight change [  ];  Wt loss over the last 3 months [   ] anorexia [  ]; fatigue [  ]; nausea [  ]; night sweats [  ]; fever [  ]; or chills [  ];           Eye : blurred vision [  ]; diplopia [   ]; vision changes [  ];  Amaurosis fugax[  ]; Resp: cough [  ];  wheezing[  ];  hemoptysis[  ]; shortness of breath[ y ]; paroxysmal nocturnal dyspnea[  ]; dyspnea on exertion[y  ]; or orthopnea[  ];  GI:  gallstones[  ], vomiting[  ];  dysphagia[  ]; melena[  ];  hematochezia [  ]; heartburn[  ];   Hx of  Colonoscopy[  ]; GU: kidney stones [  ]; hematuria[  ];   dysuria [  ];  nocturia[  ];  history of     obstruction [  ];  urinary frequency [  ]             Skin: rash, swelling[  ];, hair loss[  ];  peripheral edema[  ];  or itching[  ]; Musculosketetal: myalgias[  ];  joint swelling[  ];  joint erythema[  ];  joint pain[  ];  back pain[  ];  Heme/Lymph: bruising[y  ];  bleeding[  ];  anemia[  ];  Neuro: TIA[  ];  headaches[  ];  stroke[  ];  vertigo[  ];  seizures[  ];   paresthesias[  ];  difficulty walking[  ];  Psych:depression[  ]; anxiety[  ];  Endocrine: diabetes[  ];  thyroid dysfunction[  ];  Immunizations: Flu up to date [ y ]; Pneumococcal up to date Blue.Reese  ];  Other:     PHYSICAL EXAMINATION: BP 125/74   Pulse 67   Temp 98 F (36.7 C) (Oral)   Resp 17   Ht 6' (1.829 m)   Wt 104.5 kg   SpO2 98%   BMI 31.25 kg/m  General appearance: alert, cooperative and no distress Head: Normocephalic, without obvious abnormality, atraumatic Neck: no adenopathy, no carotid bruit, no JVD, supple, symmetrical, trachea midline and thyroid not enlarged, symmetric, no tenderness/mass/nodules Lymph nodes: Cervical, supraclavicular, and axillary nodes normal. Resp: clear to auscultation bilaterally Cardio: irregularly irregular rhythm GI: soft, non-tender; bowel  sounds normal; no masses,  no organomegaly Extremities: extremities normal, atraumatic, no cyanosis or edema Neurologic: Grossly normal  Diagnostic Studies & Laboratory data:     Recent Radiology Findings:   DG Chest 2 View  Result Date: 05/23/2019 CLINICAL DATA:  CABG.  Shortness of breath. EXAM: CHEST - 2 VIEW COMPARISON:  03/25/2012. FINDINGS: Mediastinum and hilar structures normal. Lungs are clear. No pleural effusion or pneumothorax. Stable cardiomegaly with normal pulmonary vascularity. No acute bony abnormality identified. IMPRESSION: 1.  Stable cardiomegaly with normal pulmonary vascularity. 2.  No acute pulmonary disease. Electronically Signed   By: Marcello Moores  Register   On: 05/23/2019 13:09   ECHOCARDIOGRAM COMPLETE  Result Date: 05/02/2019   ECHOCARDIOGRAM REPORT   Patient Name:   [name] [facility] Date of Exam: 05/02/2019 Medical Rec #:  [id number]    Height:       72.0 in Accession #:    [id number]   Weight:       227.0 lb Date of Birth:  1945-08-05    BSA:          2.25 m Patient Age:    28 years     BP:           114/74 mmHg Patient Gender: M            HR:           93 bpm. Exam Location:  Outpatient Procedure: 2D Echo, Cardiac Doppler and Color Doppler Indications:    CAD Native Vessel 414.01  History:        Patient has prior history of Echocardiogram examinations, most                 recent 07/24/2014. Arrythmias:Atrial Fibrillation,                 Signs/Symptoms:Chest Pain and DOE; Risk Factors:Dyslipidemia and                 Former Smoker.  Sonographer:    Paulita Fujita RDCS Referring Phys: [phone] Abir Eroh B Nicholus Chandran IMPRESSIONS  1. Left ventricular ejection fraction, by visual estimation,  is 50 to 55%. The left ventricle has low normal function. Left ventricular septal wall thickness was mildly increased. Mildly increased left ventricular posterior wall thickness. There is mildly increased left ventricular hypertrophy.  2. Left ventricular diastolic function could not be evaluated.  3. The  left ventricle has no regional wall motion abnormalities.  4. Global right ventricle has normal systolic function.The right ventricular size is normal. No increase in right ventricular wall thickness.  5. Left atrial size was severely dilated.  6. Right atrial size was severely dilated.  7. The mitral valve is normal in structure. Mild mitral valve regurgitation. No evidence of mitral stenosis.  8. The tricuspid valve is normal in structure. Tricuspid valve regurgitation is mild.  9. The aortic valve is tricuspid. Aortic valve regurgitation is trivial. No evidence of aortic valve sclerosis or stenosis. 10. Pulmonic regurgitation is mild. 11. The pulmonic valve was normal in structure. Pulmonic valve regurgitation is mild. 12. Normal pulmonary artery systolic pressure. 13. The inferior vena cava is normal in size with greater than 50% respiratory variability, suggesting right atrial pressure of 3 mmHg. FINDINGS  Left Ventricle: Left ventricular ejection fraction, by visual estimation, is 50 to 55%. The left ventricle has low normal function. The left ventricle has no regional wall motion abnormalities. The left ventricular internal cavity size was the left ventricle is normal in size. Mildly increased left ventricular posterior wall thickness. There is mildly increased left ventricular hypertrophy. The left ventricular diastology could not be evaluated due to atrial fibrillation. Left ventricular diastolic  function could not be evaluated. Normal left atrial pressure. Right Ventricle: The right ventricular size is normal. No increase in right ventricular wall thickness. Global RV systolic function is has normal systolic function. The tricuspid regurgitant velocity is 2.59 m/s, and with an assumed right atrial pressure  of 3 mmHg, the estimated right ventricular systolic pressure is normal at 29.8 mmHg. Left Atrium: Left atrial size was severely dilated. Right Atrium: Right atrial size was severely dilated  Pericardium: There is no evidence of pericardial effusion. Mitral Valve: The mitral valve is normal in structure. Mild mitral valve regurgitation. No evidence of mitral valve stenosis by observation. Tricuspid Valve: The tricuspid valve is normal in structure. Tricuspid valve regurgitation is mild. Aortic Valve: The aortic valve is tricuspid. . There is mild thickening and mild calcification of the aortic valve. Aortic valve regurgitation is trivial. The aortic valve is structurally normal, with no evidence of sclerosis or stenosis. There is mild thickening of the aortic valve. There is mild calcification of the aortic valve. Pulmonic Valve: The pulmonic valve was normal in structure. Pulmonic valve regurgitation is mild. Pulmonic regurgitation is mild. Aorta: The aortic root, ascending aorta and aortic arch are all structurally normal, with no evidence of dilitation or obstruction. Venous: The inferior vena cava is normal in size with greater than 50% respiratory variability, suggesting right atrial pressure of 3 mmHg. IAS/Shunts: No atrial level shunt detected by color flow Doppler. There is no evidence of a patent foramen ovale. No ventricular septal defect is seen or detected. There is no evidence of an atrial septal defect.  LEFT VENTRICLE PLAX 2D LVIDd:         4.30 cm LVIDs:         3.40 cm LV PW:         1.10 cm LV IVS:        1.10 cm LVOT diam:     2.10 cm LV SV:  36 ml LV SV Index:   15.41 LVOT Area:     3.46 cm  RIGHT VENTRICLE RV Basal diam:  3.08 cm RV S prime:     1070.00 cm/s TAPSE (M-mode): 1.5 cm LEFT ATRIUM            Index LA diam:      5.50 cm  2.45 cm/m LA Vol (A2C): 128.0 ml 56.93 ml/m LA Vol (A4C): 115.0 ml 51.15 ml/m  AORTIC VALVE LVOT Vmax:   73.10 cm/s LVOT Vmean:  52.800 cm/s LVOT VTI:    0.155 m MR Peak grad: 65.0 mmHg   TRICUSPID VALVE MR Vmax:      403.00 cm/s TR Peak grad:   26.8 mmHg                           TR Vmax:        259.00 cm/s                            SHUNTS                            Systemic VTI:  0.16 m                           Systemic Diam: 2.10 cm  Skeet Latch MD Electronically signed by Skeet Latch MD Signature Date/Time: 05/02/2019/4:18:03 PM    Final    Doppler Pre CABG  Result Date: 05/25/2019 PREOPERATIVE VASCULAR EVALUATION  Indications:      Pre-CABG. Risk Factors:     Hyperlipidemia, Diabetes, coronary artery disease. Comparison Study: No priors. Performing Technologist: Oda Cogan RDMS, RVT  Examination Guidelines: A complete evaluation includes B-mode imaging, spectral Doppler, color Doppler, and power Doppler as needed of all accessible portions of each vessel. Bilateral testing is considered an integral part of a complete examination. Limited examinations for reoccurring indications may be performed as noted.  Right Carotid Findings: +----------+--------+--------+--------+--------+------------------+           PSV cm/sEDV cm/sStenosisDescribeComments           +----------+--------+--------+--------+--------+------------------+ CCA Prox  80      17                                         +----------+--------+--------+--------+--------+------------------+ CCA Distal60      14                                         +----------+--------+--------+--------+--------+------------------+ ICA Prox  64      21      1-39%           intimal thickening +----------+--------+--------+--------+--------+------------------+ ICA Distal60      26                                         +----------+--------+--------+--------+--------+------------------+ ECA       71      9                                          +----------+--------+--------+--------+--------+------------------+  Portions of this table do not appear on this page. +----------+--------+-------+----------------+------------+           PSV cm/sEDV cmsDescribe        Arm Pressure +----------+--------+-------+----------------+------------+  [id number]            Multiphasic, [id number]          +----------+--------+-------+----------------+------------+ +---------+--------+--+--------+--+---------+ VertebralPSV cm/s39EDV cm/s14Antegrade +---------+--------+--+--------+--+---------+ Left Carotid Findings: +----------+--------+--------+--------+--------+------------------+           PSV cm/sEDV cm/sStenosisDescribeComments           +----------+--------+--------+--------+--------+------------------+ CCA Prox  76      16                                         +----------+--------+--------+--------+--------+------------------+ CCA Distal72      20                                         +----------+--------+--------+--------+--------+------------------+ ICA Prox  58      19      1-39%           intimal thickening +----------+--------+--------+--------+--------+------------------+ ICA Distal55      23                                         +----------+--------+--------+--------+--------+------------------+ ECA       95      10                                         +----------+--------+--------+--------+--------+------------------+ +----------+--------+--------+----------------+------------+ SubclavianPSV cm/sEDV cm/sDescribe        Arm Pressure +----------+--------+--------+----------------+------------+           102             Multiphasic, [id number]          +----------+--------+--------+----------------+------------+ +---------+--------+--+--------+--+---------+ VertebralPSV cm/s24EDV cm/s11Antegrade +---------+--------+--+--------+--+---------+  ABI Findings: +--------+------------------+-----+---------+--------+ Right   Rt Pressure (mmHg)IndexWaveform Comment  +--------+------------------+-----+---------+--------+ [id number]                    triphasic         +--------+------------------+-----+---------+--------+ ATA     161               1.23 triphasic          +--------+------------------+-----+---------+--------+ PTA     153               1.17 triphasic         +--------+------------------+-----+---------+--------+ +--------+------------------+-----+---------+-------+ Left    Lt Pressure (mmHg)IndexWaveform Comment +--------+------------------+-----+---------+-------+ [id number]                    triphasic        +--------+------------------+-----+---------+-------+ ATA     129               0.98 triphasic        +--------+------------------+-----+---------+-------+ PTA     166               1.27 triphasic        +--------+------------------+-----+---------+-------+ +-------+---------------+----------------+ ABI/TBIToday's ABI/TBIPrevious ABI/TBI +-------+---------------+----------------+ Right  1.2                             +-------+---------------+----------------+  Left   1.2                             +-------+---------------+----------------+  Right Doppler Findings: +-----------+--------+-----+---------+--------+ Site       PressureIndexDoppler  Comments +-----------+--------+-----+---------+--------+ Brachial   131          triphasic         +-----------+--------+-----+---------+--------+ Radial                  triphasic         +-----------+--------+-----+---------+--------+ Ulnar                   triphasic         +-----------+--------+-----+---------+--------+ Palmar Arch                      WNL      +-----------+--------+-----+---------+--------+  Left Doppler Findings: +--------+--------+-----+---------+--------+ Site    PressureIndexDoppler  Comments +--------+--------+-----+---------+--------+ [id number]          triphasic         +--------+--------+-----+---------+--------+ Radial               triphasic         +--------+--------+-----+---------+--------+ Ulnar                triphasic         +--------+--------+-----+---------+--------+ Digit                          WNL      +--------+--------+-----+---------+--------+  Summary: Right Carotid: Velocities in the right ICA are consistent with a 1-39% stenosis. Left Carotid: Velocities in the left ICA are consistent with a 1-39% stenosis. Vertebrals:  Bilateral vertebral arteries demonstrate antegrade flow. Subclavians: Normal flow hemodynamics were seen in bilateral subclavian              arteries. Right ABI: Resting right ankle-brachial index is within normal range. No evidence of significant right lower extremity arterial disease. Left ABI: Resting left ankle-brachial index is within normal range. No evidence of significant left lower extremity arterial disease. Bilateral Extremity: Doppler waveforms remain within normal limits with compression bilaterally for the radial arteries. Doppler waveforms remain within normal limits with compression bilaterally for the ulnar arteries.  Electronically signed by Harold Barban MD on 05/25/2019 at 10:47:28 AM.    Final    Left Atrium 5.5 cm    Recent Lab Findings: Lab Results  Component Value Date   WBC 5.2 05/23/2019   HGB 14.8 05/23/2019   HCT 43.4 05/23/2019   PLT  05/23/2019    PLATELET CLUMPS NOTED ON SMEAR, COUNT APPEARS DECREASED   GLUCOSE 101 (H) 05/23/2019   CHOL 137 10/28/2018   TRIG 215 (H) 10/28/2018   HDL 33 (L) 10/28/2018   LDLCALC 61 10/28/2018   ALT 29 05/23/2019   AST 32 05/23/2019   NA 135 05/23/2019   K 4.1 05/23/2019   CL 105 05/23/2019   CREATININE 1.42 (H) 05/23/2019   BUN 28 (H) 05/23/2019   CO2 19 (L) 05/23/2019   INR 1.1 05/23/2019   HGBA1C 5.5 05/23/2019   EXAM: CT FFR ANALYSIS  CLINICAL DATA:  74 year old male with chest pain and abnormal coronary CTA.  FINDINGS: FFRct analysis was performed on the original cardiac CT angiogram dataset. Diagrammatic representation of the FFRct analysis is provided in a separate PDF document in PACS. This dictation was created using  the PDF document and an  interactive 3D model of the results. 3D model is not available in the EMR/PACS. Normal FFR range is >0.80.  1. Left Main: 0.98.  2. Proximal LAD: 0.86, mid LAD: 0.78, distal LAD: 0.74. 3. LCX: 0.55, however very small lumen. 4. RCA: Proximal: 0.97, mid: 0.84, distal: 0.83. 5. PDA: Occluded in the proximal portion. 6. PLA: 0.5.  IMPRESSION: 1. CT FFR analysis showed severe stenosis in the mid LAD, small PLA and occluded proximal portion of PDA. A cardiac catheterization is recommended. 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   Electronically Signed   By: Ena Dawley   On: 03/04/2019 19:08  ADDENDUM REPORT: 03/03/2019 12:54  CLINICAL DATA:  74 year old male with h/o DM, hypertension, HLP and chest pressure.  EXAM: Cardiac/Coronary  CTA  TECHNIQUE: The patient was scanned on a [employer].  FINDINGS: A 100 kV prospective scan was triggered in the descending thoracic aorta at 111 HU's. Axial non-contrast 3 mm slices were carried out through the heart. The data set was analyzed on a dedicated work station and scored using the [place]. Gantry rotation speed was 250 msecs and collimation was .6 mm. No beta blockade and 0.8 mg of sl NTG was given. The 3D data set was reconstructed in 5% intervals of the 67-82 % of the R-R cycle. Diastolic phases were analyzed on a dedicated work station using MPR, MIP and VRT modes. The patient received 80 cc of contrast.  Aorta: Normal size. Mild atherosclerotic plaque and calcifications. No dissection.  Aortic Valve: Trileaflet. Mild leaflet thickening and calcifications.  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 Right dominance.  RCA is a large dominant artery that gives rise to a small non-dominant anomalous LCX artery with posterior course, PDA and PLA. There is minimal calcified plaque in  the proximal portion with stenosis 0-24%. There is severe non-calcified plaque in the mid RCA with stenosis > 70%. Distal RCA/PDA/PLA is affected by motion and poor vasodilation, however there appears to be at least moderate diffuse calcified plaque and stenosis 50-69%.  LAD is a large vessel that originated in between left and non-coronary sinus. It gives rise to one large diagonal artery. There is moderate non-calcified plaque in the proximal portion with stenosis 50-69%. This is followed by a diffuse moderate predominantly calcified plaque with stenosis at least 50-69% in the mid portion. Mid to distal LAD has mild diffuse plaque with stenosis 25-49%.  D1 is a medium size artery with mild diffuse plaque.  LCX is a very small non-dominant artery that originates in the proximal RCA, runs posteriorly between aorta and left atrium and gives rise to a very small OM branch. There is at least moderate plaque, however vessel lumen too small for intervention.  Other findings:  Normal pulmonary vein drainage into the left atrium.  IMPRESSION: 1. Coronary calcium score of 1242. This was 79 percentile for age and sex matched control.  2. 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 Right dominance.  3. Study quality is affected by poor vasodilation, diffuse calcifications and motion associated with atrial fibrillation. However, there is severe stenosis in the mid RCA and possibly in the distal RCA and mid LAD. Additional analysis with CT FFR will be submitted. CAD-RADS 4.  4. Mildly dilated pulmonary artery measuring 31 mm.  5. There is a filling defect in the most antero-superior portion of  the left atrial appendage, this might represent a poor contrast mixing on first pass imaging vs a thrombus.   Electronically Signed   By: Ena Dawley   On: 03/03/2019 12:54   Addended by Dorothy Spark, MD on 03/03/2019 12:57  PM    Study Result  EXAM: OVER-READ INTERPRETATION  CT CHEST  The following report is an over-read performed by radiologist Dr. Rolm Baptise of [facility], [place] on 03/03/2019. This over-read does not include interpretation of cardiac or coronary anatomy or pathology. The coronary CTA interpretation by the cardiologist is attached.  COMPARISON:  11/07/2017  FINDINGS: Vascular: Heart is normal size. Visualized aorta normal caliber. Scattered calcifications in the visualized descending thoracic aorta.  Mediastinum/Nodes: Partially imaged on the 1st image is a mildly prominent prevascular lymph node with a short axis diameter of 12 mm. This is stable since prior study. No additional prominent visualized mediastinal or hilar lymph nodes.  Lungs/Pleura: No confluent opacity or effusion.  Upper Abdomen: Suspect fatty infiltration of the liver. No acute findings.  Musculoskeletal: Chest wall soft tissues are unremarkable. No acute bony abnormality.  IMPRESSION: No acute extra cardiac abnormality.  Borderline sized prevascular lymph node, stable since prior study.  Fatty infiltration of the liver.  Electronically Signed: By: Rolm Baptise M.D. On: 03/03/2019 10:13       CATh 03/14/2019  Moderately severe three-vessel coronary disease.  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  2 small obtuse marginal branch is supplied on the left lateral wall.  40% proximal LAD with 60 to 70% diffuse mid to distal LAD.  First diagonal contains eccentric 50 to 70% narrowing.  Right coronary is diffusely diseased with mid 60 to 65% stenosis.  Distal 75% stenosis before small PDA.  99% stenosis in the continuation of the right coronary before the second PLA.  The acute marginal branch of the right coronary has multifocal 95% stenoses.  Low normal LVEF approximately 50% with normal EDP.  AO Systolic Pressure [id number]  mmHg  AO Diastolic Pressure 70 mmHg  AO Mean 96 mmHg  LV Systolic Pressure 0000000 mmHg  LV Diastolic Pressure 3 mmHg  LV EDP 7 mmHg  AOp Systolic Pressure [id number] mmHg  AOp Diastolic Pressure 70 mmHg  AOp Mean Pressure 96 mmHg  LVp Systolic Pressure [id number] mmHg  LVp Diastolic Pressure 3 mmHg  LVp EDP Pressure 9 mmHg   RECOMMENDATIONS:   Aggressive risk preventive therapy with LDL target less than 55.  Add long-acting nitrate therapy to improve exertional dyspnea and chest tightness.  If unacceptable symptoms, would consider multivessel coronary bypass grafting.  In absence of high-grade proximal CAD, I feel a measured approach attempting to control symptoms with medications is a reasonable first option. Cath per Dr Tamala Julian     Assessment / Plan:   1/ Coronary Artery disease with anginal symptoms in spite of aggressive medical rx. He notes some improvement symptoms with doubling his dose of  Imdur to 60 mg day, but still limited by chest discomfort and sob.  Anomalous circumflex coronary noted , distal vessel my not be bypassable.  2/ Chronic atrial fib on elequis  With severely dilated left atrium 5.5 cm may be contributing to sob with exertion After this visit current echo arranged and results reported. Cardiac CT suggested question of thrombosis in laa- but subsequent  Review by Dr Meda Coffee likely first pass mixing without evidence of clot  3/ Followed for ground glass opacity right middle  lobe. - not mentioned on recent cardiac ct -had radiologist review    Updated xray report -"ADDENDUM REPORT: 05/05/2019 13:02  ADDENDUM: Comparison is again made to the study and report from 11/07/2017. The vague ground-glass nodule in the right middle lobe seen on prior study is stable, measuring 6 mm. Small peripheral left lower lobe nodule seen on prior study is again noted, measuring 5 mm in diameter, surrounded on this study with dependent atelectasis. No new or enlarging pulmonary  nodules.  Recommend continued follow-up as recommended on prior study."    I again discussed with the patient is continued anginal symptoms with complex coronary disease as noted in Dr. Thompson Caul note.  With the patient's continued symptomatology in spite of doubling his dose of Imadur I discussed with him proceeding with coronary artery bypass grafting.  Risks and options of bypass surgery were discussed with him in detail.  He notes now that he is limited enough that he is willing to proceed with coronary bypass grafting.  We also discussed possible maze and at least atrial clipping.  The goals risks and alternatives of the planned surgical procedure Procedure(s): CORONARY ARTERY BYPASS GRAFTING (CABG) (N/A) possible MAZE (N/A) CLIPPING OF ATRIAL APPENDAGE (N/A) TRANSESOPHAGEAL ECHOCARDIOGRAM (TEE) (N/A)  have been discussed with the patient in detail. The risks of the procedure including death, infection, stroke, myocardial infarction, bleeding, blood transfusion have all been discussed specifically.  I have quoted [name] a 2 % of perioperative mortality and a complication rate as high as 40 %. The patient's questions have been answered.[name] is willing  to proceed with the planned procedure.  Grace Isaac MD      [address] [place] [address] Office [phone]     05/27/2019 7:21 AM

## 2019-05-27 NOTE — Progress Notes (Signed)
  Echocardiogram Echocardiogram Transesophageal has been performed.  [name] 05/27/2019, 8:25 AM

## 2019-05-28 ENCOUNTER — Inpatient Hospital Stay (HOSPITAL_COMMUNITY): Payer: Medicare Other

## 2019-05-28 ENCOUNTER — Ambulatory Visit: Payer: Medicare Other | Admitting: Nurse Practitioner

## 2019-05-28 ENCOUNTER — Encounter: Payer: Self-pay | Admitting: *Deleted

## 2019-05-28 LAB — GLUCOSE, CAPILLARY
Glucose-Capillary: 108 mg/dL — ABNORMAL HIGH (ref 70–99)
Glucose-Capillary: 116 mg/dL — ABNORMAL HIGH (ref 70–99)
Glucose-Capillary: 120 mg/dL — ABNORMAL HIGH (ref 70–99)
Glucose-Capillary: 120 mg/dL — ABNORMAL HIGH (ref 70–99)
Glucose-Capillary: 122 mg/dL — ABNORMAL HIGH (ref 70–99)
Glucose-Capillary: 124 mg/dL — ABNORMAL HIGH (ref 70–99)
Glucose-Capillary: 128 mg/dL — ABNORMAL HIGH (ref 70–99)
Glucose-Capillary: 131 mg/dL — ABNORMAL HIGH (ref 70–99)
Glucose-Capillary: 134 mg/dL — ABNORMAL HIGH (ref 70–99)
Glucose-Capillary: 140 mg/dL — ABNORMAL HIGH (ref 70–99)
Glucose-Capillary: 147 mg/dL — ABNORMAL HIGH (ref 70–99)

## 2019-05-28 LAB — BASIC METABOLIC PANEL
Anion gap: 8 (ref 5–15)
Anion gap: 9 (ref 5–15)
BUN: 17 mg/dL (ref 8–23)
BUN: 16 mg/dL (ref 8–23)
CO2: 21 mmol/L — ABNORMAL LOW (ref 22–32)
CO2: 23 mmol/L (ref 22–32)
Calcium: 7.8 mg/dL — ABNORMAL LOW (ref 8.9–10.3)
Calcium: 8.3 mg/dL — ABNORMAL LOW (ref 8.9–10.3)
Chloride: 106 mmol/L (ref 98–111)
Chloride: 102 mmol/L (ref 98–111)
Creatinine, Ser: 1.09 mg/dL (ref 0.61–1.24)
Creatinine, Ser: 1.27 mg/dL — ABNORMAL HIGH (ref 0.61–1.24)
GFR calc Af Amer: >60 mL/min (ref >60)
GFR calc Af Amer: >60 mL/min (ref >60)
GFR calc non Af Amer: >60 mL/min (ref >60)
GFR calc non Af Amer: 55 mL/min — ABNORMAL LOW (ref >60)
Glucose, Bld: 119 mg/dL — ABNORMAL HIGH (ref 70–99)
Glucose, Bld: 147 mg/dL — ABNORMAL HIGH (ref 70–99)
Potassium: 3.6 mmol/L (ref 3.5–5.1)
Potassium: 3.7 mmol/L (ref 3.5–5.1)
Sodium: 135 mmol/L (ref 135–145)
Sodium: 134 mmol/L — ABNORMAL LOW (ref 135–145)

## 2019-05-28 LAB — CBC
HCT: 32.7 % — ABNORMAL LOW (ref 39.0–52.0)
HCT: 34.6 % — ABNORMAL LOW (ref 39.0–52.0)
Hemoglobin: 11.2 g/dL — ABNORMAL LOW (ref 13.0–17.0)
Hemoglobin: 11.8 g/dL — ABNORMAL LOW (ref 13.0–17.0)
MCH: 31.9 pg (ref 26.0–34.0)
MCH: 32.3 pg (ref 26.0–34.0)
MCHC: 34.3 g/dL (ref 30.0–36.0)
MCHC: 34.1 g/dL (ref 30.0–36.0)
MCV: 93.2 fL (ref 80.0–100.0)
MCV: 94.8 fL (ref 80.0–100.0)
Platelets: 81 10*3/uL — ABNORMAL LOW (ref 150–400)
Platelets: 79 10*3/uL — ABNORMAL LOW (ref 150–400)
RBC: 3.51 MIL/uL — ABNORMAL LOW (ref 4.22–5.81)
RBC: 3.65 MIL/uL — ABNORMAL LOW (ref 4.22–5.81)
RDW: 12.8 % (ref 11.5–15.5)
RDW: 13 % (ref 11.5–15.5)
WBC: 7.5 10*3/uL (ref 4.0–10.5)
WBC: 10 10*3/uL (ref 4.0–10.5)
nRBC: 0 % (ref 0.0–0.2)
nRBC: 0 % (ref 0.0–0.2)

## 2019-05-28 LAB — MAGNESIUM
Magnesium: 2.3 mg/dL (ref 1.7–2.4)
Magnesium: 2.4 mg/dL (ref 1.7–2.4)

## 2019-05-28 MED ORDER — INSULIN ASPART 100 UNIT/ML ~~LOC~~ SOLN
0.0000 [IU] | SUBCUTANEOUS | Status: DC
  Administered 2019-05-28 – 2019-05-29 (×5): 2 [IU] via SUBCUTANEOUS

## 2019-05-28 MED ORDER — POTASSIUM CHLORIDE 10 MEQ/50ML IV SOLN
10.0000 meq | INTRAVENOUS | Status: AC
  Administered 2019-05-28 (×3): 10 meq via INTRAVENOUS
  Filled 2019-05-28 (×3): qty 50

## 2019-05-28 MED ORDER — TRAMADOL HCL 50 MG PO TABS
50.0000 mg | ORAL_TABLET | Freq: Four times a day (QID) | ORAL | Status: DC | PRN
  Administered 2019-05-28 – 2019-05-31 (×6): 50 mg via ORAL
  Filled 2019-05-28 (×7): qty 1

## 2019-05-28 NOTE — Addendum Note (Signed)
Addendum  created 05/28/19 1031 by Josephine Igo, CRNA   Order list changed

## 2019-05-28 NOTE — Progress Notes (Signed)
      [address]       [place] [address]             [phone]      POD # 1 CABG, LA clip  Resting comfortably  BP 103/69   Pulse 84   Temp 98.2 F (36.8 C) (Oral)   Resp (!) 23   Ht 6' (1.829 m)   Wt 109.4 kg   SpO2 94%   BMI 32.71 kg/m   Intake/Output Summary (Last 24 hours) at 05/28/2019 1836 Last data filed at 05/28/2019 1200 Gross per 24 hour  Intake 2998.03 ml  Output 1640 ml  Net 1358.03 ml   On room air CBG well controlled PM labs pending  Remo Lipps C. Roxan Hockey, MD [facility] [phone]

## 2019-05-28 NOTE — Progress Notes (Addendum)
[facility] DAILY ICU PROGRESS NOTE                   [address]            [place] [address]          [phone]   1 Day Post-Op Procedure(s) (LRB): CORONARY ARTERY BYPASS GRAFTING (CABG) x Three , using left internal mammary artery and right leg greater saphenous vein harvested endoscopically LIMA to LAD, SVG to DIAG, SVG to RCA. Incision of left internal mammary lymph node Incision of Left Internal  Mediastinal Mass (N/A) CLIPPING OF ATRIAL APPENDAGE - Using AtriCure Clip size 45MM (N/A) TRANSESOPHAGEAL ECHOCARDIOGRAM (TEE) (N/A)  Total Length of Stay:  LOS: 1 day   Subjective: Feels pretty well overall  Objective: Vital signs in last 24 hours: Temp:  [95.5 F (35.3 C)-99.3 F (37.4 C)] 98.8 F (37.1 C) (01/13 0700) Pulse Rate:  [77-100] 84 (01/13 0700) Cardiac Rhythm: A-V Sequential paced (01/13 0000) Resp:  [12-26] 15 (01/13 0700) BP: (99-135)/(57-94) 122/74 (01/13 0700) SpO2:  [95 %-100 %] 97 % (01/13 0700) Arterial Line BP: (101-151)/(51-92) 111/61 (01/13 0700) FiO2 (%):  [40 %-50 %] 40 % (01/12 1830) Weight:  [104.5 kg-109.4 kg] 109.4 kg (01/13 0630)  Filed Weights   05/27/19 0617 05/27/19 1416 05/28/19 0630  Weight: 104.5 kg 104.5 kg 109.4 kg    Weight change: 0 kg   Hemodynamic parameters for last 24 hours: PAP: (22-46)/(6-27) 28/17 CVP:  [2 mmHg-19 mmHg] 8 mmHg CO:  [3 L/min-6.4 L/min] 5.9 L/min CI:  [1.3 L/min/m2-2.8 L/min/m2] 2.6 L/min/m2  Intake/Output from previous day: 01/12 0701 - 01/13 0700 In: 6792.2 [I.V.:2589.8; Blood:626; IV Piggyback:3576.4] Out: 2119 [ERDEY:8144; Blood:1100; Chest Tube:486]  Intake/Output this shift: No intake/output data recorded.  Current Meds: Scheduled Meds: . acetaminophen  1,000 mg Oral Q6H   Or  . acetaminophen (TYLENOL) oral liquid 160 mg/5 mL  1,000 mg Per Tube Q6H  . aspirin EC  325 mg Oral Daily   Or  . aspirin  324 mg Per Tube Daily  . atorvastatin  10 mg Oral QHS  . bisacodyl  10 mg Oral  Daily   Or  . bisacodyl  10 mg Rectal Daily  . chlorhexidine gluconate (MEDLINE KIT)  15 mL Mouth Rinse BID  . Chlorhexidine Gluconate Cloth  6 each Topical Daily  . docusate sodium  200 mg Oral Daily  . metoprolol tartrate  12.5 mg Oral BID   Or  . metoprolol tartrate  12.5 mg Per Tube BID  . [START ON 05/29/2019] pantoprazole  40 mg Oral Daily  . sodium chloride flush  3 mL Intravenous Q12H   Continuous Infusions: . sodium chloride 10 mL/hr at 05/27/19 1900  . sodium chloride    . sodium chloride 10 mL/hr at 05/27/19 1501  . cefUROXime (ZINACEF)  IV 1.5 g (05/27/19 1955)  . dexmedetomidine (PRECEDEX) IV infusion 0.1 mcg/kg/hr (05/27/19 1900)  . famotidine (PEPCID) IV Stopped (05/27/19 1449)  . insulin 1.7 mL/hr at 05/27/19 1900  . lactated ringers    . lactated ringers 20 mL/hr at 05/27/19 1900  . nitroGLYCERIN    . phenylephrine (NEO-SYNEPHRINE) Adult infusion Stopped (05/27/19 1510)   PRN Meds:.sodium chloride, dextrose, metoprolol tartrate, midazolam, morphine injection, ondansetron (ZOFRAN) IV, oxyCODONE, sodium chloride flush, traMADol  General appearance: alert, cooperative and no distress Heart: regular rate and rhythm Lungs: clear to auscultation bilaterally Abdomen: benign Extremities: trace edema Wound: dressings CDI  Lab Results: CBC: Recent  Labs    05/27/19 1927 05/27/19 2025 05/28/19 0408  WBC 7.0  --  7.5  HGB 11.6* 9.9* 11.2*  HCT 33.7* 29.0* 32.7*  PLT 70*  --  81*   BMET:  Recent Labs    05/27/19 1927 05/27/19 2025 05/28/19 0408  NA 135 141 135  K 4.2 3.8 3.6  CL 107  --  106  CO2 22  --  21*  GLUCOSE 140*  --  119*  BUN 18  --  17  CREATININE 1.19  --  1.09  CALCIUM 7.6*  --  7.8*    CMET: Lab Results  Component Value Date   WBC 7.5 05/28/2019   HGB 11.2 (L) 05/28/2019   HCT 32.7 (L) 05/28/2019   PLT 81 (L) 05/28/2019   GLUCOSE 119 (H) 05/28/2019   CHOL 137 10/28/2018   TRIG 215 (H) 10/28/2018   HDL 33 (L) 10/28/2018   LDLCALC  61 10/28/2018   ALT 29 05/23/2019   AST 32 05/23/2019   NA 135 05/28/2019   K 3.6 05/28/2019   CL 106 05/28/2019   CREATININE 1.09 05/28/2019   BUN 17 05/28/2019   CO2 21 (L) 05/28/2019   INR 1.6 (H) 05/27/2019   HGBA1C 5.5 05/23/2019      PT/INR:  Recent Labs    05/27/19 1353  LABPROT 18.5*  INR 1.6*   Radiology: [facility] Chest Port 1 View  Result Date: 05/27/2019 CLINICAL DATA:  Status post coronary bypass graft. EXAM: PORTABLE CHEST 1 VIEW COMPARISON:  May 23, 2019. FINDINGS: Left-sided chest tube is noted without pneumothorax. Endotracheal and nasogastric tubes are unchanged in position. Left internal jugular Swan-Ganz catheter is noted with tip directed toward right pulmonary artery. Right lung is clear. Mild left basilar subsegmental atelectasis is noted. Bony thorax is unremarkable. IMPRESSION: Left-sided chest tube is unchanged without pneumothorax. Mild left basilar subsegmental atelectasis is noted. Left internal jugular Swan-Ganz catheter tip is directed toward right pulmonary artery. Electronically Signed   By: Marijo Conception M.D.   On: 05/27/2019 14:41     Assessment/Plan: S/P Procedure(s) (LRB): CORONARY ARTERY BYPASS GRAFTING (CABG) x Three , using left internal mammary artery and right leg greater saphenous vein harvested endoscopically LIMA to LAD, SVG to DIAG, SVG to RCA. Incision of left internal mammary lymph node Incision of Left Internal  Mediastinal Mass (N/A) CLIPPING OF ATRIAL APPENDAGE - Using AtriCure Clip size 45MM (N/A) TRANSESOPHAGEAL ECHOCARDIOGRAM (TEE) (N/A)  1 doing well POD#1 2 hemodyn stable in afib(chronic), excellent CI/CI- no inotropes/gtts- d/c aline and swan 3 sats good on 4 liters, routine pulm toilet/IS 4 CXR clear lung fields 5 CT drainage 350 overnight- will d/c MT's 6 mild Acute blood loss anemia- monitor clinically 7 good UOP- normal renal fxn- need a fair amount of volume initially post op- hold on diuresis for now as doesn't  appear significantly volume overloaded 8 reactive thrombocytopenia- monitor, may need to hold on lovenox for now 9 BS- transition to SSI, resume metformin in next day or so 10 routine advancement of activity per protocols 11 see progression orders    Sean Giovanni PA-C 05/28/2019 7:23 AM  Pager [phone]  preop plts borderline low, avoid heparin for now Discussed mediastinal mass resection with patient, post op chest xray no evidence of damage to phrenic  Now back in afib I have seen and examined [name] and agree with the above assessment  and plan.  Grace Isaac MD Beeper [phone] Office [phone]  05/28/2019 8:22 AM

## 2019-05-29 ENCOUNTER — Inpatient Hospital Stay (HOSPITAL_COMMUNITY): Payer: Medicare Other

## 2019-05-29 ENCOUNTER — Encounter (HOSPITAL_COMMUNITY): Payer: Self-pay | Admitting: Cardiothoracic Surgery

## 2019-05-29 DIAGNOSIS — C37 Malignant neoplasm of thymus: Secondary | ICD-10-CM | POA: Diagnosis present

## 2019-05-29 HISTORY — DX: Malignant neoplasm of thymus: C37

## 2019-05-29 LAB — GLUCOSE, CAPILLARY
Glucose-Capillary: 113 mg/dL — ABNORMAL HIGH (ref 70–99)
Glucose-Capillary: 116 mg/dL — ABNORMAL HIGH (ref 70–99)
Glucose-Capillary: 117 mg/dL — ABNORMAL HIGH (ref 70–99)
Glucose-Capillary: 118 mg/dL — ABNORMAL HIGH (ref 70–99)
Glucose-Capillary: 121 mg/dL — ABNORMAL HIGH (ref 70–99)
Glucose-Capillary: 122 mg/dL — ABNORMAL HIGH (ref 70–99)
Glucose-Capillary: 131 mg/dL — ABNORMAL HIGH (ref 70–99)

## 2019-05-29 LAB — BASIC METABOLIC PANEL
Anion gap: 8 (ref 5–15)
BUN: 17 mg/dL (ref 8–23)
CO2: 24 mmol/L (ref 22–32)
Calcium: 8.2 mg/dL — ABNORMAL LOW (ref 8.9–10.3)
Chloride: 103 mmol/L (ref 98–111)
Creatinine, Ser: 1.11 mg/dL (ref 0.61–1.24)
GFR calc Af Amer: >60 mL/min (ref >60)
GFR calc non Af Amer: >60 mL/min (ref >60)
Glucose, Bld: 129 mg/dL — ABNORMAL HIGH (ref 70–99)
Potassium: 4.2 mmol/L (ref 3.5–5.1)
Sodium: 135 mmol/L (ref 135–145)

## 2019-05-29 MED ORDER — FUROSEMIDE 40 MG PO TABS
40.0000 mg | ORAL_TABLET | Freq: Every day | ORAL | Status: DC
  Administered 2019-05-29: 40 mg via ORAL
  Filled 2019-05-29: qty 1

## 2019-05-29 MED ORDER — BISACODYL 10 MG RE SUPP: 10.0000 mg | Freq: Every day | RECTAL | Status: DC | PRN

## 2019-05-29 MED ORDER — PANTOPRAZOLE SODIUM 40 MG PO TBEC
40.0000 mg | DELAYED_RELEASE_TABLET | Freq: Every day | ORAL | Status: DC
  Administered 2019-05-29 – 2019-06-01 (×4): 40 mg via ORAL
  Filled 2019-05-29 (×4): qty 1

## 2019-05-29 MED ORDER — ADULT MULTIVITAMIN W/MINERALS CH
1.0000 | ORAL_TABLET | Freq: Every day | ORAL | Status: DC
  Administered 2019-05-29 – 2019-05-30 (×2): 1 via ORAL
  Filled 2019-05-29 (×2): qty 1

## 2019-05-29 MED ORDER — MAGNESIUM HYDROXIDE 400 MG/5ML PO SUSP
30.0000 mL | Freq: Every day | ORAL | Status: DC | PRN
  Administered 2019-05-29: 30 mL via ORAL
  Filled 2019-05-29: qty 30

## 2019-05-29 MED ORDER — OXYCODONE HCL 5 MG PO TABS
5.0000 mg | ORAL_TABLET | ORAL | Status: DC | PRN
  Administered 2019-05-29 – 2019-06-01 (×8): 10 mg via ORAL
  Filled 2019-05-29 (×8): qty 2

## 2019-05-29 MED ORDER — SODIUM CHLORIDE 0.9% FLUSH: 3.0000 mL | INTRAVENOUS | Status: DC | PRN

## 2019-05-29 MED ORDER — ATORVASTATIN CALCIUM 40 MG PO TABS
40.0000 mg | ORAL_TABLET | Freq: Every day | ORAL | Status: DC
  Administered 2019-05-29 – 2019-05-31 (×3): 40 mg via ORAL
  Filled 2019-05-29 (×3): qty 1

## 2019-05-29 MED ORDER — DOCUSATE SODIUM 100 MG PO CAPS
200.0000 mg | ORAL_CAPSULE | Freq: Every day | ORAL | Status: DC
  Administered 2019-05-29 – 2019-06-01 (×4): 200 mg via ORAL
  Filled 2019-05-29 (×4): qty 2

## 2019-05-29 MED ORDER — PRESERVISION AREDS 2 PO CAPS: 1.0000 | ORAL_CAPSULE | Freq: Two times a day (BID) | ORAL | Status: DC

## 2019-05-29 MED ORDER — METOPROLOL TARTRATE 12.5 MG HALF TABLET
12.5000 mg | ORAL_TABLET | Freq: Two times a day (BID) | ORAL | Status: DC
  Administered 2019-05-29 (×2): 12.5 mg via ORAL
  Filled 2019-05-29 (×2): qty 1

## 2019-05-29 MED ORDER — ONDANSETRON HCL 4 MG PO TABS: 4.0000 mg | ORAL_TABLET | Freq: Four times a day (QID) | ORAL | Status: DC | PRN

## 2019-05-29 MED ORDER — ~~LOC~~ CARDIAC SURGERY, PATIENT & FAMILY EDUCATION: Freq: Once | Status: AC

## 2019-05-29 MED ORDER — METFORMIN HCL 500 MG PO TABS
500.0000 mg | ORAL_TABLET | Freq: Every day | ORAL | Status: DC
  Administered 2019-05-29 – 2019-06-01 (×4): 500 mg via ORAL
  Filled 2019-05-29 (×4): qty 1

## 2019-05-29 MED ORDER — SODIUM CHLORIDE 0.9% FLUSH
3.0000 mL | Freq: Two times a day (BID) | INTRAVENOUS | Status: DC
  Administered 2019-05-30 – 2019-06-01 (×3): 3 mL via INTRAVENOUS

## 2019-05-29 MED ORDER — ONDANSETRON HCL 4 MG/2ML IJ SOLN
4.0000 mg | Freq: Four times a day (QID) | INTRAMUSCULAR | Status: DC | PRN
  Administered 2019-05-29 (×2): 4 mg via INTRAVENOUS
  Filled 2019-05-29 (×2): qty 2

## 2019-05-29 MED ORDER — INSULIN ASPART 100 UNIT/ML ~~LOC~~ SOLN: 0.0000 [IU] | Freq: Three times a day (TID) | SUBCUTANEOUS | Status: DC

## 2019-05-29 MED ORDER — BISACODYL 5 MG PO TBEC
10.0000 mg | DELAYED_RELEASE_TABLET | Freq: Every day | ORAL | Status: DC | PRN
  Administered 2019-05-29: 10 mg via ORAL
  Filled 2019-05-29: qty 2

## 2019-05-29 MED ORDER — ASPIRIN EC 325 MG PO TBEC
325.0000 mg | DELAYED_RELEASE_TABLET | Freq: Every day | ORAL | Status: DC
  Administered 2019-05-29 – 2019-05-30 (×2): 325 mg via ORAL
  Filled 2019-05-29 (×2): qty 1

## 2019-05-29 MED ORDER — ALUM & MAG HYDROXIDE-SIMETH 200-200-20 MG/5ML PO SUSP: 15.0000 mL | ORAL | Status: DC | PRN

## 2019-05-29 MED ORDER — SODIUM CHLORIDE 0.9 % IV SOLN: 250.0000 mL | INTRAVENOUS | Status: DC | PRN

## 2019-05-29 MED ORDER — ACETAMINOPHEN 325 MG PO TABS
650.0000 mg | ORAL_TABLET | Freq: Four times a day (QID) | ORAL | Status: DC | PRN
  Administered 2019-05-30: 650 mg via ORAL
  Filled 2019-05-29: qty 2

## 2019-05-29 MED ORDER — CHLORHEXIDINE GLUCONATE CLOTH 2 % EX PADS
6.0000 | MEDICATED_PAD | Freq: Every day | CUTANEOUS | Status: DC
  Administered 2019-05-29: 6 via TOPICAL

## 2019-05-29 MED ORDER — POTASSIUM CHLORIDE CRYS ER 20 MEQ PO TBCR
20.0000 meq | EXTENDED_RELEASE_TABLET | Freq: Every day | ORAL | Status: DC
  Administered 2019-05-29 – 2019-05-31 (×3): 20 meq via ORAL
  Filled 2019-05-29 (×3): qty 1

## 2019-05-29 NOTE — Progress Notes (Addendum)
    [address]       [place] [address]             [phone]      2 Days Post-Op Procedure(s) (LRB): CORONARY ARTERY BYPASS GRAFTING (CABG) x Three , using left internal mammary artery and right leg greater saphenous vein harvested endoscopically LIMA to LAD, SVG to DIAG, SVG to RCA. Incision of left internal mammary lymph node Incision of Left Internal  Mediastinal Mass (N/A) CLIPPING OF ATRIAL APPENDAGE - Using AtriCure Clip size 45MM (N/A) TRANSESOPHAGEAL ECHOCARDIOGRAM (TEE) (N/A) Subjective: Feels pretty well, mild nausea, some pain but control is improving Did ambulate some  Objective: Vital signs in last 24 hours: Temp:  [97.5 F (36.4 C)-98.7 F (37.1 C)] 98.3 F (36.8 C) (01/14 0350) Pulse Rate:  [63-97] 85 (01/14 0600) Cardiac Rhythm: Atrial fibrillation (01/14 0400) Resp:  [4-23] 21 (01/14 0600) BP: (103-136)/(67-98) 133/87 (01/14 0600) SpO2:  [90 %-98 %] 92 % (01/14 0600) Arterial Line BP: (114)/(52) 114/52 (01/13 0800) Weight:  [108.6 kg] 108.6 kg (01/14 0500)  Hemodynamic parameters for last 24 hours: PAP: (26)/(13) 26/13 CVP:  [3 mmHg] 3 mmHg  Intake/Output from previous day: 01/13 0701 - 01/14 0700 In: 873.3 [I.V.:522.7; IV Piggyback:350.6] Out: 1290 [Urine:940; Chest Tube:350] Intake/Output this shift: No intake/output data recorded.  General appearance: alert, cooperative and no distress Heart: irregularly irregular rhythm Lungs: mildly decreased in bases Abdomen: soft, nontender Extremities: min edema Wound: incis dressings CDI  Lab Results: Recent Labs    05/28/19 0408 05/28/19 1828  WBC 7.5 10.0  HGB 11.2* 11.8*  HCT 32.7* 34.6*  PLT 81* 79*   BMET:  Recent Labs    05/28/19 1828 05/29/19 0531  NA 134* 135  K 3.7 4.2  CL 102 103  CO2 23 24  GLUCOSE 147* 129*  BUN 16 17  CREATININE 1.27* 1.11  CALCIUM 8.3* 8.2*    PT/INR:  Recent Labs    05/27/19 1353  LABPROT 18.5*  INR 1.6*   ABG    Component  Value Date/Time   PHART 7.412 05/27/2019 2025   HCO3 21.0 05/27/2019 2025   TCO2 22 05/27/2019 2025   ACIDBASEDEF 4.0 (H) 05/27/2019 2025   O2SAT 98.0 05/27/2019 2025   CBG (last 3)  Recent Labs    05/29/19 0039 05/29/19 0349 05/29/19 0447  GLUCAP 118* 117* 131*    Meds Scheduled Meds: . acetaminophen  1,000 mg Oral Q6H   Or  . acetaminophen (TYLENOL) oral liquid 160 mg/5 mL  1,000 mg Per Tube Q6H  . aspirin EC  325 mg Oral Daily   Or  . aspirin  324 mg Per Tube Daily  . atorvastatin  10 mg Oral QHS  . bisacodyl  10 mg Oral Daily   Or  . bisacodyl  10 mg Rectal Daily  . chlorhexidine gluconate (MEDLINE KIT)  15 mL Mouth Rinse BID  . Chlorhexidine Gluconate Cloth  6 each Topical Daily  . docusate sodium  200 mg Oral Daily  . insulin aspart  0-24 Units Subcutaneous Q4H  . metoprolol tartrate  12.5 mg Oral BID   Or  . metoprolol tartrate  12.5 mg Per Tube BID  . pantoprazole  40 mg Oral Daily  . sodium chloride flush  3 mL Intravenous Q12H   Continuous Infusions: . sodium chloride Stopped (05/28/19 0838)  . sodium chloride    . sodium chloride 10 mL/hr at 05/29/19 0600  . cefUROXime (ZINACEF)  IV   Stopped (05/28/19 2056)  . dexmedetomidine (PRECEDEX) IV infusion Stopped (05/28/19 0615)  . lactated ringers    . lactated ringers Stopped (05/28/19 1142)  . nitroGLYCERIN    . phenylephrine (NEO-SYNEPHRINE) Adult infusion Stopped (05/27/19 1510)   PRN Meds:.sodium chloride, metoprolol tartrate, midazolam, morphine injection, ondansetron (ZOFRAN) IV, oxyCODONE, sodium chloride flush, traMADol  Xrays DG Chest Port 1 View  Result Date: 05/28/2019 CLINICAL DATA:  Recent CABG. EXAM: PORTABLE CHEST 1 VIEW COMPARISON:  05/27/2019. FINDINGS: Interval extubation. Nasogastric tube has been removed. Left IJ Swan-Ganz catheter tip is in the proximal right pulmonary artery. A catheter projecting over the spine may represent a mediastinal drain. Left atrial appendage clip is in place.  Left chest tube terminates at the apex of the left hemithorax. Heart is enlarged, similar. Thoracic aorta is calcified. Lungs are low in volume with bibasilar airspace opacification, left greater than right. There may be a small left pleural effusion. No definite pneumothorax. IMPRESSION: 1. Low lung volumes with mild bibasilar airspace opacification, left greater than right, likely due to atelectasis. 2. Possible small left pleural effusion. 3. No pneumothorax with left chest tube in place. 4.  Aortic atherosclerosis (ICD10-I70.0). Electronically Signed   By: Lorin Picket M.D.   On: 05/28/2019 08:41   DG Chest Port 1 View  Result Date: 05/27/2019 CLINICAL DATA:  Status post coronary bypass graft. EXAM: PORTABLE CHEST 1 VIEW COMPARISON:  May 23, 2019. FINDINGS: Left-sided chest tube is noted without pneumothorax. Endotracheal and nasogastric tubes are unchanged in position. Left internal jugular Swan-Ganz catheter is noted with tip directed toward right pulmonary artery. Right lung is clear. Mild left basilar subsegmental atelectasis is noted. Bony thorax is unremarkable. IMPRESSION: Left-sided chest tube is unchanged without pneumothorax. Mild left basilar subsegmental atelectasis is noted. Left internal jugular Swan-Ganz catheter tip is directed toward right pulmonary artery. Electronically Signed   By: Marijo Conception M.D.   On: 05/27/2019 14:41    Assessment/Plan: S/P Procedure(s) (LRB): CORONARY ARTERY BYPASS GRAFTING (CABG) x Three , using left internal mammary artery and right leg greater saphenous vein harvested endoscopically LIMA to LAD, SVG to DIAG, SVG to RCA. Incision of left internal mammary lymph node Incision of Left Internal  Mediastinal Mass (N/A) CLIPPING OF ATRIAL APPENDAGE - Using AtriCure Clip size 45MM (N/A) TRANSESOPHAGEAL ECHOCARDIOGRAM (TEE) (N/A)  1 conts to do well POD#2 2 hemodyn stable in chronic afib, rate controlled,poss restart eliquis soon. Follow BP trends, was  on ARB preop 3 sats good on RA, routine pulm toilet and cardiac rehab 4 CT drainage 350 yesterday- only 130 in last 12 hours, no significant effusion on CXR- d/c tube 5 good UOP, wt up still c/w preop- will start a gentle diuresis, normal renal function. D/C foley 6 only has central line- ? D/C if we can get peripheral IV placed 7 repeat CBC in am 8 path pending 9 BS ok- restart metformin 10 tx to 4 e  LOS: 2 days    Sean Giovanni PA-C 05/29/2019 Pager [phone]  Tubes out this am, chest tube ij and foley To step down I have seen and examined [name] and agree with the above assessment  and plan.  Grace Isaac MD Beeper [phone] Office [phone] 05/29/2019 8:11 AM

## 2019-05-29 NOTE — Progress Notes (Signed)
Setup patient's CPAP machine and put sterile water in machine for patient.  Unit is at bedside along with mask for patient to put hisself on when he is ready for bed.  No distress noted at this time, will continue to monitor.

## 2019-05-29 NOTE — Plan of Care (Signed)
Continue to monitor

## 2019-05-29 NOTE — Progress Notes (Signed)
Left IJ removed at this time. Vaseline gauze and pressure applied. Patient tolerated well. Will continue to monitor.   Emelda Fear, RN

## 2019-05-29 NOTE — Progress Notes (Signed)
Noticed pt HR up to 150s- pt had gotten himself up to bathroom. Assisted in clean-up and back to bed, HR down to 90s. Bed alarm on. VSS- in chart. Will continue to closely monitor.  Jaymes Graff, RN

## 2019-05-29 NOTE — Progress Notes (Signed)
CARDIAC REHAB PHASE I   PRE:  Rate/Rhythm: 93 afib  BP:  Supine:   Sitting: 134/89  Standing:    SaO2: 96%RA  MODE:  Ambulation: 200 ft   POST:  Rate/Rhythm: 110 afib  BP:  Supine:   Sitting: 138/89  Standing:    SaO2: 90-91%RA 1350-1430 Third walk for today. Pt walked 200 ft on RA with rolling walker and asst x 1 with fairly steady gait. Encouraged pt to stay close to walker. To bathroom prior to walk. Reminded pt of sternal precautions and encouraged to use IS. Pt stated he did not do as well this walk as on unit. A little more exhausted. Back to bed after walk. Wife in room. Congratulated pt on already having walked three times today.   Graylon Good, RN BSN  05/29/2019 2:28 PM

## 2019-05-29 NOTE — Plan of Care (Signed)
[name] is a s/p CABG patient. He has had his pain managed with PRN meds. Chest tubes continuing to have output. Has been A&Ox4. UO wnl. Foley care completed this shift. BG monitored and managed per order. No acute events this shift. Will continue to monitor.   Problem: Education: Goal: Will demonstrate proper wound care and an understanding of methods to prevent future damage Outcome: Progressing Goal: Knowledge of disease or condition will improve Outcome: Progressing   Problem: Activity: Goal: Risk for activity intolerance will decrease Outcome: Progressing   Problem: Cardiac: Goal: Will achieve and/or maintain hemodynamic stability Outcome: Progressing   Problem: Clinical Measurements: Goal: Postoperative complications will be avoided or minimized Outcome: Progressing   Problem: Respiratory: Goal: Respiratory status will improve Outcome: Progressing   Problem: Skin Integrity: Goal: Wound healing without signs and symptoms of infection Outcome: Progressing   Problem: Urinary Elimination: Goal: Ability to achieve and maintain adequate renal perfusion and functioning will improve Outcome: Progressing

## 2019-05-29 NOTE — Progress Notes (Addendum)
Patient arrived to 4E room 23 at this time. Telemetry applied. CCMD notified. V/s and assessment complete. CHG bath done. Patient oriented to room and how to call nurse with any needs. Will continue to monitor.   Emelda Fear, RN

## 2019-05-29 NOTE — Progress Notes (Signed)
Spoke with Lorenza Chick RN and she is aware of the DC central line order. PIV was placed.

## 2019-05-29 NOTE — Progress Notes (Signed)
Patient transferred to 4E23 on tele. RN to receive patient at bedside.

## 2019-05-30 ENCOUNTER — Inpatient Hospital Stay (HOSPITAL_COMMUNITY): Payer: Medicare Other

## 2019-05-30 LAB — GLUCOSE, CAPILLARY
Glucose-Capillary: 119 mg/dL — ABNORMAL HIGH (ref 70–99)
Glucose-Capillary: 144 mg/dL — ABNORMAL HIGH (ref 70–99)
Glucose-Capillary: 130 mg/dL — ABNORMAL HIGH (ref 70–99)

## 2019-05-30 LAB — BASIC METABOLIC PANEL
Anion gap: 10 (ref 5–15)
BUN: 17 mg/dL (ref 8–23)
CO2: 26 mmol/L (ref 22–32)
Calcium: 8.4 mg/dL — ABNORMAL LOW (ref 8.9–10.3)
Chloride: 98 mmol/L (ref 98–111)
Creatinine, Ser: 1.22 mg/dL (ref 0.61–1.24)
GFR calc Af Amer: >60 mL/min (ref >60)
GFR calc non Af Amer: 58 mL/min — ABNORMAL LOW (ref >60)
Glucose, Bld: 117 mg/dL — ABNORMAL HIGH (ref 70–99)
Potassium: 4 mmol/L (ref 3.5–5.1)
Sodium: 134 mmol/L — ABNORMAL LOW (ref 135–145)

## 2019-05-30 LAB — CBC
HCT: 34.1 % — ABNORMAL LOW (ref 39.0–52.0)
Hemoglobin: 11.4 g/dL — ABNORMAL LOW (ref 13.0–17.0)
MCH: 31.8 pg (ref 26.0–34.0)
MCHC: 33.4 g/dL (ref 30.0–36.0)
MCV: 95 fL (ref 80.0–100.0)
Platelets: 87 10*3/uL — ABNORMAL LOW (ref 150–400)
RBC: 3.59 MIL/uL — ABNORMAL LOW (ref 4.22–5.81)
RDW: 13 % (ref 11.5–15.5)
WBC: 8.7 10*3/uL (ref 4.0–10.5)
nRBC: 0 % (ref 0.0–0.2)

## 2019-05-30 MED ORDER — ASPIRIN EC 81 MG PO TBEC
81.0000 mg | DELAYED_RELEASE_TABLET | Freq: Every day | ORAL | Status: DC
  Administered 2019-05-31 – 2019-06-01 (×2): 81 mg via ORAL
  Filled 2019-05-30 (×2): qty 1

## 2019-05-30 MED ORDER — INSULIN ASPART 100 UNIT/ML ~~LOC~~ SOLN: 0.0000 [IU] | Freq: Two times a day (BID) | SUBCUTANEOUS | Status: DC

## 2019-05-30 MED ORDER — METOPROLOL TARTRATE 25 MG PO TABS
25.0000 mg | ORAL_TABLET | Freq: Two times a day (BID) | ORAL | Status: DC
  Administered 2019-05-30 – 2019-06-01 (×5): 25 mg via ORAL
  Filled 2019-05-30 (×5): qty 1

## 2019-05-30 MED ORDER — PROSIGHT PO TABS
1.0000 | ORAL_TABLET | Freq: Every day | ORAL | Status: DC
  Administered 2019-05-30 – 2019-06-01 (×3): 1 via ORAL
  Filled 2019-05-30 (×3): qty 1

## 2019-05-30 MED ORDER — LOSARTAN POTASSIUM 50 MG PO TABS
50.0000 mg | ORAL_TABLET | Freq: Two times a day (BID) | ORAL | Status: DC
  Administered 2019-05-30 – 2019-06-01 (×4): 50 mg via ORAL
  Filled 2019-05-30 (×4): qty 1

## 2019-05-30 MED ORDER — FUROSEMIDE 10 MG/ML IJ SOLN
40.0000 mg | Freq: Once | INTRAMUSCULAR | Status: AC
  Administered 2019-05-30: 40 mg via INTRAVENOUS
  Filled 2019-05-30: qty 4

## 2019-05-30 MED FILL — Lidocaine HCl Local Soln Prefilled Syringe 100 MG/5ML (2%): INTRAMUSCULAR | Qty: 5 | Status: AC

## 2019-05-30 MED FILL — Sodium Chloride IV Soln 0.9%: INTRAVENOUS | Qty: 3000 | Status: AC

## 2019-05-30 MED FILL — Electrolyte-R (PH 7.4) Solution: INTRAVENOUS | Qty: 3000 | Status: AC

## 2019-05-30 MED FILL — Heparin Sodium (Porcine) Inj 1000 Unit/ML: INTRAMUSCULAR | Qty: 10 | Status: AC

## 2019-05-30 MED FILL — Mannitol IV Soln 20%: INTRAVENOUS | Qty: 500 | Status: AC

## 2019-05-30 NOTE — Progress Notes (Addendum)
[address]       ,[place] [address]             [phone]      3 Days Post-Op Procedure(s) (LRB): CORONARY ARTERY BYPASS GRAFTING (CABG) x Three , using left internal mammary artery and right leg greater saphenous vein harvested endoscopically LIMA to LAD, SVG to DIAG, SVG to RCA. Incision of left internal mammary lymph node Incision of Left Internal  Mediastinal Mass (N/A) CLIPPING OF ATRIAL APPENDAGE - Using AtriCure Clip size 45MM (N/A) TRANSESOPHAGEAL ECHOCARDIOGRAM (TEE) (N/A) Subjective: Feels some soreness   Objective: Vital signs in last 24 hours: Temp:  [97.5 F (36.4 C)-98.2 F (36.8 C)] 97.6 F (36.4 C) (01/15 0440) Pulse Rate:  [85-99] 99 (01/14 1245) Cardiac Rhythm: Atrial fibrillation (01/14 1903) Resp:  [14-20] 20 (01/15 0440) BP: (121-158)/(79-97) 147/90 (01/15 0440) SpO2:  [84 %-99 %] 94 % (01/15 0440) Weight:  [106.5 kg] 106.5 kg (01/15 0443)  Hemodynamic parameters for last 24 hours:    Intake/Output from previous day: 01/14 0701 - 01/15 0700 In: -  Out: 920 [Urine:800; Chest Tube:120] Intake/Output this shift: No intake/output data recorded.  General appearance: alert, cooperative and no distress Heart: regularly irregular rhythm and tachy Lungs: mildly dim in bases Abdomen: benign Extremities: no significant edema Wound: evh site ok, chest dressing in place  Lab Results: Recent Labs    05/28/19 1828 05/30/19 0338  WBC 10.0 8.7  HGB 11.8* 11.4*  HCT 34.6* 34.1*  PLT 79* 87*   BMET:  Recent Labs    05/29/19 0531 05/30/19 0338  NA 135 134*  K 4.2 4.0  CL 103 98  CO2 24 26  GLUCOSE 129* 117*  BUN 17 17  CREATININE 1.11 1.22  CALCIUM 8.2* 8.4*    PT/INR:  Recent Labs    05/27/19 1353  LABPROT 18.5*  INR 1.6*   ABG    Component Value Date/Time   PHART 7.412 05/27/2019 2025   HCO3 21.0 05/27/2019 2025   TCO2 22 05/27/2019 2025   ACIDBASEDEF 4.0 (H) 05/27/2019 2025   O2SAT 98.0 05/27/2019 2025     CBG (last 3)  Recent Labs    05/29/19 1131 05/29/19 1633 05/29/19 2307  GLUCAP 122* 121* 116*    Meds Scheduled Meds: . aspirin EC  325 mg Oral Daily  . atorvastatin  40 mg Oral q1800  . Chlorhexidine Gluconate Cloth  6 each Topical Daily  . docusate sodium  200 mg Oral Daily  . insulin aspart  0-24 Units Subcutaneous Q8H  . metFORMIN  500 mg Oral Q breakfast  . metoprolol tartrate  12.5 mg Oral BID  . multivitamin with minerals  1 tablet Oral Daily  . pantoprazole  40 mg Oral QAC breakfast  . potassium chloride  20 mEq Oral Daily  . sodium chloride flush  3 mL Intravenous Q12H   Continuous Infusions: . sodium chloride     PRN Meds:.sodium chloride, acetaminophen, alum & mag hydroxide-simeth, bisacodyl **OR** bisacodyl, magnesium hydroxide, ondansetron **OR** ondansetron (ZOFRAN) IV, oxyCODONE, sodium chloride flush, traMADol  Xrays DG Chest Port 1 View In am  Result Date: 05/29/2019 CLINICAL DATA:  Shortness of breath, chest pain. EXAM: PORTABLE CHEST 1 VIEW COMPARISON:  May 28, 2019. FINDINGS: Stable cardiomegaly. Status post coronary bypass graft. Left-sided chest tube is unchanged in position. No pneumothorax is noted. Right lung is clear. Minimal left basilar atelectasis is noted with small left pleural effusion. Swan-Ganz catheter has been  removed. Bony thorax is unremarkable. IMPRESSION: Left-sided chest tube is unchanged in position. Minimal left basilar atelectasis is noted with small left pleural effusion. No pneumothorax is noted. Electronically Signed   By: Marijo Conception M.D.   On: 05/29/2019 07:30    Assessment/Plan: S/P Procedure(s) (LRB): CORONARY ARTERY BYPASS GRAFTING (CABG) x Three , using left internal mammary artery and right leg greater saphenous vein harvested endoscopically LIMA to LAD, SVG to DIAG, SVG to RCA. Incision of left internal mammary lymph node Incision of Left Internal  Mediastinal Mass (N/A) CLIPPING OF ATRIAL APPENDAGE - Using  AtriCure Clip size 45MM (N/A) TRANSESOPHAGEAL ECHOCARDIOGRAM (TEE) (N/A)  1 doing well overall 2 afib , some tachy/PVC's- will increase beta blocker dose 3 BS ok on metformin- d/c ssi 4 renal fxn normal, cont gentle diuresis, Good UOP- weight trending down 5 H/H stable 6 no fevers or leukocytosis 7 path Type AB thymoma 8 routine rehab/pulm toilet 9 home 2-3 days   LOS: 3 days    Sean Giovanni  PA-C 05/30/2019 Pager [phone]  Pacing wires out today , resume Eliquis at d/c Discussed path with patient  I have seen and examined [name] and agree with the above assessment  and plan.  Grace Isaac MD Beeper [phone] Office [phone] 05/30/2019 8:51 AM

## 2019-05-30 NOTE — Progress Notes (Signed)
Made sure CPAP was at beside where patient could place himself on.  Made sure sterile water level was okay for patient.  Patient placed himself on his machine.  O2 sat is around 95%, no distress noted, will continue to monitor.

## 2019-05-30 NOTE — Progress Notes (Signed)
CARDIAC REHAB PHASE I   PRE:  Rate/Rhythm: 103 afib  BP:  Supine: 116/82  Sitting:   Standing:    SaO2: 92%RA  MODE:  Ambulation: 370 ft   POST:  Rate/Rhythm: 108 afib  BP:  Supine:   Sitting: 118/63  Standing:    SaO2: 96-100%RA 1432-1505 Came at 1100 but pt just getting back in bed and not up to walking. Came at 1400 and still needed 30 minutes for pacing wire removal. Returned to walk pt now. Pt walked 370 ft on RA with rolling walker after walking to bathroom. Pt gets in a hurry with walker when going to bathroom. Reinforced with pt to take his time. To recliner after walk with wife in room. Discussed CRP 2 with pt and wife as wife had attended in past and asking about referral. Referral made to [facility]. Also interested in Virtual or telephone calls. Gave pt IS as his was broken and flutter valve. Pt demonstrated about 1100 ml on IS and demonstrated flutter valve with cues. Instructed to do both 10x an hour. Pt would like rolling walker for home use. Notified case Freight forwarder. Pt is interested in participating in Virtual Cardiac and Pulmonary Rehab. Pt advised that Virtual Cardiac and Pulmonary Rehab is provided at no cost to the patient.  Checklist:  1. Pt has smart device  ie smartphone and/or ipad for downloading an app  Yes 2. Reliable internet/wifi service    Yes 3. Understands how to use their smartphone and navigate within an app.  Yes   Pt verbalized understanding and is in agreement.    Graylon Good, RN BSN  05/30/2019 3:02 PM

## 2019-05-30 NOTE — Op Note (Signed)
NAME: [name], [name] MEDICAL RECORD [id number] ACCOUNT [id number] DATE OF BIRTH:03-06-1946 FACILITY: [facility] LOCATION: [facility] PHYSICIAN:Stark Aguinaga Maryruth Bun, MD  OPERATIVE REPORT  DATE OF PROCEDURE:  05/27/2019  PREOPERATIVE DIAGNOSIS:  Coronary occlusive disease with refractory angina.  POSTOPERATIVE DIAGNOSES:  Coronary occlusive disease with refractory angina, anterior mediastinal mass.  PROCEDURE PERFORMED: 1 Coronary artery bypass grafting x3 with the left internal mammary to the left anterior descending coronary artery, reverse saphenous vein graft to the diagonal coronary artery, reverse saphenous vein graft to the distal right  coronary artery with right greater saphenous thigh endoscopic vein harvesting 2. Placement of Atriclip- Left Atrial Clip 45 mm  3. Sampling of left internal mammary lymph node 4. 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  SURGEON:  Lanelle Bal, MD  FIRST ASSISTANT:  Jadene Pierini, PA  BRIEF HISTORY:  The patient is a 74 year old male who had undergone previous evaluation for coronary artery disease including a cardiac CT scan of the chest with FFR and ultimately cardiac catheterization by Dr. Daneen Schick.  At the time of  catheterization, the patient was found to have serial 70% stenosis of a large dominant right coronary system with a large posterior lateral branch, a very small posterior descending that was subtotally occluded.  In addition, the patient had an anomalous  right coronary artery, giving rise to the circumflex that as it arose in the lateral wall, was a very small branch.  The patient had disease in the LAD and a large diagonal that was a 70% and long lesion, making possibility for stenting a poor option.   Initially after cardiac catheterization, it was decided to treat the patient medically aggressively; however, in spite of increasing his Imdur and good medical therapy, he continued to have anginal symptoms including fatigue  and chest discomfort and  shortness of breath with exertion.  Preoperatively, echocardiogram showed a large left atrium, but without significant aortic or mitral insufficiency.  The left atrium was 5.6 cm.  The patient has had chronic atrial fibrillation for years and maintained  on Eliquis.  Because of refractory angina in spite of attempts at good medical management, the patient was referred for coronary artery bypass graft.  Risks and options were discussed with the patient and his wife in detail and he was agreeable and  signed informed consent.  DESCRIPTION OF PROCEDURE:  With Swan-Ganz and arterial line monitors in place, the patient underwent general endotracheal anesthesia without incident.  The skin and chest and legs were prepped with Betadine, draped in the usual sterile manner.   Appropriate timeout was performed.  We proceeded with endoscopic vein harvesting of the right greater saphenous vein from the thigh and upper calf on the right.  This vein was of excellent quality and caliber.  Median sternotomy was performed.  Left  internal mammary artery was dissected down as a pedicle graft.  As we were dissecting down on the mammary artery, there was a slightly enlarged mediastinal internal mammary lymph node that was sent for frozen section.  We then proceeded with opening the  pericardium.  As noted on echocardiogram, the patient's right and left atrium were significantly enlarged.  There was no clot noted in the left atrial appendage.  The patient was systemically heparinized.  The ascending aorta was cannulated.  The right  atrium was cannulated.  An aortic root vent cardioplegia needle was introduced into the ascending aorta.  The patient was placed on cardiopulmonary bypass, 2.4 liters per minute per meter squared.  Sites of anastomosis were inspected and dissected out of  the epicardium.  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  As it arose on the lateral  wall of the heart, it was a very small branch, less than 1 mm and not suitable for bypass.  The diagonal branch was a large dominant  vessel supplying a portion of the lateral wall.  The posterior descending was a very small branch.  We then proceeded with placement of aortic crossclamp.  Then, 600 mL of cold blood potassium cardioplegia was administered with diastolic arrest of the  heart.  Myocardial septal temperatures monitored throughout the crossclamp.  We first turned our attention to the right coronary system.  In the distal right coronary, we opened and it was a very large vessel, greater than 2 mm in size.  Using a running  7-0 Prolene, we anastomosed a segment of reverse saphenous vein graft to the distal right coronary artery.  Additional cold blood cardioplegia was administered.  The heart was then elevated.  A 45 mm AtriCure left atrial clip was applied to the left  atrial appendage.  We then proceeded with opening the large diagonal coronary artery, admitting a 1.5 mm probe distally.  Using a running 7-0 Prolene, distal anastomosis was performed.  We then turned our attention to the left anterior descending  coronary artery, opening the LAD, admitted a 1.5 mm probe distally.  Using a running 8-0 Prolene, the left internal mammary artery was anastomosed to left anterior descending coronary artery.  With the cross clamp still in place, 2 punch aortotomies were  performed and each of the 2 vein grafts were anastomosed to the ascending aorta.  The bulldog was removed from the mammary artery with prompt rise in myocardial temperature.  The heart was allowed to passively fill and deair and the proximal anastomoses  were completed.  The crossclamp was removed with a total crossclamp time of 88 minutes.  Without cardioversion, he converted to a sinus rhythm.  Sites of anastomosis were inspected and were free of bleeding.  As we were checking along the  bed of  the internal mammary artery and into the  upper mediastinum on the left, there was slight discoloration under the mediastinal pleura.  On palpation, this was a somewhat firm area.  We then opened the pleura over this area and it became apparent there was  a 3 cm mediastinal mass in the left upper superior mediastinum.  This appeared to be a thymoma, though there was no direct invasion to surrounding tissues.  Taking care not to injure the left phrenic nerve, we removed the mass and sent it to pathology  for frozen section.  Frozen section showed spindle cell tumor.  With this completed, we then ventilated the patient and weaned him from cardiopulmonary bypass without difficulty.  He was decannulated in the usual fashion.  Protamine sulfate was  administered.  With the operative field hemostatic, atrial and ventricular pacing wires were applied.  Graft markers were applied.  A left pleural tube and Blake mediastinal drain were left in place.  Pericardium was loosely reapproximated.  Sternum was  closed with #6 stainless steel wire.  Fascia closed with interrupted 0 Vicryl, running 3-0 Vicryl in subcutaneous tissue, 4-0 subcuticular stitch in skin edges.  Dry dressings were applied.  Sponge and needle count was reported as correct at completion  of procedure.  The patient tolerated the procedure without obvious complication. He did not require blood  bank blood products.  JN/NUANCE  D:05/29/2019 T:05/30/2019 JOB:[id number]

## 2019-05-30 NOTE — Discharge Summary (Signed)
Physician Discharge Summary  Patient ID: [name] MRN: [id number] DOB/AGE: Sep 28, 1945 74 y.o.  Admit date: 05/27/2019 Discharge date: 06/01/2019  Admission Diagnoses: Severe coronary artery disease  Discharge Diagnoses:  Active Problems:   S/P CABG x 3   Type AB thymoma [facility])  Patient Active Problem List   Diagnosis Date Noted  . 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 Angina pectoris ([place])   . OSA on CPAP 11/01/2017  . Lumbar spinal stenosis 09/27/2017  . Primary osteoarthritis of left knee 08/06/2014  . Left knee DJD 08/06/2014  . Preoperative clearance 02/28/2012  . Exertional dyspnea 10/02/2011  . Atrial fibrillation ([place])   . Prostate cancer ([place])   . Palpitations   . Diabetes mellitus   . Hypercholesteremia    History of Present Illness:   at time of admission/office evaluation [name] 74 y.o. male is seen in the office for evalution for CABG. He underwent cardiac cath 02/2019 for worsening DOE and chest tightness. He has a history of persistent atrial fibrillation, on chronic anticoagulation with Eliquis, OSA on CPAP, HTN, HLD, obesity, chronic back pain/OA and chronic right middle lung nodule followed by CT.   Patient returned to the office  to further discuss symptoms.  He notes continued shortness of breath with exertion, occasional episodes of substernal discomfort in the morning when he first gets up.  He does do some activities around the yard.  He notes that the recent doubling of his isosorbide has helped some but has not eliminated the symptoms of angina, dyspnea on exertion.  As the  patient underwent previous cardiac catheterization on March 14, 2019 which showed moderately severe three-vessel coronary disease and after attempts were made to treat medically were felt to be unsuccessful it was felt that he would benefit with proceeding with CABG and was admitted this hospitalization for the procedure.   Discharged Condition:  good  Hospital Course: The patient was admitted electively and on 05/27/2019 taken to the operating room where he underwent the below described procedure.  He tolerated it well and was taken to the surgical intensive care unit in stable condition.  Postoperative hospital course:  The patient is overall done well.  He has maintained stable hemodynamics and was weaned from the ventilator without difficulty using standard protocols.  All routine lines, monitors and drainage devices have been discontinued in the standard fashion.  He does have a mild expected acute blood loss anemia which is stable.  He did have some postoperative volume overload but is responding well to diuretics.  Renal function has remained within normal limits.  Oxygen has been weaned and he maintains good saturations on room air.  He has maintained his chronic atrial fibrillation with some elevated heart rate and beta-blocker has been adjusted.  He has also had some postoperative hypertension and his Cozaar has been resumed.  Blood sugars have been under adequate control using standard protocols and he has been transitioned to his preoperative Metformin dose.  He has been started on low-dose aspirin and will be restarted on his previous Eliquis dose prior to discharge.  His incisions are noted to be healing well without evidence of infection.  He is tolerating diet.  He is tolerating gradually increasing activities using standard cardiac rehab protocols.  At the time of discharge the patient is felt to be quite stable.  Consults: None  Significant Diagnostic Studies: routine post-op serial CXR's and labs  Treatments: surgery:  OPERATIVE  REPORT  DATE OF PROCEDURE:  05/27/2019  PREOPERATIVE DIAGNOSIS:  Coronary occlusive disease with refractory angina.  POSTOPERATIVE DIAGNOSES:  Coronary occlusive disease with refractory angina, anterior mediastinal mass.  PROCEDURE PERFORMED: 1 Coronary artery bypass grafting x3 with the  left internal mammary to the left anterior descending coronary artery, reverse saphenous vein graft to the diagonal coronary artery, reverse saphenous vein graft to the distal right  coronary artery with right greater saphenous thigh endoscopic vein harvesting 2. Placement of Atriclip- Left Atrial Clip 45 mm  3. Sampling of left internal mammary lymph node 4. 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  SURGEON:  Lanelle Bal, MD  FIRST ASSISTANT:  Jadene Pierini, PA-C  Discharge Exam: Blood pressure 117/74, pulse 99, temperature 98.6 F (37 C), temperature source Oral, resp. rate 18, height 6' (1.829 m), weight 103.7 kg, SpO2 95 %.   General appearance: alert, cooperative and no distress Heart: irregularly irregular rhythm Lungs: clear to auscultation bilaterally Abdomen: soft, non-tender; bowel sounds normal; no masses,  no organomegaly Extremities: extremities normal, atraumatic, no cyanosis or edema Wound: clean and dry   Disposition: Discharge disposition: 01-Home or Self Care       Discharge Instructions    Amb Referral to Cardiac Rehabilitation   Complete by: As directed    Diagnosis: CABG   CABG X ___: 3   After initial evaluation and assessments completed: Virtual Based Care may be provided alone or in conjunction with Phase 2 Cardiac Rehab based on patient barriers.: Yes     Allergies as of 06/01/2019      Reactions   Lisinopril Cough   Sulfa Drugs Cross Reactors    CHILDHOOD UNSPECIFIED REACTION    Contrast Media [iodinated Diagnostic Agents] Rash   Doxycycline Itching   Ioxaglate Rash   Tetracyclines & Related Rash      Medication List    STOP taking these medications   allopurinol 300 MG tablet Commonly known as: ZYLOPRIM   atenolol-chlorthalidone 50-25 MG tablet Commonly known as: TENORETIC   colchicine 0.6 MG tablet   diclofenac Sodium 1 % Gel Commonly known as: VOLTAREN   isosorbide mononitrate 60 MG 24 hr tablet Commonly  known as: IMDUR   nitroGLYCERIN 0.4 MG SL tablet Commonly known as: NITROSTAT   traMADol 50 MG tablet Commonly known as: ULTRAM     TAKE these medications   acetaminophen 325 MG tablet Commonly known as: TYLENOL Take 2 tablets (650 mg total) by mouth every 6 (six) hours as needed for mild pain.   aspirin 81 MG EC tablet Take 1 tablet (81 mg total) by mouth daily. Start taking on: June 02, 2019   atorvastatin 40 MG tablet Commonly known as: LIPITOR Take 1 tablet (40 mg total) by mouth daily at 6 PM. What changed:   medication strength  how much to take  when to take this   Eliquis 5 MG Tabs tablet Generic drug: apixaban TAKE 1 TABLET BY MOUTH TWO  TIMES DAILY What changed: how much to take   furosemide 20 MG tablet Commonly known as: LASIX Take 1 tablet (20 mg total) by mouth daily. Start taking on: June 02, 2019   losartan 50 MG tablet Commonly known as: COZAAR Take 50 mg by mouth 2 (two) times daily.   metFORMIN 1000 MG tablet Commonly known as: GLUCOPHAGE Take 500 mg by mouth daily with breakfast.   metoprolol tartrate 25 MG tablet Commonly known as: LOPRESSOR Take 1 tablet (25 mg total) by mouth  2 (two) times daily.   multivitamin with minerals Tabs tablet Take 1 tablet by mouth daily. Centrum Silver   oxyCODONE 5 MG immediate release tablet Commonly known as: Oxy IR/ROXICODONE Take 1 tablet (5 mg total) by mouth every 6 (six) hours as needed for severe pain.   potassium chloride 10 MEQ tablet Commonly known as: KLOR-CON Take 1 tablet (10 mEq total) by mouth daily. Start taking on: June 02, 2019   PreserVision AREDS 2 Caps Take 1 capsule by mouth 2 (two) times daily.            Durable Medical Equipment  (From admission, onward)         Start     Ordered   05/30/19 1458  For home use only DME Walker rolling  Once    Question Answer Comment  Walker: With [place] Wheels   Patient needs a walker to treat with the following  condition Weakness      05/30/19 1457         Follow-up Information    Grace Isaac, MD Follow up.   Specialty: Cardiothoracic Surgery Why: See discharge paperwork.  Also obtain a chest x-ray from [place] [facility] 1/2-hour prior to appointment with surgeon.  It is in the same office complex on the first floor. Contact information: [address] [place] [address] [phone]        Burtis Junes, NP Follow up.   Specialties: Nurse Practitioner, Interventional Cardiology, Cardiology, Radiology Why: See discharge paperwork for follow-up with cardiology. Contact information: [place] [place] [address] [phone]        Leanna Battles, MD. Call in 1 day(s).   Specialty: Internal Medicine Contact information: [address] [place] [address] [phone]          The patient has been discharged on:   1.Beta Blocker:  Yes Blue.Reese   ]                              No   [   ]                              If No, reason:  2.Ace Inhibitor/ARB: Yes Blue.Reese   ]                                     No  [    ]                                     If No, reason:  3.Statin:   Yes [ y  ]                  No  [   ]                  If No, reason:  4.Ecasa:  Yes  [  y ]                  No   [   ]                  If No, reason: Signed: Miguel Aschoff 06/01/2019,  8:26 AM

## 2019-05-31 LAB — GLUCOSE, CAPILLARY
Glucose-Capillary: 94 mg/dL (ref 70–99)
Glucose-Capillary: 157 mg/dL — ABNORMAL HIGH (ref 70–99)
Glucose-Capillary: 109 mg/dL — ABNORMAL HIGH (ref 70–99)

## 2019-05-31 MED ORDER — FUROSEMIDE 40 MG PO TABS
40.0000 mg | ORAL_TABLET | Freq: Every day | ORAL | Status: DC
  Administered 2019-05-31 – 2019-06-01 (×2): 40 mg via ORAL
  Filled 2019-05-31 (×2): qty 1

## 2019-05-31 MED ORDER — POTASSIUM CHLORIDE CRYS ER 20 MEQ PO TBCR
20.0000 meq | EXTENDED_RELEASE_TABLET | Freq: Every day | ORAL | Status: DC
  Administered 2019-05-31 – 2019-06-01 (×2): 20 meq via ORAL
  Filled 2019-05-31 (×2): qty 1

## 2019-05-31 NOTE — Progress Notes (Addendum)
[address]       [place] [address]             [phone]      4 Days Post-Op Procedure(s) (LRB): CORONARY ARTERY BYPASS GRAFTING (CABG) x Three , using left internal mammary artery and right leg greater saphenous vein harvested endoscopically LIMA to LAD, SVG to DIAG, SVG to RCA. Incision of left internal mammary lymph node Incision of Left Internal  Mediastinal Mass (N/A) CLIPPING OF ATRIAL APPENDAGE - Using AtriCure Clip size 45MM (N/A) TRANSESOPHAGEAL ECHOCARDIOGRAM (TEE) (N/A) Subjective: Feels good this morning without complaint. He has not been using his IS bc he doesn't have his heart pillow with him  Objective: Vital signs in last 24 hours: Temp:  [98.3 F (36.8 C)-98.7 F (37.1 C)] 98.7 F (37.1 C) (01/16 0355) Pulse Rate:  [71-103] 71 (01/16 0355) Cardiac Rhythm: Atrial fibrillation (01/16 0703) Resp:  [20] 20 (01/15 1908) BP: (103-133)/(67-87) 115/72 (01/16 0355) SpO2:  [90 %-94 %] 94 % (01/16 0355)     Intake/Output from previous day: 01/15 0701 - 01/16 0700 In: 360 [P.O.:360] Out: 1700 [Urine:1700] Intake/Output this shift: No intake/output data recorded.  General appearance: alert, cooperative and no distress Heart: irregularly irregular rhythm Lungs: clear to auscultation bilaterally Abdomen: soft, non-tender; bowel sounds normal; no masses,  no organomegaly Extremities: extremities normal, atraumatic, no cyanosis or edema Wound: clean and dry  Lab Results: Recent Labs    05/28/19 1828 05/30/19 0338  WBC 10.0 8.7  HGB 11.8* 11.4*  HCT 34.6* 34.1*  PLT 79* 87*   BMET:  Recent Labs    05/29/19 0531 05/30/19 0338  NA 135 134*  K 4.2 4.0  CL 103 98  CO2 24 26  GLUCOSE 129* 117*  BUN 17 17  CREATININE 1.11 1.22  CALCIUM 8.2* 8.4*    PT/INR: No results for input(s): LABPROT, INR in the last 72 hours. ABG    Component Value Date/Time   PHART 7.412 05/27/2019 2025   HCO3 21.0 05/27/2019 2025   TCO2 22  05/27/2019 2025   ACIDBASEDEF 4.0 (H) 05/27/2019 2025   O2SAT 98.0 05/27/2019 2025   CBG (last 3)  Recent Labs    05/30/19 1617 05/30/19 1946 05/31/19 0651  GLUCAP 144* 130* 94    Assessment/Plan: S/P Procedure(s) (LRB): CORONARY ARTERY BYPASS GRAFTING (CABG) x Three , using left internal mammary artery and right leg greater saphenous vein harvested endoscopically LIMA to LAD, SVG to DIAG, SVG to RCA. Incision of left internal mammary lymph node Incision of Left Internal  Mediastinal Mass (N/A) CLIPPING OF ATRIAL APPENDAGE - Using AtriCure Clip size 45MM (N/A) TRANSESOPHAGEAL ECHOCARDIOGRAM (TEE) (N/A)  1. CV- chronic A fib rate in the 90s. Continue ASA, statin, and lopressor. Eliquis at d/c 2. Pulm-CXR this morning shows retrocardiac/left lower lobe opacity, likely reflecting atelectasis in this clinical setting, pneumonia not excluded.  3. Renal-creatinine 1.22, electrolytes okay 4. H and H 11.4/34.1 5. Thrombocytopenia-platelets 87k, will continue to trend. 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   Plan: Will check platelets tomorrow before home.  Continue ambulation and using IS/flutter valve. Will get heart pillow for the patient and encouraged fluid intake. Diuresing well.     LOS: 4 days    [name] 05/31/2019  Patient has borderline low plat preop Resume Eliquis tomorrow  Exertion effort improved today Poss home Monday I have seen and examined [name] and agree with the above assessment  and plan.  Grace Isaac MD Beeper [phone] Office [phone] 05/31/2019 11:06 AM

## 2019-05-31 NOTE — Progress Notes (Signed)
Pt ambulated around unit x 470 feet with walker pt tolerated well

## 2019-05-31 NOTE — Progress Notes (Signed)
Patient has home CPAP set up on bedside table within reach. Patient stated he places himself on/off CPAP without assistance. RT instructed patient to have RN call RT if needed. RT will monitor as needed.

## 2019-05-31 NOTE — Progress Notes (Signed)
Patient ambulated in hallway with rolling walker and nursing staff. Back to bed call bell with inreach. Vitals completed. See flow sheet row. Will monitor patient. [place], Bettina Gavia rN

## 2019-05-31 NOTE — Progress Notes (Signed)
CARDIAC REHAB PHASE I   PRE:  Rate/Rhythm: A Fib 101  BP:  Supine: 121/83     SaO2: 99% RA (Poor waveform)  MODE:  Ambulation: 420 ft   POST:  Rate/Rhythm: A Fib 103  BP:  Sitting: 134/85     SaO2: 94% RA (poor waveform)  1000-1050 Pt in bed upon arrival to room stating he has some SOB.  Oxygen level 99% RA with poor waveform. Pt ambulated 55 ft with RW and stand by assist.  Wife walked with pt and RN. Pt c/o some SOB during walk.  O2 saturations 94% RA.  Pt encouraged to sit in chair and use IS and flutter valve.  Pt reminded to perform deep breathing exercises 10 times a day while awake.  Pt verbalized understanding.  Education completed with pt and wife including diet, exercise guidelines, wound care, and sternal precautions.  All questions answered and pt and wife verbalized understanding.  Will inform bedside RN of pt's c/o SOB.   Noel Christmas, RN 05/31/2019 10:44 AM

## 2019-05-31 NOTE — Progress Notes (Signed)
Patient ambulated in hallway with spouse with rolling walker. Will monitor patient. Maico [place]ve[place], Bettina Gavia RN

## 2019-06-01 LAB — CBC
HCT: 33.7 % — ABNORMAL LOW (ref 39.0–52.0)
Hemoglobin: 11.5 g/dL — ABNORMAL LOW (ref 13.0–17.0)
MCH: 31.9 pg (ref 26.0–34.0)
MCHC: 34.1 g/dL (ref 30.0–36.0)
MCV: 93.6 fL (ref 80.0–100.0)
Platelets: 132 10*3/uL — ABNORMAL LOW (ref 150–400)
RBC: 3.6 MIL/uL — ABNORMAL LOW (ref 4.22–5.81)
RDW: 12.7 % (ref 11.5–15.5)
WBC: 8.2 10*3/uL (ref 4.0–10.5)
nRBC: 0 % (ref 0.0–0.2)

## 2019-06-01 LAB — GLUCOSE, CAPILLARY: Glucose-Capillary: 135 mg/dL — ABNORMAL HIGH (ref 70–99)

## 2019-06-01 MED ORDER — ATORVASTATIN CALCIUM 40 MG PO TABS: 40.0000 mg | ORAL_TABLET | Freq: Every day | ORAL | 1 refills | Status: DC

## 2019-06-01 MED ORDER — ASPIRIN 81 MG PO TBEC: 81.0000 mg | DELAYED_RELEASE_TABLET | Freq: Every day | ORAL | 0 refills | Status: DC

## 2019-06-01 MED ORDER — POTASSIUM CHLORIDE CRYS ER 10 MEQ PO TBCR: 10.0000 meq | EXTENDED_RELEASE_TABLET | Freq: Every day | ORAL | 1 refills | Status: DC

## 2019-06-01 MED ORDER — METOPROLOL TARTRATE 25 MG PO TABS: 25.0000 mg | ORAL_TABLET | Freq: Two times a day (BID) | ORAL | 1 refills | Status: DC

## 2019-06-01 MED ORDER — FUROSEMIDE 20 MG PO TABS: 20.0000 mg | ORAL_TABLET | Freq: Every day | ORAL | 1 refills | Status: DC

## 2019-06-01 MED ORDER — APIXABAN 5 MG PO TABS
5.0000 mg | ORAL_TABLET | Freq: Two times a day (BID) | ORAL | Status: DC
  Administered 2019-06-01: 5 mg via ORAL
  Filled 2019-06-01: qty 1

## 2019-06-01 MED ORDER — ACETAMINOPHEN 325 MG PO TABS: 650.0000 mg | ORAL_TABLET | Freq: Four times a day (QID) | ORAL | Status: DC | PRN

## 2019-06-01 MED ORDER — OXYCODONE HCL 5 MG PO TABS: 5.0000 mg | ORAL_TABLET | Freq: Four times a day (QID) | ORAL | 0 refills | Status: DC | PRN

## 2019-06-01 NOTE — Progress Notes (Signed)
Pt ambulated x 470 feet around the unit with front wheel walker pt tolerated well 

## 2019-06-01 NOTE — Progress Notes (Addendum)
      [name]       [place] [address]             [phone]      5 Days Post-Op Procedure(s) (LRB): CORONARY ARTERY BYPASS GRAFTING (CABG) x Three , using left internal mammary artery and right leg greater saphenous vein harvested endoscopically LIMA to LAD, SVG to DIAG, SVG to RCA. Incision of left internal mammary lymph node Incision of Left Internal  Mediastinal Mass (N/A) CLIPPING OF ATRIAL APPENDAGE - Using AtriCure Clip size 45MM (N/A) TRANSESOPHAGEAL ECHOCARDIOGRAM (TEE) (N/A) Subjective: Feels good this morning with no complaints. Already walked this morning at 4am.   Objective: Vital signs in last 24 hours: Temp:  [97.7 F (36.5 C)-98.6 F (37 C)] 98.6 F (37 C) (01/17 0528) Pulse Rate:  [86-104] 92 (01/17 0528) Cardiac Rhythm: Atrial fibrillation (01/17 0528) Resp:  [18-20] 18 (01/17 0528) BP: (119-124)/(78-88) 121/88 (01/17 0528) SpO2:  [93 %-98 %] 95 % (01/17 0528) Weight:  [103.7 kg] 103.7 kg (01/17 0528)     Intake/Output from previous day: 01/16 0701 - 01/17 0700 In: 120 [P.O.:120] Out: 300 [Urine:300] Intake/Output this shift: No intake/output data recorded.  General appearance: alert, cooperative and no distress Heart: irregularly irregular rhythm Lungs: clear to auscultation bilaterally Abdomen: soft, non-tender; bowel sounds normal; no masses,  no organomegaly Extremities: extremities normal, atraumatic, no cyanosis or edema Wound: clean and dry  Lab Results: Recent Labs    05/30/19 0338 06/01/19 0314  WBC 8.7 8.2  HGB 11.4* 11.5*  HCT 34.1* 33.7*  PLT 87* 132*   BMET:  Recent Labs    05/30/19 0338  NA 134*  K 4.0  CL 98  CO2 26  GLUCOSE 117*  BUN 17  CREATININE 1.22  CALCIUM 8.4*    PT/INR: No results for input(s): LABPROT, INR in the last 72 hours. ABG    Component Value Date/Time   PHART 7.412 05/27/2019 2025   HCO3 21.0 05/27/2019 2025   TCO2 22 05/27/2019 2025   ACIDBASEDEF 4.0 (H) 05/27/2019  2025   O2SAT 98.0 05/27/2019 2025   CBG (last 3)  Recent Labs    05/31/19 0926 05/31/19 1719 06/01/19 0636  GLUCAP 157* 109* 135*    Assessment/Plan: S/P Procedure(s) (LRB): CORONARY ARTERY BYPASS GRAFTING (CABG) x Three , using left internal mammary artery and right leg greater saphenous vein harvested endoscopically LIMA to LAD, SVG to DIAG, SVG to RCA. Incision of left internal mammary lymph node Incision of Left Internal  Mediastinal Mass (N/A) CLIPPING OF ATRIAL APPENDAGE - Using AtriCure Clip size 45MM (N/A) TRANSESOPHAGEAL ECHOCARDIOGRAM (TEE) (N/A)  1. CV- chronic A fib rate in the 90s. Continue ASA, statin, and lopressor. Eliquis started today.  2. Pulm-CXR shows retrocardiac/left lower lobe opacity, likely reflecting atelectasis in this clinical setting, pneumonia not excluded.  3. Renal-creatinine 1.22, electrolytes okay 4. H and H 11.5/33.7-expected anemia, stable 5. Thrombocytopenia-platelets 132k today which is much improved.  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   Plan: Home today if okay with Dr. Servando Snare. Started back on Eliquis. No issues overnight and feels good this morning.    LOS: 5 days    [name] 06/01/2019  Walked well yesterday , walking early this am without difficulty Plan d/c home today back on Eliquis  I have seen and examined [name] and agree with the above assessment  and plan.  Grace Isaac MD Beeper [phone] Office [phone] 06/01/2019 12:30 PM

## 2019-06-01 NOTE — Progress Notes (Addendum)
Patient ambulated in hallway 400 feet with rolling walker on room air and nursing staff. Patient tolerated well, in chair call bell with in reach will monitor patient. Keyonte [place], Bettina Gavia RN

## 2019-06-01 NOTE — Discharge Instructions (Signed)
Coronary Artery Bypass Grafting, Care After  This sheet gives you information about how to care for yourself after your procedure. Your doctor may also give you more specific instructions. If you have problems or questions, call your doctor. What can I expect after the procedure? After the procedure, it is common to:  Feel sick to your stomach (nauseous).  Not want to eat as much as normal (lack of appetite).  Have trouble pooping (constipation).  Have weakness and tiredness (fatigue).  Feel sad (depressed) or grouchy (irritable).  Have pain or discomfort around the cuts from surgery (incisions). Follow these instructions at home: Medicines  Take over-the-counter and prescription medicines only as told by your doctor. Do not stop taking medicines or start any new medicines unless your doctor says it is okay.  If you were prescribed an antibiotic medicine, take it as told by your doctor. Do not stop taking the antibiotic even if you start to feel better. Incision care   Follow instructions from your doctor about how to take care of your cuts from surgery. Make sure you: ? Wash your hands with soap and water before and after you change your bandage (dressing). If you cannot use soap and water, use hand sanitizer. ? Change your bandage as told by your doctor. ? Leave stitches (sutures), skin glue, or skin tape (adhesive) strips in place. They may need to stay in place for 2 weeks or longer. If tape strips get loose and curl up, you may trim the loose edges. Do not remove tape strips completely unless your doctor says it is okay.  Make sure the surgery cuts are clean, dry, and protected.  Check your cut areas every day for signs of infection. Check for: ? More redness, swelling, or pain. ? More fluid or blood. ? Warmth. ? Pus or a bad smell.  If cuts were made in your legs: ? Avoid crossing your legs. ? Avoid sitting for long periods of time. Change positions every 30  minutes. ? Raise (elevate) your legs when you are sitting. Bathing  Do not take baths, swim, or use a hot tub until your doctor says it is okay.  You may shower. Pat the surgery cuts dry. Do not rub the cuts to dry. Eating and drinking   Eat foods that are high in fiber, such as beans, nuts, whole grains, and raw fruits and vegetables. Any meats you eat should be lean cut. Avoid canned, processed, and fried foods. This can help prevent trouble pooping. This is also a part of a heart-healthy diet.  Drink enough fluid to keep your pee (urine) pale yellow.  Do not drink alcohol until you are fully recovered. Ask your doctor when it is safe to drink alcohol. Activity  Rest and limit your activity as told by your doctor. You may be told to: ? Stop any activity right away if you have chest pain, shortness of breath, irregular heartbeats, or dizziness. Get help right away if you have any of these symptoms. ? Move around often for short periods or take short walks as told by your doctor. Slowly increase your activities. ? Avoid lifting, pushing, or pulling anything that is heavier than 10 lb (4.5 kg) for at least 6 weeks or as told by your doctor.  Do physical therapy or a cardiac rehab (cardiac rehabilitation) program as told by your doctor. ? Physical therapy involves doing exercises to maintain movement and build strength and endurance. ? A cardiac rehab program includes:  Exercise  training.  Education.  Counseling.  Do not drive until your doctor says it is okay.  Ask your doctor when you can go back to work.  Ask your doctor when you can be sexually active. General instructions  Do not drive or use heavy machinery while taking prescription pain medicine.  Do not use any products that contain nicotine or tobacco. These include cigarettes, e-cigarettes, and chewing tobacco. If you need help quitting, ask your doctor.  Take 2-3 deep breaths every few hours during the day while  you get better. This helps expand your lungs and prevent problems.  If you were given a device called an incentive spirometer, use it several times a day to practice deep breathing. Support your chest with a pillow or your arms when you take deep breaths or cough.  Wear compression stockings as told by your doctor.  Weigh yourself every day. This helps to see if your body is holding (retaining) fluid that may make your heart and lungs work harder.  Keep all follow-up visits as told by your doctor. This is important. Contact a doctor if:  You have more redness, swelling, or pain around any cut.  You have more fluid or blood coming from any cut.  Any cut feels warm to the touch.  You have pus or a bad smell coming from any cut.  You have a fever.  You have swelling in your ankles or legs.  You have pain in your legs.  You gain 2 lb (0.9 kg) or more a day.  You feel sick to your stomach or you throw up (vomit).  You have watery poop (diarrhea). Get help right away if:  You have chest pain that goes to your jaw or arms.  You are short of breath.  You have a fast or irregular heartbeat.  You notice a "clicking" in your breastbone (sternum) when you move.  You have any signs of a stroke. "BE FAST" is an easy way to remember the main warning signs: ? B - Balance. Signs are dizziness, sudden trouble walking, or loss of balance. ? E - Eyes. Signs are trouble seeing or a change in how you see. ? F - Face. Signs are sudden weakness or loss of feeling of the face, or the face or eyelid drooping on one side. ? A - Arms. Signs are weakness or loss of feeling in an arm. This happens suddenly and usually on one side of the body. ? S - Speech. Signs are sudden trouble speaking, slurred speech, or trouble understanding what people say. ? T - Time. Time to call emergency services. Write down what time symptoms started.  You have other signs of a stroke, such as: ? A sudden, very bad  headache with no known cause. ? Feeling sick to your stomach. ? Throwing up. ? Jerky movements you cannot control (seizure). These symptoms may be an emergency. Do not wait to see if the symptoms will go away. Get medical help right away. Call your local emergency services (911 in the [place] Do not drive yourself to the hospital. Summary  After the procedure, it is common to have pain or discomfort in the cuts from surgery (incisions).  Do not take baths, swim, or use a hot tub until your doctor says it is okay.  Slowly increase your activities. You may need physical therapy or cardiac rehab.  Weigh yourself every day. This helps to see if your body is holding fluid. This information is not intended  to replace advice given to you by your health care provider. Make sure you discuss any questions you have with your health care provider. Document Revised: 01/08/2018 Document Reviewed: 01/08/2018 Elsevier Patient Education  [place].  Endoscopic Saphenous Vein Harvesting, Care After This sheet gives you information about how to care for yourself after your procedure. Your health care provider may also give you more specific instructions. If you have problems or questions, contact your health care provider. What can I expect after the procedure? After the procedure, it is common to have:  Pain.  Bruising.  Swelling.  Numbness. Follow these instructions at home: Incision care   Follow instructions from your health care provider about how to take care of your incisions. Make sure you: ? Wash your hands with soap and water before and after you change your bandages (dressings). If soap and water are not available, use hand sanitizer. ? Change your dressings as told by your health care provider. ? Leave stitches (sutures), skin glue, or adhesive strips in place. These skin closures may need to stay in place for 2 weeks or longer. If adhesive strip edges start to loosen and curl  up, you may trim the loose edges. Do not remove adhesive strips completely unless your health care provider tells you to do that.  Check your incision areas every day for signs of infection. Check for: ? More redness, swelling, or pain. ? Fluid or blood. ? Warmth. ? Pus or a bad smell. Medicines  Take over-the-counter and prescription medicines only as told by your health care provider.  Ask your health care provider if the medicine prescribed to you requires you to avoid driving or using heavy machinery. General instructions  Raise (elevate) your legs above the level of your heart while you are sitting or lying down.  Avoid crossing your legs.  Avoid sitting for long periods of time. Change positions every 30 minutes.  Do any exercises your health care providers have given you. These may include deep breathing, coughing, and walking exercises.  Do not take baths, swim, or use a hot tub until your health care provider approves. Ask your health care provider if you may take showers. You may only be allowed to take sponge baths.  Wear compression stockings as told by your health care provider. These stockings help to prevent blood clots and reduce swelling in your legs.  Keep all follow-up visits as told by your health care provider. This is important. Contact a health care provider if:  Medicine does not help your pain.  Your pain gets worse.  You have new leg bruises or your leg bruises get bigger.  Your leg feels numb.  You have more redness, swelling, or pain around your incision.  You have fluid or blood coming from your incision.  Your incision feels warm to the touch.  You have pus or a bad smell coming from your incision.  You have a fever. Get help right away if:  Your pain is severe.  You develop pain, tenderness, warmth, redness, or swelling in any part of your leg.  You have chest pain.  You have trouble breathing. Summary  Raise (elevate) your legs  above the level of your heart while you are sitting or lying down.  Wear compression stockings as told by your health care provider.  Make sure you know which symptoms should prompt you to contact your health care provider.  Keep all follow-up visits as told by your health care provider.  This information is not intended to replace advice given to you by your health care provider. Make sure you discuss any questions you have with your health care provider. Document Revised: 04/08/2018 Document Reviewed: 04/08/2018 Elsevier Patient Education  2020 [employer].

## 2019-06-01 NOTE — Progress Notes (Addendum)
Patient and spouse given discharge instructions medication list and prescriptions sent to personal pharmacy. Patient and spouse verbalized understanding of follow up appointments. All questions answered will discharge home as ordered. IV and tele were dcd. CT sutures removed as ordered and steri strips applied, chest tubes removed on 05/29/19. Transported to exit via wheel chair and nursing staff. Nelda Bucks, Bettina Gavia  RN

## 2019-06-01 NOTE — TOC Transition Note (Signed)
Transition of Care [facility]) - CM/SW Discharge Note   Patient Details  Name: [name] MRN: [id number] Date of Birth: 1946/02/22  Transition of Care [facility]) CM/SW Contact:  Claudie Leach, RN 06/01/2019, 10:05 AM   Final next level of care: Home/Self Care   Discharge Plan and Services  DME Arranged: Gilford Rile rolling DME Agency: [employer] Date DME Agency Contacted: 06/01/19 Time DME Agency Contacted: [id number] Representative spoke with at DME Agency: Bertrum Sol

## 2019-06-04 ENCOUNTER — Encounter: Payer: Self-pay | Admitting: *Deleted

## 2019-06-04 ENCOUNTER — Encounter (HOSPITAL_COMMUNITY): Payer: Self-pay | Admitting: *Deleted

## 2019-06-04 NOTE — Progress Notes (Signed)
Referral received and verified for MD signature.  Follow up appt are 06/25/19 with Cardiology and 2/11 with CV surgeon.   Due to the senior leadership directed instructions to pause onsite cardiac rehab in response to the surge of Covid + patients within the health system staff were deployed to support health system and community needs.  Will wait to check pt insurance benefits and eligibility once onsite cardiac rehab is permitted to reopen. Pt will be contacted for scheduling Virtual Platform  which -Cardiac Rehab upon satisfactorily completing their follow up appt on 06/25/19 with start date after completing CV surgeon follow up.  Cherre Huger, BSN Cardiac and Training and development officer

## 2019-06-05 ENCOUNTER — Other Ambulatory Visit: Payer: Self-pay | Admitting: *Deleted

## 2019-06-05 MED FILL — Potassium Chloride Inj 2 mEq/ML: INTRAVENOUS | Qty: 40 | Status: AC

## 2019-06-05 MED FILL — Magnesium Sulfate Inj 50%: INTRAMUSCULAR | Qty: 10 | Status: AC

## 2019-06-05 MED FILL — Heparin Sodium (Porcine) Inj 1000 Unit/ML: INTRAMUSCULAR | Qty: 30 | Status: AC

## 2019-06-05 NOTE — Progress Notes (Signed)
The proposed treatment discussed in cancer conference 06/05/19 is for discussion purpose only and is not a binding recommendation.  The patient was not physically examined nor present for their treatment options.  Therefore, final treatment plans cannot be decided.  

## 2019-06-09 LAB — SURGICAL PATHOLOGY

## 2019-06-18 NOTE — Progress Notes (Signed)
CARDIOLOGY OFFICE NOTE  Date:  06/25/2019    [name] Date of Birth: 05-Mar-1946 Medical Record [id number]  PCP:  Leanna Battles, MD  Cardiologist:  Servando Snare    Chief Complaint  Patient presents with  . Hospitalization Follow-up    History of Present Illness: [name] is a 74 y.o. male who presents today for a post hospital visit.  Former patient of Dr. Claris Gladden andDr. Tennant's.He basically follows with me.I see his wife as well.  He has chronic atrial fibrillation that has recurred after multiple cardioversions. Hewas previously onCoumadinand then switched over to Eliquis. He had a low risk Myoview inOctoberof 2016.He has had coronary calcifications noted on prior CT scans.He also has OSA on CPAP, hypertension, hyperlipidemia, obesity.Last CT of the chest from 10/2017 - to repeat in 2 years.   I have followed him over the past several years. He had more issues with back pain with associated HTN - we adjusted his medicines. He had his back fixed - BP came down and we were able to titrate down and even stop some of his antihypertensives. We did a telehealth visit back in June - his ACE had been changed to ARB due to cough. Otherwise was doing ok. Stable DOE.  Seen hereearlier in October- noted a new onset of exertional tightness in the his chest - coronary CT was arranged - this is abnormal with +FFR noted.Referred for cardiac cath - to try and manage medicallywith aggressive CV risk factor modificationbut ended up referring on for CABG x 3 along with LA clipping on 05/27/2019 after failing on medical therapy. Post op course was unremarkable. He was placed back on his Eliquis for his AF. He did have a possible thymoma was removed - pathology is unclear with further disposition pending.   The patient does not have symptoms concerning for COVID-19 infection (fever, chills, cough, or new shortness of breath).   Comes in today. Here with his wife [name]. He is  doing ok. Little weak and little lethargic. She feels like he is depressed. He is walking - 30 minutes twice a day - does this in the house due to the weather. Sleeping in the bed. Off narcotic - just using some Ultram that he had prior to his surgery for all his orthopedic issues. Little dizzy. BP running low. Weight is down considerably. No fever or cough. Getting his second COVID vaccine in just a few days.   Past Medical History:  Diagnosis Date  . A-fib ([place])   . Arrhythmia    hx. Atrial Fib. ,"with regurgitation"  . Depression   . Diabetes mellitus   . Dysrhythmia    a. fib-on Eliquis  . Heart murmur   . Hx of echocardiogram    Echo 3/16:  Mild LVH, EF 60-65%, mild MR, severe LAE, mild RAE, PASP 32 mmHg  . Hypercholesteremia   . Hypertension   . Macular degeneration of both eyes   . OSA on CPAP   . Osteoarthritis (arthritis due to wear and tear of joints)   . Palpitations   . Prostate cancer ([place])    seed implantion- 15 to 20 yrs ago  . Sleep apnea    uses CPAP-settings 5  . 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 Ureter, stricture    hx. 2 yrs ago-stent has been removed now    Past Surgical History:  Procedure Laterality Date  . CLIPPING OF ATRIAL APPENDAGE N/A 05/27/2019   Procedure: CLIPPING OF ATRIAL  APPENDAGE - Using AtriCure Clip size 45MM;  Surgeon: Grace Isaac, MD;  Location: [facility];  Service: Open Heart Surgery;  Laterality: N/A;  . COLONOSCOPY  2007  . CORONARY ARTERY BYPASS GRAFT N/A 05/27/2019   Procedure: CORONARY ARTERY BYPASS GRAFTING (CABG) x Three , using left internal mammary artery and right leg greater saphenous vein harvested endoscopically LIMA to LAD, SVG to DIAG, SVG to RCA. Incision of left internal mammary lymph node Incision of Left Internal  Mediastinal Mass;  Surgeon: Grace Isaac, MD;  Location: [facility];  Service: Open Heart Surgery;  Laterality: N/A;  . CYSTOSCOPY W/ RETROGRADES  04/04/2012   Procedure: CYSTOSCOPY WITH RETROGRADE PYELOGRAM;   Surgeon: Molli Hazard, MD;  Location: [facility];  Service: Urology;  Laterality: Bilateral;  . CYSTOSCOPY WITH RETROGRADE PYELOGRAM, URETEROSCOPY AND STENT PLACEMENT  04/04/2012   Procedure: CYSTOSCOPY WITH RETROGRADE PYELOGRAM, URETEROSCOPY AND STENT PLACEMENT;  Surgeon: Molli Hazard, MD;  [facility];  Service: Urology;  Laterality: Right;  . JOINT REPLACEMENT Left    knee  . LEFT HEART CATH AND CORONARY ANGIOGRAPHY N/A 03/14/2019   Procedure: LEFT HEART CATH AND CORONARY ANGIOGRAPHY;  Surgeon: Belva Crome, MD;  Location: [facility];  Service: Cardiovascular;  Laterality: N/A;  . LUMBAR LAMINECTOMY/DECOMPRESSION MICRODISCECTOMY N/A 09/27/2017   Procedure: LAMINECTOMY LUMBAR THREE- LUMBAR FOUR, LUMBAR FOUR- LUMBAR FIVE;  Surgeon: Ashok Pall, MD;  Location: [facility];  Service: Neurosurgery;  Laterality: N/A;  . ROTATOR CUFF REPAIR Left   . seed implants    . TEE WITHOUT CARDIOVERSION N/A 05/27/2019   Procedure: TRANSESOPHAGEAL ECHOCARDIOGRAM (TEE);  Surgeon: Grace Isaac, MD;  Location: [facility];  Service: Open Heart Surgery;  Laterality: N/A;  . TOTAL KNEE ARTHROPLASTY Left 08/06/2014   Procedure: LEFT TOTAL KNEE ARTHROPLASTY;  Surgeon: Susa Day, MD;  [facility];  Service: Orthopedics;  Laterality: Left;     Medications: Current Meds  Medication Sig  . acetaminophen (TYLENOL) 325 MG tablet Take 2 tablets (650 mg total) by mouth every 6 (six) hours as needed for mild pain.  Marland Kitchen aspirin EC 81 MG EC tablet Take 1 tablet (81 mg total) by mouth daily.  Marland Kitchen atorvastatin (LIPITOR) 40 MG tablet Take 1 tablet (40 mg total) by mouth daily at 6 PM.  . ELIQUIS 5 MG TABS tablet TAKE 1 TABLET BY MOUTH TWO  TIMES DAILY  . losartan (COZAAR) 50 MG tablet Take 50 mg by mouth daily.  . metFORMIN (GLUCOPHAGE) 1000 MG tablet Take 500 mg by mouth daily with breakfast.  . metoprolol tartrate (LOPRESSOR) 25 MG tablet Take 1 tablet (25 mg total) by mouth 2 (two) times daily.   . Multiple Vitamin (MULTIVITAMIN WITH MINERALS) TABS tablet Take 1 tablet by mouth daily. Centrum Silver  . Multiple Vitamins-Minerals (PRESERVISION AREDS 2) CAPS Take 1 capsule by mouth 2 (two) times daily.   Marland Kitchen oxyCODONE (OXY IR/ROXICODONE) 5 MG immediate release tablet Take 1 tablet (5 mg total) by mouth every 6 (six) hours as needed for severe pain.  . traMADol (ULTRAM) 50 MG tablet Take 1 tablet (50 mg total) by mouth every 6 (six) hours as needed.  . [DISCONTINUED] furosemide (LASIX) 20 MG tablet Take 1 tablet (20 mg total) by mouth daily.  . [DISCONTINUED] potassium chloride SA (KLOR-CON) 10 MEQ tablet Take 1 tablet (10 mEq total) by mouth daily.     Allergies: Allergies  Allergen Reactions  . Lisinopril Cough  . Sulfa Drugs Cross Reactors  CHILDHOOD UNSPECIFIED REACTION   . Contrast Media [Iodinated Diagnostic Agents] Rash  . Doxycycline Itching  . Ioxaglate Rash  . Tetracyclines & Related Rash    Social History: The patient  reports that he quit smoking about 46 years ago. He has never used smokeless tobacco. He reports current alcohol use. He reports that he does not use drugs.   Family History: The patient's family history includes Cancer in his mother; Colon cancer in his mother; Dementia in his mother; Heart attack in his father and sister; Stroke in his sister.   Review of Systems: Please see the history of present illness.   All other systems are reviewed and negative.   Physical Exam: VS:  BP 96/62   Pulse 73   Ht 6' (1.829 m)   Wt 220 lb (99.8 kg)   SpO2 97%   BMI 29.84 kg/m  .  BMI Body mass index is 29.84 kg/m.  Wt Readings from Last 3 Encounters:  06/25/19 220 lb (99.8 kg)  06/01/19 228 lb 9.9 oz (103.7 kg)  05/23/19 230 lb 5 oz (104.5 kg)    General: Alert - seems a little down.  His weight is down.  Cardiac: Irregular irregular rhythm. His rate is fine. No swelling. No murmur.   Respiratory:  Lungs are clear to auscultation bilaterally with  normal work of breathing.  GI: Soft and nontender.  MS: No deformity or atrophy. Gait and ROM intact.  Skin: Warm and dry. Color is normal.  Neuro:  Strength and sensation are intact and no gross focal deficits noted.  Psych: Alert, appropriate and with normal affect.   LABORATORY DATA:  EKG:  EKG is not ordered today. This demonstrates AF with controlled VR - unchanged.  Lab Results  Component Value Date   WBC 8.2 06/01/2019   HGB 11.5 (L) 06/01/2019   HCT 33.7 (L) 06/01/2019   PLT 132 (L) 06/01/2019   GLUCOSE 117 (H) 05/30/2019   CHOL 137 10/28/2018   TRIG 215 (H) 10/28/2018   HDL 33 (L) 10/28/2018   LDLCALC 61 10/28/2018   ALT 29 05/23/2019   AST 32 05/23/2019   NA 134 (L) 05/30/2019   K 4.0 05/30/2019   CL 98 05/30/2019   CREATININE 1.22 05/30/2019   BUN 17 05/30/2019   CO2 26 05/30/2019   INR 1.6 (H) 05/27/2019   HGBA1C 5.5 05/23/2019       BNP (last 3 results) No results for input(s): BNP in the last 8760 hours.  ProBNP (last 3 results) No results for input(s): PROBNP in the last 8760 hours.   Other Studies Reviewed Today:  CABG 05/2019 PROCEDURE PERFORMED: 1 Coronary artery bypass grafting x3 with the left internal mammary to the left anterior descending coronary artery, reverse saphenous vein graft to the diagonal coronary artery, reverse saphenous vein graft to the distal right  coronary artery with right greater saphenous thigh endoscopic vein harvesting 2. Placement of Atriclip- Left Atrial Clip 45 mm  3. Sampling of left internal mammary lymph node 4. 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   LEFT HEART CATH AND CORONARY ANGIOGRAPHY10/30/2020  Conclusion   Moderately severe three-vessel coronary disease.  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 2 small obtuse marginal branch is supplied on the left lateral wall.  40% proximal LAD with 60 to 70% diffuse mid to  distal LAD. First diagonal contains eccentric 50 to 70% narrowing.  Right coronary is diffusely diseased  with mid 60 to 65% stenosis. Distal 75% stenosis before small PDA. 99% stenosis in the continuation of the right coronary before the second PLA. The acute marginal branch of the right coronary has multifocal 95% stenoses.  Low normal LVEF approximately 50% with normal EDP.  RECOMMENDATIONS:   Aggressive risk preventive therapy with LDL target less than 55.  Add long-acting nitrate therapy to improve exertional dyspnea and chest tightness.  If unacceptable symptoms, would consider multivessel coronary bypass grafting. In absence of high-grade proximal CAD, I feel a measured approach attempting to control symptoms with medications is a reasonable first option.     ASSESSMENT & PLAN:   1.CAD - now s/p CABG x 3 - slow but stable progress. BP soft. Cutting back/stopping medicines today - will decrease his ARB to QD. Stopping Lasix and potassium. Surveillance lab today. See back in about 3 weeks to recheck. Overall I am happy with his progress. Continue with CV risk factor modification.   2. Possible thymoma - he has follow up with EBG tomorrow for further discussion/plan of care.   3. Persistent AF - he has had clipping of LA - no plans to try and restore NSR - he will remain on anticoagulation. Rate is controlled. Now on Lopressor.   4. Chronic anticoagulation - he is back on Eliquis along with baby aspirin - has had LA clipping - recheck his lab today. No problems noted.   5. HTN - BP now soft - stopping Lasix and potassium. Cutting ARB back to just once a day dosing. They will monitor.   6. HLD - on statin therapy - will continue.   7. Lung nodule - needs repeat scan in 6/21 - but may be influenced by recent findings of the possible thymoma - will need to follow.   8. OA - back using some Ultram.   9. COVID-19 Education: The signs and symptoms of COVID-19 were  discussed with the patient and how to seek care for testing (follow up with PCP or arrange E-visit).  The importance of social distancing, staying at home, hand hygiene and wearing a mask when out in public were discussed today.  Current medicines are reviewed with the patient today.  The patient does not have concerns regarding medicines other than what has been noted above.  The following changes have been made:  See above.  Labs/ tests ordered today include:    Orders Placed This Encounter  Procedures  . Basic metabolic panel  . CBC  . EKG 12-Lead     Disposition:   FU with me in about 3 weeks.   Patient is agreeable to this plan and will call if any problems develop in the interim.   SignedTruitt Merle, NP  06/25/2019 12:28 PM  [facility] [address] [place], [place]  [address] Phone: [phone] Fax: [phone]

## 2019-06-19 ENCOUNTER — Telehealth: Payer: Self-pay

## 2019-06-19 DIAGNOSIS — G8918 Other acute postprocedural pain: Secondary | ICD-10-CM

## 2019-06-19 MED ORDER — TRAMADOL HCL 50 MG PO TABS: 50.0000 mg | ORAL_TABLET | Freq: Four times a day (QID) | ORAL | 0 refills | Status: DC | PRN

## 2019-06-19 NOTE — Telephone Encounter (Signed)
RX for Tramadol 50 mg called to [facility]patient is aware

## 2019-06-19 NOTE — Telephone Encounter (Signed)
-----   Message from Grace Isaac, MD sent at 06/19/2019 10:58 AM EST ----- Regarding: RE: pain med Can renew ultram ----- Message ----- From: Marylen Ponto, LPN Sent: [id number]  579FGE AM EST To: Grace Isaac, MD Subject: pain med                                       [name] is calling for a refill on his pain medication OXycodone 5 mg He is scheduled to see you on 06/26/19 Please advise [name]

## 2019-06-25 ENCOUNTER — Ambulatory Visit: Payer: Medicare Other | Admitting: Nurse Practitioner

## 2019-06-25 ENCOUNTER — Other Ambulatory Visit: Payer: Self-pay

## 2019-06-25 ENCOUNTER — Encounter: Payer: Self-pay | Admitting: Nurse Practitioner

## 2019-06-25 ENCOUNTER — Other Ambulatory Visit: Payer: Self-pay | Admitting: Cardiothoracic Surgery

## 2019-06-25 VITALS — BP 96/62 | HR 73 | Ht 72.0 in | Wt 220.0 lb

## 2019-06-25 DIAGNOSIS — Z7901 Long term (current) use of anticoagulants: Secondary | ICD-10-CM

## 2019-06-25 DIAGNOSIS — I259 Chronic ischemic heart disease, unspecified: Secondary | ICD-10-CM | POA: Diagnosis not present

## 2019-06-25 DIAGNOSIS — Z951 Presence of aortocoronary bypass graft: Secondary | ICD-10-CM | POA: Diagnosis not present

## 2019-06-25 DIAGNOSIS — I10 Essential (primary) hypertension: Secondary | ICD-10-CM | POA: Diagnosis not present

## 2019-06-25 DIAGNOSIS — Z7189 Other specified counseling: Secondary | ICD-10-CM

## 2019-06-25 DIAGNOSIS — E785 Hyperlipidemia, unspecified: Secondary | ICD-10-CM | POA: Diagnosis not present

## 2019-06-25 NOTE — Progress Notes (Signed)
[address]       [place] [address]             [phone]      Tylon A Wolter [place] Medical Record [id number] Date of Birth: 06-Dec-1945  Referring: Burtis Junes, NP Primary Care: Leanna Battles, MD Primary Cardiologist: No primary care provider on file.   Chief Complaint:   POST OP FOLLOW UP OPERATIVE REPORT DATE OF PROCEDURE:  05/27/2019 PREOPERATIVE DIAGNOSIS:  Coronary occlusive disease with refractory angina. POSTOPERATIVE DIAGNOSES:  Coronary occlusive disease with refractory angina, anterior mediastinal mass. PROCEDURE PERFORMED: 1 Coronary artery bypass grafting x3 with the left internal mammary to the left anterior descending coronary artery, reverse saphenous vein graft to the diagonal coronary artery, reverse saphenous vein graft to the distal right  coronary artery with right greater saphenous thigh endoscopic vein harvesting 2. Placement of Atriclip- Left Atrial Clip 45 mm  3. Sampling of left internal mammary lymph node 4. 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  History of Present Illness:     Patient returns to the office today for postop visit following coronary artery bypass grafting January 12, at the time of surgery and incidental 3 cm left superior anterior mediastinal mass was resected- Patient is making good progress postoperatively increasing physical activity appropriately. His losartan dose was decreased because of low blood pressure on his cardiology visit, feels better now     Past Medical History:  Diagnosis Date  . A-fib ([place])   . Arrhythmia    hx. Atrial Fib. ,"with regurgitation"  . Depression   . Diabetes mellitus   . Dysrhythmia    a. fib-on Eliquis  . Heart murmur   . Hx of echocardiogram    Echo 3/16:  Mild LVH, EF 60-65%, mild MR, severe LAE, mild RAE, PASP 32 mmHg  . Hypercholesteremia   . Hypertension   . Macular degeneration of both eyes   . OSA on CPAP   . Osteoarthritis  (arthritis due to wear and tear of joints)   . Palpitations   . Prostate cancer ([place])    seed implantion- 15 to 20 yrs ago  . Sleep apnea    uses CPAP-settings 5  . 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 Ureter, stricture    hx. 2 yrs ago-stent has been removed now     Social History   Tobacco Use  Smoking Status Former Smoker  . Quit date: 05/15/1973  . Years since quitting: 46.1  Smokeless Tobacco Never Used    Social History   Substance and Sexual Activity  Alcohol Use Yes   Comment: "2 cocktails twice a week"     Allergies  Allergen Reactions  . Lisinopril Cough  . Sulfa Drugs Cross Reactors     CHILDHOOD UNSPECIFIED REACTION   . Contrast Media [Iodinated Diagnostic Agents] Rash  . Doxycycline Itching  . Ioxaglate Rash  . Tetracyclines & Related Rash    Current Outpatient Medications  Medication Sig Dispense Refill  . acetaminophen (TYLENOL) 325 MG tablet Take 2 tablets (650 mg total) by mouth every 6 (six) hours as needed for mild pain.    Marland Kitchen allopurinol (ZYLOPRIM) 300 MG tablet Take 300 mg by mouth daily.    Marland Kitchen aspirin EC 81 MG EC tablet Take 1 tablet (81 mg total) by mouth daily. 30 tablet 0  . atorvastatin (LIPITOR) 40 MG tablet Take 1 tablet (40 mg total) by mouth daily at 6 PM.  30 tablet 1  . ELIQUIS 5 MG TABS tablet TAKE 1 TABLET BY MOUTH TWO  TIMES DAILY 180 tablet 2  . losartan (COZAAR) 50 MG tablet Take 50 mg by mouth daily.    . metFORMIN (GLUCOPHAGE) 1000 MG tablet Take 500 mg by mouth daily with breakfast.    . metoprolol tartrate (LOPRESSOR) 25 MG tablet Take 1 tablet (25 mg total) by mouth 2 (two) times daily. 60 tablet 1  . Multiple Vitamin (MULTIVITAMIN WITH MINERALS) TABS tablet Take 1 tablet by mouth daily. Centrum Silver    . Multiple Vitamins-Minerals (PRESERVISION AREDS 2) CAPS Take 1 capsule by mouth 2 (two) times daily.     . traMADol (ULTRAM) 50 MG tablet Take 1 tablet (50 mg total) by mouth every 6 (six) hours as needed. 28 tablet 0   No  current facility-administered medications for this visit.       Physical Exam: BP 124/83 (BP Location: Left Arm, Patient Position: Sitting, Cuff Size: Normal)   Pulse 80   Temp (!) 97.5 F (36.4 C) (Temporal)   Resp 20   Ht 6' (1.829 m)   Wt 221 lb (100.2 kg)   SpO2 99% Comment: RA  BMI 29.97 kg/m   General appearance: alert, cooperative and no distress Neurologic: intact Heart: irregularly irregular rhythm Lungs: clear to auscultation bilaterally Abdomen: soft, non-tender; bowel sounds normal; no masses,  no organomegaly Extremities: extremities normal, atraumatic, no cyanosis or edema and Homans sign is negative, no sign of DVT Wound: well healed sternum stable   Diagnostic Studies & Laboratory data:     Recent Radiology Findings:  DG Chest 2 View  Result Date: 06/26/2019 CLINICAL DATA:  Status post coronary bypass grafting with incision pain EXAM: CHEST - 2 VIEW COMPARISON:  05/30/2019 FINDINGS: Cardiac shadow is stable. Postsurgical changes are again noted. Aortic calcifications are seen. Lungs are well aerated bilaterally without focal infiltrate or sizable effusion. No acute bony abnormality is noted. IMPRESSION: Postsurgical change without acute abnormality. Electronically Signed   By: Inez Catalina M.D.   On: 06/26/2019 14:39    I have independently reviewed the above radiology studies  and reviewed the findings with the patient.    Recent Lab Findings: Lab Results  Component Value Date   WBC 6.0 06/25/2019   HGB 13.0 06/25/2019   HCT 38.7 06/25/2019   PLT 163 06/25/2019   GLUCOSE 97 06/25/2019   CHOL 137 10/28/2018   TRIG 215 (H) 10/28/2018   HDL 33 (L) 10/28/2018   LDLCALC 61 10/28/2018   ALT 29 05/23/2019   AST 32 05/23/2019   NA 144 06/25/2019   K 4.2 06/25/2019   CL 109 (H) 06/25/2019   CREATININE 1.05 06/25/2019   BUN 23 06/25/2019   CO2 20 06/25/2019   INR 1.6 (H) 05/27/2019   HGBA1C 5.5 05/23/2019   SURGICAL PATHOLOGY   patient returns to  the office* THIS IS AN AMENDED REPORT CASE: [id number]  PATIENT: [facility]  Surgical Pathology Report   Amendment    Reason for Amendment #1: Other  Reason for Amendment #2: Additional clinical/test information   Clinical History: CAD, AFIB (cm)      FINAL MICROSCOPIC DIAGNOSIS:   A. LYMPH NODE, LEFT INTERNAL MAMMARY, BIOPSY:  - Lymph node, benign.   B. MEDIASTINAL MASS, LEFT ANTERIOR, EXCISION:  - Type AB thymoma, 3.1 cm; Please see oncology table.   ONCOLOGY TABLE:   THYMUS:   Procedure: Thymectomy  Tumor Size: 3.1 cm  Histologic Type: Type  AB thymoma  Transcapsular Invasion: Absent  Tumor Extension: Tumor confined to thymus  Margins: Equivocal (See Amendment Note #1)  Treatment Effect: No known presurgical therapy  Lymphovascular Invasion: Not identified  Regional Lymph Nodes:    Number of Lymph Nodes Involved: 0    Number of Lymph Nodes Examined: 1  Pathologic Stage Classification (pTNM, AJCC 8th Edition): pT1a, pN0 (See  Amendment Note #2)  Ancillary Studies: Can be performed up request  Representative Tumor Block: B2  Comment: Dr. Tresa Moore concurs with the histologic type.  (v4.0.0.1)   AMENDMENT NOTE #1: DIAGNOSTIC INFORMATION HAS NOT BEEN CHANGED. The  originating pathologist considered the black inked margin uninvolved by  tumor. Upon retrospective review for conference, the reviewing  pathologist considered the black inked margin focally involved by tumor.  It is acknowledged that interobserver variability can occur in this  setting, and as such, the margin status is deemed equivocal. The final  report should read Margins: Equivocal INSTEAD OF Margins: Uninvolved by  tumor. This has been corrected.   AMENDMENT NOTE #2: DIAGNOSTIC INFORMATION HAS NOT BEEN CHANGED.  Additional clinical information obtained regarding specimen A/left  internal mammary lymph node indicates that it should be included in the  pathologic stage classification.  The final report should read pN0  INSTEAD OF pNX. This has been corrected.   Case discussed with Dr. Lanelle Bal on 06/06/2019.   INTRAOPERATIVE DIAGNOSIS:   B. Left anterior mediastinal mass: "Malignant neoplasm-differential  diagnosis includes thymoma and carcinoma. Defer to permanent sections  for final diagnosis."  Intraoperative diagnosis rendered by Dr. Vic Ripper at 11:39 AM on  05/27/2019.   GROSS DESCRIPTION:   A: Received fresh is a 0.7 cm tan-pink lymph node. A portion is placed  in RPMI. The remainder is entirely submitted in 1 cassette.   B: Received fresh for rapid intraoperative consult is a 3.1 x 2.7 x 1.8  cm tan-red ovoid nodule which grossly appears encapsulated. The cut  surfaces are solid and tan-pink to pale red. A section is submitted for  frozen section. The remainder of the specimen is entirely submitted in  4 cassettes.  1 = tissue submitted for frozen section.  2-5 = remainder of specimen (GRP 05/27/2019).    Final Diagnosis performed by Gillie Manners, MD.  Electronically  signed 05/29/2019  Amendment #1 performed by Gillie Manners, MD.  Electronically signed  06/04/2019  Amendment #2 performed by Gillie Manners, MD.  Electronically signed  06/09/2019  Technical and / or Professional components performed at [facility]. [facility], [place] [address] [place], [place] [address].  Immunohistochemistry Technical component (if applicable) was performed  at [facility]. [address] [place],  [place], [place] [address].  IMMUNOHISTOCHEMISTRY DISCLAIMER (if applicable):  Some of these immunohistochemical stains may have been developed and the  performance characteristics determine by [facility]. Some  may not have been cleared or approved by the U.S. Food and Drug  Administration. The FDA has determined that such clearance or approval  is not necessary. This test is used for clinical purposes.  It should not  be regarded as investigational or for research. This laboratory is  certified under the [place]  (CLIA-88) as qualified to perform high complexity clinical laboratory  testing. The controls stained appropriately. Path:    Assessment / Plan:      #1 patient stable making good progress following coronary artery bypass grafting-he will start cardiac rehab as facilities then Covid  allow, he continues on anticoagulation for chronic atrial fibrillation, and atrial clip was placed at the time of surgery. #2 I reviewed with he and his wife the pathologic findings of thymoma-clinically this was not invasive-encapsulated and easily resected.  Follow-up pathology shows equivocal microscopic margin-we will have him discuss with radiation oncology pros and cons of postop radiation type AB thymoma.   Plan see the patient back in approximately 4 weeks for postop check He was cautioned about heavy lifting, was allowed to return to driving Encouraged to start in the cardiac rehab program.  Medication Changes: No orders of the defined types were placed in this encounter.     Grace Isaac MD      [place] [place] [address] Office [phone]     06/29/2019 5:34 PM

## 2019-06-25 NOTE — Patient Instructions (Addendum)
After Visit Summary:  We will be checking the following labs today - BMET & CBC   Medication Instructions:    Continue with your current medicines. BUT   I am stopping the potassium  I am stopping the Furosemide (Lasix)  I am cutting back the Losartan to just one pill one time a day    If you need a refill on your cardiac medications before your next appointment, please call your pharmacy.     Testing/Procedures To Be Arranged:  N/A  Follow-Up:  See me in about 3 weeks with fasting labs   At [facility] you and your health needs are our priority.  As part of our continuing mission to provide you with exceptional heart care, we have created designated Provider Care Teams.  These Care Teams include your primary Cardiologist (physician) and Advanced Practice Providers (APPs -  Physician Assistants and Nurse Practitioners) who all work together to provide you with the care you need, when you need it.  Special Instructions:  . Stay safe, stay home, wash your hands for at least 20 seconds and wear a mask when out in public.  . It was good to talk with you today.  Marland Kitchen Keep a check on BP for me - call me if it is staying below 110 consistently.    Call the [place] office at [phone] if you have any questions, problems or concerns.

## 2019-06-26 ENCOUNTER — Ambulatory Visit
Admission: RE | Admit: 2019-06-26 | Discharge: 2019-06-26 | Disposition: A | Discharge status: Home / Self Care | Payer: Medicare Other | Source: Ambulatory Visit | Attending: Cardiothoracic Surgery | Admitting: Cardiothoracic Surgery

## 2019-06-26 ENCOUNTER — Ambulatory Visit (INDEPENDENT_AMBULATORY_CARE_PROVIDER_SITE_OTHER): Payer: Self-pay | Admitting: Cardiothoracic Surgery

## 2019-06-26 VITALS — BP 124/83 | HR 80 | Temp 97.5°F | Resp 20 | Ht 72.0 in | Wt 221.0 lb

## 2019-06-26 DIAGNOSIS — Z09 Encounter for follow-up examination after completed treatment for conditions other than malignant neoplasm: Secondary | ICD-10-CM

## 2019-06-26 DIAGNOSIS — J9859 Other diseases of mediastinum, not elsewhere classified: Secondary | ICD-10-CM

## 2019-06-26 DIAGNOSIS — Z951 Presence of aortocoronary bypass graft: Secondary | ICD-10-CM

## 2019-06-26 DIAGNOSIS — I2510 Atherosclerotic heart disease of native coronary artery without angina pectoris: Secondary | ICD-10-CM

## 2019-06-26 LAB — CBC
Hematocrit: 38.7 % (ref 37.5–51.0)
Hemoglobin: 13 g/dL (ref 13.0–17.7)
MCH: 30.4 pg (ref 26.6–33.0)
MCHC: 33.6 g/dL (ref 31.5–35.7)
MCV: 91 fL (ref 79–97)
Platelets: 163 10*3/uL (ref 150–450)
RBC: 4.27 x10E6/uL (ref 4.14–5.80)
RDW: 12.5 % (ref 11.6–15.4)
WBC: 6 10*3/uL (ref 3.4–10.8)

## 2019-06-26 LAB — BASIC METABOLIC PANEL
BUN/Creatinine Ratio: 22 (ref 10–24)
BUN: 23 mg/dL (ref 8–27)
CO2: 20 mmol/L (ref 20–29)
Calcium: 9 mg/dL (ref 8.6–10.2)
Chloride: 109 mmol/L — ABNORMAL HIGH (ref 96–106)
Creatinine, Ser: 1.05 mg/dL (ref 0.76–1.27)
GFR calc Af Amer: 80 mL/min/{1.73_m2} (ref >59)
GFR calc non Af Amer: 69 mL/min/{1.73_m2} (ref >59)
Glucose: 97 mg/dL (ref 65–99)
Potassium: 4.2 mmol/L (ref 3.5–5.2)
Sodium: 144 mmol/L (ref 134–144)

## 2019-06-27 ENCOUNTER — Telehealth (HOSPITAL_COMMUNITY): Payer: Self-pay

## 2019-06-27 NOTE — Telephone Encounter (Signed)
Pt insurance is active and benefits verified through [facility] Medicare Co-pay 0, DED 0/0 met, out of pocket $3,600/$8.51 met, co-insurance 0%. no pre-authorization required. Passport, 06/27/2019@10 :19am, REF# [id number]  Will contact patient to see if he is interested in the Cardiac Rehab Program. If interested, patient will need to complete follow up appt. Once completed, patient will be contacted for scheduling upon review by the RN Navigator.

## 2019-07-09 ENCOUNTER — Other Ambulatory Visit: Payer: Self-pay | Admitting: *Deleted

## 2019-07-09 DIAGNOSIS — Z79899 Other long term (current) drug therapy: Secondary | ICD-10-CM

## 2019-07-09 DIAGNOSIS — R0602 Shortness of breath: Secondary | ICD-10-CM

## 2019-07-09 MED ORDER — FUROSEMIDE 20 MG PO TABS: 20.0000 mg | ORAL_TABLET | Freq: Every day | ORAL | 1 refills | Status: DC

## 2019-07-09 MED ORDER — POTASSIUM CHLORIDE ER 10 MEQ PO TBCR: 10.0000 meq | EXTENDED_RELEASE_TABLET | Freq: Every day | ORAL | 1 refills | Status: DC

## 2019-07-09 NOTE — Progress Notes (Signed)
CARDIOLOGY OFFICE NOTE  Date:  07/14/2019    [name] Date of Birth: [birth year] Medical Record [id number]  PCP:  Leanna Battles, MD  Cardiologist:  Servando Snare    Chief Complaint  Patient presents with  . Follow-up    History of Present Illness: [name] is a 74 y.o. male who presents today for a 3 week check. Former patient of Dr. Claris Gladden andDr. Tennant's.He basically follows with me.I see his wife as well.  He has chronic atrial fibrillation that has recurred after multiple cardioversions. Hewas previously onCoumadinand then switched over to Eliquis. He had a low risk Myoview inOctoberof 2016.He has had coronary calcifications noted on prior CT scans.He also has OSA on CPAP, hypertension, hyperlipidemia, obesity.Last CT of the chest from 10/2017 - to repeat in 2 years.   I have followed him over the past several years. He had more issues with back pain with associated HTN - we adjusted his medicines. He had his back fixed - BP came down and we were able to titrate down and even stop some of his antihypertensives. We did a telehealth visit back in June - his ACE had been changed to ARB due to cough. Otherwise was doing ok. Stable DOE.  Seen hereearlier in October- noted a new onset of exertional tightness in the his chest - coronary CT was arranged - this is abnormal with +FFR noted.Referred for cardiac cath - to try and manage medicallywith aggressive CV risk factor modificationbut ended up referring on for CABG x 3 along with LA clipping on 05/27/2019 after failing on medical therapy. Post op course was unremarkable. He was placed back on his Eliquis for his AF. He did have a possible thymoma was removed - pathology was unclear with further disposition pending. I saw him about 3 weeks ago for his follow up visit - little weak and lethargic - his wife noted depression. BP was low. I cut his ARB back to just once a day.  Had lost weight. They really changed  their diet.   The patient does not have symptoms concerning for COVID-19 infection (fever, chills, cough, or new shortness of breath).   Comes in today. Here with his wife [name]. He is not doing well. He feels like he has backslid. He notes that for the last 2 days - hard to get a deep breath. Hurts to breath. More DOE - seems worse just here lately. Got winded with just walking in here this morning. She feels he is "not breathing right". Has had fever twice - had his vaccine 2 weeks ago - this all started a week later. He has had some sweats too. Low grade 99 to 100.6. He has vomited twice - this was about 6 days later after his second vaccine. Weight is basically unchanged. No energy. He has been put back on low dose Lasix. He is to see radiation oncology later this week. Has cardiac rehab orientation this week. BP now higher at home - 150/90. He wakes up several times during the night. One night it was related to having difficulty breathing. They wonder if this is all vaccine related - or something else going on.   Past Medical History:  Diagnosis Date  . A-fib ([place])   . Arrhythmia    hx. Atrial Fib. ,"with regurgitation"  . Depression   . Diabetes mellitus   . Dysrhythmia    a. fib-on Eliquis  . Heart murmur   . Hx of echocardiogram  Echo 3/16:  Mild LVH, EF 60-65%, mild MR, severe LAE, mild RAE, PASP 32 mmHg  . Hypercholesteremia   . Hypertension   . Macular degeneration of both eyes   . OSA on CPAP   . Osteoarthritis (arthritis due to wear and tear of joints)   . Palpitations   . Prostate cancer ([place])    seed implantion- 15 to 20 yrs ago  . Sleep apnea    uses CPAP-settings 5  . 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 Ureter, stricture    hx. 2 yrs ago-stent has been removed now    Past Surgical History:  Procedure Laterality Date  . CLIPPING OF ATRIAL APPENDAGE N/A 05/27/2019   Procedure: CLIPPING OF ATRIAL APPENDAGE - Using AtriCure Clip size 45MM;  Surgeon: Grace Isaac,  MD;  Location: [place];  Service: Open Heart Surgery;  Laterality: N/A;  . COLONOSCOPY  2007  . CORONARY ARTERY BYPASS GRAFT N/A 05/27/2019   Procedure: CORONARY ARTERY BYPASS GRAFTING (CABG) x Three , using left internal mammary artery and right leg greater saphenous vein harvested endoscopically LIMA to LAD, SVG to DIAG, SVG to RCA. Incision of left internal mammary lymph node Incision of Left Internal  Mediastinal Mass;  Surgeon: Grace Isaac, MD;  Location: [facility];  Service: Open Heart Surgery;  Laterality: N/A;  . CYSTOSCOPY W/ RETROGRADES  04/04/2012   Procedure: CYSTOSCOPY WITH RETROGRADE PYELOGRAM;  Surgeon: Molli Hazard, MD;  Location: [facility];  Service: Urology;  Laterality: Bilateral;  . CYSTOSCOPY WITH RETROGRADE PYELOGRAM, URETEROSCOPY AND STENT PLACEMENT  04/04/2012   Procedure: CYSTOSCOPY WITH RETROGRADE PYELOGRAM, URETEROSCOPY AND STENT PLACEMENT;  Surgeon: Molli Hazard, MD;  Location: [facility];  Service: Urology;  Laterality: Right;  . JOINT REPLACEMENT Left    knee  . LEFT HEART CATH AND CORONARY ANGIOGRAPHY N/A 03/14/2019   Procedure: LEFT HEART CATH AND CORONARY ANGIOGRAPHY;  Surgeon: Belva Crome, MD;  Location: [facility];  Service: Cardiovascular;  Laterality: N/A;  . LUMBAR LAMINECTOMY/DECOMPRESSION MICRODISCECTOMY N/A 09/27/2017   Procedure: LAMINECTOMY LUMBAR THREE- LUMBAR FOUR, LUMBAR FOUR- LUMBAR FIVE;  Surgeon: Ashok Pall, MD;  Location: [facility];  Service: Neurosurgery;  Laterality: N/A;  . ROTATOR CUFF REPAIR Left   . seed implants    . TEE WITHOUT CARDIOVERSION N/A 05/27/2019   Procedure: TRANSESOPHAGEAL ECHOCARDIOGRAM (TEE);  Surgeon: Grace Isaac, MD;  Location: [facility];  Service: Open Heart Surgery;  Laterality: N/A;  . TOTAL KNEE ARTHROPLASTY Left 08/06/2014   Procedure: LEFT TOTAL KNEE ARTHROPLASTY;  Surgeon: Susa Day, MD;  Location: [facility];  Service: Orthopedics;  Laterality: Left;     Medications: Current Meds    Medication Sig  . acetaminophen (TYLENOL) 325 MG tablet Take 2 tablets (650 mg total) by mouth every 6 (six) hours as needed for mild pain.  Marland Kitchen allopurinol (ZYLOPRIM) 300 MG tablet Take 300 mg by mouth daily.  Marland Kitchen aspirin EC 81 MG EC tablet Take 1 tablet (81 mg total) by mouth daily.  Marland Kitchen atorvastatin (LIPITOR) 40 MG tablet Take 1 tablet (40 mg total) by mouth daily at 6 PM.  . ELIQUIS 5 MG TABS tablet TAKE 1 TABLET BY MOUTH TWO  TIMES DAILY  . furosemide (LASIX) 20 MG tablet Take 1 tablet (20 mg total) by mouth daily.  Marland Kitchen losartan (COZAAR) 50 MG tablet Take 1 tablet (50 mg total) by mouth daily.  . metFORMIN (GLUCOPHAGE) 1000 MG tablet Take 500 mg by mouth daily with breakfast.  .  metoprolol tartrate (LOPRESSOR) 25 MG tablet Take 1 tablet (25 mg total) by mouth 2 (two) times daily.  . Multiple Vitamin (MULTIVITAMIN WITH MINERALS) TABS tablet Take 1 tablet by mouth daily. Centrum Silver  . Multiple Vitamins-Minerals (PRESERVISION AREDS 2) CAPS Take 1 capsule by mouth 2 (two) times daily.   . potassium chloride (KLOR-CON) 10 MEQ tablet Take 1 tablet (10 mEq total) by mouth daily.  . traMADol (ULTRAM) 50 MG tablet Take 1 tablet (50 mg total) by mouth every 6 (six) hours as needed.  . [DISCONTINUED] losartan (COZAAR) 50 MG tablet Take 50 mg by mouth daily.     Allergies: Allergies  Allergen Reactions  . Lisinopril Cough  . Sulfa Drugs Cross Reactors     CHILDHOOD UNSPECIFIED REACTION   . Contrast Media [Iodinated Diagnostic Agents] Rash  . Doxycycline Itching  . Ioxaglate Rash  . Tetracyclines & Related Rash    Social History: The patient  reports that he quit smoking about 46 years ago. He has never used smokeless tobacco. He reports current alcohol use. He reports that he does not use drugs.   Family History: The patient's family history includes Cancer in his mother; Colon cancer in his mother; Dementia in his mother; Heart attack in his father and sister; Stroke in his sister.    Review of Systems: Please see the history of present illness.   All other systems are reviewed and negative.   Physical Exam: VS:  BP (!) 152/82   Pulse 79   Ht 6' (1.829 m)   Wt 221 lb (100.2 kg)   SpO2 97%   BMI 29.97 kg/m  .  BMI Body mass index is 29.97 kg/m.  Wt Readings from Last 3 Encounters:  07/14/19 221 lb (100.2 kg)  06/26/19 221 lb (100.2 kg)  06/25/19 220 lb (99.8 kg)    General: Alert and in no acute distress.   HEENT: Normal.  Cardiac: Irregular irregular rhythm. His rate is fine.  No edema.  Respiratory:  Lungs are fairly clear - he does have noted discomfort with taking a deep breath.   GI: Soft and nontender.  MS: No deformity or atrophy. Gait and ROM intact.  Skin: Warm and dry. Color is normal.  Neuro:  Strength and sensation are intact and no gross focal deficits noted.  Psych: Alert, appropriate and with normal affect.   LABORATORY DATA:  EKG:  EKG is not ordered today.   Lab Results  Component Value Date   WBC 6.0 06/25/2019   HGB 13.0 06/25/2019   HCT 38.7 06/25/2019   PLT 163 06/25/2019   GLUCOSE 97 06/25/2019   CHOL 137 10/28/2018   TRIG 215 (H) 10/28/2018   HDL 33 (L) 10/28/2018   LDLCALC 61 10/28/2018   ALT 29 05/23/2019   AST 32 05/23/2019   NA 144 06/25/2019   K 4.2 06/25/2019   CL 109 (H) 06/25/2019   CREATININE 1.05 06/25/2019   BUN 23 06/25/2019   CO2 20 06/25/2019   INR 1.6 (H) 05/27/2019   HGBA1C 5.5 05/23/2019        BNP (last 3 results) No results for input(s): BNP in the last 8760 hours.  ProBNP (last 3 results) No results for input(s): PROBNP in the last 8760 hours.   Other Studies Reviewed Today:   CABG 05/2019 PROCEDURE PERFORMED:1Coronary artery bypass grafting x3 with the left internal mammary to the left anterior descending coronary artery, reverse saphenous vein graft to the diagonal coronary artery, reverse saphenous vein  graft to the distal right  coronary artery with right greater saphenous  thigh endoscopic vein harvesting 2. Placement of Atriclip- Left Atrial Clip 45 mm  3. Sampling of left internal mammary lymph node 4. 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   LEFT HEART CATH AND CORONARY ANGIOGRAPHY10/30/2020  Conclusion   Moderately severe three-vessel coronary disease.  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 2 small obtuse marginal branch is supplied on the left lateral wall.  40% proximal LAD with 60 to 70% diffuse mid to distal LAD. First diagonal contains eccentric 50 to 70% narrowing.  Right coronary is diffusely diseased with mid 60 to 65% stenosis. Distal 75% stenosis before small PDA. 99% stenosis in the continuation of the right coronary before the second PLA. The acute marginal branch of the right coronary has multifocal 95% stenoses.  Low normal LVEF approximately 50% with normal EDP.  RECOMMENDATIONS:   Aggressive risk preventive therapy with LDL target less than 55.  Add long-acting nitrate therapy to improve exertional dyspnea and chest tightness.  If unacceptable symptoms, would consider multivessel coronary bypass grafting. In absence of high-grade proximal CAD, I feel a measured approach attempting to control symptoms with medications is a reasonable first option.     ASSESSMENT & PLAN:   1.CAD - now s/p CABG x 3 - slow progress - seems to be reverting - unclear if how he is feeling is related to his second vaccine - his symptoms started one week post this 2nd vaccine. We will recheck CXR today. Recheck his lab. Check for COVID as well.   2. Possible thymoma - to see radiation oncology later this week for discussion/management.  3. Persistent AF - has had clipping of the LA - no plans to restore NSR - remains on his anticoagulation. Rate is fine here today.   4. Chronic anticoagulation - no problems noted. Lab today.   5. HTN - BP is back up -  will increase his ARB back to BID.   6. HLD - on statin  7. Lung nodule - needs repeat scan in 6/21 - this may be influenced by these recent findings of the possible thymoma - will need to follow.   8. OA - back using some Ultram. This is ok to take at this time.   9. COVID-19 Education: The signs and symptoms of COVID-19 were discussed with the patient and how to seek care for testing (follow up with PCP or arrange E-visit).  The importance of social distancing, staying at home, hand hygiene and wearing a mask when out in public were discussed today.  Current medicines are reviewed with the patient today.  The patient does not have concerns regarding medicines other than what has been noted above.  The following changes have been made:  See above.  Labs/ tests ordered today include:    Orders Placed This Encounter  Procedures  . DG Chest 2 View  . Basic metabolic panel  . CBC     Disposition:   FU with me in 2 weeks.   Patient is agreeable to this plan and will call if any problems develop in the interim.   SignedTruitt Merle, NP  07/14/2019 11:36 AM  [facility] [address] [place], [place]  [address] Phone: [phone] Fax: [phone]

## 2019-07-09 NOTE — Progress Notes (Signed)
[name] called to the office this morning with concerns that he was having shortness of breath on exertion.  He seemed to be doing fine until his Lasix was stopped at his visit with cardiology recently due to low blood pressure.  I related this to Dr. Servando Snare. He is not having any lower extremity edema or lung congestion.  He does have a slight cough but he says he has always had that.  Dr. Servando Snare said he should resume his Lasix and monitor his weight.  I instructed him regarding this advice and called new prescriptions to his pharmacy.

## 2019-07-11 ENCOUNTER — Encounter (HOSPITAL_COMMUNITY)
Admission: RE | Admit: 2019-07-11 | Discharge: 2019-07-11 | Disposition: A | Discharge status: Home / Self Care | Payer: Self-pay | Source: Ambulatory Visit | Attending: Cardiothoracic Surgery | Admitting: Cardiothoracic Surgery

## 2019-07-11 ENCOUNTER — Other Ambulatory Visit: Payer: Self-pay | Admitting: Nurse Practitioner

## 2019-07-11 ENCOUNTER — Other Ambulatory Visit: Payer: Self-pay

## 2019-07-11 DIAGNOSIS — I259 Chronic ischemic heart disease, unspecified: Secondary | ICD-10-CM

## 2019-07-11 NOTE — Telephone Encounter (Signed)
Pt is requesting refill for Nitro, it was D/C at discharge. Would [facility] be ok filling this script? Please advise.

## 2019-07-11 NOTE — Progress Notes (Signed)
Cardiac Rehab Note:  Successful telephone encounter to [name] to confirm Cardiac Rehab orientation appointment for 07/15/19 at 0730. Nursing assessment completed. Instructions for appointment provided. Patient screening for Covid-19 negative. [name] expressed interest in participating in Virtual Cardiac Rehab in adjunct with his in-person exercise sessions. Verbal consent obtained and invitation to [facility] app sent via text message. (see below)  Pt is interested in participating in Virtual Cardiac and Pulmonary Rehab. Pt advised that Virtual Cardiac and Pulmonary Rehab is provided at no cost to the patient.  Checklist:  1. Pt has smart device  ie smartphone and/or ipad for downloading an app  Yes 2. Reliable internet/wifi service    Yes 3. Understands how to use their smartphone and navigate within an app.  Yes             Confirm Consent - In the setting of the current Covid19 crisis, you are scheduled for a phone visit with your Cardiac or Pulmonary team member.  Just as we do with many in-gym visits, in order for you to participate in this visit, we must obtain consent.  If you'd like, I can send this to your mychart (if signed up) or email for you to review.  Otherwise, I can obtain your verbal consent now.  By agreeing to a telephone visit, we'd like you to understand that the technology does not allow for your Cardiac or Pulmonary Rehab team member to perform a physical assessment, and thus may limit their ability to fully assess your ability to perform exercise programs. If your provider identifies any concerns that need to be evaluated in person, we will make arrangements to do so.  Finally, though the technology is pretty good, we cannot assure that it will always work on either your or our end and we cannot ensure that we have a secure connection.  Cardiac and Pulmonary Rehab Telehealth visits and "At Home" cardiac and pulmonary rehab are provided at no cost to you.   Are you willing to proceed?"        STAFF: Did the patient verbally acknowledge consent to telehealth visit? Document YES/NO here: Yes     Jesicca Dipierro E. Rollene Rotunda, RN  Cardiac and Pulmonary Rehab Staff        Date 07/11/19 @ Time [phone]

## 2019-07-14 ENCOUNTER — Telehealth (HOSPITAL_COMMUNITY): Payer: Self-pay

## 2019-07-14 ENCOUNTER — Encounter: Payer: Self-pay | Admitting: Nurse Practitioner

## 2019-07-14 ENCOUNTER — Ambulatory Visit
Admission: RE | Admit: 2019-07-14 | Discharge: 2019-07-14 | Disposition: A | Discharge status: Home / Self Care | Payer: Medicare Other | Source: Ambulatory Visit | Attending: Nurse Practitioner | Admitting: Nurse Practitioner

## 2019-07-14 ENCOUNTER — Other Ambulatory Visit: Payer: Self-pay

## 2019-07-14 ENCOUNTER — Ambulatory Visit: Payer: Medicare Other | Admitting: Nurse Practitioner

## 2019-07-14 ENCOUNTER — Ambulatory Visit: Payer: Medicare Other | Attending: Internal Medicine

## 2019-07-14 VITALS — BP 152/82 | HR 79 | Ht 72.0 in | Wt 221.0 lb

## 2019-07-14 DIAGNOSIS — R05 Cough: Secondary | ICD-10-CM

## 2019-07-14 DIAGNOSIS — Z951 Presence of aortocoronary bypass graft: Secondary | ICD-10-CM

## 2019-07-14 DIAGNOSIS — I10 Essential (primary) hypertension: Secondary | ICD-10-CM

## 2019-07-14 DIAGNOSIS — R059 Cough, unspecified: Secondary | ICD-10-CM

## 2019-07-14 DIAGNOSIS — E785 Hyperlipidemia, unspecified: Secondary | ICD-10-CM | POA: Diagnosis not present

## 2019-07-14 DIAGNOSIS — I259 Chronic ischemic heart disease, unspecified: Secondary | ICD-10-CM

## 2019-07-14 DIAGNOSIS — R0781 Pleurodynia: Secondary | ICD-10-CM

## 2019-07-14 DIAGNOSIS — R509 Fever, unspecified: Secondary | ICD-10-CM

## 2019-07-14 DIAGNOSIS — Z20822 Contact with and (suspected) exposure to covid-19: Secondary | ICD-10-CM

## 2019-07-14 LAB — CBC
Hematocrit: 39.5 % (ref 37.5–51.0)
Hemoglobin: 13.4 g/dL (ref 13.0–17.7)
MCH: 30.5 pg (ref 26.6–33.0)
MCHC: 33.9 g/dL (ref 31.5–35.7)
MCV: 90 fL (ref 79–97)
Platelets: 171 10*3/uL (ref 150–450)
RBC: 4.4 x10E6/uL (ref 4.14–5.80)
RDW: 13 % (ref 11.6–15.4)
WBC: 5.9 10*3/uL (ref 3.4–10.8)

## 2019-07-14 LAB — BASIC METABOLIC PANEL
BUN/Creatinine Ratio: 10 (ref 10–24)
BUN: 13 mg/dL (ref 8–27)
CO2: 23 mmol/L (ref 20–29)
Calcium: 9.1 mg/dL (ref 8.6–10.2)
Chloride: 99 mmol/L (ref 96–106)
Creatinine, Ser: 1.27 mg/dL (ref 0.76–1.27)
GFR calc Af Amer: 63 mL/min/{1.73_m2} (ref >59)
GFR calc non Af Amer: 55 mL/min/{1.73_m2} — ABNORMAL LOW (ref >59)
Glucose: 128 mg/dL — ABNORMAL HIGH (ref 65–99)
Potassium: 3.9 mmol/L (ref 3.5–5.2)
Sodium: 136 mmol/L (ref 134–144)

## 2019-07-14 MED ORDER — LOSARTAN POTASSIUM 50 MG PO TABS: 50.0000 mg | ORAL_TABLET | Freq: Every day | ORAL | 3 refills | Status: DC

## 2019-07-14 NOTE — Patient Instructions (Addendum)
After Visit Summary:  We will be checking the following labs today - BMET and CBC  Please go to [facility] to [place] on the first floor for a chest Xray - you may walk in.   COVID test today.     Medication Instructions:    Continue with your current medicines. BUT  I am increasing the Losartan back to 50 mg to take TWICE a day   If you need a refill on your cardiac medications before your next appointment, please call your pharmacy.     Testing/Procedures To Be Arranged:  N/A  Follow-Up:   See me in about 2 weeks    At [facility], you and your health needs are our priority.  As part of our continuing mission to provide you with exceptional heart care, we have created designated Provider Care Teams.  These Care Teams include your primary Cardiologist (physician) and Advanced Practice Providers (APPs -  Physician Assistants and Nurse Practitioners) who all work together to provide you with the care you need, when you need it.  Special Instructions:  . Stay safe, stay home, wash your hands for at least 20 seconds and wear a mask when out in public.  . It was good to talk with you both today.    Call the [place] office at [phone] if you have any questions, problems or concerns.

## 2019-07-15 ENCOUNTER — Ambulatory Visit (HOSPITAL_COMMUNITY): Payer: Medicare Other

## 2019-07-15 LAB — NOVEL CORONAVIRUS, NAA: SARS-CoV-2, NAA: NOT DETECTED

## 2019-07-16 ENCOUNTER — Ambulatory Visit: Payer: Medicare Other | Admitting: Radiation Oncology

## 2019-07-16 ENCOUNTER — Telehealth: Payer: Self-pay | Admitting: *Deleted

## 2019-07-16 ENCOUNTER — Ambulatory Visit: Payer: Medicare Other

## 2019-07-16 ENCOUNTER — Other Ambulatory Visit: Payer: Self-pay | Admitting: Nurse Practitioner

## 2019-07-16 DIAGNOSIS — R509 Fever, unspecified: Secondary | ICD-10-CM

## 2019-07-16 DIAGNOSIS — R0781 Pleurodynia: Secondary | ICD-10-CM

## 2019-07-16 DIAGNOSIS — R0600 Dyspnea, unspecified: Secondary | ICD-10-CM

## 2019-07-16 DIAGNOSIS — R05 Cough: Secondary | ICD-10-CM

## 2019-07-16 DIAGNOSIS — Z951 Presence of aortocoronary bypass graft: Secondary | ICD-10-CM

## 2019-07-16 DIAGNOSIS — R059 Cough, unspecified: Secondary | ICD-10-CM

## 2019-07-16 NOTE — Telephone Encounter (Signed)
Pt calling in today to get results from [facility] was not detected.  Pt is still having SOB,  pt's main complaint. Will send to [name] to advise.

## 2019-07-16 NOTE — Telephone Encounter (Signed)
S/w pt will have a sed rate along with echo tomorrow.  Pt is aware and agreeable to treatment plan.  Appt made, orders in and linked.

## 2019-07-16 NOTE — Telephone Encounter (Signed)
Let's get a limited echo on him and make sure everything is ok.   [name]

## 2019-07-16 NOTE — Telephone Encounter (Signed)
S/w pt is aware of Lori's recommendations.  Pt will have limited echo tomorrow, march 4.  Appt made, orders placed in system and linked. Sent to precert.

## 2019-07-17 ENCOUNTER — Ambulatory Visit (HOSPITAL_COMMUNITY): Payer: Medicare Other | Attending: Cardiology

## 2019-07-17 ENCOUNTER — Other Ambulatory Visit: Payer: Self-pay

## 2019-07-17 ENCOUNTER — Other Ambulatory Visit: Payer: Medicare Other | Admitting: *Deleted

## 2019-07-17 DIAGNOSIS — R059 Cough, unspecified: Secondary | ICD-10-CM

## 2019-07-17 DIAGNOSIS — Z951 Presence of aortocoronary bypass graft: Secondary | ICD-10-CM | POA: Diagnosis not present

## 2019-07-17 DIAGNOSIS — R509 Fever, unspecified: Secondary | ICD-10-CM

## 2019-07-17 DIAGNOSIS — R0600 Dyspnea, unspecified: Secondary | ICD-10-CM

## 2019-07-17 DIAGNOSIS — I340 Nonrheumatic mitral (valve) insufficiency: Secondary | ICD-10-CM

## 2019-07-17 DIAGNOSIS — R0781 Pleurodynia: Secondary | ICD-10-CM

## 2019-07-17 DIAGNOSIS — R05 Cough: Secondary | ICD-10-CM

## 2019-07-17 NOTE — Telephone Encounter (Signed)
Patient wife called and stated that he was interested in participating in the Cardiac Rehab Program. Patient stated yes. Patient will come in for orientation on 07/29/2019@7 :30am and will attend the 7:15am exercise class.  Mailed homework package.

## 2019-07-18 ENCOUNTER — Telehealth: Payer: Self-pay

## 2019-07-18 LAB — SEDIMENTATION RATE: Sed Rate: 6 mm/hr (ref 0–30)

## 2019-07-18 NOTE — Progress Notes (Unsigned)
The patient has been notified of the result and verbalized understanding.  All questions (if any) were answered. Wilma Flavin, RN 07/18/2019 10:01 AM

## 2019-07-18 NOTE — Telephone Encounter (Signed)
The patient has been notified of the result and verbalized understanding.  All questions (if any) were answered. Wilma Flavin, RN 07/18/2019 10:01 AM

## 2019-07-18 NOTE — Telephone Encounter (Signed)
-----   Message from Burtis Junes, NP sent at 07/18/2019  9:55 AM EST ----- Ok to report. Sed rate is normal - see the echo result for plan of care.

## 2019-07-18 NOTE — Telephone Encounter (Signed)
The patient has been notified of the result and verbalized understanding.  All questions (if any) were answered. Wilma Flavin, RN 07/18/2019 10:02 AM

## 2019-07-18 NOTE — Telephone Encounter (Signed)
-----   Message from Burtis Junes, NP sent at 07/18/2019  9:54 AM EST ----- Ok to report. Sed rate to follow - but it was totally normal. The echo is stable - normal pumping function - this is good. Some leakage of his valves - I do not see anything worrisome that would be causing his current symptoms. Would stay on Lasix thru the weekend - call us on Tuesday with an update please.  Monitor for fever.  Hopefully this will improve if this was all vaccine related.

## 2019-07-21 ENCOUNTER — Ambulatory Visit (HOSPITAL_COMMUNITY): Payer: Medicare Other

## 2019-07-22 NOTE — Progress Notes (Signed)
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 Diagnoses of Chronic atrial fibrillation ([place]), Angina pectoris ([place]), and Prostate cancer [facility]) were also pertinent to this visit.  Stage I (pT1a, pN0) Thymoma, Type AB  HISTORY OF PRESENT ILLNESS::[name] is a 74 y.o. male who is accompanied by his wife. The patient has a history of chronic atrial fibrillation. He presented to his cardiologist with chest discomfort and shortness of breath. He underwent coronary CT on 03/03/2019, which showed severe stenosis in three different places. He increased his Imdur dose, but his symptoms persisted. He was referred to Dr. Servando Snare on 04/30/2019 to discuss CABG.  He was taken for CABG on 05/27/2019. During the procedure, he was found to have a 3 cm anterior left superior mediastinal mass, as well as an enlarged left internal mammary lymph node. Pathology from the procedure showed a 3.1 cm type AB thymoma. The lymph node was benign.  Initial reports were that the surgical margins were clear. However, Presentation at the multidisciplinary thoracic oncology conference there was a question of whether there was focal margin involvement.  There was no evidence of capsular invasion or lymphovascular space invasion.  Intraoperatively the tumor was not adherent to surrounding structures and was easily removed.  Patient is now seen in radiation oncology for consideration for postoperative adjuvant treatment   PREVIOUS RADIATION THERAPY: Yes Prostate Seed Implant, patient is scheduled for PSA check later today and follow-up with his urologist.  Prostate seed implant was several years ago with Dr. Danny Lawless and Dr.  Terance Hart  PAST MEDICAL HISTORY:  Past Medical History:  Diagnosis Date  . A-fib ([place])   . Arrhythmia    hx. Atrial Fib. ,"with regurgitation"  . Depression   . Diabetes mellitus   . Dysrhythmia    a. fib-on Eliquis  . Heart murmur   . Hx of echocardiogram    Echo 3/16:  Mild LVH, EF 60-65%, mild MR, severe LAE, mild RAE, PASP 32 mmHg  . Hypercholesteremia   . Hypertension   . Macular degeneration of both eyes   . OSA on CPAP   . Osteoarthritis (arthritis due to wear and tear of joints)   . Palpitations   . Prostate cancer ([place])    seed implantion- 15 to 20 yrs ago  . Sleep apnea    uses CPAP-settings 5  . 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 Ureter, stricture    hx. 2 yrs ago-stent has been removed now    PAST SURGICAL HISTORY: Past Surgical History:  Procedure Laterality Date  . CLIPPING OF ATRIAL APPENDAGE N/A 05/27/2019   Procedure: CLIPPING OF ATRIAL APPENDAGE - Using AtriCure Clip size 45MM;  Surgeon: Grace Isaac, MD;  Location: [place];  Service: Open Heart Surgery;  Laterality: N/A;  . COLONOSCOPY  2007  . CORONARY ARTERY BYPASS GRAFT N/A 05/27/2019   Procedure: CORONARY ARTERY BYPASS GRAFTING (CABG) x Three , using left internal mammary artery and right leg greater saphenous vein harvested endoscopically LIMA to LAD, SVG to DIAG, SVG to RCA. Incision of left internal mammary lymph node Incision of Left Internal  Mediastinal Mass;  Surgeon: Grace Isaac, MD;  Location: [place];  Service: Open Heart Surgery;  Laterality: N/A;  . CYSTOSCOPY W/ RETROGRADES  04/04/2012   Procedure: CYSTOSCOPY WITH RETROGRADE PYELOGRAM;  Surgeon: Molli Hazard, MD;  Location: [facility];  Service: Urology;  Laterality: Bilateral;  . CYSTOSCOPY WITH RETROGRADE PYELOGRAM, URETEROSCOPY AND STENT PLACEMENT  04/04/2012   Procedure: CYSTOSCOPY WITH RETROGRADE PYELOGRAM, URETEROSCOPY AND STENT PLACEMENT;  Surgeon: Molli Hazard, MD;  Location: [facility];  Service: Urology;   Laterality: Right;  . JOINT REPLACEMENT Left    knee  . LEFT HEART CATH AND CORONARY ANGIOGRAPHY N/A 03/14/2019   Procedure: LEFT HEART CATH AND CORONARY ANGIOGRAPHY;  Surgeon: Belva Crome, MD;  Location: [place] [facility];  Service: Cardiovascular;  Laterality: N/A;  . LUMBAR LAMINECTOMY/DECOMPRESSION MICRODISCECTOMY N/A 09/27/2017   Procedure: LAMINECTOMY LUMBAR THREE- LUMBAR FOUR, LUMBAR FOUR- LUMBAR FIVE;  Surgeon: Ashok Pall, MD;  Location: [place];  Service: Neurosurgery;  Laterality: N/A;  . ROTATOR CUFF REPAIR Left   . seed implants    . TEE WITHOUT CARDIOVERSION N/A 05/27/2019   Procedure: TRANSESOPHAGEAL ECHOCARDIOGRAM (TEE);  Surgeon: Grace Isaac, MD;  Location: [place];  Service: Open Heart Surgery;  Laterality: N/A;  . TOTAL KNEE ARTHROPLASTY Left 08/06/2014   Procedure: LEFT TOTAL KNEE ARTHROPLASTY;  Surgeon: Susa Day, MD;  Location: [facility];  Service: Orthopedics;  Laterality: Left;    FAMILY HISTORY:  Family History  Problem Relation Age of Onset  . Cancer Mother   . Dementia Mother   . Colon cancer Mother   . Heart attack Sister   . Stroke Sister   . Heart attack Father   . Esophageal cancer Neg Hx   . Rectal cancer Neg Hx   . Stomach cancer Neg Hx     SOCIAL HISTORY:  Social History   Tobacco Use  . Smoking status: Former Smoker    Quit date: 05/15/1973    Years since quitting: 46.2  . Smokeless tobacco: Never Used  Substance Use Topics  . Alcohol use: Yes    Comment: "2 cocktails twice a week"  . Drug use: No    ALLERGIES:  Allergies  Allergen Reactions  . Lisinopril Cough  . Sulfa Drugs Cross Reactors     CHILDHOOD UNSPECIFIED REACTION   . Contrast Media [Iodinated Diagnostic Agents] Rash  . Doxycycline Itching  . Ioxaglate Rash  . Tetracyclines & Related Rash    MEDICATIONS:  Current Outpatient Medications  Medication Sig Dispense Refill  . acetaminophen (TYLENOL) 325 MG tablet Take 2 tablets (650 mg total) by mouth every 6 (six)  hours as needed for mild pain.    Marland Kitchen allopurinol (ZYLOPRIM) 300 MG tablet Take 300 mg by mouth daily.    Marland Kitchen aspirin EC 81 MG EC tablet Take 1 tablet (81 mg total) by mouth daily. 30 tablet 0  . atorvastatin (LIPITOR) 40 MG tablet Take 1 tablet (40 mg total) by mouth daily at 6 PM. 30 tablet 1  . ELIQUIS 5 MG TABS tablet TAKE 1 TABLET BY MOUTH TWO  TIMES DAILY 180 tablet 2  . furosemide (LASIX) 20 MG tablet Take 1 tablet (20 mg total) by mouth daily. 30 tablet 1  . losartan (COZAAR) 50 MG tablet Take 1 tablet (50 mg total) by mouth daily. (Patient taking differently: Take 50 mg by mouth 2 (two) times daily. ) 180 tablet 3  . metFORMIN (GLUCOPHAGE) 1000 MG tablet Take 500 mg by mouth daily with breakfast.    . metoprolol tartrate (LOPRESSOR) 25 MG tablet Take 1 tablet (25 mg total)  by mouth 2 (two) times daily. 60 tablet 1  . Multiple Vitamin (MULTIVITAMIN WITH MINERALS) TABS tablet Take 1 tablet by mouth daily. Centrum Silver    . Multiple Vitamins-Minerals (PRESERVISION AREDS 2) CAPS Take 1 capsule by mouth 2 (two) times daily.     . potassium chloride (KLOR-CON) 10 MEQ tablet Take 1 tablet (10 mEq total) by mouth daily. 30 tablet 1  . traMADol (ULTRAM) 50 MG tablet Take 1 tablet (50 mg total) by mouth every 6 (six) hours as needed. 28 tablet 0   No current facility-administered medications for this encounter.    REVIEW OF SYSTEMS: REVIEW OF SYSTEMS: A 10+ POINT REVIEW OF SYSTEMS WAS OBTAINED including neurology, dermatology, psychiatry, cardiac, respiratory, lymph, extremities, GI, GU, musculoskeletal, constitutional, reproductive, HEENT. All pertinent positives are noted in the HPI. All others are negative.  Patient's cardiac associated chest pain is improved.  His dyspnea with exertion is also improved.  He continues to have some fatigue since his operation however.  He has soreness along his sternum area but no significant pain   PHYSICAL EXAM:  height is 6' (1.829 m) and weight is 220 lb 6 oz  (100 kg). His temporal temperature is 99.1 F (37.3 C). His blood pressure is 130/81 and his pulse is 65. His respiration is 18 and oxygen saturation is 99%.   General: Alert and oriented, in no acute distress HEENT: Head is normocephalic. Extraocular movements are intact.  Neck: Neck is supple, no palpable cervical or supraclavicular lymphadenopathy. Heart: irregular rhythm consistent with atrial fibrillation Chest: Clear to auscultation bilaterally, with no rhonchi, wheezes, or rales.  Median sternotomy scar healing well without signs of drainage or infection. Abdomen: Soft, nontender, nondistended, with no rigidity or guarding. Extremities: No cyanosis or edema. Lymphatics: see Neck Exam Skin: No concerning lesions. Musculoskeletal: symmetric strength and muscle tone throughout. Neurologic: Cranial nerves II through XII are grossly intact. No obvious focalities. Speech is fluent. Coordination is intact. Psychiatric: Judgment and insight are intact. Affect is appropriate.   ECOG = 1  0 - Asymptomatic (Fully active, able to carry on all predisease activities without restriction)  1 - Symptomatic but completely ambulatory (Restricted in physically strenuous activity but ambulatory and able to carry out work of a light or sedentary nature. For example, light housework, office work)  2 - Symptomatic, <50% in bed during the day (Ambulatory and capable of all self care but unable to carry out any work activities. Up and about more than 50% of waking hours)  3 - Symptomatic, >50% in bed, but not bedbound (Capable of only limited self-care, confined to bed or chair 50% or more of waking hours)  4 - Bedbound (Completely disabled. Cannot carry on any self-care. Totally confined to bed or chair)  5 - Death   Eustace Pen MM, Creech RH, Tormey DC, et al. [phone]). "Toxicity and response criteria of the [facility]". [place] Oncol. 5 (6): 649-55  LABORATORY DATA:  Lab Results   Component Value Date   WBC 5.9 07/14/2019   HGB 13.4 07/14/2019   HCT 39.5 07/14/2019   MCV 90 07/14/2019   PLT 171 07/14/2019   NEUTROABS 6.0 09/21/2013   Lab Results  Component Value Date   NA 136 07/14/2019   K 3.9 07/14/2019   CL 99 07/14/2019   CO2 23 07/14/2019   GLUCOSE 128 (H) 07/14/2019   CREATININE 1.27 07/14/2019   CALCIUM 9.1 07/14/2019      RADIOGRAPHY: DG Chest 2  View  Result Date: 07/14/2019 CLINICAL DATA:  Shortness of breath, post CABG and clipping of atrial appendage EXAM: CHEST - 2 VIEW COMPARISON:  06/26/2019 FINDINGS: Enlargement of cardiac silhouette post CABG and LEFT atrial appendage clipping. Mediastinal contours and pulmonary vascularity normal. Atherosclerotic calcification aorta. Tiny bibasilar pleural effusions and minimal atelectasis. Remaining lungs clear. No pneumothorax or acute osseous findings. IMPRESSION: Enlargement of cardiac silhouette post CABG and LEFT atrial appendage clipping. Tiny bibasilar pleural effusions and minimal atelectasis. Electronically Signed   By: Lavonia Dana M.D.   On: 07/14/2019 12:34   DG Chest 2 View  Result Date: 06/26/2019 CLINICAL DATA:  Status post coronary bypass grafting with incision pain EXAM: CHEST - 2 VIEW COMPARISON:  05/30/2019 FINDINGS: Cardiac shadow is stable. Postsurgical changes are again noted. Aortic calcifications are seen. Lungs are well aerated bilaterally without focal infiltrate or sizable effusion. No acute bony abnormality is noted. IMPRESSION: Postsurgical change without acute abnormality. Electronically Signed   By: Inez Catalina M.D.   On: 06/26/2019 14:39   ECHOCARDIOGRAM LIMITED  Result Date: 07/18/2019    ECHOCARDIOGRAM LIMITED REPORT   Patient Name:   [name] [facility]  Date of Exam: 07/17/2019 Medical Rec #:  [id number]     Height:       72.0 in Accession #:    [id number]    Weight:       221.0 lb Date of Birth:  26-May-1945     BSA:          2.223 m Patient Age:    33 years      BP:           165/90  mmHg Patient Gender: M             HR:           64 bpm. Exam Location:  [place] Procedure: Limited Echo, Limited Color Doppler, Cardiac Doppler and Strain            Analysis Indications:    R06.00 Dyspnea  History:        Patient has prior history of Echocardiogram examinations, most                 recent 05/02/2019. Prior CABG, Arrythmias:Atrial Fibrillation,                 Signs/Symptoms:Chest Pain; Risk Factors:Hypertension, Diabetes,                 Sleep Apnea and HLD.  Sonographer:    Marygrace Drought RCS Referring Phys: [place]  1. Left ventricular ejection fraction, by estimation, is 55 to 60%. The left ventricle has normal function. There is mild concentric left ventricular hypertrophy. Left ventricular diastolic function could not be evaluated. There is the interventricular septum is flattened in systole and diastole, consistent with right ventricular pressure and volume overload.  2. Left atrial size was severely dilated.  3. Right atrial size was severely dilated.  4. Mild to moderate mitral valve regurgitation.  5. Tricuspid valve regurgitation is moderate.  6. There is mildly elevated pulmonary artery systolic pressure. The estimated right ventricular systolic pressure is 99991111 mmHg. FINDINGS  Left Ventricle: Left ventricular ejection fraction, by estimation, is 55 to 60%. The left ventricle has normal function. There is mild concentric left ventricular hypertrophy. The interventricular septum is flattened in systole and diastole, consistent with right ventricular pressure and volume overload. Left ventricular diastolic function could not be evaluated due to atrial  fibrillation. Right Ventricle: There is mildly elevated pulmonary artery systolic pressure. The tricuspid regurgitant velocity is 2.61 m/s, and with an assumed right atrial pressure of 8 mmHg, the estimated right ventricular systolic pressure is 99991111 mmHg. Left Atrium: Left atrial size was severely dilated.  Right Atrium: Right atrial size was severely dilated. Mitral Valve: Mild to moderate mitral valve regurgitation. Tricuspid Valve: Tricuspid valve regurgitation is moderate. Aortic Valve: There is moderate thickening of the aortic valve. There is mild calcification of the aortic valve. Pulmonic Valve: Pulmonic valve regurgitation is mild.  LEFT VENTRICLE PLAX 2D LVIDd:         4.64 cm  Diastology LVIDs:         3.02 cm  LV e' lateral:   14.40 cm/s LV PW:         1.16 cm  LV E/e' lateral: 8.3 LV IVS:        1.11 cm  LV e' medial:    5.98 cm/s LVOT diam:     2.30 cm  LV E/e' medial:  20.1 LV SV:         70 LV SV Index:   32       2D Longitudinal Strain LVOT Area:     4.15 cm 2D Strain GLS Avg:     -12.3 %  RIGHT VENTRICLE RV Basal diam:  4.85 cm RV S prime:     7.51 cm/s TAPSE (M-mode): 1.3 cm RVSP:           35.2 mmHg LEFT ATRIUM              Index       RIGHT ATRIUM           Index LA diam:        5.20 cm  2.34 cm/m  RA Pressure: 8.00 mmHg LA Vol (A2C):   203.0 ml 91.32 ml/m RA Area:     36.10 cm LA Vol (A4C):   186.0 ml 83.67 ml/m RA Volume:   140.00 ml 62.98 ml/m LA Biplane Vol: 195.0 ml 87.72 ml/m  AORTIC VALVE LVOT Vmax:   86.50 cm/s LVOT Vmean:  58.600 cm/s LVOT VTI:    0.169 m  AORTA Ao Root diam: 3.50 cm MITRAL VALVE                 TRICUSPID VALVE MV Area (PHT):               TR Peak grad:   27.2 mmHg MV Decel Time:               TR Vmax:        261.00 cm/s MR Peak grad:    116.6 mmHg  Estimated RAP:  8.00 mmHg MR Mean grad:    77.0 mmHg   RVSP:           35.2 mmHg MR Vmax:         540.00 cm/s MR Vmean:        410.0 cm/s  SHUNTS MR PISA:         3.08 cm    Systemic VTI:  0.17 m MR PISA Eff ROA: 18 mm      Systemic Diam: 2.30 cm MR PISA Radius:  0.70 cm MV E velocity: 120.00 cm/s Ena Dawley MD Electronically signed by Ena Dawley MD Signature Date/Time: 07/18/2019/12:05:11 AM    Final       IMPRESSION: Stage pT1a, pN0 Thymoma, Type AB  Patient was inadvertently found to  have a stage I  thymoma found at the time of his coronary artery bypass grafting.  Intraoperative findings were significant for a 3 cm left superior anterior mediastinal mass which did not show adherence to surrounding structures or invasion.  In addition,  the path report did not show any signs of lymphovascular space invasion or transcapsular invasion.  Controversy surrounds the microscopic findings of margin status.  Initially path report was read as clear margins however there was a question of focal margin positivity on further review.  I discussed the path report in detail today with Dr. Jeannie Done and with additional review there is only very focal margin involvement if that.  She mentioned that if several other pathologists reviewed this there would be controversy on the margin status with some of the pathologists reporting very focal margin positivity and others reporting clear margins.  given these pathologic findings I do not feel postop radiation therapy would be of benefit for him, particularly with the patient's significant coronary artery disease. Radiation therapy to the mediastinum  could potentially worsen this situation over the next several years.  Today, I talked to the patient and family about the findings and work-up thus far.  We discussed the natural history of thymoma and general treatment, highlighting the role of radiotherapy in the management of more advanced, aggressive thymomas.  We discussed the available radiation techniques, and focused on the details of logistics and delivery.  We reviewed the anticipated acute and late sequelae associated with radiation in this setting.  The patient was encouraged to ask questions that I answered to the best of my ability.  We discussed that if this tumor were to reoccur then would certainly recommend radiation therapy to the area of recurrence and mediastinal region.  Overall I feel the risk of recurrence is low in the situation.  PLAN: Would recommend periodic  scanning either a chest CT scan or MRI of the chest to follow-up on his thymoma.  This could be performed through radiation oncology or cardiothoracic surgery.  Will discuss with Dr.Gerhart follow-up issues.    ------------------------------------------------  Blair Promise, PhD, MD  This document serves as a record of services personally performed by Gery Pray, MD. It was created on his behalf by Wilburn Mylar, a trained medical scribe. The creation of this record is based on the scribe's personal observations and the provider's statements to them. This document has been checked and approved by the attending provider.

## 2019-07-22 NOTE — Progress Notes (Signed)
CARDIOLOGY OFFICE NOTE  Date:  07/29/2019    [name] Date of Birth: June 03, 1945 Medical Record [id number]  PCP:  Sean Battles, MD  Cardiologist:  Servando Snare     Chief Complaint  Patient presents with  . Follow-up    History of Present Illness: [name] is a 74 y.o. male who presents today for a follow up visit. Former patient of Dr. Claris Gladden andDr. Tennant's.He basically follows with me.I see his wife as well.  He has chronic atrial fibrillation that has recurred after multiple cardioversions. Hewas previously onCoumadinand then switched over to Eliquis. He had a low risk Myoview inOctoberof 2016.He has had coronary calcifications noted on prior CT scans.He also has OSA on CPAP, hypertension, hyperlipidemia, obesity.Last CT of the chest from 10/2017 - to repeat in 2 years.   I have followed him over the past several years. He had more issues with back pain with associated HTN - we adjusted his medicines. He had his back fixed - BP came down and we were able to titrate down and even stop some of his antihypertensives. We did a telehealth visit back in June - his ACE had been changed to ARB due to cough. Otherwise was doing ok. Stable DOE.  Seen hereearlier in October- noted a new onset of exertional tightness in the his chest - coronary CT was arranged - this is abnormal with +FFR noted.Referred for cardiac cath - to try and manage medicallywith aggressive CV risk factor modificationbutended up referring on for CABGx 3 along with LA clippingon 05/27/2019 after failing on medical therapy. Post op course was unremarkable. He was placed back on his Eliquis for his AF. He did have a possible thymoma was removed - pathology was unclear with further disposition pending.I saw him about 3 weeks ago for his follow up visit - little weak and lethargic - his wife noted depression. BP was low. I cut his ARB back to just once a day.  Had lost weight. They really  changed their diet. I saw him earlier this month - he was NOT doing well - more short of breath - had fever, N & V, along with pleuritic chest pain - had had his 2nd COVID vaccine prior - I was very concerned - sent for follow up CXR, rechecked for COVID and got a limited echo. He improved without real reason as to why. I suspect this was vaccine related.   The patient does not have symptoms concerning for COVID-19 infection (fever, chills, cough, or new shortness of breath).   Comes in today. Here with his wife [name]. He feels much better. Still with some shortness of breath but no more fever, nausea, cough, etc. He starts rehab next week - he is really looking forward to this. He has seen Dr. Sondra Come - no treatment needed - for follow up scan with EBG in 6 months. BP is good. He does need his Metoprolol refilled.   Past Medical History:  Diagnosis Date  . A-fib ([place])   . Arrhythmia    hx. Atrial Fib. ,"with regurgitation"  . Depression   . Diabetes mellitus   . Dysrhythmia    a. fib-on Eliquis  . Heart murmur   . Hx of echocardiogram    Echo 3/16:  Mild LVH, EF 60-65%, mild MR, severe LAE, mild RAE, PASP 32 mmHg  . Hypercholesteremia   . Hypertension   . Macular degeneration of both eyes   . OSA on CPAP   .  Osteoarthritis (arthritis due to wear and tear of joints)   . Palpitations   . Prostate cancer ([place])    seed implantion- 15 to 20 yrs ago  . Sleep apnea    uses CPAP-settings 5  . 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 Ureter, stricture    hx. 2 yrs ago-stent has been removed now    Past Surgical History:  Procedure Laterality Date  . CLIPPING OF ATRIAL APPENDAGE N/A 05/27/2019   Procedure: CLIPPING OF ATRIAL APPENDAGE - Using AtriCure Clip size 45MM;  Surgeon: Grace Isaac, MD;  Location: [facility];  Service: Open Heart Surgery;  Laterality: N/A;  . COLONOSCOPY  2007  . CORONARY ARTERY BYPASS GRAFT N/A 05/27/2019   Procedure: CORONARY ARTERY BYPASS GRAFTING (CABG) x Three ,  using left internal mammary artery and right leg greater saphenous vein harvested endoscopically LIMA to LAD, SVG to DIAG, SVG to RCA. Incision of left internal mammary lymph node Incision of Left Internal  Mediastinal Mass;  Surgeon: Grace Isaac, MD;  Location: [facility];  Service: Open Heart Surgery;  Laterality: N/A;  . CYSTOSCOPY W/ RETROGRADES  04/04/2012   Procedure: CYSTOSCOPY WITH RETROGRADE PYELOGRAM;  Surgeon: Molli Hazard, MD;  Location: [facility];  Service: Urology;  Laterality: Bilateral;  . CYSTOSCOPY WITH RETROGRADE PYELOGRAM, URETEROSCOPY AND STENT PLACEMENT  04/04/2012   Procedure: CYSTOSCOPY WITH RETROGRADE PYELOGRAM, URETEROSCOPY AND STENT PLACEMENT;  Surgeon: Molli Hazard, MD;  Location: [facility];  Service: Urology;  Laterality: Right;  . JOINT REPLACEMENT Left    knee  . LEFT HEART CATH AND CORONARY ANGIOGRAPHY N/A 03/14/2019   Procedure: LEFT HEART CATH AND CORONARY ANGIOGRAPHY;  Surgeon: Belva Crome, MD;  Location: [facility];  Service: Cardiovascular;  Laterality: N/A;  . LUMBAR LAMINECTOMY/DECOMPRESSION MICRODISCECTOMY N/A 09/27/2017   Procedure: LAMINECTOMY LUMBAR THREE- LUMBAR FOUR, LUMBAR FOUR- LUMBAR FIVE;  Surgeon: Ashok Pall, MD;  Location: [facility];  Service: Neurosurgery;  Laterality: N/A;  . ROTATOR CUFF REPAIR Left   . seed implants    . TEE WITHOUT CARDIOVERSION N/A 05/27/2019   Procedure: TRANSESOPHAGEAL ECHOCARDIOGRAM (TEE);  Surgeon: Grace Isaac, MD;  Location: [facility];  Service: Open Heart Surgery;  Laterality: N/A;  . TOTAL KNEE ARTHROPLASTY Left 08/06/2014   Procedure: LEFT TOTAL KNEE ARTHROPLASTY;  Surgeon: Susa Day, MD;  Location: [facility];  Service: Orthopedics;  Laterality: Left;     Medications: Current Meds  Medication Sig  . acetaminophen (TYLENOL) 325 MG tablet Take 2 tablets (650 mg total) by mouth every 6 (six) hours as needed for mild pain.  Marland Kitchen allopurinol (ZYLOPRIM) 300 MG tablet Take 300 mg by mouth daily.    Marland Kitchen aspirin EC 81 MG EC tablet Take 1 tablet (81 mg total) by mouth daily.  Marland Kitchen atorvastatin (LIPITOR) 40 MG tablet Take 1 tablet (40 mg total) by mouth daily at 6 PM.  . ELIQUIS 5 MG TABS tablet TAKE 1 TABLET BY MOUTH TWO  TIMES DAILY  . furosemide (LASIX) 20 MG tablet Take 1 tablet (20 mg total) by mouth daily.  Marland Kitchen losartan (COZAAR) 50 MG tablet Take 50 mg by mouth 2 (two) times daily.  . metFORMIN (GLUCOPHAGE) 1000 MG tablet Take 500 mg by mouth daily with breakfast.  . metoprolol tartrate (LOPRESSOR) 25 MG tablet Take 1 tablet (25 mg total) by mouth 2 (two) times daily.  . Multiple Vitamin (MULTIVITAMIN WITH MINERALS) TABS tablet Take 1 tablet by mouth daily. Centrum Silver  . Multiple Vitamins-Minerals (  PRESERVISION AREDS 2) CAPS Take 1 capsule by mouth 2 (two) times daily.   . potassium chloride (KLOR-CON) 10 MEQ tablet Take 1 tablet (10 mEq total) by mouth daily.  . traMADol (ULTRAM) 50 MG tablet Take 1 tablet (50 mg total) by mouth every 6 (six) hours as needed.  . [DISCONTINUED] metoprolol tartrate (LOPRESSOR) 25 MG tablet Take 1 tablet (25 mg total) by mouth 2 (two) times daily.     Allergies: Allergies  Allergen Reactions  . Lisinopril Cough  . Sulfa Drugs Cross Reactors     CHILDHOOD UNSPECIFIED REACTION   . Contrast Media [Iodinated Diagnostic Agents] Rash  . Doxycycline Itching  . Ioxaglate Rash  . Tetracyclines & Related Rash    Social History: The patient  reports that he quit smoking about 46 years ago. He has never used smokeless tobacco. He reports current alcohol use. He reports that he does not use drugs.   Family History: The patient's family history includes Cancer in his mother; Colon cancer in his mother; Dementia in his mother; Heart attack in his father and sister; Stroke in his sister.   Review of Systems: Please see the history of present illness.   All other systems are reviewed and negative.   Physical Exam: VS:  BP 104/76   Pulse 66   Ht 5\' 11"   (1.803 m)   Wt 221 lb (100.2 kg)   SpO2 98%   BMI 30.82 kg/m  .  BMI Body mass index is 30.82 kg/m.  Wt Readings from Last 3 Encounters:  07/29/19 221 lb (100.2 kg)  07/29/19 219 lb 5.7 oz (99.5 kg)  07/24/19 221 lb (100.2 kg)    General: Pleasant. Alert and in no acute distress. He looks much better today.   HEENT: Normal.  Neck: Supple, no JVD, carotid bruits, or masses noted.  Cardiac: Irregular irregular rhythm. Rate ok.  No edema.  Respiratory:  Lungs are clear to auscultation bilaterally with normal work of breathing.  GI: Soft and nontender.  MS: No deformity or atrophy. Gait and ROM intact.  Skin: Warm and dry. Color is normal.  Neuro:  Strength and sensation are intact and no gross focal deficits noted.  Psych: Alert, appropriate and with normal affect.   LABORATORY DATA:  EKG:  EKG is not ordered today.   Lab Results  Component Value Date   WBC 5.9 07/14/2019   HGB 13.4 07/14/2019   HCT 39.5 07/14/2019   PLT 171 07/14/2019   GLUCOSE 128 (H) 07/14/2019   CHOL 137 10/28/2018   TRIG 215 (H) 10/28/2018   HDL 33 (L) 10/28/2018   LDLCALC 61 10/28/2018   ALT 29 05/23/2019   AST 32 05/23/2019   NA 136 07/14/2019   K 3.9 07/14/2019   CL 99 07/14/2019   CREATININE 1.27 07/14/2019   BUN 13 07/14/2019   CO2 23 07/14/2019   INR 1.6 (H) 05/27/2019   HGBA1C 5.5 05/23/2019       BNP (last 3 results) No results for input(s): BNP in the last 8760 hours.  ProBNP (last 3 results) No results for input(s): PROBNP in the last 8760 hours.   Other Studies Reviewed Today:  CABG 05/2019 PROCEDURE PERFORMED:1Coronary artery bypass grafting x3 with the left internal mammary to the left anterior descending coronary artery, reverse saphenous vein graft to the diagonal coronary artery, reverse saphenous vein graft to the distal right  coronary artery with right greater saphenous thigh endoscopic vein harvesting 2. Placement of Atriclip- Left Atrial Clip  45 mm  3.  Sampling of left internal mammary lymph node 4. 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   LEFT HEART CATH AND CORONARY ANGIOGRAPHY10/30/2020  Conclusion   Moderately severe three-vessel coronary disease.  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 2 small obtuse marginal branch is supplied on the left lateral wall.  40% proximal LAD with 60 to 70% diffuse mid to distal LAD. First diagonal contains eccentric 50 to 70% narrowing.  Right coronary is diffusely diseased with mid 60 to 65% stenosis. Distal 75% stenosis before small PDA. 99% stenosis in the continuation of the right coronary before the second PLA. The acute marginal branch of the right coronary has multifocal 95% stenoses.  Low normal LVEF approximately 50% with normal EDP.  RECOMMENDATIONS:   Aggressive risk preventive therapy with LDL target less than 55.  Add long-acting nitrate therapy to improve exertional dyspnea and chest tightness.  If unacceptable symptoms, would consider multivessel coronary bypass grafting. In absence of high-grade proximal CAD, I feel a measured approach attempting to control symptoms with medications is a reasonable first option.     ASSESSMENT & PLAN:   1. CAD - s/p CABG x 3 - doing much better- starting rehab next week.   2. Prior multitude of complaints - ?vaccine related - this seems pretty much resolved except for shortness of breath - will see how he progresses with rehab - may need to consider changing beta blocker - he was on Atenolol in the past.   3. Persistent AF - rate controlled - on Eliquis. Has had clipping of the LA - no plan to restore NSR.  4. Chronic anticoagulation - no problems noted.   5.  AB thymoma - has seen radiation oncology - no radiation felt to be needed - for repeat scan in 6 months.   6. HTN - BP is good - no changes made today.   7. Lung nodule - will have  follow up scan in 6 months.   8. HLD - on statin - fasting labs on return with me.   9. OA - not discussed today.   10. COVID-19 Education: The signs and symptoms of COVID-19 were discussed with the patient and how to seek care for testing (follow up with PCP or arrange E-visit).  The importance of social distancing, staying at home, hand hygiene and wearing a mask when out in public were discussed today.  Current medicines are reviewed with the patient today.  The patient does not have concerns regarding medicines other than what has been noted above.  The following changes have been made:  See above.  Labs/ tests ordered today include:   No orders of the defined types were placed in this encounter.    Disposition:   FU with me in 2 months with fasting labs.   Patient is agreeable to this plan and will call if any problems develop in the interim.   SignedTruitt Merle, NP  07/29/2019 11:34 AM  [facility] [address] [place], [place]  [address] Phone: [phone] Fax: [phone]

## 2019-07-23 ENCOUNTER — Other Ambulatory Visit: Payer: Self-pay

## 2019-07-23 ENCOUNTER — Ambulatory Visit
Admission: RE | Admit: 2019-07-23 | Discharge: 2019-07-23 | Disposition: A | Discharge status: Home / Self Care | Payer: Medicare Other | Source: Ambulatory Visit | Attending: Radiation Oncology | Admitting: Radiation Oncology

## 2019-07-23 ENCOUNTER — Encounter: Payer: Self-pay | Admitting: Radiation Oncology

## 2019-07-23 ENCOUNTER — Ambulatory Visit (HOSPITAL_COMMUNITY): Payer: Medicare Other

## 2019-07-23 VITALS — BP 130/81 | HR 65 | Temp 99.1°F | Resp 18 | Ht 72.0 in | Wt 220.4 lb

## 2019-07-23 DIAGNOSIS — Z7984 Long term (current) use of oral hypoglycemic drugs: Secondary | ICD-10-CM | POA: Insufficient documentation

## 2019-07-23 DIAGNOSIS — Z79899 Other long term (current) drug therapy: Secondary | ICD-10-CM | POA: Diagnosis not present

## 2019-07-23 DIAGNOSIS — Z809 Family history of malignant neoplasm, unspecified: Secondary | ICD-10-CM | POA: Insufficient documentation

## 2019-07-23 DIAGNOSIS — C61 Malignant neoplasm of prostate: Secondary | ICD-10-CM | POA: Diagnosis not present

## 2019-07-23 DIAGNOSIS — Z7982 Long term (current) use of aspirin: Secondary | ICD-10-CM | POA: Diagnosis not present

## 2019-07-23 DIAGNOSIS — I482 Chronic atrial fibrillation, unspecified: Secondary | ICD-10-CM | POA: Insufficient documentation

## 2019-07-23 DIAGNOSIS — C37 Malignant neoplasm of thymus: Secondary | ICD-10-CM | POA: Insufficient documentation

## 2019-07-23 DIAGNOSIS — E119 Type 2 diabetes mellitus without complications: Secondary | ICD-10-CM | POA: Insufficient documentation

## 2019-07-23 DIAGNOSIS — F329 Major depressive disorder, single episode, unspecified: Secondary | ICD-10-CM | POA: Insufficient documentation

## 2019-07-23 DIAGNOSIS — I10 Essential (primary) hypertension: Secondary | ICD-10-CM | POA: Insufficient documentation

## 2019-07-23 DIAGNOSIS — E7800 Pure hypercholesterolemia, unspecified: Secondary | ICD-10-CM | POA: Diagnosis not present

## 2019-07-23 DIAGNOSIS — I209 Angina pectoris, unspecified: Secondary | ICD-10-CM

## 2019-07-23 DIAGNOSIS — M1990 Unspecified osteoarthritis, unspecified site: Secondary | ICD-10-CM | POA: Insufficient documentation

## 2019-07-23 DIAGNOSIS — Z87891 Personal history of nicotine dependence: Secondary | ICD-10-CM | POA: Diagnosis not present

## 2019-07-23 DIAGNOSIS — Z923 Personal history of irradiation: Secondary | ICD-10-CM | POA: Diagnosis not present

## 2019-07-23 NOTE — Patient Instructions (Signed)
Coronavirus (COVID-19) Are you at risk?  Are you at risk for the Coronavirus (COVID-19)?  To be considered HIGH RISK for Coronavirus (COVID-19), you have to meet the following criteria:  . Traveled to [place], [place], [place], [place] or [place]; or in the [place] to [place], [place], [place], or [place]; and have fever, cough, and shortness of breath within the last 2 weeks of travel OR . Been in close contact with a person diagnosed with COVID-19 within the last 2 weeks and have fever, cough, and shortness of breath . IF YOU DO NOT MEET THESE CRITERIA, YOU ARE CONSIDERED LOW RISK FOR COVID-19.  What to do if you are HIGH RISK for COVID-19?  . If you are having a medical emergency, call 911. . Seek medical care right away. Before you go to a doctor's office, urgent care or emergency department, call ahead and tell them about your recent travel, contact with someone diagnosed with COVID-19, and your symptoms. You should receive instructions from your physician's office regarding next steps of care.  . When you arrive at healthcare provider, tell the healthcare staff immediately you have returned from visiting [place], [place], [place], [place] or [place]; or traveled in the [place] to [place], [place], [place], or [place]; in the last two weeks or you have been in close contact with a person diagnosed with COVID-19 in the last 2 weeks.   . Tell the health care staff about your symptoms: fever, cough and shortness of breath. . After you have been seen by a medical provider, you will be either: o Tested for (COVID-19) and discharged home on quarantine except to seek medical care if symptoms worsen, and asked to  - Stay home and avoid contact with others until you get your results (4-5 days)  - Avoid travel on public transportation if possible (such as bus, train, or airplane) or o Sent to the Emergency Department by EMS for evaluation, COVID-19 testing, and possible  admission depending on your condition and test results.  What to do if you are LOW RISK for COVID-19?  Reduce your risk of any infection by using the same precautions used for avoiding the common cold or flu:  . Wash your hands often with soap and warm water for at least 20 seconds.  If soap and water are not readily available, use an alcohol-based hand sanitizer with at least 60% alcohol.  . If coughing or sneezing, cover your mouth and nose by coughing or sneezing into the elbow areas of your shirt or coat, into a tissue or into your sleeve (not your hands). . Avoid shaking hands with others and consider head nods or verbal greetings only. . Avoid touching your eyes, nose, or mouth with unwashed hands.  . Avoid close contact with people who are sick. . Avoid places or events with large numbers of people in one location, like concerts or sporting events. . Carefully consider travel plans you have or are making. . If you are planning any travel outside or inside the [place], visit the CDC's Travelers' Health webpage for the latest health notices. . If you have some symptoms but not all symptoms, continue to monitor at home and seek medical attention if your symptoms worsen. . If you are having a medical emergency, call 911.   ADDITIONAL HEALTHCARE OPTIONS FOR PATIENTS  [facility] / e-Visit: [email or web address]         [facility]: [phone]  [facility]: [phone]                   [facility]: [phone]   

## 2019-07-23 NOTE — Progress Notes (Signed)
[address]       [place] [address]             [phone]      Rafel A Vanderpool Blossom Medical Record [id number] Date of Birth: Apr 16, 1946  Referring: Burtis Junes, NP Primary Care: Leanna Battles, MD Primary Cardiologist: No primary care provider on file.   Chief Complaint:   POST OP FOLLOW UP OPERATIVE REPORT DATE OF PROCEDURE:  05/27/2019 PREOPERATIVE DIAGNOSIS:  Coronary occlusive disease with refractory angina. POSTOPERATIVE DIAGNOSES:  Coronary occlusive disease with refractory angina, anterior mediastinal mass. PROCEDURE PERFORMED: 1 Coronary artery bypass grafting x3 with the left internal mammary to the left anterior descending coronary artery, reverse saphenous vein graft to the diagonal coronary artery, reverse saphenous vein graft to the distal right  coronary artery with right greater saphenous thigh endoscopic vein harvesting 2. Placement of Atriclip- Left Atrial Clip 45 mm  3. Sampling of left internal mammary lymph node 4. 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  History of Present Illness:     Patient returns to the office today for postop visit following coronary artery bypass grafting January 12, at the time of surgery and incidental 3 cm left superior anterior mediastinal mass was resected-  Is seen by Dr. Sofie Hartigan radiation oncology yesterday and the consensus is to continue with close follow-up and not treat with radiation therapy for the resected thymoma.  Since last seen the patient has continued to increase his physical activity appropriately.  His wife notes that he has pain at the garden she had and was planning on using his backpack blower later today. -He was cautioned about sternal weight limits and cautioned not to start using a backpack blower currently. He is to start cardiac rehab next week.     Past Medical History:  Diagnosis Date  . A-fib ([place])   . Arrhythmia    hx. Atrial Fib. ,"with  regurgitation"  . Depression   . Diabetes mellitus   . Dysrhythmia    a. fib-on Eliquis  . Heart murmur   . Hx of echocardiogram    Echo 3/16:  Mild LVH, EF 60-65%, mild MR, severe LAE, mild RAE, PASP 32 mmHg  . Hypercholesteremia   . Hypertension   . Macular degeneration of both eyes   . OSA on CPAP   . Osteoarthritis (arthritis due to wear and tear of joints)   . Palpitations   . Prostate cancer ([place])    seed implantion- 15 to 20 yrs ago  . Sleep apnea    uses CPAP-settings 5  . 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 Ureter, stricture    hx. 2 yrs ago-stent has been removed now     Social History   Tobacco Use  Smoking Status Former Smoker  . Quit date: 05/15/1973  . Years since quitting: 46.2  Smokeless Tobacco Never Used    Social History   Substance and Sexual Activity  Alcohol Use Yes   Comment: "2 cocktails twice a week"     Allergies  Allergen Reactions  . Lisinopril Cough  . Sulfa Drugs Cross Reactors     CHILDHOOD UNSPECIFIED REACTION   . Contrast Media [Iodinated Diagnostic Agents] Rash  . Doxycycline Itching  . Ioxaglate Rash  . Tetracyclines & Related Rash    Current Outpatient Medications  Medication Sig Dispense Refill  . acetaminophen (TYLENOL) 325 MG tablet Take 2 tablets (650 mg total) by mouth every 6 (  six) hours as needed for mild pain.    Marland Kitchen allopurinol (ZYLOPRIM) 300 MG tablet Take 300 mg by mouth daily.    Marland Kitchen aspirin EC 81 MG EC tablet Take 1 tablet (81 mg total) by mouth daily. 30 tablet 0  . atorvastatin (LIPITOR) 40 MG tablet Take 1 tablet (40 mg total) by mouth daily at 6 PM. 30 tablet 1  . ELIQUIS 5 MG TABS tablet TAKE 1 TABLET BY MOUTH TWO  TIMES DAILY 180 tablet 2  . furosemide (LASIX) 20 MG tablet Take 1 tablet (20 mg total) by mouth daily. 30 tablet 1  . losartan (COZAAR) 50 MG tablet Take 1 tablet (50 mg total) by mouth daily. (Patient taking differently: Take by mouth 2 (two) times daily. ) 180 tablet 3  . metFORMIN (GLUCOPHAGE)  1000 MG tablet Take 500 mg by mouth daily with breakfast.    . metoprolol tartrate (LOPRESSOR) 25 MG tablet Take 1 tablet (25 mg total) by mouth 2 (two) times daily. 60 tablet 1  . Multiple Vitamin (MULTIVITAMIN WITH MINERALS) TABS tablet Take 1 tablet by mouth daily. Centrum Silver    . Multiple Vitamins-Minerals (PRESERVISION AREDS 2) CAPS Take 1 capsule by mouth 2 (two) times daily.     . potassium chloride (KLOR-CON) 10 MEQ tablet Take 1 tablet (10 mEq total) by mouth daily. 30 tablet 1  . traMADol (ULTRAM) 50 MG tablet Take 1 tablet (50 mg total) by mouth every 6 (six) hours as needed. 28 tablet 0   No current facility-administered medications for this visit.       Physical Exam: BP 136/88   Pulse 80   Temp 97.6 F (36.4 C) (Skin)   Resp 20   Ht 6' (1.829 m)   Wt 221 lb (100.2 kg)   SpO2 99% Comment: RA  BMI 29.97 kg/m   General appearance: alert, cooperative and no distress Neck: no adenopathy, no carotid bruit, no JVD, supple, symmetrical, trachea midline and thyroid not enlarged, symmetric, no tenderness/mass/nodules Resp: clear to auscultation bilaterally Cardio: irregularly irregular rhythm Extremities: extremities normal, atraumatic, no cyanosis or edema and Homans sign is negative, no sign of DVT Neurologic: Grossly normal   Diagnostic Studies & Laboratory data:     Recent Lab Findings: Lab Results  Component Value Date   WBC 5.9 07/14/2019   HGB 13.4 07/14/2019   HCT 39.5 07/14/2019   PLT 171 07/14/2019   GLUCOSE 128 (H) 07/14/2019   CHOL 137 10/28/2018   TRIG 215 (H) 10/28/2018   HDL 33 (L) 10/28/2018   LDLCALC 61 10/28/2018   ALT 29 05/23/2019   AST 32 05/23/2019   NA 136 07/14/2019   K 3.9 07/14/2019   CL 99 07/14/2019   CREATININE 1.27 07/14/2019   BUN 13 07/14/2019   CO2 23 07/14/2019   INR 1.6 (H) 05/27/2019   HGBA1C 5.5 05/23/2019   SURGICAL PATHOLOGY   patient returns to the office* THIS IS AN AMENDED REPORT CASE: [id number]    PATIENT: [facility]  Surgical Pathology Report   Amendment    Reason for Amendment #1: Other  Reason for Amendment #2: Additional clinical/test information   Clinical History: CAD, AFIB (cm)      FINAL MICROSCOPIC DIAGNOSIS:   A. LYMPH NODE, LEFT INTERNAL MAMMARY, BIOPSY:  - Lymph node, benign.   B. MEDIASTINAL MASS, LEFT ANTERIOR, EXCISION:  - Type AB thymoma, 3.1 cm; Please see oncology table.   ONCOLOGY TABLE:   THYMUS:   Procedure: Thymectomy  Tumor  Size: 3.1 cm  Histologic Type: Type AB thymoma  Transcapsular Invasion: Absent  Tumor Extension: Tumor confined to thymus  Margins: Equivocal (See Amendment Note #1)  Treatment Effect: No known presurgical therapy  Lymphovascular Invasion: Not identified  Regional Lymph Nodes:    Number of Lymph Nodes Involved: 0    Number of Lymph Nodes Examined: 1  Pathologic Stage Classification (pTNM, AJCC 8th Edition): pT1a, pN0 (See  Amendment Note #2)  Ancillary Studies: Can be performed up request  Representative Tumor Block: B2  Comment: Dr. Tresa Moore concurs with the histologic type.  (v4.0.0.1)   AMENDMENT NOTE #1: DIAGNOSTIC INFORMATION HAS NOT BEEN CHANGED. The  originating pathologist considered the black inked margin uninvolved by  tumor. Upon retrospective review for conference, the reviewing  pathologist considered the black inked margin focally involved by tumor.  It is acknowledged that interobserver variability can occur in this  setting, and as such, the margin status is deemed equivocal. The final  report should read Margins: Equivocal INSTEAD OF Margins: Uninvolved by  tumor. This has been corrected.   AMENDMENT NOTE #2: DIAGNOSTIC INFORMATION HAS NOT BEEN CHANGED.  Additional clinical information obtained regarding specimen A/left  internal mammary lymph node indicates that it should be included in the  pathologic stage classification. The final report should read pN0  INSTEAD OF pNX. This has  been corrected.   Case discussed with Dr. Lanelle Bal on 06/06/2019.   INTRAOPERATIVE DIAGNOSIS:   B. Left anterior mediastinal mass: "Malignant neoplasm-differential  diagnosis includes thymoma and carcinoma. Defer to permanent sections  for final diagnosis."  Intraoperative diagnosis rendered by Dr. Vic Ripper at 11:39 AM on  05/27/2019.   GROSS DESCRIPTION:   A: Received fresh is a 0.7 cm tan-pink lymph node. A portion is placed  in RPMI. The remainder is entirely submitted in 1 cassette.   B: Received fresh for rapid intraoperative consult is a 3.1 x 2.7 x 1.8  cm tan-red ovoid nodule which grossly appears encapsulated. The cut  surfaces are solid and tan-pink to pale red. A section is submitted for  frozen section. The remainder of the specimen is entirely submitted in  4 cassettes.  1 = tissue submitted for frozen section.  2-5 = remainder of specimen (GRP 05/27/2019).    Final Diagnosis performed by Gillie Manners, MD.  Electronically  signed 05/29/2019  Amendment #1 performed by Gillie Manners, MD.  Electronically signed  06/04/2019  Amendment #2 performed by Gillie Manners, MD.  Electronically signed  06/09/2019  Technical and / or Professional components performed at [facility]. [facility], [place] [address], [place], [place] [address].  Immunohistochemistry Technical component (if applicable) was performed  at [facility]. [address], [place],  [place], [place] [address].  IMMUNOHISTOCHEMISTRY DISCLAIMER (if applicable):  Some of these immunohistochemical stains may have been developed and the  performance characteristics determine by [facility]. Some  may not have been cleared or approved by the U.S. Food and Drug  Administration. The FDA has determined that such clearance or approval  is not necessary. This test is used for clinical purposes. It should not  be regarded as investigational or for  research. This laboratory is  certified under the [place]  (CLIA-88) as qualified to perform high complexity clinical laboratory  testing. The controls stained appropriately. Path:    Assessment / Plan:      #1 patient stable making good progress following coronary artery bypass grafting-he was  to start cardiac rehab next week, was cautioned about heavy lifting more than 20 to 25 pounds for full 3 months after surgery  #2 thymoma-clinically this was not invasive-encapsulated and easily resected.  Follow-up pathology shows equivocal microscopic margin-patient has been seen by radiation oncology and the consensus is not to proceed with radiation but close follow-up-plan to see him back in 6 months with a noncontrasted CT scan of the chest   Medication Changes: No orders of the defined types were placed in this encounter.     Grace Isaac MD      [address]  [place] [address] Office [phone]     07/24/2019 10:34 AM

## 2019-07-23 NOTE — Progress Notes (Signed)
Thoracic Location of Tumor / Histology:  anterior mediastinal mass   Biopsies revealed: 05/27/19: FINAL MICROSCOPIC DIAGNOSIS:   A. LYMPH NODE, LEFT INTERNAL MAMMARY, BIOPSY:  - Lymph node, benign.   B. MEDIASTINAL MASS, LEFT ANTERIOR, EXCISION:  - Type AB thymoma, 3.1 cm; Please see oncology table.   Tobacco/Marijuana/Snuff/ETOH use:  Tobacco Use  Smoking Status Former Smoker  . Quit date: 05/15/1973  . Years since quitting: 46.1  Smokeless Tobacco Never Used    Social History       Substance and Sexual Activity  Alcohol Use Yes   Comment: "2 cocktails twice a week"     Past/Anticipated interventions by cardiothoracic surgery, if any: 05/27/19 Per Dr. Servando Snare: PROCEDURE PERFORMED:1Coronary artery bypass grafting x3 with the left internal mammary to the left anterior descending coronary artery, reverse saphenous vein graft to the diagonal coronary artery, reverse saphenous vein graft to the distal right  coronary artery with right greater saphenous thigh endoscopic vein harvesting 2. Placement of Atriclip- Left Atrial Clip 45 mm  3. Sampling of left internal mammary lymph node 4. 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  Past/Anticipated interventions by medical oncology, if any: None at this time.   Signs/Symptoms Weight changes, if any:  Wt Readings from Last 3 Encounters:  07/23/19 220 lb 6 oz (100 kg)  07/14/19 221 lb (100.2 kg)  06/26/19 221 lb (100.2 kg)       Respiratory complaints, if any: Pt reports SOB which originates prior to surgery. Pt reports lack of stamina, mostly resolved by surgery.  Hemoptysis, if any: Denies  Pain issues, if any:  Pt reports dull pain in chest "from surgery", rated 3/10.  SAFETY ISSUES:  Prior radiation? Pt had radioactive seed implant in prostate approximately 20 years ago. Pt reports that cesium seeds were implanted at [facility].  Pacemaker/ICD? no   Possible current pregnancy?N/A  Is the patient  on methotrexate? no  Current Complaints / other details:  Pt and wife present today for initial consult with Dr. Sondra Come for Radiation Oncology. Pt and wife report Dr. Servando Snare has referred pt here for Dr. Clabe Seal opinion on radiation to thymoma.   BP 130/81 (BP Location: Left Arm, Patient Position: Sitting)   Pulse 65   Temp 99.1 F (37.3 C) (Temporal)   Resp 18   Ht 6' (1.829 m)   Wt 220 lb 6 oz (100 kg)   SpO2 99%   BMI 29.89 kg/m   Wt Readings from Last 3 Encounters:  07/23/19 220 lb 6 oz (100 kg)  07/14/19 221 lb (100.2 kg)  06/26/19 221 lb (100.2 kg)   Loma Sousa, RN BSN

## 2019-07-24 ENCOUNTER — Ambulatory Visit (INDEPENDENT_AMBULATORY_CARE_PROVIDER_SITE_OTHER): Payer: Self-pay | Admitting: Cardiothoracic Surgery

## 2019-07-24 VITALS — BP 136/88 | HR 80 | Temp 97.6°F | Resp 20 | Ht 72.0 in | Wt 221.0 lb

## 2019-07-24 DIAGNOSIS — J9859 Other diseases of mediastinum, not elsewhere classified: Secondary | ICD-10-CM

## 2019-07-24 DIAGNOSIS — Z09 Encounter for follow-up examination after completed treatment for conditions other than malignant neoplasm: Secondary | ICD-10-CM

## 2019-07-24 DIAGNOSIS — Z951 Presence of aortocoronary bypass graft: Secondary | ICD-10-CM

## 2019-07-25 ENCOUNTER — Telehealth (HOSPITAL_COMMUNITY): Payer: Self-pay

## 2019-07-25 ENCOUNTER — Ambulatory Visit (HOSPITAL_COMMUNITY): Payer: Medicare Other

## 2019-07-25 ENCOUNTER — Telehealth: Payer: Self-pay | Admitting: *Deleted

## 2019-07-25 NOTE — Telephone Encounter (Signed)
S/w pt's wife due to pt calling in to not being able to keep pt's upcoming appt. Due to cardiac rehab.  Pt is going to keep f/u with [name] since pts appt is 11:15 and rehab is 7:30 am.

## 2019-07-28 ENCOUNTER — Other Ambulatory Visit: Payer: Self-pay | Admitting: Physician Assistant

## 2019-07-28 ENCOUNTER — Ambulatory Visit (HOSPITAL_COMMUNITY): Payer: Medicare Other

## 2019-07-28 NOTE — Telephone Encounter (Signed)
Cardiac Rehab Medication Review by a Pharmacist  Does the patient  feel that his/her medications are working for him/her?  yes  Has the patient been experiencing any side effects to the medications prescribed?  no  Does the patient measure his/her own blood pressure or blood glucose at home? Blood pressure is ~128-130/76-80. Blood glucose is ~104-105 in the morning.   Does the patient have any problems obtaining medications due to transportation or finances?   no  Understanding of regimen: good Understanding of indications: good Potential of compliance: good    Pharmacist comments: N/A    Henri Medal 07/28/2019 12:06 PM

## 2019-07-29 ENCOUNTER — Encounter: Payer: Self-pay | Admitting: Nurse Practitioner

## 2019-07-29 ENCOUNTER — Encounter (HOSPITAL_COMMUNITY)
Admission: RE | Admit: 2019-07-29 | Discharge: 2019-07-29 | Disposition: A | Discharge status: Home / Self Care | Payer: Medicare Other | Source: Ambulatory Visit | Attending: Cardiothoracic Surgery | Admitting: Cardiothoracic Surgery

## 2019-07-29 ENCOUNTER — Other Ambulatory Visit: Payer: Self-pay

## 2019-07-29 ENCOUNTER — Other Ambulatory Visit: Payer: Self-pay | Admitting: Physician Assistant

## 2019-07-29 ENCOUNTER — Ambulatory Visit: Payer: Medicare Other | Admitting: Nurse Practitioner

## 2019-07-29 ENCOUNTER — Encounter (HOSPITAL_COMMUNITY): Payer: Self-pay

## 2019-07-29 VITALS — BP 104/76 | HR 66 | Ht 71.0 in | Wt 221.0 lb

## 2019-07-29 VITALS — BP 108/60 | HR 63 | Temp 96.8°F | Resp 18 | Ht 71.0 in | Wt 219.4 lb

## 2019-07-29 DIAGNOSIS — Z79899 Other long term (current) drug therapy: Secondary | ICD-10-CM | POA: Insufficient documentation

## 2019-07-29 DIAGNOSIS — E785 Hyperlipidemia, unspecified: Secondary | ICD-10-CM

## 2019-07-29 DIAGNOSIS — I10 Essential (primary) hypertension: Secondary | ICD-10-CM | POA: Diagnosis not present

## 2019-07-29 DIAGNOSIS — Z7984 Long term (current) use of oral hypoglycemic drugs: Secondary | ICD-10-CM | POA: Insufficient documentation

## 2019-07-29 DIAGNOSIS — Z7982 Long term (current) use of aspirin: Secondary | ICD-10-CM | POA: Insufficient documentation

## 2019-07-29 DIAGNOSIS — R0600 Dyspnea, unspecified: Secondary | ICD-10-CM

## 2019-07-29 DIAGNOSIS — Z7901 Long term (current) use of anticoagulants: Secondary | ICD-10-CM | POA: Insufficient documentation

## 2019-07-29 DIAGNOSIS — Z7189 Other specified counseling: Secondary | ICD-10-CM

## 2019-07-29 DIAGNOSIS — I259 Chronic ischemic heart disease, unspecified: Secondary | ICD-10-CM

## 2019-07-29 DIAGNOSIS — G4733 Obstructive sleep apnea (adult) (pediatric): Secondary | ICD-10-CM | POA: Insufficient documentation

## 2019-07-29 DIAGNOSIS — Z87891 Personal history of nicotine dependence: Secondary | ICD-10-CM | POA: Insufficient documentation

## 2019-07-29 DIAGNOSIS — Z951 Presence of aortocoronary bypass graft: Secondary | ICD-10-CM

## 2019-07-29 DIAGNOSIS — H3530 Unspecified macular degeneration: Secondary | ICD-10-CM | POA: Insufficient documentation

## 2019-07-29 DIAGNOSIS — E119 Type 2 diabetes mellitus without complications: Secondary | ICD-10-CM | POA: Insufficient documentation

## 2019-07-29 DIAGNOSIS — Z8546 Personal history of malignant neoplasm of prostate: Secondary | ICD-10-CM | POA: Insufficient documentation

## 2019-07-29 DIAGNOSIS — I4891 Unspecified atrial fibrillation: Secondary | ICD-10-CM | POA: Insufficient documentation

## 2019-07-29 DIAGNOSIS — E7800 Pure hypercholesterolemia, unspecified: Secondary | ICD-10-CM | POA: Insufficient documentation

## 2019-07-29 MED ORDER — METOPROLOL TARTRATE 25 MG PO TABS: 25.0000 mg | ORAL_TABLET | Freq: Two times a day (BID) | ORAL | 3 refills | Status: DC

## 2019-07-29 NOTE — Progress Notes (Signed)
Cardiac Individual Treatment Plan  Patient Details  Name: [name] MRN: [id number] Date of Birth: 1945/08/04 Referring Provider:     CARDIAC REHAB PHASE II ORIENTATION from 07/29/2019 in [place]  Referring Provider  Lanelle Bal, MD      Initial Encounter Date:    CARDIAC REHAB PHASE II ORIENTATION from 07/29/2019 in [place]  Date  07/29/19      Visit Diagnosis: S/P CABG x 3  Patient's Home Medications on Admission:  Current Outpatient Medications:  .  acetaminophen (TYLENOL) 325 MG tablet, Take 2 tablets (650 mg total) by mouth every 6 (six) hours as needed for mild pain., Disp:  , Rfl:  .  allopurinol (ZYLOPRIM) 300 MG tablet, Take 300 mg by mouth daily., Disp: , Rfl:  .  aspirin EC 81 MG EC tablet, Take 1 tablet (81 mg total) by mouth daily., Disp: 30 tablet, Rfl: 0 .  atorvastatin (LIPITOR) 40 MG tablet, Take 1 tablet (40 mg total) by mouth daily at 6 PM., Disp: 30 tablet, Rfl: 1 .  ELIQUIS 5 MG TABS tablet, TAKE 1 TABLET BY MOUTH TWO  TIMES DAILY, Disp: 180 tablet, Rfl: 2 .  furosemide (LASIX) 20 MG tablet, Take 1 tablet (20 mg total) by mouth daily., Disp: 30 tablet, Rfl: 1 .  losartan (COZAAR) 50 MG tablet, Take 50 mg by mouth 2 (two) times daily., Disp: , Rfl:  .  metFORMIN (GLUCOPHAGE) 1000 MG tablet, Take 500 mg by mouth daily with breakfast., Disp: , Rfl:  .  metoprolol tartrate (LOPRESSOR) 25 MG tablet, Take 1 tablet (25 mg total) by mouth 2 (two) times daily., Disp: 180 tablet, Rfl: 3 .  Multiple Vitamin (MULTIVITAMIN WITH MINERALS) TABS tablet, Take 1 tablet by mouth daily. Centrum Silver, Disp: , Rfl:  .  Multiple Vitamins-Minerals (PRESERVISION AREDS 2) CAPS, Take 1 capsule by mouth 2 (two) times daily. , Disp: , Rfl:  .  potassium chloride (KLOR-CON) 10 MEQ tablet, Take 1 tablet (10 mEq total) by mouth daily., Disp: 30 tablet, Rfl: 1 .  traMADol (ULTRAM) 50 MG tablet, Take 1 tablet (50 mg  total) by mouth every 6 (six) hours as needed., Disp: 28 tablet, Rfl: 0  Past Medical History: Past Medical History:  Diagnosis Date  . A-fib ([place])   . Arrhythmia    hx. Atrial Fib. ,"with regurgitation"  . Depression   . Diabetes mellitus   . Dysrhythmia    a. fib-on Eliquis  . Heart murmur   . Hx of echocardiogram    Echo 3/16:  Mild LVH, EF 60-65%, mild MR, severe LAE, mild RAE, PASP 32 mmHg  . Hypercholesteremia   . Hypertension   . Macular degeneration of both eyes   . OSA on CPAP   . Osteoarthritis (arthritis due to wear and tear of joints)   . Palpitations   . 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 Sleep apnea    uses CPAP-settings 5  . 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 Ureter, stricture    hx. 2 yrs ago-stent has been removed now    Tobacco Use: Social History   Tobacco Use  Smoking Status Former Smoker  . Quit date: 05/15/1973  . Years since quitting: 46.2  Smokeless Tobacco Never Used    Labs: Recent Review Scientist, physiological    Labs for ITP Cardiac and Pulmonary Rehab Latest Ref Rng & Units 05/27/2019  05/27/2019 05/27/2019 05/27/2019 05/27/2019   Cholestrol 100 - 199 mg/dL - - - - -   LDLCALC 0 - 99 mg/dL - - - - -   HDL >39 mg/dL - - - - -   Trlycerides 0 - 149 mg/dL - - - - -   Hemoglobin A1c 4.8 - 5.6 % - - - - -   PHART 7.350 - 7.450 - 7.334(L) 7.426 7.393 7.412   PCO2ART 32.0 - 48.0 mmHg - 45.3 35.0 36.5 32.1   HCO3 20.0 - 28.0 mmol/L - 24.1 23.5 22.2 21.0   TCO2 22 - 32 mmol/L 23 25 25 23 22    ACIDBASEDEF 0.0 - 2.0 mmol/L - 2.0 1.0 2.0 4.0(H)   O2SAT % - 97.0 98.0 98.0 98.0      Capillary Blood Glucose: Lab Results  Component Value Date   GLUCAP 135 (H) 06/01/2019   GLUCAP 109 (H) 05/31/2019   GLUCAP 157 (H) 05/31/2019   GLUCAP 94 05/31/2019   GLUCAP 130 (H) 05/30/2019     Exercise Target Goals: Exercise Program Goal: Individual exercise prescription set using results from initial 6 min walk test and THRR while  considering  patient's activity barriers and safety.   Exercise Prescription Goal: Initial exercise prescription builds to 30-45 minutes a day of aerobic activity, 2-3 days per week.  Home exercise guidelines will be given to patient during program as part of exercise prescription that the participant will acknowledge.  Activity Barriers & Risk Stratification: Activity Barriers & Cardiac Risk Stratification - 07/29/19 0754      Activity Barriers & Cardiac Risk Stratification   Activity Barriers  Left Knee Replacement;Assistive Device;Other (comment);Shortness of Breath    Comments  Left rotator cuff repair    Cardiac Risk Stratification  High       6 Minute Walk: 6 Minute Walk    Row Name 07/29/19 0801         6 Minute Walk   Phase  Initial     Distance  1485 feet     Walk Time  6 minutes     # of Rest Breaks  0     MPH  2.81     METS  2.72     RPE  12     Perceived Dyspnea   0     VO2 Peak  9.52     Symptoms  No     Resting HR  63 bpm     Resting BP  108/60     Resting Oxygen Saturation   97 %     Exercise Oxygen Saturation  during 6 min walk  99 %     Max Ex. HR  97 bpm     Max Ex. BP  122/82     2 Minute Post BP  104/76        Oxygen Initial Assessment:   Oxygen Re-Evaluation:   Oxygen Discharge (Final Oxygen Re-Evaluation):   Initial Exercise Prescription: Initial Exercise Prescription - 07/29/19 1100      Date of Initial Exercise RX and Referring Provider   Date  07/29/19    Referring Provider  Lanelle Bal, MD    Expected Discharge Date  09/26/19      Bike   Level  2.5    Minutes  15    METs  2.7      NuStep   Level  3    SPM  85    Minutes  15    METs  2.7      Prescription Details   Frequency (times per week)  3    Duration  Progress to 30 minutes of continuous aerobic without signs/symptoms of physical distress      Intensity   THRR 40-80% of Max Heartrate  58-116    Ratings of Perceived Exertion  11-13    Perceived Dyspnea   0-4      Progression   Progression  Continue to progress workloads to maintain intensity without signs/symptoms of physical distress.      Resistance Training   Training Prescription  Yes    Weight  3lbs.    Reps  10-15       Perform Capillary Blood Glucose checks as needed.  Exercise Prescription Changes:   Exercise Comments:   Exercise Goals and Review:  Exercise Goals    Row Name 07/29/19 0753             Exercise Goals   Increase Physical Activity  Yes       Intervention  Provide advice, education, support and counseling about physical activity/exercise needs.;Develop an individualized exercise prescription for aerobic and resistive training based on initial evaluation findings, risk stratification, comorbidities and participant's personal goals.       Expected Outcomes  Short Term: Attend rehab on a regular basis to increase amount of physical activity.;Long Term: Exercising regularly at least 3-5 days a week.;Long Term: Add in home exercise to make exercise part of routine and to increase amount of physical activity.       Increase Strength and Stamina  Yes       Intervention  Provide advice, education, support and counseling about physical activity/exercise needs.;Develop an individualized exercise prescription for aerobic and resistive training based on initial evaluation findings, risk stratification, comorbidities and participant's personal goals.       Expected Outcomes  Short Term: Increase workloads from initial exercise prescription for resistance, speed, and METs.;Short Term: Perform resistance training exercises routinely during rehab and add in resistance training at home;Long Term: Improve cardiorespiratory fitness, muscular endurance and strength as measured by increased METs and functional capacity (6MWT)       Able to understand and use rate of perceived exertion (RPE) scale  Yes       Intervention  Provide education and explanation on how to use RPE scale        Expected Outcomes  Short Term: Able to use RPE daily in rehab to express subjective intensity level;Long Term:  Able to use RPE to guide intensity level when exercising independently       Knowledge and understanding of Target Heart Rate Range (THRR)  Yes       Intervention  Provide education and explanation of THRR including how the numbers were predicted and where they are located for reference       Expected Outcomes  Short Term: Able to state/look up THRR;Long Term: Able to use THRR to govern intensity when exercising independently;Short Term: Able to use daily as guideline for intensity in rehab       Able to check pulse independently  Yes       Intervention  Provide education and demonstration on how to check pulse in carotid and radial arteries.;Review the importance of being able to check your own pulse for safety during independent exercise       Expected Outcomes  Short Term: Able to explain why pulse checking is important during independent exercise;Long Term: Able to check pulse independently and accurately  Understanding of Exercise Prescription  Yes       Intervention  Provide education, explanation, and written materials on patient's individual exercise prescription       Expected Outcomes  Short Term: Able to explain program exercise prescription;Long Term: Able to explain home exercise prescription to exercise independently          Exercise Goals Re-Evaluation :   Discharge Exercise Prescription (Final Exercise Prescription Changes):   Nutrition:  Target Goals: Understanding of nutrition guidelines, daily intake of sodium 1500mg , cholesterol 200mg , calories 30% from fat and 7% or less from saturated fats, daily to have 5 or more servings of fruits and vegetables.  Biometrics: Pre Biometrics - 07/29/19 0752      Pre Biometrics   Waist Circumference  41.5 inches    Hip Circumference  46.5 inches    Waist to Hip Ratio  0.89 %    Triceps Skinfold  12 mm    %  Body Fat  28.3 %    Grip Strength  45 kg    Flexibility  0 in    Single Leg Stand  7.68 seconds        Nutrition Therapy Plan and Nutrition Goals:   Nutrition Assessments:   Nutrition Goals Re-Evaluation:   Nutrition Goals Re-Evaluation:   Nutrition Goals Discharge (Final Nutrition Goals Re-Evaluation):   Psychosocial: Target Goals: Acknowledge presence or absence of significant depression and/or stress, maximize coping skills, provide positive support system. Participant is able to verbalize types and ability to use techniques and skills needed for reducing stress and depression.  Initial Review & Psychosocial Screening: Initial Psych Review & Screening - 07/29/19 0756      Initial Review   Current issues with  None Identified      Family Dynamics   Good Support System?  Yes    Concerns  Recent loss of child    Comments  Mr. Feaser has a positive outlook and attitude. He is excited about participation in CR as he feels exercise and outdoors helps with his emotional health. He has a very supportive wife and belongs to a band. 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 His son recently passed away in 06-03-19 of renal disease but he is dealing with his grief in a healthy way by gardening with his wife outdoors now that the weather is warmer. No interventions needed at this time as no barriers to participation in CR identified      Barriers   Psychosocial barriers to participate in program  There are no identifiable barriers or psychosocial needs.      Screening Interventions   Interventions  Encouraged to exercise       Quality of Life Scores: Quality of Life - 07/29/19 1200      Quality of Life   Select  Quality of Life      Quality of Life Scores   Health/Function Pre  19.47 %    Socioeconomic Pre  23.5 %    Psych/Spiritual Pre  22.93 %    Family Pre  20.75 %    GLOBAL Pre  21.14 %      Scores of 19 and below usually  indicate a poorer quality of life in these areas.  A difference of  2-3 points is a clinically meaningful difference.  A difference of 2-3 points in the total score of the Quality of Life Index has been associated with significant improvement in overall quality  of life, self-image, physical symptoms, and general health in studies assessing change in quality of life.  PHQ-9: Recent Review Flowsheet Data    Depression screen [facility] 2/9 07/29/2019 07/29/2019   Decreased Interest - 0   Down, Depressed, Hopeless (No Data)  1   PHQ - 2 Score - 1     Interpretation of Total Score  Total Score Depression Severity:  1-4 = Minimal depression, 5-9 = Mild depression, 10-14 = Moderate depression, 15-19 = Moderately severe depression, 20-27 = Severe depression   Psychosocial Evaluation and Intervention:   Psychosocial Re-Evaluation:   Psychosocial Discharge (Final Psychosocial Re-Evaluation):   Vocational Rehabilitation: Provide vocational rehab assistance to qualifying candidates.   Vocational Rehab Evaluation & Intervention: Vocational Rehab - 07/29/19 1337      Initial Vocational Rehab Evaluation & Intervention   Assessment shows need for Vocational Rehabilitation  No       Education: Education Goals: Education classes will be provided on a weekly basis, covering required topics. Participant will state understanding/return demonstration of topics presented.  Learning Barriers/Preferences:   Education Topics: Count Your Pulse:  -Group instruction provided by verbal instruction, demonstration, patient participation and written materials to support subject.  Instructors address importance of being able to find your pulse and how to count your pulse when at home without a heart monitor.  Patients get hands on experience counting their pulse with staff help and individually.   Heart Attack, Angina, and Risk Factor Modification:  -Group instruction provided by verbal instruction, video, and  written materials to support subject.  Instructors address signs and symptoms of angina and heart attacks.    Also discuss risk factors for heart disease and how to make changes to improve heart health risk factors.   Functional Fitness:  -Group instruction provided by verbal instruction, demonstration, patient participation, and written materials to support subject.  Instructors address safety measures for doing things around the house.  Discuss how to get up and down off the floor, how to pick things up properly, how to safely get out of a chair without assistance, and balance training.   Meditation and Mindfulness:  -Group instruction provided by verbal instruction, patient participation, and written materials to support subject.  Instructor addresses importance of mindfulness and meditation practice to help reduce stress and improve awareness.  Instructor also leads participants through a meditation exercise.    Stretching for Flexibility and Mobility:  -Group instruction provided by verbal instruction, patient participation, and written materials to support subject.  Instructors lead participants through series of stretches that are designed to increase flexibility thus improving mobility.  These stretches are additional exercise for major muscle groups that are typically performed during regular warm up and cool down.   Hands Only CPR:  -Group verbal, video, and participation provides a basic overview of AHA guidelines for community CPR. Role-play of emergencies allow participants the opportunity to practice calling for help and chest compression technique with discussion of AED use.   Hypertension: -Group verbal and written instruction that provides a basic overview of hypertension including the most recent diagnostic guidelines, risk factor reduction with self-care instructions and medication management.    Nutrition I class: Heart Healthy Eating:  -Group instruction provided by  PowerPoint slides, verbal discussion, and written materials to support subject matter. The instructor gives an explanation and review of the Therapeutic Lifestyle Changes diet recommendations, which includes a discussion on lipid goals, dietary fat, sodium, fiber, plant stanol/sterol esters, sugar, and the components of a well-balanced, healthy  diet.   Nutrition II class: Lifestyle Skills:  -Group instruction provided by PowerPoint slides, verbal discussion, and written materials to support subject matter. The instructor gives an explanation and review of label reading, grocery shopping for heart health, heart healthy recipe modifications, and ways to make healthier choices when eating out.   Diabetes Question & Answer:  -Group instruction provided by PowerPoint slides, verbal discussion, and written materials to support subject matter. The instructor gives an explanation and review of diabetes co-morbidities, pre- and post-prandial blood glucose goals, pre-exercise blood glucose goals, signs, symptoms, and treatment of hypoglycemia and hyperglycemia, and foot care basics.   Diabetes Blitz:  -Group instruction provided by PowerPoint slides, verbal discussion, and written materials to support subject matter. The instructor gives an explanation and review of the physiology behind type 1 and type 2 diabetes, diabetes medications and rational behind using different medications, pre- and post-prandial blood glucose recommendations and Hemoglobin A1c goals, diabetes diet, and exercise including blood glucose guidelines for exercising safely.    Portion Distortion:  -Group instruction provided by PowerPoint slides, verbal discussion, written materials, and food models to support subject matter. The instructor gives an explanation of serving size versus portion size, changes in portions sizes over the last 20 years, and what consists of a serving from each food group.   Stress Management:  -Group  instruction provided by verbal instruction, video, and written materials to support subject matter.  Instructors review role of stress in heart disease and how to cope with stress positively.     Exercising on Your Own:  -Group instruction provided by verbal instruction, power point, and written materials to support subject.  Instructors discuss benefits of exercise, components of exercise, frequency and intensity of exercise, and end points for exercise.  Also discuss use of nitroglycerin and activating EMS.  Review options of places to exercise outside of rehab.  Review guidelines for sex with heart disease.   Cardiac Drugs I:  -Group instruction provided by verbal instruction and written materials to support subject.  Instructor reviews cardiac drug classes: antiplatelets, anticoagulants, beta blockers, and statins.  Instructor discusses reasons, side effects, and lifestyle considerations for each drug class.   Cardiac Drugs II:  -Group instruction provided by verbal instruction and written materials to support subject.  Instructor reviews cardiac drug classes: angiotensin converting enzyme inhibitors (ACE-I), angiotensin II receptor blockers (ARBs), nitrates, and calcium channel blockers.  Instructor discusses reasons, side effects, and lifestyle considerations for each drug class.   Anatomy and Physiology of the Circulatory System:  Group verbal and written instruction and models provide basic cardiac anatomy and physiology, with the coronary electrical and arterial systems. Review of: AMI, Angina, Valve disease, Heart Failure, Peripheral Artery Disease, Cardiac Arrhythmia, Pacemakers, and the ICD.   Other Education:  -Group or individual verbal, written, or video instructions that support the educational goals of the cardiac rehab program.   Holiday Eating Survival Tips:  -Group instruction provided by PowerPoint slides, verbal discussion, and written materials to support subject  matter. The instructor gives patients tips, tricks, and techniques to help them not only survive but enjoy the holidays despite the onslaught of food that accompanies the holidays.   Knowledge Questionnaire Score: Knowledge Questionnaire Score - 07/29/19 1200      Knowledge Questionnaire Score   Pre Score  16/24       Core Components/Risk Factors/Patient Goals at Admission: Personal Goals and Risk Factors at Admission - 07/29/19 1308      Core Components/Risk Factors/Patient  Goals on Admission    Weight Management  Yes;Weight Loss    Intervention  Weight Management: Provide education and appropriate resources to help participant work on and attain dietary goals.;Weight Management: Develop a combined nutrition and exercise program designed to reach desired caloric intake, while maintaining appropriate intake of nutrient and fiber, sodium and fats, and appropriate energy expenditure required for the weight goal.    Admit Weight  219 lb 5.7 oz (99.5 kg)    Goal Weight: Short Term  213 lb (96.6 kg)    Goal Weight: Long Term  199 lb (90.3 kg)    Expected Outcomes  Short Term: Continue to assess and modify interventions until short term weight is achieved;Long Term: Adherence to nutrition and physical activity/exercise program aimed toward attainment of established weight goal;Weight Loss: Understanding of general recommendations for a balanced deficit meal plan, which promotes 1-2 lb weight loss per week and includes a negative energy balance of [phone] kcal/d;Understanding recommendations for meals to include 15-35% energy as protein, 25-35% energy from fat, 35-60% energy from carbohydrates, less than 200mg  of dietary cholesterol, 20-35 gm of total fiber daily;Understanding of distribution of calorie intake throughout the day with the consumption of 4-5 meals/snacks    Diabetes  Yes    Intervention  Provide education about signs/symptoms and action to take for hypo/hyperglycemia.;Provide education  about proper nutrition, including hydration, and aerobic/resistive exercise prescription along with prescribed medications to achieve blood glucose in normal ranges: Fasting glucose 65-99 mg/dL    Expected Outcomes  Short Term: Participant verbalizes understanding of the signs/symptoms and immediate care of hyper/hypoglycemia, proper foot care and importance of medication, aerobic/resistive exercise and nutrition plan for blood glucose control.;Long Term: Attainment of HbA1C < 7%.    Hypertension  Yes    Intervention  Provide education on lifestyle modifcations including regular physical activity/exercise, weight management, moderate sodium restriction and increased consumption of fresh fruit, vegetables, and low fat dairy, alcohol moderation, and smoking cessation.;Monitor prescription use compliance.    Expected Outcomes  Short Term: Continued assessment and intervention until BP is < 140/39mm HG in hypertensive participants. < 130/43mm HG in hypertensive participants with diabetes, heart failure or chronic kidney disease.;Long Term: Maintenance of blood pressure at goal levels.    Lipids  Yes    Intervention  Provide education and support for participant on nutrition & aerobic/resistive exercise along with prescribed medications to achieve LDL 70mg , HDL >40mg .    Expected Outcomes  Short Term: Participant states understanding of desired cholesterol values and is compliant with medications prescribed. Participant is following exercise prescription and nutrition guidelines.;Long Term: Cholesterol controlled with medications as prescribed, with individualized exercise RX and with personalized nutrition plan. Value goals: LDL < 70mg , HDL > 40 mg.       Core Components/Risk Factors/Patient Goals Review:    Core Components/Risk Factors/Patient Goals at Discharge (Final Review):    ITP Comments: ITP Comments    Row Name 07/29/19 0752           ITP Comments  Dr. Fransico Him, Medical Director [facility]  Cardiac Rehab          Comments: Patient attended orientation on 07/29/2019 to review rules and guidelines for program.  Completed 6 minute walk test, Intitial ITP, and exercise prescription.  VSS. Telemetry-Afib with occasional atrial conduction.  Asymptomatic. Safety measures and social distancing in place per CDC guidelines.

## 2019-07-29 NOTE — Patient Instructions (Addendum)
After Visit Summary:  We will be checking the following labs today - NONE   Medication Instructions:    Continue with your current medicines.    If you need a refill on your cardiac medications before your next appointment, please call your pharmacy.     Testing/Procedures To Be Arranged:  N/A  Follow-Up:   See me in 2 months with fasting labs    At [facility], you and your health needs are our priority.  As part of our continuing mission to provide you with exceptional heart care, we have created designated Provider Care Teams.  These Care Teams include your primary Cardiologist (physician) and Advanced Practice Providers (APPs -  Physician Assistants and Nurse Practitioners) who all work together to provide you with the care you need, when you need it.  Special Instructions:  . Stay safe, stay home, wash your hands for at least 20 seconds and wear a mask when out in public.  . It was good to talk with you today.    Call the [place] office at [phone] if you have any questions, problems or concerns.

## 2019-07-30 ENCOUNTER — Ambulatory Visit (HOSPITAL_COMMUNITY): Payer: Medicare Other

## 2019-08-01 ENCOUNTER — Ambulatory Visit (HOSPITAL_COMMUNITY): Payer: Medicare Other

## 2019-08-04 ENCOUNTER — Encounter (HOSPITAL_COMMUNITY)
Admission: RE | Admit: 2019-08-04 | Discharge: 2019-08-04 | Disposition: A | Discharge status: Home / Self Care | Payer: Medicare Other | Source: Ambulatory Visit | Attending: Interventional Cardiology | Admitting: Interventional Cardiology

## 2019-08-04 ENCOUNTER — Other Ambulatory Visit: Payer: Self-pay

## 2019-08-04 ENCOUNTER — Ambulatory Visit (HOSPITAL_COMMUNITY): Payer: Medicare Other

## 2019-08-04 DIAGNOSIS — Z8546 Personal history of malignant neoplasm of prostate: Secondary | ICD-10-CM | POA: Diagnosis not present

## 2019-08-04 DIAGNOSIS — Z951 Presence of aortocoronary bypass graft: Secondary | ICD-10-CM | POA: Diagnosis not present

## 2019-08-04 DIAGNOSIS — G4733 Obstructive sleep apnea (adult) (pediatric): Secondary | ICD-10-CM | POA: Diagnosis not present

## 2019-08-04 DIAGNOSIS — Z87891 Personal history of nicotine dependence: Secondary | ICD-10-CM | POA: Diagnosis not present

## 2019-08-04 DIAGNOSIS — I10 Essential (primary) hypertension: Secondary | ICD-10-CM | POA: Diagnosis not present

## 2019-08-04 DIAGNOSIS — Z7982 Long term (current) use of aspirin: Secondary | ICD-10-CM | POA: Diagnosis not present

## 2019-08-04 DIAGNOSIS — E119 Type 2 diabetes mellitus without complications: Secondary | ICD-10-CM | POA: Diagnosis not present

## 2019-08-04 DIAGNOSIS — I4891 Unspecified atrial fibrillation: Secondary | ICD-10-CM | POA: Diagnosis not present

## 2019-08-04 DIAGNOSIS — Z7984 Long term (current) use of oral hypoglycemic drugs: Secondary | ICD-10-CM | POA: Diagnosis not present

## 2019-08-04 DIAGNOSIS — Z79899 Other long term (current) drug therapy: Secondary | ICD-10-CM | POA: Diagnosis not present

## 2019-08-04 DIAGNOSIS — E7800 Pure hypercholesterolemia, unspecified: Secondary | ICD-10-CM | POA: Diagnosis not present

## 2019-08-04 DIAGNOSIS — Z7901 Long term (current) use of anticoagulants: Secondary | ICD-10-CM | POA: Diagnosis not present

## 2019-08-04 DIAGNOSIS — H3530 Unspecified macular degeneration: Secondary | ICD-10-CM | POA: Diagnosis not present

## 2019-08-04 LAB — GLUCOSE, CAPILLARY
Glucose-Capillary: 137 mg/dL — ABNORMAL HIGH (ref 70–99)
Glucose-Capillary: 102 mg/dL — ABNORMAL HIGH (ref 70–99)

## 2019-08-04 NOTE — Progress Notes (Signed)
Daily Session Note  Patient Details  Name: [name] MRN: [id number] Date of Birth: Dec 09, 1945 Referring Provider:     CARDIAC REHAB PHASE II ORIENTATION from 07/29/2019 in [place]  Referring Provider  Lanelle Bal, MD      Encounter Date: 08/04/2019  Check In: Session Check In - 08/04/19 0718      Check-In   Supervising physician immediately available to respond to emergencies  [facility] immediately available    Physician(s)  Dr. Darrick Meigs    Location  [facility]    Staff Present  Jiles Garter, RN, Bjorn Loser, MS, Exercise Physiologist;Tyara Carol Ada, MS,ACSM CEP, Exercise Physiologist;Carzell Saldivar Rollene Rotunda, RN, Deland Pretty, MS, ACSM CEP, Exercise Physiologist    Virtual Visit  No    Medication changes reported      No    Fall or balance concerns reported     No    Tobacco Cessation  No Change    Warm-up and Cool-down  Performed on first and last piece of equipment    Resistance Training Performed  Yes    VAD Patient?  No    PAD/SET Patient?  No      Pain Assessment   Currently in Pain?  No/denies    Pain Score  0-No pain    Multiple Pain Sites  No       Capillary Blood Glucose: Results for orders placed or performed during the hospital encounter of 08/04/19 (from the past 24 hour(s))  Glucose, capillary     Status: Abnormal   Collection Time: 08/04/19  7:00 AM  Result Value Ref Range   Glucose-Capillary 137 (H) 70 - 99 mg/dL  Glucose, capillary     Status: Abnormal   Collection Time: 08/04/19  8:15 AM  Result Value Ref Range   Glucose-Capillary 102 (H) 70 - 99 mg/dL      Social History   Tobacco Use  Smoking Status Former Smoker  . Quit date: 05/15/1973  . Years since quitting: 46.2  Smokeless Tobacco Never Used    Goals Met:  Exercise tolerated well No report of cardiac concerns or symptoms Strength training completed today  Goals Unmet:  Not Applicable  Comments: Pt started cardiac  rehab today.  Pt tolerated light exercise without difficulty. VSS, telemetry-Afib, asymptomatic.  Medication list reconciled. Pt denies barriers to medicaiton compliance.  PSYCHOSOCIAL ASSESSMENT:  PHQ-1. This is secondary to Covid-19 restrictions and lack of interactions with family and friends. Pt exhibits positive coping skills, hopeful outlook with supportive family. No psychosocial needs identified at this time, no psychosocial interventions necessary.  Pt oriented to exercise equipment and routine.    Understanding verbalized.  VCR Note: Today patient was able to download the [employer] app on their smart device with RN assistance. Pt set up their account and received the following welcome message -"Welcome to the [place] [facility] program. We hope that you will find the exercise program beneficial in your recovery process. Our staff is available to assist with any questions/concerns about your exercise routine. Best wishes". Brief orientation provided to with the advisement to watch the "Intro to Rehab" series located under the Resource tab. Pt verbalized understanding. Will continue to follow and monitor pt progress with feedback as needed.   Dr. Fransico Him is Medical Director for Cardiac Rehab at [facility].

## 2019-08-04 NOTE — Progress Notes (Signed)
[name] 74 y.o. male Nutrition Note  Visit Diagnosis: S/P CABG x 3  Past Medical History:  Diagnosis Date  . A-fib ([place])   . Arrhythmia    hx. Atrial Fib. ,"with regurgitation"  . Depression   . Diabetes mellitus   . Dysrhythmia    a. fib-on Eliquis  . Heart murmur   . Hx of echocardiogram    Echo 3/16:  Mild LVH, EF 60-65%, mild MR, severe LAE, mild RAE, PASP 32 mmHg  . Hypercholesteremia   . Hypertension   . Macular degeneration of both eyes   . OSA on CPAP   . Osteoarthritis (arthritis due to wear and tear of joints)   . Palpitations   . 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 Sleep apnea    uses CPAP-settings 5  . 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 Ureter, stricture    hx. 2 yrs ago-stent has been removed now     Medications reviewed.   Current Outpatient Medications:  .  acetaminophen (TYLENOL) 325 MG tablet, Take 2 tablets (650 mg total) by mouth every 6 (six) hours as needed for mild pain., Disp:  , Rfl:  .  allopurinol (ZYLOPRIM) 300 MG tablet, Take 300 mg by mouth daily., Disp: , Rfl:  .  aspirin EC 81 MG EC tablet, Take 1 tablet (81 mg total) by mouth daily., Disp: 30 tablet, Rfl: 0 .  atorvastatin (LIPITOR) 40 MG tablet, Take 1 tablet (40 mg total) by mouth daily at 6 PM., Disp: 30 tablet, Rfl: 1 .  ELIQUIS 5 MG TABS tablet, TAKE 1 TABLET BY MOUTH TWO  TIMES DAILY, Disp: 180 tablet, Rfl: 2 .  furosemide (LASIX) 20 MG tablet, Take 1 tablet (20 mg total) by mouth daily., Disp: 30 tablet, Rfl: 1 .  losartan (COZAAR) 50 MG tablet, Take 50 mg by mouth 2 (two) times daily., Disp: , Rfl:  .  metFORMIN (GLUCOPHAGE) 1000 MG tablet, Take 500 mg by mouth daily with breakfast., Disp: , Rfl:  .  metoprolol tartrate (LOPRESSOR) 25 MG tablet, Take 1 tablet (25 mg total) by mouth 2 (two) times daily., Disp: 180 tablet, Rfl: 3 .  Multiple Vitamin (MULTIVITAMIN WITH MINERALS) TABS tablet, Take 1 tablet by mouth daily. Centrum Silver, Disp: , Rfl:  .   Multiple Vitamins-Minerals (PRESERVISION AREDS 2) CAPS, Take 1 capsule by mouth 2 (two) times daily. , Disp: , Rfl:  .  potassium chloride (KLOR-CON) 10 MEQ tablet, Take 1 tablet (10 mEq total) by mouth daily., Disp: 30 tablet, Rfl: 1 .  traMADol (ULTRAM) 50 MG tablet, Take 1 tablet (50 mg total) by mouth every 6 (six) hours as needed., Disp: 28 tablet, Rfl: 0   Ht Readings from Last 1 Encounters:  07/29/19 5\' 11"  (1.803 m)     Wt Readings from Last 3 Encounters:  07/29/19 219 lb 5.7 oz (99.5 kg)  07/29/19 221 lb (100.2 kg)  07/24/19 221 lb (100.2 kg)     There is no height or weight on file to calculate BMI.   Social History   Tobacco Use  Smoking Status Former Smoker  . Quit date: 05/15/1973  . Years since quitting: 46.2  Smokeless Tobacco Never Used     Lab Results  Component Value Date   CHOL 137 10/28/2018   Lab Results  Component Value Date   HDL 33 (L) 10/28/2018   Lab Results  Component Value Date   LDLCALC  61 10/28/2018   Lab Results  Component Value Date   TRIG 215 (H) 10/28/2018     Lab Results  Component Value Date   HGBA1C 5.5 05/23/2019     CBG (last 3)  No results for input(s): GLUCAP in the last 72 hours.   Nutrition Note  Spoke with pt. Nutrition Plan and Nutrition Survey goals reviewed with pt. Pt is following a Heart Healthy diet. Pt wants to lose wt. Pt has been trying to lose wt by decreasing processed foods and adding fruits and veggies. Wt loss tips reviewed (label reading, how to build a healthy plate, portion sizes, eating frequently across the day).  Pt has Type 2 Diabetes. Last A1c indicates blood glucose well-controlled. Pt checks CBG's 1 times a day. Fasting CBG's reportedly 100-110 mg/dL. Pt reports diagnosis about 15 years ago and very good control. Pt reports poor knowledge of how his diet affects his CBGs.  Per discussion, pt does not use canned/convenience foods often. Pt does not add salt to food. Pt does not eat out  frequently.    Pt expressed understanding of the information reviewed.   Nutrition Diagnosis ? Food-and nutrition-related knowledge deficit related to lack of exposure to information as related to diagnosis of: ? CVD ? Type 2 Diabetes ? Obese  I = 30-34.9 related to excessive energy intake as evidenced by a 30.82 kg/m2  Nutrition Intervention ? Pt's individual nutrition plan reviewed with pt. ? Benefits of adopting Heart Healthy diet discussed when Medficts reviewed.   ? Continue client-centered nutrition education by RD, as part of interdisciplinary care.  Goal(s) ? Pt to identify food quantities necessary to achieve weight loss of 6-24 lb at graduation from cardiac rehab.  ? Pt to build a healthy plate including vegetables, fruits, whole grains, and low-fat dairy products in a heart healthy meal plan. ? Pt able to name foods that affect blood glucose  Plan:   Will provide client-centered nutrition education as part of interdisciplinary care  Monitor and evaluate progress toward nutrition goal with team.   Michaele Offer, MS, RDN, LDN

## 2019-08-06 ENCOUNTER — Other Ambulatory Visit: Payer: Self-pay

## 2019-08-06 ENCOUNTER — Ambulatory Visit (HOSPITAL_COMMUNITY): Payer: Medicare Other

## 2019-08-06 ENCOUNTER — Encounter (HOSPITAL_COMMUNITY)
Admission: RE | Admit: 2019-08-06 | Discharge: 2019-08-06 | Disposition: A | Discharge status: Home / Self Care | Payer: Medicare Other | Source: Ambulatory Visit | Attending: Interventional Cardiology | Admitting: Interventional Cardiology

## 2019-08-06 DIAGNOSIS — Z951 Presence of aortocoronary bypass graft: Secondary | ICD-10-CM

## 2019-08-06 LAB — GLUCOSE, CAPILLARY: Glucose-Capillary: 87 mg/dL (ref 70–99)

## 2019-08-06 NOTE — Progress Notes (Signed)
Incomplete Session Note  Patient Details  Name: [name] MRN: [id number] Date of Birth: 1945-12-16 Referring Provider:     CARDIAC REHAB PHASE II ORIENTATION from 07/29/2019 in [place]  Referring Provider  Lanelle Bal, MD      [name] did not complete his rehab session. Mr. Mcmurry presented late to exercise. His CBG upon arrival was 87. Mr. Kaz states he ate an "[employer]" while driving. Unfortunately, per protocol, his CBG needs to be 100 or greater to exercise. He was provided with education on appropriate nutrition prior to exercise including consumption of carbohydrates and protein and encouraged to eat 45-60 min prior to arrival to allow time for blood sugar to reflect nutrition. Patient verbalized understanding. He was discharged in stable condition and will return for his in-person exercise session 08/08/19. He is encouraged to exercise independently at home today and record in [employer] VCR app.   Chamberlain Steinborn E. Rollene Rotunda RN, BSN [place]. [facility]  Cardiac and Pulmonary Rehabilitation Phone: [phone] Fax: [phone]

## 2019-08-08 ENCOUNTER — Other Ambulatory Visit: Payer: Self-pay

## 2019-08-08 ENCOUNTER — Ambulatory Visit (HOSPITAL_COMMUNITY): Payer: Medicare Other

## 2019-08-08 ENCOUNTER — Encounter (HOSPITAL_COMMUNITY)
Admission: RE | Admit: 2019-08-08 | Discharge: 2019-08-08 | Disposition: A | Discharge status: Home / Self Care | Payer: Medicare Other | Source: Ambulatory Visit | Attending: Interventional Cardiology | Admitting: Interventional Cardiology

## 2019-08-08 DIAGNOSIS — Z951 Presence of aortocoronary bypass graft: Secondary | ICD-10-CM

## 2019-08-08 LAB — GLUCOSE, CAPILLARY
Glucose-Capillary: 100 mg/dL — ABNORMAL HIGH (ref 70–99)
Glucose-Capillary: 108 mg/dL — ABNORMAL HIGH (ref 70–99)

## 2019-08-11 ENCOUNTER — Ambulatory Visit (HOSPITAL_COMMUNITY): Payer: Medicare Other

## 2019-08-11 ENCOUNTER — Encounter (HOSPITAL_COMMUNITY)
Admission: RE | Admit: 2019-08-11 | Discharge: 2019-08-11 | Disposition: A | Discharge status: Home / Self Care | Payer: Medicare Other | Source: Ambulatory Visit | Attending: Interventional Cardiology | Admitting: Interventional Cardiology

## 2019-08-11 ENCOUNTER — Other Ambulatory Visit: Payer: Self-pay

## 2019-08-11 DIAGNOSIS — Z951 Presence of aortocoronary bypass graft: Secondary | ICD-10-CM

## 2019-08-13 ENCOUNTER — Encounter (HOSPITAL_COMMUNITY)
Admission: RE | Admit: 2019-08-13 | Discharge: 2019-08-13 | Disposition: A | Discharge status: Home / Self Care | Payer: Medicare Other | Source: Ambulatory Visit | Attending: Interventional Cardiology | Admitting: Interventional Cardiology

## 2019-08-13 ENCOUNTER — Ambulatory Visit (HOSPITAL_COMMUNITY): Payer: Medicare Other

## 2019-08-13 ENCOUNTER — Other Ambulatory Visit: Payer: Self-pay

## 2019-08-13 DIAGNOSIS — Z951 Presence of aortocoronary bypass graft: Secondary | ICD-10-CM

## 2019-08-15 ENCOUNTER — Ambulatory Visit (HOSPITAL_COMMUNITY): Payer: Medicare Other

## 2019-08-15 ENCOUNTER — Encounter (HOSPITAL_COMMUNITY)
Admission: RE | Admit: 2019-08-15 | Discharge: 2019-08-15 | Disposition: A | Discharge status: Home / Self Care | Payer: Medicare Other | Source: Ambulatory Visit | Attending: Interventional Cardiology | Admitting: Interventional Cardiology

## 2019-08-15 ENCOUNTER — Other Ambulatory Visit: Payer: Self-pay

## 2019-08-15 DIAGNOSIS — Z87891 Personal history of nicotine dependence: Secondary | ICD-10-CM | POA: Diagnosis not present

## 2019-08-15 DIAGNOSIS — H3530 Unspecified macular degeneration: Secondary | ICD-10-CM | POA: Diagnosis not present

## 2019-08-15 DIAGNOSIS — I10 Essential (primary) hypertension: Secondary | ICD-10-CM | POA: Diagnosis not present

## 2019-08-15 DIAGNOSIS — Z79899 Other long term (current) drug therapy: Secondary | ICD-10-CM | POA: Diagnosis not present

## 2019-08-15 DIAGNOSIS — Z8546 Personal history of malignant neoplasm of prostate: Secondary | ICD-10-CM | POA: Insufficient documentation

## 2019-08-15 DIAGNOSIS — Z7982 Long term (current) use of aspirin: Secondary | ICD-10-CM | POA: Diagnosis not present

## 2019-08-15 DIAGNOSIS — E7800 Pure hypercholesterolemia, unspecified: Secondary | ICD-10-CM | POA: Insufficient documentation

## 2019-08-15 DIAGNOSIS — E119 Type 2 diabetes mellitus without complications: Secondary | ICD-10-CM | POA: Diagnosis not present

## 2019-08-15 DIAGNOSIS — Z7984 Long term (current) use of oral hypoglycemic drugs: Secondary | ICD-10-CM | POA: Insufficient documentation

## 2019-08-15 DIAGNOSIS — Z7901 Long term (current) use of anticoagulants: Secondary | ICD-10-CM | POA: Insufficient documentation

## 2019-08-15 DIAGNOSIS — G4733 Obstructive sleep apnea (adult) (pediatric): Secondary | ICD-10-CM | POA: Insufficient documentation

## 2019-08-15 DIAGNOSIS — I4891 Unspecified atrial fibrillation: Secondary | ICD-10-CM | POA: Insufficient documentation

## 2019-08-15 DIAGNOSIS — Z951 Presence of aortocoronary bypass graft: Secondary | ICD-10-CM | POA: Insufficient documentation

## 2019-08-18 ENCOUNTER — Encounter (HOSPITAL_COMMUNITY)
Admission: RE | Admit: 2019-08-18 | Discharge: 2019-08-18 | Disposition: A | Discharge status: Home / Self Care | Payer: Medicare Other | Source: Ambulatory Visit | Attending: Interventional Cardiology | Admitting: Interventional Cardiology

## 2019-08-18 ENCOUNTER — Other Ambulatory Visit: Payer: Self-pay

## 2019-08-18 ENCOUNTER — Ambulatory Visit (HOSPITAL_COMMUNITY): Payer: Medicare Other

## 2019-08-18 DIAGNOSIS — Z951 Presence of aortocoronary bypass graft: Secondary | ICD-10-CM | POA: Diagnosis not present

## 2019-08-18 NOTE — Progress Notes (Signed)
Nutrition Note  Spoke with pt about heart healthy diet. He is interested in managing CBGs and weight loss. We reviewed CBGs. His fasting CBGs are 100-110 mg/dl and post prandial never above 150 mg/dl. Most recent A1c 5.2%. Only taking 500 mg metformin. Very well controlled diabetes. Reviewed what carbohydrates are and how to keep them on 1/4 plate. Provided carb counting handout to adjust for disease progression but at this time pt does not need to start counting carbs as his current intake is proving great control. His weight is 217 lbs at home. We discussed ways to lose weight including adequate protein to maintain muscle mass. Recommended 30 minutes aerobic exercise combined with small adjustments to fat intake to create a calorie deficit. Pt verbalizes understanding. Will continue to monitor pt during his time in cardiac rehab.  Michaele Offer, MS, RDN, LDN

## 2019-08-18 NOTE — Progress Notes (Signed)
   08/18/19 0732  Exercise Goal Re-Evaluation  Exercise Goals Review Increase Physical Activity;Able to understand and use rate of perceived exertion (RPE) scale;Understanding of Exercise Prescription;Knowledge and understanding of Target Heart Rate Range (THRR);Able to check pulse independently;Increase Strength and Stamina  Comments Reviewed home exercise guidelines with patient including THRR, RPE scale, temperature precautions, and endpoints for exercise. Instructed patient on pulse counting. Patient is walking 30 minutes on Tuesdays and Thursdays and tries to walk during the weekend as well, as his mode of home exercise. Patient states that he is now able to walk up a hill on his path twice during his walk, which was a goal for him. Patient would also like to lose 5 lbs. Increased workload on the bike today and increased from 3lb. to 4lb. hand weights today.  Expected Outcomes Continue to increase workloads as tolerated to help achieve personal health and fitness goals.  Sol Passer, MS, ACSM CEP

## 2019-08-19 NOTE — Progress Notes (Signed)
Cardiac Individual Treatment Plan  Patient Details  Name: LANKFORD PARI MRN: [id number] Date of Birth: 29-Feb-1946 Referring Provider:     CARDIAC REHAB PHASE II ORIENTATION from 07/29/2019 in [place]  Referring Provider  Lanelle Bal, MD      Initial Encounter Date:    CARDIAC REHAB PHASE II ORIENTATION from 07/29/2019 in [place]  Date  07/29/19      Visit Diagnosis: S/P CABG x 3  Patient's Home Medications on Admission:  Current Outpatient Medications:    acetaminophen (TYLENOL) 325 MG tablet, Take 2 tablets (650 mg total) by mouth every 6 (six) hours as needed for mild pain., Disp:  , Rfl:    allopurinol (ZYLOPRIM) 300 MG tablet, Take 300 mg by mouth daily., Disp: , Rfl:    aspirin EC 81 MG EC tablet, Take 1 tablet (81 mg total) by mouth daily., Disp: 30 tablet, Rfl: 0   atorvastatin (LIPITOR) 40 MG tablet, TAKE 1 TABLET (40 MG TOTAL) BY MOUTH DAILY AT 6 PM., Disp: 30 tablet, Rfl: 1   ELIQUIS 5 MG TABS tablet, TAKE 1 TABLET BY MOUTH TWO  TIMES DAILY, Disp: 180 tablet, Rfl: 2   furosemide (LASIX) 20 MG tablet, Take 1 tablet (20 mg total) by mouth daily., Disp: 30 tablet, Rfl: 1   losartan (COZAAR) 50 MG tablet, Take 50 mg by mouth 2 (two) times daily., Disp: , Rfl:    metFORMIN (GLUCOPHAGE) 1000 MG tablet, Take 500 mg by mouth daily with breakfast., Disp: , Rfl:    metoprolol tartrate (LOPRESSOR) 25 MG tablet, Take 1 tablet (25 mg total) by mouth 2 (two) times daily., Disp: 180 tablet, Rfl: 3   Multiple Vitamin (MULTIVITAMIN WITH MINERALS) TABS tablet, Take 1 tablet by mouth daily. Centrum Silver, Disp: , Rfl:    Multiple Vitamins-Minerals (PRESERVISION AREDS 2) CAPS, Take 1 capsule by mouth 2 (two) times daily. , Disp: , Rfl:    potassium chloride (KLOR-CON) 10 MEQ tablet, Take 1 tablet (10 mEq total) by mouth daily., Disp: 30 tablet, Rfl: 1   traMADol (ULTRAM) 50 MG tablet, Take 1 tablet (50 mg  total) by mouth every 6 (six) hours as needed., Disp: 28 tablet, Rfl: 0  Past Medical History: Past Medical History:  Diagnosis Date   A-fib ([place])    Arrhythmia    hx. Atrial Fib. ,"with regurgitation"   Depression    Diabetes mellitus    Dysrhythmia    a. 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 2 yrs ago-stent has been removed now    Tobacco Use: Social History   Tobacco Use  Smoking Status Former Smoker   Quit date: 05/15/1973   Years since quitting: 46.2  Smokeless Tobacco Never Used    Labs: Recent [facility] for [facility] Pul[facility] Rehab Latest Ref Rng & Units 05/27/2019  05/27/2019 05/27/2019 05/27/2019 05/27/2019   Cholestrol 100 - 199 mg/dL - - - - -   LDLCALC 0 - 99 mg/dL - - - - -   HDL >39 mg/dL - - - - -   Trlycerides 0 - 149 mg/dL - - - - -   Hemoglobin A1c 4.8 - 5.6 % - - - - -   PHART 7.350 - 7.450 - 7.334(L) 7.426 7.393 7.412   PCO2ART 32.0 - 48.0 mmHg - 45.3 35.0 36.5 32.1   HCO3 20.0 - 28.0 mmol/L - 24.1 23.5 22.2 21.0   TCO2 22 - 32 mmol/L 23 25 25 23 22    ACIDBASEDEF 0.0 - 2.0 mmol/L - 2.0 1.0 2.0 4.0(H)   O2SAT % - 97.0 98.0 98.0 98.0      Capillary Blood Glucose: Lab Results  Component Value Date   GLUCAP 108 (H) 08/08/2019   GLUCAP 100 (H) 08/08/2019   GLUCAP 87 08/06/2019   GLUCAP 102 (H) 08/04/2019   GLUCAP 137 (H) 08/04/2019     Exercise Target Goals: Exercise Program Goal: Individual exercise prescription set using results from initial 6 min walk test and THRR while  considering  patients activity barriers and safety.   Exercise Prescription Goal: Initial exercise prescription builds to 30-45 minutes a day of aerobic activity, 2-3 days per week.  Home exercise guidelines will be given to patient during program as part of exercise prescription that the participant will acknowledge.  Activity Barriers & Risk Stratification: Activity Barriers & Cardiac Risk Stratification - 07/29/19 0754      Activity Barriers & Cardiac Risk Stratification   Activity Barriers  Left Knee Replacement;Assistive Device;Other (comment);Shortness of Breath    Comments  Left rotator cuff repair    Cardiac Risk Stratification  High       6 Minute Walk: 6 Minute Walk    Row Name 07/29/19 0801         6 Minute Walk   Phase  Initial     Distance  1485 feet     Walk Time  6 minutes     # of Rest Breaks  0     MPH  2.81     METS  2.72     RPE  12     Perceived Dyspnea   0     VO2 Peak  9.52     Symptoms  No     Resting HR  63 bpm     Resting BP  108/60     Resting Oxygen Saturation   97 %     Exercise Oxygen Saturation  during 6 min walk  99 %     Max Ex. HR  97 bpm     Max Ex. BP  122/82     2 Minute Post BP  104/76        Oxygen Initial Assessment:   Oxygen Re-Evaluation:   Oxygen Discharge (Final Oxygen Re-Evaluation):   Initial Exercise Prescription: Initial Exercise Prescription - 07/29/19 1100      Date of Initial Exercise RX and Referring Provider   Date  07/29/19    Referring Provider  Lanelle Bal, MD    Expected Discharge Date  09/26/19      Bike   Level  2.5    Minutes  15    METs  2.7      NuStep   Level  3    SPM  85    Minutes  15    METs  2.7      Prescription Details   Frequency (times per week)  3    Duration  Progress to 30 minutes of continuous aerobic without signs/symptoms of physical distress      Intensity   THRR 40-80% of Max Heartrate  58-116    Ratings of Perceived Exertion  11-13    Perceived Dyspnea   0-4      Progression   Progression  Continue to progress workloads to maintain intensity without signs/symptoms of physical distress.      Resistance Training   Training Prescription  Yes    Weight  3lbs.    Reps  10-15       Perform Capillary Blood Glucose checks as needed.  Exercise Prescription Changes:  Exercise Prescription Changes    Row Name 08/04/19 0719 08/18/19 0719           Response to Exercise   Blood Pressure (Admit)  120/70  120/72      Blood Pressure (Exercise)  144/80  136/74      Blood Pressure (Exit)  128/82  102/78      Heart Rate (Admit)  69 bpm  69 bpm      Heart Rate (Exercise)  96 bpm  96 bpm      Heart Rate (Exit)  67 bpm  69 bpm      Rating of Perceived Exertion (Exercise)  11  14      Symptoms  None  None      Comments  Off to a good start with exercise.  --      Duration  Continue with 30 min of aerobic exercise without signs/symptoms of physical distress.  Continue with 30 min of aerobic exercise without signs/symptoms of physical distress.      Intensity  THRR unchanged  THRR unchanged        Progression   Progression  Continue to progress workloads to maintain intensity without signs/symptoms of physical distress.  Continue to progress workloads to maintain intensity without signs/symptoms of physical distress.      Average METs  2.6  3.3        Resistance Training   Training Prescription  Yes  Yes      Weight  3lbs.  4lbs      Reps  10-15  10-15      Time  10 Minutes  10 Minutes        Interval Training   Interval Training  No  No        Bike   Level  2.5  4      Minutes  15  15      METs  3.2  4.5        NuStep   Level  3  4      SPM  85  85      Minutes  15  15      METs  2  2.5        Home Exercise Plan   Plans to continue exercise at  --  Home (comment) Walking      Frequency  --  Add 3 additional days to program exercise sessions.      Initial Home Exercises Provided  --  08/18/19         Exercise  Comments:  Exercise Comments    Row Name 08/04/19 0820 08/18/19 0732         Exercise Comments  Patient tolerated first session of  exercise well wihtout symtoms.  Reviewed home exercise guidelines, METs, and goals with patient.         Exercise Goals and Review:  Exercise Goals    Row Name 07/29/19 0753             Exercise Goals   Increase Physical Activity  Yes       Intervention  Provide advice, education, support and counseling about physical activity/exercise needs.;Develop an individualized exercise prescription for aerobic and resistive training based on initial evaluation findings, risk stratification, comorbidities and participant's personal goals.       Expected Outcomes  Short Term: Attend rehab on a regular basis to increase amount of physical activity.;Long Term: Exercising regularly at least 3-5 days a week.;Long Term: Add in home exercise to make exercise part of routine and to increase amount of physical activity.       Increase Strength and Stamina  Yes       Intervention  Provide advice, education, support and counseling about physical activity/exercise needs.;Develop an individualized exercise prescription for aerobic and resistive training based on initial evaluation findings, risk stratification, comorbidities and participant's personal goals.       Expected Outcomes  Short Term: Increase workloads from initial exercise prescription for resistance, speed, and METs.;Short Term: Perform resistance training exercises routinely during rehab and add in resistance training at home;Long Term: Improve cardiorespiratory fitness, muscular endurance and strength as measured by increased METs and functional capacity (6MWT)       Able to understand and use rate of perceived exertion (RPE) scale  Yes       Intervention  Provide education and explanation on how to use RPE scale       Expected Outcomes  Short Term: Able to use RPE daily in rehab to express subjective intensity  level;Long Term:  Able to use RPE to guide intensity level when exercising independently       Knowledge and understanding of Target Heart Rate Range (THRR)  Yes       Intervention  Provide education and explanation of THRR including how the numbers were predicted and where they are located for reference       Expected Outcomes  Short Term: Able to state/look up THRR;Long Term: Able to use THRR to govern intensity when exercising independently;Short Term: Able to use daily as guideline for intensity in rehab       Able to check pulse independently  Yes       Intervention  Provide education and demonstration on how to check pulse in carotid and radial arteries.;Review the importance of being able to check your own pulse for safety during independent exercise       Expected Outcomes  Short Term: Able to explain why pulse checking is important during independent exercise;Long Term: Able to check pulse independently and accurately       Understanding of Exercise Prescription  Yes       Intervention  Provide education, explanation, and written materials on patient's individual exercise prescription       Expected Outcomes  Short Term: Able to explain program exercise prescription;Long Term: Able to explain home exercise prescription to exercise independently          Exercise Goals Re-Evaluation : Exercise Goals Re-Evaluation    Row Name 08/04/19 0820 08/18/19 0732           Exercise Goal Re-Evaluation   Exercise Goals Review  Increase Physical Activity;Able to understand and use rate of perceived exertion (  RPE) scale  Increase Physical Activity;Able to understand and use rate of perceived exertion (RPE) scale;Understanding of Exercise Prescription;Knowledge and understanding of Target Heart Rate Range (THRR);Able to check pulse independently;Increase Strength and Stamina      Comments  Patient able to understand and use RPE scale appropriately. Patient has a pool at home and plans to walk laps in  the pool as one mode of exercise once the weather get warmer.  Reviewed home exercise guidelines with patient including THRR, RPE scale, temperature precautions, and endpoints for exercise. Instructed patient on pulse counting. Patient is walking 30 minutes on Tuesdays and Thursdays and tries to walk during the weekend as well, as his mode of home exercise. Patient states that he is now able to walk up a hill on his path twice during his walk, which was a goal for him. Patient would also like to lose 5 lbs. Increased workload on the bike today and increased from 3lb. to 4lb. hand weights today.      Expected Outcomes  Progress workloads to help patient achieve personal health and fitness goals.  Continue to increase workloads as tolerated to help achieve personal health and fitness goals.         Discharge Exercise Prescription (Final Exercise Prescription Changes): Exercise Prescription Changes - 08/18/19 0719      Response to Exercise   Blood Pressure (Admit)  120/72    Blood Pressure (Exercise)  136/74    Blood Pressure (Exit)  102/78    Heart Rate (Admit)  69 bpm    Heart Rate (Exercise)  96 bpm    Heart Rate (Exit)  69 bpm    Rating of Perceived Exertion (Exercise)  14    Symptoms  None    Duration  Continue with 30 min of aerobic exercise without signs/symptoms of physical distress.    Intensity  THRR unchanged      Progression   Progression  Continue to progress workloads to maintain intensity without signs/symptoms of physical distress.    Average METs  3.3      Resistance Training   Training Prescription  Yes    Weight  4lbs    Reps  10-15    Time  10 Minutes      Interval Training   Interval Training  No      Bike   Level  4    Minutes  15    METs  4.5      NuStep   Level  4    SPM  85    Minutes  15    METs  2.5      Home Exercise Plan   Plans to continue exercise at  Home (comment)   Walking   Frequency  Add 3 additional days to program exercise sessions.     Initial Home Exercises Provided  08/18/19       Nutrition:  Target Goals: Understanding of nutrition guidelines, daily intake of sodium 1500mg , cholesterol 200mg , calories 30% from fat and 7% or less from saturated fats, daily to have 5 or more servings of fruits and vegetables.  Biometrics: Pre Biometrics - 07/29/19 0752      Pre Biometrics   Waist Circumference  41.5 inches    Hip Circumference  46.5 inches    Waist to Hip Ratio  0.89 %    Triceps Skinfold  12 mm    % Body Fat  28.3 %    Grip Strength  45 kg  Flexibility  0 in    Single Leg Stand  7.68 seconds        Nutrition Therapy Plan and Nutrition Goals: Nutrition Therapy & Goals - 08/04/19 0928      Nutrition Therapy   Diet  Heart Healthy      Personal Nutrition Goals   Nutrition Goal  Pt to identify food quantities necessary to achieve weight loss of 6-24 lb at graduation from cardiac rehab.    Personal Goal #2  Pt to build a healthy plate including vegetables, fruits, whole grains, and low-fat dairy products in a heart healthy meal plan.    Personal Goal #3  Pt able to name foods that affect blood glucose      Intervention Plan   Intervention  Prescribe, educate and counsel regarding individualized specific dietary modifications aiming towards targeted core components such as weight, hypertension, lipid management, diabetes, heart failure and other comorbidities.;Nutrition handout(s) given to patient.    Expected Outcomes  Short Term Goal: A plan has been developed with personal nutrition goals set during dietitian appointment.;Long Term Goal: Adherence to prescribed nutrition plan.       Nutrition Assessments: Nutrition Assessments - 07/29/19 1407      MEDFICTS Scores   Pre Score  21       Nutrition Goals Re-Evaluation: Nutrition Goals Re-Evaluation    [place] Name 08/04/19 0929             Goals   Current Weight  221 lb (100.2 kg)       Nutrition Goal  Pt to identify food quantities necessary  to achieve weight loss of 6-24 lb at graduation from cardiac rehab.         Personal Goal #2 Re-Evaluation   Personal Goal #2  Pt to build a healthy plate including vegetables, fruits, whole grains, and low-fat dairy products in a heart healthy meal plan.         Personal Goal #3 Re-Evaluation   Personal Goal #3  Pt able to name foods that affect blood glucose          Nutrition Goals Re-Evaluation: Nutrition Goals Re-Evaluation    [place] Name 08/04/19 0929             Goals   Current Weight  221 lb (100.2 kg)       Nutrition Goal  Pt to identify food quantities necessary to achieve weight loss of 6-24 lb at graduation from cardiac rehab.         Personal Goal #2 Re-Evaluation   Personal Goal #2  Pt to build a healthy plate including vegetables, fruits, whole grains, and low-fat dairy products in a heart healthy meal plan.         Personal Goal #3 Re-Evaluation   Personal Goal #3  Pt able to name foods that affect blood glucose          Nutrition Goals Discharge (Final Nutrition Goals Re-Evaluation): Nutrition Goals Re-Evaluation - 08/04/19 0929      Goals   Current Weight  221 lb (100.2 kg)    Nutrition Goal  Pt to identify food quantities necessary to achieve weight loss of 6-24 lb at graduation from cardiac rehab.      Personal Goal #2 Re-Evaluation   Personal Goal #2  Pt to build a healthy plate including vegetables, fruits, whole grains, and low-fat dairy products in a heart healthy meal plan.      Personal Goal #3 Re-Evaluation   Personal Goal #3  Pt able to name foods that affect blood glucose       Psychosocial: Target Goals: Acknowledge presence or absence of significant depression and/or stress, maximize coping skills, provide positive support system. Participant is able to verbalize types and ability to use techniques and skills needed for reducing stress and depression.  Initial Review & Psychosocial Screening: Initial Psych Review & Screening - 07/29/19 0756       Initial Review   Current issues with  None Identified      Family Dynamics   Good Support System?  Yes    Concerns  Recent loss of child    Comments  Mr. Brisco has a positive outlook and attitude. He is excited about participation in CR as he feels exercise and outdoors helps with his emotional health. He has a very supportive wife and belongs to a band. 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 His son recently passed away in 05-31-19 of renal disease but he is dealing with his grief in a healthy way by gardening with his wife outdoors now that the weather is warmer. No interventions needed at this time as no barriers to participation in CR identified      Barriers   Psychosocial barriers to participate in program  There are no identifiable barriers or psychosocial needs.      Screening Interventions   Interventions  Encouraged to exercise       Quality of Life Scores: Quality of Life - 07/29/19 1200      Quality of Life   Select  Quality of Life      Quality of Life Scores   Health/Function Pre  19.47 %    Socioeconomic Pre  23.5 %    Psych/Spiritual Pre  22.93 %    Family Pre  20.75 %    GLOBAL Pre  21.14 %      Scores of 19 and below usually indicate a poorer quality of life in these areas.  A difference of  2-3 points is a clinically meaningful difference.  A difference of 2-3 points in the total score of the Quality of Life Index has been associated with significant improvement in overall quality of life, self-image, physical symptoms, and general health in studies assessing change in quality of life.  PHQ-9: Recent Review Flowsheet Data    Depression screen [facility] 2/9 07/29/2019 07/29/2019   Decreased Interest - 0   Down, Depressed, Hopeless (No Data)  1   PHQ - 2 Score - 1     Interpretation of Total Score  Total Score Depression Severity:  1-4 = Minimal depression, 5-9 = Mild depression, 10-14 = Moderate depression, 15-19 =  Moderately severe depression, 20-27 = Severe depression   Psychosocial Evaluation and Intervention: Psychosocial Evaluation - 08/04/19 1118      Psychosocial Evaluation & Interventions   Interventions  Encouraged to exercise with the program and follow exercise prescription    Comments  Mr. Hanneman continues to have a positive outlook and attitude related to his health. He and wife recently lost their adult child to chronic kidney disease. They cope with their loss together by being outdoors and gardening. He is alsop    Expected Outcomes  Mr. Fulco will continue to utilize his support system and hobbies to deal with the loss of his son. He will continue to have a positive attitude and outlook related to his health.    Continue Psychosocial Services   Follow  up required by staff       Psychosocial Re-Evaluation: Psychosocial Re-Evaluation    [facility] Name 08/19/19 1353             Psychosocial Re-Evaluation   Current issues with  None Identified       Comments  Mr. Saffle continues to have a positive outlook and attitude. He is excited about the warmer weather as one of his hobbies is gardening with his wife and being outdoors. He also enjoys playing in a band and looking forward to everyones Covid vaccination so they can once again play together. No barriers to self health management or participation in cardiac rehab identified. No interventions needed.       Expected Outcomes  Patient will continue to have a positive attitude and outlook. He will utilize his support system and hobbies to deal with any stressors that may arise.       Interventions  Encouraged to attend Cardiac Rehabilitation for the exercise       Continue Psychosocial Services   No Follow up required          Psychosocial Discharge (Final Psychosocial Re-Evaluation): Psychosocial Re-Evaluation - 08/19/19 1353      Psychosocial Re-Evaluation   Current issues with  None Identified    Comments  Mr. Lao continues to have a  positive outlook and attitude. He is excited about the warmer weather as one of his hobbies is gardening with his wife and being outdoors. He also enjoys playing in a band and looking forward to everyones Covid vaccination so they can once again play together. No barriers to self health management or participation in cardiac rehab identified. No interventions needed.    Expected Outcomes  Patient will continue to have a positive attitude and outlook. He will utilize his support system and hobbies to deal with any stressors that may arise.    Interventions  Encouraged to attend Cardiac Rehabilitation for the exercise    Continue Psychosocial Services   No Follow up required       Vocational Rehabilitation: Provide vocational rehab assistance to qualifying candidates.   Vocational Rehab Evaluation & Intervention: Vocational Rehab - 07/29/19 1337      Initial Vocational Rehab Evaluation & Intervention   Assessment shows need for Vocational Rehabilitation  No       Education: Education Goals: Education classes will be provided on a weekly basis, covering required topics. Participant will state understanding/return demonstration of topics presented.  Learning Barriers/Preferences:   Education Topics: Count Your Pulse:  -Group instruction provided by verbal instruction, demonstration, patient participation and written materials to support subject.  Instructors address importance of being able to find your pulse and how to count your pulse when at home without a heart monitor.  Patients get hands on experience counting their pulse with staff help and individually.   Heart Attack, Angina, and Risk Factor Modification:  -Group instruction provided by verbal instruction, video, and written materials to support subject.  Instructors address signs and symptoms of angina and heart attacks.    Also discuss risk factors for heart disease and how to make changes to improve heart health risk  factors.   Functional Fitness:  -Group instruction provided by verbal instruction, demonstration, patient participation, and written materials to support subject.  Instructors address safety measures for doing things around the house.  Discuss how to get up and down off the floor, how to pick things up properly, how to safely get out of a chair without  assistance, and balance training.   Meditation and Mindfulness:  -Group instruction provided by verbal instruction, patient participation, and written materials to support subject.  Instructor addresses importance of mindfulness and meditation practice to help reduce stress and improve awareness.  Instructor also leads participants through a meditation exercise.    Stretching for Flexibility and Mobility:  -Group instruction provided by verbal instruction, patient participation, and written materials to support subject.  Instructors lead participants through series of stretches that are designed to increase flexibility thus improving mobility.  These stretches are additional exercise for major muscle groups that are typically performed during regular warm up and cool down.   Hands Only CPR:  -Group verbal, video, and participation provides a basic overview of AHA guidelines for community CPR. Role-play of emergencies allow participants the opportunity to practice calling for help and chest compression technique with discussion of AED use.   Hypertension: -Group verbal and written instruction that provides a basic overview of hypertension including the most recent diagnostic guidelines, risk factor reduction with self-care instructions and medication management.    Nutrition I class: Heart Healthy Eating:  -Group instruction provided by PowerPoint slides, verbal discussion, and written materials to support subject matter. The instructor gives an explanation and review of the Therapeutic Lifestyle Changes diet recommendations, which includes a  discussion on lipid goals, dietary fat, sodium, fiber, plant stanol/sterol esters, sugar, and the components of a well-balanced, healthy diet.   Nutrition II class: Lifestyle Skills:  -Group instruction provided by PowerPoint slides, verbal discussion, and written materials to support subject matter. The instructor gives an explanation and review of label reading, grocery shopping for heart health, heart healthy recipe modifications, and ways to make healthier choices when eating out.   Diabetes Question & Answer:  -Group instruction provided by PowerPoint slides, verbal discussion, and written materials to support subject matter. The instructor gives an explanation and review of diabetes co-morbidities, pre- and post-prandial blood glucose goals, pre-exercise blood glucose goals, signs, symptoms, and treatment of hypoglycemia and hyperglycemia, and foot care basics.   Diabetes Blitz:  -Group instruction provided by PowerPoint slides, verbal discussion, and written materials to support subject matter. The instructor gives an explanation and review of the physiology behind type 1 and type 2 diabetes, diabetes medications and rational behind using different medications, pre- and post-prandial blood glucose recommendations and Hemoglobin A1c goals, diabetes diet, and exercise including blood glucose guidelines for exercising safely.    Portion Distortion:  -Group instruction provided by PowerPoint slides, verbal discussion, written materials, and food models to support subject matter. The instructor gives an explanation of serving size versus portion size, changes in portions sizes over the last 20 years, and what consists of a serving from each food group.   Stress Management:  -Group instruction provided by verbal instruction, video, and written materials to support subject matter.  Instructors review role of stress in heart disease and how to cope with stress positively.     Exercising on Your  Own:  -Group instruction provided by verbal instruction, power point, and written materials to support subject.  Instructors discuss benefits of exercise, components of exercise, frequency and intensity of exercise, and end points for exercise.  Also discuss use of nitroglycerin and activating EMS.  Review options of places to exercise outside of rehab.  Review guidelines for sex with heart disease.   Cardiac Drugs I:  -Group instruction provided by verbal instruction and written materials to support subject.  Instructor reviews cardiac drug classes: antiplatelets, anticoagulants,  beta blockers, and statins.  Instructor discusses reasons, side effects, and lifestyle considerations for each drug class.   Cardiac Drugs II:  -Group instruction provided by verbal instruction and written materials to support subject.  Instructor reviews cardiac drug classes: angiotensin converting enzyme inhibitors (ACE-I), angiotensin II receptor blockers (ARBs), nitrates, and calcium channel blockers.  Instructor discusses reasons, side effects, and lifestyle considerations for each drug class.   Anatomy and Physiology of the Circulatory System:  Group verbal and written instruction and models provide basic cardiac anatomy and physiology, with the coronary electrical and arterial systems. Review of: AMI, Angina, Valve disease, Heart Failure, Peripheral Artery Disease, Cardiac Arrhythmia, Pacemakers, and the ICD.   Other Education:  -Group or individual verbal, written, or video instructions that support the educational goals of the cardiac rehab program.   Holiday Eating Survival Tips:  -Group instruction provided by PowerPoint slides, verbal discussion, and written materials to support subject matter. The instructor gives patients tips, tricks, and techniques to help them not only survive but enjoy the holidays despite the onslaught of food that accompanies the holidays.   Knowledge Questionnaire  Score: Knowledge Questionnaire Score - 07/29/19 1200      Knowledge Questionnaire Score   Pre Score  16/24       Core Components/Risk Factors/Patient Goals at Admission: Personal Goals and Risk Factors at Admission - 07/29/19 1308      Core Components/Risk Factors/Patient Goals on Admission    Weight Management  Yes;Weight Loss    Intervention  Weight Management: Provide education and appropriate resources to help participant work on and attain dietary goals.;Weight Management: Develop a combined nutrition and exercise program designed to reach desired caloric intake, while maintaining appropriate intake of nutrient and fiber, sodium and fats, and appropriate energy expenditure required for the weight goal.    Admit Weight  219 lb 5.7 oz (99.5 kg)    Goal Weight: Short Term  213 lb (96.6 kg)    Goal Weight: Long Term  199 lb (90.3 kg)    Expected Outcomes  Short Term: Continue to assess and modify interventions until short term weight is achieved;Long Term: Adherence to nutrition and physical activity/exercise program aimed toward attainment of established weight goal;Weight Loss: Understanding of general recommendations for a balanced deficit meal plan, which promotes 1-2 lb weight loss per week and includes a negative energy balance of [phone] kcal/d;Understanding recommendations for meals to include 15-35% energy as protein, 25-35% energy from fat, 35-60% energy from carbohydrates, less than 200mg  of dietary cholesterol, 20-35 gm of total fiber daily;Understanding of distribution of calorie intake throughout the day with the consumption of 4-5 meals/snacks    Diabetes  Yes    Intervention  Provide education about signs/symptoms and action to take for hypo/hyperglycemia.;Provide education about proper nutrition, including hydration, and aerobic/resistive exercise prescription along with prescribed medications to achieve blood glucose in normal ranges: Fasting glucose 65-99 mg/dL    Expected  Outcomes  Short Term: Participant verbalizes understanding of the signs/symptoms and immediate care of hyper/hypoglycemia, proper foot care and importance of medication, aerobic/resistive exercise and nutrition plan for blood glucose control.;Long Term: Attainment of HbA1C < 7%.    Hypertension  Yes    Intervention  Provide education on lifestyle modifcations including regular physical activity/exercise, weight management, moderate sodium restriction and increased consumption of fresh fruit, vegetables, and low fat dairy, alcohol moderation, and smoking cessation.;Monitor prescription use compliance.    Expected Outcomes  Short Term: Continued assessment and intervention until BP is <  140/49mm HG in hypertensive participants. < 130/41mm HG in hypertensive participants with diabetes, heart failure or chronic kidney disease.;Long Term: Maintenance of blood pressure at goal levels.    Lipids  Yes    Intervention  Provide education and support for participant on nutrition & aerobic/resistive exercise along with prescribed medications to achieve LDL 70mg , HDL >40mg .    Expected Outcomes  Short Term: Participant states understanding of desired cholesterol values and is compliant with medications prescribed. Participant is following exercise prescription and nutrition guidelines.;Long Term: Cholesterol controlled with medications as prescribed, with individualized exercise RX and with personalized nutrition plan. Value goals: LDL < 70mg , HDL > 40 mg.       Core Components/Risk Factors/Patient Goals Review:  Goals and Risk Factor Review    Row Name 08/04/19 1122 08/19/19 1356           Core Components/Risk Factors/Patient Goals Review   Personal Goals Review  Weight Management/Obesity;Hypertension;Lipids;Diabetes  Weight Management/Obesity;Hypertension;Lipids;Diabetes      Review  Mr. Volner has multiple CAD risk factors. He is eager to participate in CR for lifestyle modifications. His short-term goal is  to improve his shortness of breath and stamina. Long-term he would like to loose weight (goal 199 lbs).  Mr. Sens has multiple CAD risk factors. He is eager to participate in CR for lifestyle modifications. His short-term goal is to improve his shortness of breath and stamina. He is very close to meeting this goal (see ITP comments) Long-term he would like to loose weight (goal 199 lbs).      Expected Outcomes  Patient will continue to participate in CR to improve stamina and strength and modify risk factors  Patient will continue to participate in CR to improve stamina and strength and modify risk factors         Core Components/Risk Factors/Patient Goals at Discharge (Final Review):  Goals and Risk Factor Review - 08/19/19 1356      Core Components/Risk Factors/Patient Goals Review   Personal Goals Review  Weight Management/Obesity;Hypertension;Lipids;Diabetes    Review  Mr. Hickingbottom has multiple CAD risk factors. He is eager to participate in CR for lifestyle modifications. His short-term goal is to improve his shortness of breath and stamina. He is very close to meeting this goal (see ITP comments) Long-term he would like to loose weight (goal 199 lbs).    Expected Outcomes  Patient will continue to participate in CR to improve stamina and strength and modify risk factors       ITP Comments: ITP Comments    Row Name 07/29/19 0752 08/04/19 1116 08/19/19 1348       ITP Comments  Dr. Fransico Him, Medical Director [facility]  Mr. Proffer completed his first CR exercise session today and tolerated very well. VSS. Denied cardiac complaints. He worked to an RPE of 9-11 with average mets of 2.0  30 day ITP review: Mr. Bartolotti continues to do well in cardiac rehab. VSS without cardiac complaints. He is self motivated to improve his health and requests appropriate workload increases. He consistantly works to an RPE of 11-13. He is on target to achieve his short-term goal of decreasing his shortness of  breath and improving his stamina. States he can now walk up the hill in his neighborhood without significant shortness of breath. This has greatly improved his confidence about his recovery and his quality of life. His average mets have increased to 3.0        Comments: see ITP comments

## 2019-08-20 ENCOUNTER — Other Ambulatory Visit: Payer: Self-pay

## 2019-08-20 ENCOUNTER — Encounter (HOSPITAL_COMMUNITY)
Admission: RE | Admit: 2019-08-20 | Discharge: 2019-08-20 | Disposition: A | Discharge status: Home / Self Care | Payer: Medicare Other | Source: Ambulatory Visit | Attending: Interventional Cardiology | Admitting: Interventional Cardiology

## 2019-08-20 ENCOUNTER — Ambulatory Visit (HOSPITAL_COMMUNITY): Payer: Medicare Other

## 2019-08-20 DIAGNOSIS — Z951 Presence of aortocoronary bypass graft: Secondary | ICD-10-CM | POA: Diagnosis not present

## 2019-08-21 ENCOUNTER — Other Ambulatory Visit: Payer: Self-pay | Admitting: Physician Assistant

## 2019-08-22 ENCOUNTER — Encounter (HOSPITAL_COMMUNITY)
Admission: RE | Admit: 2019-08-22 | Discharge: 2019-08-22 | Disposition: A | Discharge status: Home / Self Care | Payer: Medicare Other | Source: Ambulatory Visit | Attending: Interventional Cardiology | Admitting: Interventional Cardiology

## 2019-08-22 ENCOUNTER — Ambulatory Visit (HOSPITAL_COMMUNITY): Payer: Medicare Other

## 2019-08-22 ENCOUNTER — Other Ambulatory Visit: Payer: Self-pay

## 2019-08-22 DIAGNOSIS — Z951 Presence of aortocoronary bypass graft: Secondary | ICD-10-CM

## 2019-08-25 ENCOUNTER — Ambulatory Visit (HOSPITAL_COMMUNITY): Payer: Medicare Other

## 2019-08-25 ENCOUNTER — Other Ambulatory Visit: Payer: Self-pay

## 2019-08-25 ENCOUNTER — Encounter (HOSPITAL_COMMUNITY)
Admission: RE | Admit: 2019-08-25 | Discharge: 2019-08-25 | Disposition: A | Discharge status: Home / Self Care | Payer: Medicare Other | Source: Ambulatory Visit | Attending: Interventional Cardiology | Admitting: Interventional Cardiology

## 2019-08-25 DIAGNOSIS — Z951 Presence of aortocoronary bypass graft: Secondary | ICD-10-CM

## 2019-08-27 ENCOUNTER — Other Ambulatory Visit: Payer: Self-pay

## 2019-08-27 ENCOUNTER — Ambulatory Visit (HOSPITAL_COMMUNITY): Payer: Medicare Other

## 2019-08-27 ENCOUNTER — Encounter (HOSPITAL_COMMUNITY)
Admission: RE | Admit: 2019-08-27 | Discharge: 2019-08-27 | Disposition: A | Discharge status: Home / Self Care | Payer: Medicare Other | Source: Ambulatory Visit | Attending: Interventional Cardiology | Admitting: Interventional Cardiology

## 2019-08-27 DIAGNOSIS — Z951 Presence of aortocoronary bypass graft: Secondary | ICD-10-CM

## 2019-08-29 ENCOUNTER — Encounter (HOSPITAL_COMMUNITY)
Admission: RE | Admit: 2019-08-29 | Discharge: 2019-08-29 | Disposition: A | Discharge status: Home / Self Care | Payer: Medicare Other | Source: Ambulatory Visit | Attending: Interventional Cardiology | Admitting: Interventional Cardiology

## 2019-08-29 ENCOUNTER — Ambulatory Visit (HOSPITAL_COMMUNITY): Payer: Medicare Other

## 2019-08-29 ENCOUNTER — Other Ambulatory Visit: Payer: Self-pay

## 2019-08-29 DIAGNOSIS — Z951 Presence of aortocoronary bypass graft: Secondary | ICD-10-CM | POA: Diagnosis not present

## 2019-09-01 ENCOUNTER — Ambulatory Visit (HOSPITAL_COMMUNITY): Payer: Medicare Other

## 2019-09-01 ENCOUNTER — Other Ambulatory Visit: Payer: Self-pay

## 2019-09-01 ENCOUNTER — Encounter (HOSPITAL_COMMUNITY)
Admission: RE | Admit: 2019-09-01 | Discharge: 2019-09-01 | Disposition: A | Discharge status: Home / Self Care | Payer: Medicare Other | Source: Ambulatory Visit | Attending: Interventional Cardiology | Admitting: Interventional Cardiology

## 2019-09-01 DIAGNOSIS — Z951 Presence of aortocoronary bypass graft: Secondary | ICD-10-CM | POA: Diagnosis not present

## 2019-09-03 ENCOUNTER — Ambulatory Visit (HOSPITAL_COMMUNITY): Payer: Medicare Other

## 2019-09-03 ENCOUNTER — Other Ambulatory Visit: Payer: Self-pay

## 2019-09-03 ENCOUNTER — Encounter (HOSPITAL_COMMUNITY)
Admission: RE | Admit: 2019-09-03 | Discharge: 2019-09-03 | Disposition: A | Discharge status: Home / Self Care | Payer: Medicare Other | Source: Ambulatory Visit | Attending: Interventional Cardiology | Admitting: Interventional Cardiology

## 2019-09-03 DIAGNOSIS — Z951 Presence of aortocoronary bypass graft: Secondary | ICD-10-CM | POA: Diagnosis not present

## 2019-09-05 ENCOUNTER — Other Ambulatory Visit: Payer: Self-pay

## 2019-09-05 ENCOUNTER — Encounter (HOSPITAL_COMMUNITY)
Admission: RE | Admit: 2019-09-05 | Discharge: 2019-09-05 | Disposition: A | Discharge status: Home / Self Care | Payer: Medicare Other | Source: Ambulatory Visit | Attending: Interventional Cardiology | Admitting: Interventional Cardiology

## 2019-09-05 ENCOUNTER — Ambulatory Visit (HOSPITAL_COMMUNITY): Payer: Medicare Other

## 2019-09-05 DIAGNOSIS — Z951 Presence of aortocoronary bypass graft: Secondary | ICD-10-CM | POA: Diagnosis not present

## 2019-09-08 ENCOUNTER — Ambulatory Visit (HOSPITAL_COMMUNITY): Payer: Medicare Other

## 2019-09-08 ENCOUNTER — Other Ambulatory Visit: Payer: Self-pay

## 2019-09-08 ENCOUNTER — Encounter (HOSPITAL_COMMUNITY)
Admission: RE | Admit: 2019-09-08 | Discharge: 2019-09-08 | Disposition: A | Discharge status: Home / Self Care | Payer: Medicare Other | Source: Ambulatory Visit | Attending: Interventional Cardiology | Admitting: Interventional Cardiology

## 2019-09-08 DIAGNOSIS — Z951 Presence of aortocoronary bypass graft: Secondary | ICD-10-CM | POA: Diagnosis not present

## 2019-09-09 ENCOUNTER — Other Ambulatory Visit: Payer: Self-pay | Admitting: Physician Assistant

## 2019-09-10 ENCOUNTER — Encounter (HOSPITAL_COMMUNITY)
Admission: RE | Admit: 2019-09-10 | Discharge: 2019-09-10 | Disposition: A | Discharge status: Home / Self Care | Payer: Medicare Other | Source: Ambulatory Visit | Attending: Interventional Cardiology | Admitting: Interventional Cardiology

## 2019-09-10 ENCOUNTER — Other Ambulatory Visit: Payer: Self-pay

## 2019-09-10 ENCOUNTER — Ambulatory Visit (HOSPITAL_COMMUNITY): Payer: Medicare Other

## 2019-09-10 DIAGNOSIS — Z951 Presence of aortocoronary bypass graft: Secondary | ICD-10-CM

## 2019-09-12 ENCOUNTER — Other Ambulatory Visit: Payer: Self-pay

## 2019-09-12 ENCOUNTER — Encounter (HOSPITAL_COMMUNITY)
Admission: RE | Admit: 2019-09-12 | Discharge: 2019-09-12 | Disposition: A | Discharge status: Home / Self Care | Payer: Medicare Other | Source: Ambulatory Visit | Attending: Interventional Cardiology | Admitting: Interventional Cardiology

## 2019-09-12 ENCOUNTER — Ambulatory Visit (HOSPITAL_COMMUNITY): Payer: Medicare Other

## 2019-09-12 DIAGNOSIS — Z951 Presence of aortocoronary bypass graft: Secondary | ICD-10-CM

## 2019-09-15 ENCOUNTER — Other Ambulatory Visit: Payer: Self-pay

## 2019-09-15 ENCOUNTER — Encounter (HOSPITAL_COMMUNITY)
Admission: RE | Admit: 2019-09-15 | Discharge: 2019-09-15 | Disposition: A | Discharge status: Home / Self Care | Payer: Medicare Other | Source: Ambulatory Visit | Attending: Interventional Cardiology | Admitting: Interventional Cardiology

## 2019-09-15 ENCOUNTER — Telehealth: Payer: Self-pay | Admitting: *Deleted

## 2019-09-15 DIAGNOSIS — H3530 Unspecified macular degeneration: Secondary | ICD-10-CM | POA: Diagnosis not present

## 2019-09-15 DIAGNOSIS — I10 Essential (primary) hypertension: Secondary | ICD-10-CM | POA: Diagnosis not present

## 2019-09-15 DIAGNOSIS — Z7984 Long term (current) use of oral hypoglycemic drugs: Secondary | ICD-10-CM | POA: Diagnosis not present

## 2019-09-15 DIAGNOSIS — Z951 Presence of aortocoronary bypass graft: Secondary | ICD-10-CM | POA: Diagnosis not present

## 2019-09-15 DIAGNOSIS — Z8546 Personal history of malignant neoplasm of prostate: Secondary | ICD-10-CM | POA: Diagnosis not present

## 2019-09-15 DIAGNOSIS — Z7901 Long term (current) use of anticoagulants: Secondary | ICD-10-CM | POA: Insufficient documentation

## 2019-09-15 DIAGNOSIS — Z79899 Other long term (current) drug therapy: Secondary | ICD-10-CM | POA: Diagnosis not present

## 2019-09-15 DIAGNOSIS — Z7982 Long term (current) use of aspirin: Secondary | ICD-10-CM | POA: Diagnosis not present

## 2019-09-15 DIAGNOSIS — E119 Type 2 diabetes mellitus without complications: Secondary | ICD-10-CM | POA: Insufficient documentation

## 2019-09-15 DIAGNOSIS — I4891 Unspecified atrial fibrillation: Secondary | ICD-10-CM | POA: Insufficient documentation

## 2019-09-15 DIAGNOSIS — Z87891 Personal history of nicotine dependence: Secondary | ICD-10-CM | POA: Insufficient documentation

## 2019-09-15 DIAGNOSIS — E7800 Pure hypercholesterolemia, unspecified: Secondary | ICD-10-CM | POA: Diagnosis not present

## 2019-09-15 DIAGNOSIS — G4730 Sleep apnea, unspecified: Secondary | ICD-10-CM | POA: Insufficient documentation

## 2019-09-15 NOTE — Telephone Encounter (Signed)
S/w pt is aware lasix's has been D/C'd. Pt does not have anymore lasix. Pt is aware of Lori's recommendation's.

## 2019-09-15 NOTE — Telephone Encounter (Signed)
-----   Message from Burtis Junes, NP sent at 09/14/2019  8:07 PM EDT ----- Let's stop the Lasix.   Restrict salt.  Monitor weight and BP and return of any symptoms of shortness of breath.   [name] ----- Message ----- From: Sharmon Revere Sent: 09/11/2019   8:01 AM EDT To: Margit Hanks, RN, Burtis Junes, NP  Hi Abby Potash I am covering for Gray while she is away. I reviewed all of her notes.  I see where she stopped his Lasix a couple months ago.  I could never find where it was resumed.  Since he has been taking it, I recommend he continue it until [name] gets back and can address. Scott ----- Message ----- From: Margit Hanks, RN Sent: 09/10/2019   3:06 PM EDT To: Burtis Junes, NP  He has called wanting to know if he should continue his Lasix/K.  I told him I would contact you as soon as you returned to ask your advice.  His last CXR did show tiny bibasilar pl effusions.  Please let him know.  Thanks and I envy you this week.  Thanks, Abby Potash

## 2019-09-16 NOTE — Progress Notes (Signed)
Cardiac Individual Treatment Plan  Patient Details  Name: [name] MRN: [id number] Date of Birth: 09/27/1945 Referring Provider:     CARDIAC REHAB PHASE II ORIENTATION from 07/29/2019 in [facility]  Referring Provider  Gerhardt, Edward, MD      Initial Encounter Date:    CARDIAC REHAB PHASE II ORIENTATION from 07/29/2019 in [facility]  Date  07/29/19      Visit Diagnosis: S/P CABG x 3  Patient's Home Medications on Admission:  Current Outpatient Medications:  .  acetaminophen (TYLENOL) 325 MG tablet, Take 2 tablets (650 mg total) by mouth every 6 (six) hours as needed for mild pain., Disp:  , Rfl:  .  allopurinol (ZYLOPRIM) 300 MG tablet, Take 300 mg by mouth daily., Disp: , Rfl:  .  aspirin EC 81 MG EC tablet, Take 1 tablet (81 mg total) by mouth daily., Disp: 30 tablet, Rfl: 0 .  atorvastatin (LIPITOR) 40 MG tablet, TAKE 1 TABLET (40 MG TOTAL) BY MOUTH DAILY AT 6 PM., Disp: 30 tablet, Rfl: 1 .  ELIQUIS 5 MG TABS tablet, TAKE 1 TABLET BY MOUTH TWO  TIMES DAILY, Disp: 180 tablet, Rfl: 2 .  furosemide (LASIX) 20 MG tablet, Take 1 tablet (20 mg total) by mouth daily., Disp: 30 tablet, Rfl: 1 .  losartan (COZAAR) 50 MG tablet, Take 50 mg by mouth 2 (two) times daily., Disp: , Rfl:  .  metFORMIN (GLUCOPHAGE) 1000 MG tablet, Take 500 mg by mouth daily with breakfast., Disp: , Rfl:  .  metoprolol tartrate (LOPRESSOR) 25 MG tablet, Take 1 tablet (25 mg total) by mouth 2 (two) times daily., Disp: 180 tablet, Rfl: 3 .  Multiple Vitamin (MULTIVITAMIN WITH MINERALS) TABS tablet, Take 1 tablet by mouth daily. Centrum Silver, Disp: , Rfl:  .  Multiple Vitamins-Minerals (PRESERVISION AREDS 2) CAPS, Take 1 capsule by mouth 2 (two) times daily. , Disp: , Rfl:  .  potassium chloride (KLOR-CON) 10 MEQ tablet, Take 1 tablet (10 mEq total) by mouth daily., Disp: 30 tablet, Rfl: 1 .  traMADol (ULTRAM) 50 MG tablet, Take 1 tablet (50 mg  total) by mouth every 6 (six) hours as needed., Disp: 28 tablet, Rfl: 0  Past Medical History: Past Medical History:  Diagnosis Date  . A-fib (HCC)   . Arrhythmia    hx. Atrial Fib. ,"with regurgitation"  . Depression   . Diabetes mellitus   . Dysrhythmia    a. fib-on Eliquis  . Heart murmur   . Hx of echocardiogram    Echo 3/16:  Mild LVH, EF 60-65%, mild MR, severe LAE, mild RAE, PASP 32 mmHg  . Hypercholesteremia   . Hypertension   . Macular degeneration of both eyes   . OSA on CPAP   . Osteoarthritis (arthritis due to wear and tear of joints)   . Palpitations   . 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 Sleep apnea    uses CPAP-settings 5  . 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 Ureter, stricture    hx. 2 yrs ago-stent has been removed now    Tobacco Use: Social History   Tobacco Use  Smoking Status Former Smoker  . Quit date: 05/15/1973  . Years since quitting: 46.3  Smokeless Tobacco Never Used    Labs: Recent Review Flowsheet Data    Labs for ITP Cardiac and Pulmonary Rehab Latest Ref Rng & Units 05/27/2019   05/27/2019 05/27/2019 05/27/2019 05/27/2019   Cholestrol 100 - 199 mg/dL - - - - -   LDLCALC 0 - 99 mg/dL - - - - -   HDL >39 mg/dL - - - - -   Trlycerides 0 - 149 mg/dL - - - - -   Hemoglobin A1c 4.8 - 5.6 % - - - - -   PHART 7.350 - 7.450 - 7.334(L) 7.426 7.393 7.412   PCO2ART 32.0 - 48.0 mmHg - 45.3 35.0 36.5 32.1   HCO3 20.0 - 28.0 mmol/L - 24.1 23.5 22.2 21.0   TCO2 22 - 32 mmol/L 23 25 25 23 22   ACIDBASEDEF 0.0 - 2.0 mmol/L - 2.0 1.0 2.0 4.0(H)   O2SAT % - 97.0 98.0 98.0 98.0      Capillary Blood Glucose: Lab Results  Component Value Date   GLUCAP 108 (H) 08/08/2019   GLUCAP 100 (H) 08/08/2019   GLUCAP 87 08/06/2019   GLUCAP 102 (H) 08/04/2019   GLUCAP 137 (H) 08/04/2019     Exercise Target Goals: Exercise Program Goal: Individual exercise prescription set using results from initial 6 min walk test and THRR while  considering  patient's activity barriers and safety.   Exercise Prescription Goal: Initial exercise prescription builds to 30-45 minutes a day of aerobic activity, 2-3 days per week.  Home exercise guidelines will be given to patient during program as part of exercise prescription that the participant will acknowledge.  Activity Barriers & Risk Stratification: Activity Barriers & Cardiac Risk Stratification - 07/29/19 0754      Activity Barriers & Cardiac Risk Stratification   Activity Barriers  Left Knee Replacement;Assistive Device;Other (comment);Shortness of Breath    Comments  Left rotator cuff repair    Cardiac Risk Stratification  High       6 Minute Walk: 6 Minute Walk    Row Name 07/29/19 0801         6 Minute Walk   Phase  Initial     Distance  1485 feet     Walk Time  6 minutes     # of Rest Breaks  0     MPH  2.81     METS  2.72     RPE  12     Perceived Dyspnea   0     VO2 Peak  9.52     Symptoms  No     Resting HR  63 bpm     Resting BP  108/60     Resting Oxygen Saturation   97 %     Exercise Oxygen Saturation  during 6 min walk  99 %     Max Ex. HR  97 bpm     Max Ex. BP  122/82     2 Minute Post BP  104/76        Oxygen Initial Assessment:   Oxygen Re-Evaluation:   Oxygen Discharge (Final Oxygen Re-Evaluation):   Initial Exercise Prescription: Initial Exercise Prescription - 07/29/19 1100      Date of Initial Exercise RX and Referring Provider   Date  07/29/19    Referring Provider  Gerhardt, Edward, MD    Expected Discharge Date  09/26/19      Bike   Level  2.5    Minutes  15    METs  2.7      NuStep   Level  3    SPM  85    Minutes  15    METs    2.7      Prescription Details   Frequency (times per week)  3    Duration  Progress to 30 minutes of continuous aerobic without signs/symptoms of physical distress      Intensity   THRR 40-80% of Max Heartrate  58-116    Ratings of Perceived Exertion  11-13    Perceived Dyspnea   0-4      Progression   Progression  Continue to progress workloads to maintain intensity without signs/symptoms of physical distress.      Resistance Training   Training Prescription  Yes    Weight  3lbs.    Reps  10-15       Perform Capillary Blood Glucose checks as needed.  Exercise Prescription Changes:  Exercise Prescription Changes    Row Name 08/04/19 0719 08/18/19 0719 09/08/19 0722 09/15/19 0722       Response to Exercise   Blood Pressure (Admit)  120/70  120/72  104/70  124/70    Blood Pressure (Exercise)  144/80  136/74  122/82  140/82    Blood Pressure (Exit)  128/82  102/78  120/82  128/82    Heart Rate (Admit)  69 bpm  69 bpm  82 bpm  65 bpm    Heart Rate (Exercise)  96 bpm  96 bpm  96 bpm  92 bpm    Heart Rate (Exit)  67 bpm  69 bpm  78 bpm  74 bpm    Rating of Perceived Exertion (Exercise)  11  14  11  11    Symptoms  None  None  None  None    Comments  Off to a good start with exercise.  --  --  --    Duration  Continue with 30 min of aerobic exercise without signs/symptoms of physical distress.  Continue with 30 min of aerobic exercise without signs/symptoms of physical distress.  Continue with 30 min of aerobic exercise without signs/symptoms of physical distress.  Continue with 30 min of aerobic exercise without signs/symptoms of physical distress.    Intensity  THRR unchanged  THRR unchanged  THRR unchanged  THRR unchanged      Progression   Progression  Continue to progress workloads to maintain intensity without signs/symptoms of physical distress.  Continue to progress workloads to maintain intensity without signs/symptoms of physical distress.  Continue to progress workloads to maintain intensity without signs/symptoms of physical distress.  Continue to progress workloads to maintain intensity without signs/symptoms of physical distress.    Average METs  2.6  3.3  3.6  4.2      Resistance Training   Training Prescription  Yes  Yes  Yes  Yes    Weight   3lbs.  4lbs  5lbs  5lbs    Reps  10-15  10-15  10-15  10-15    Time  10 Minutes  10 Minutes  10 Minutes  10 Minutes      Interval Training   Interval Training  No  No  No  No      Bike   Level  2.5  4  4  4.4    Minutes  15  15  15  15    METs  3.2  4.5  4.4  5.7      NuStep   Level  3  4  5  5    SPM  85  85  85  85    Minutes  15  15    15  15    METs  2  2.5  2.7  2.7      Home Exercise Plan   Plans to continue exercise at  --  Home (comment) Walking  Home (comment) Walking  Home (comment) Walking    Frequency  --  Add 3 additional days to program exercise sessions.  Add 3 additional days to program exercise sessions.  Add 3 additional days to program exercise sessions.    Initial Home Exercises Provided  --  08/18/19  08/18/19  08/18/19       Exercise Comments:  Exercise Comments    Row Name 08/04/19 0820 08/18/19 0732 09/15/19 0720 09/17/19 0720     Exercise Comments  Patient tolerated first session of exercise well wihtout symtoms.  Reviewed home exercise guidelines, METs, and goals with patient.  Reviewed goals with patient.  Reviewed METs with patient. Patient will graduate next week and [facility]       Exercise Goals and Review:  Exercise Goals    Row Name 07/29/19 0753             Exercise Goals   Increase Physical Activity  Yes       Intervention  Provide advice, education, support and counseling about physical activity/exercise needs.;Develop an individualized exercise prescription for aerobic and resistive training based on initial evaluation findings, risk stratification, comorbidities and participant's personal goals.       Expected Outcomes  Short Term: Attend rehab on a regular basis to increase amount of physical activity.;Long Term: Exercising regularly at least 3-5 days a week.;Long Term: Add in home exercise to make exercise part of routine and to increase amount of physical activity.       Increase  Strength and Stamina  Yes       Intervention  Provide advice, education, support and counseling about physical activity/exercise needs.;Develop an individualized exercise prescription for aerobic and resistive training based on initial evaluation findings, risk stratification, comorbidities and participant's personal goals.       Expected Outcomes  Short Term: Increase workloads from initial exercise prescription for resistance, speed, and METs.;Short Term: Perform resistance training exercises routinely during rehab and add in resistance training at home;Long Term: Improve cardiorespiratory fitness, muscular endurance and strength as measured by increased METs and functional capacity (6MWT)       Able to understand and use rate of perceived exertion (RPE) scale  Yes       Intervention  Provide education and explanation on how to use RPE scale       Expected Outcomes  Short Term: Able to use RPE daily in rehab to express subjective intensity level;Long Term:  Able to use RPE to guide intensity level when exercising independently       Knowledge and understanding of Target Heart Rate Range (THRR)  Yes       Intervention  Provide education and explanation of THRR including how the numbers were predicted and where they are located for reference       Expected Outcomes  Short Term: Able to state/look up THRR;Long Term: Able to use THRR to govern intensity when exercising independently;Short Term: Able to use daily as guideline for intensity in rehab       Able to check pulse independently  Yes       Intervention  Provide education and demonstration on how to check pulse in carotid and radial arteries.;Review the importance of being able   to check your own pulse for safety during independent exercise       Expected Outcomes  Short Term: Able to explain why pulse checking is important during independent exercise;Long Term: Able to check pulse independently and accurately       Understanding of Exercise  Prescription  Yes       Intervention  Provide education, explanation, and written materials on patient's individual exercise prescription       Expected Outcomes  Short Term: Able to explain program exercise prescription;Long Term: Able to explain home exercise prescription to exercise independently          Exercise Goals Re-Evaluation : Exercise Goals Re-Evaluation    Row Name 08/04/19 0820 08/18/19 0732 09/15/19 0720         Exercise Goal Re-Evaluation   Exercise Goals Review  Increase Physical Activity;Able to understand and use rate of perceived exertion (RPE) scale  Increase Physical Activity;Able to understand and use rate of perceived exertion (RPE) scale;Understanding of Exercise Prescription;Knowledge and understanding of Target Heart Rate Range (THRR);Able to check pulse independently;Increase Strength and Stamina  Increase Physical Activity;Able to understand and use rate of perceived exertion (RPE) scale;Understanding of Exercise Prescription;Knowledge and understanding of Target Heart Rate Range (THRR);Able to check pulse independently;Increase Strength and Stamina     Comments  Patient able to understand and use RPE scale appropriately. Patient has a pool at home and plans to walk laps in the pool as one mode of exercise once the weather get warmer.  Reviewed home exercise guidelines with patient including THRR, RPE scale, temperature precautions, and endpoints for exercise. Instructed patient on pulse counting. Patient is walking 30 minutes on Tuesdays and Thursdays and tries to walk during the weekend as well, as his mode of home exercise. Patient states that he is now able to walk up a hill on his path twice during his walk, which was a goal for him. Patient would also like to lose 5 lbs. Increased workload on the bike today and increased from 3lb. to 4lb. hand weights today.  Patient is stll walking 30 minutes but states it's sporadic. Patient states that he feel he can breath better  now, which was his goal and he's made friends during his time in cardiac rehab.     Expected Outcomes  Progress workloads to help patient achieve personal health and fitness goals.  Continue to increase workloads as tolerated to help achieve personal health and fitness goals.  Patient will work on walking 30 minutes consistently at home in addition to exercise at cardaic rehab.        Discharge Exercise Prescription (Final Exercise Prescription Changes): Exercise Prescription Changes - 09/15/19 0722      Response to Exercise   Blood Pressure (Admit)  124/70    Blood Pressure (Exercise)  140/82    Blood Pressure (Exit)  128/82    Heart Rate (Admit)  65 bpm    Heart Rate (Exercise)  92 bpm    Heart Rate (Exit)  74 bpm    Rating of Perceived Exertion (Exercise)  11    Symptoms  None    Duration  Continue with 30 min of aerobic exercise without signs/symptoms of physical distress.    Intensity  THRR unchanged      Progression   Progression  Continue to progress workloads to maintain intensity without signs/symptoms of physical distress.    Average METs  4.2      Resistance Training   Training Prescription  Yes      Weight  5lbs    Reps  10-15    Time  10 Minutes      Interval Training   Interval Training  No      Bike   Level  4.4    Minutes  15    METs  5.7      NuStep   Level  5    SPM  85    Minutes  15    METs  2.7      Home Exercise Plan   Plans to continue exercise at  Home (comment)   Walking   Frequency  Add 3 additional days to program exercise sessions.    Initial Home Exercises Provided  08/18/19       Nutrition:  Target Goals: Understanding of nutrition guidelines, daily intake of sodium <1500mg, cholesterol <200mg, calories 30% from fat and 7% or less from saturated fats, daily to have 5 or more servings of fruits and vegetables.  Biometrics: Pre Biometrics - 07/29/19 0752      Pre Biometrics   Waist Circumference  41.5 inches    Hip Circumference   46.5 inches    Waist to Hip Ratio  0.89 %    Triceps Skinfold  12 mm    % Body Fat  28.3 %    Grip Strength  45 kg    Flexibility  0 in    Single Leg Stand  7.68 seconds        Nutrition Therapy Plan and Nutrition Goals: Nutrition Therapy & Goals - 08/04/19 0928      Nutrition Therapy   Diet  Heart Healthy      Personal Nutrition Goals   Nutrition Goal  Pt to identify food quantities necessary to achieve weight loss of 6-24 lb at graduation from cardiac rehab.    Personal Goal #2  Pt to build a healthy plate including vegetables, fruits, whole grains, and low-fat dairy products in a heart healthy meal plan.    Personal Goal #3  Pt able to name foods that affect blood glucose      Intervention Plan   Intervention  Prescribe, educate and counsel regarding individualized specific dietary modifications aiming towards targeted core components such as weight, hypertension, lipid management, diabetes, heart failure and other comorbidities.;Nutrition handout(s) given to patient.    Expected Outcomes  Short Term Goal: A plan has been developed with personal nutrition goals set during dietitian appointment.;Long Term Goal: Adherence to prescribed nutrition plan.       Nutrition Assessments: Nutrition Assessments - 09/18/19 0819      MEDFICTS Scores   Post Score  18       Nutrition Goals Re-Evaluation: Nutrition Goals Re-Evaluation    Row Name 08/04/19 0929 09/17/19 0823           Goals   Current Weight  221 lb (100.2 kg)  221 lb (100.2 kg)      Nutrition Goal  Pt to identify food quantities necessary to achieve weight loss of 6-24 lb at graduation from cardiac rehab.  Pt to identify food quantities necessary to achieve weight loss of 6-24 lb at graduation from cardiac rehab.        Personal Goal #2 Re-Evaluation   Personal Goal #2  Pt to build a healthy plate including vegetables, fruits, whole grains, and low-fat dairy products in a heart healthy meal plan.  Pt to build a  healthy plate including vegetables, fruits, whole grains, and low-fat dairy products in a heart   healthy meal plan.        Personal Goal #3 Re-Evaluation   Personal Goal #3  Pt able to name foods that affect blood glucose  Pt able to name foods that affect blood glucose         Nutrition Goals Re-Evaluation: Nutrition Goals Re-Evaluation    [place] Name 08/04/19 0929 09/17/19 0823           Goals   Current Weight  221 lb (100.2 kg)  221 lb (100.2 kg)      Nutrition Goal  Pt to identify food quantities necessary to achieve weight loss of 6-24 lb at graduation from cardiac rehab.  Pt to identify food quantities necessary to achieve weight loss of 6-24 lb at graduation from cardiac rehab.        Personal Goal #2 Re-Evaluation   Personal Goal #2  Pt to build a healthy plate including vegetables, fruits, whole grains, and low-fat dairy products in a heart healthy meal plan.  Pt to build a healthy plate including vegetables, fruits, whole grains, and low-fat dairy products in a heart healthy meal plan.        Personal Goal #3 Re-Evaluation   Personal Goal #3  Pt able to name foods that affect blood glucose  Pt able to name foods that affect blood glucose         Nutrition Goals Discharge (Final Nutrition Goals Re-Evaluation): Nutrition Goals Re-Evaluation - 09/17/19 6270      Goals   Current Weight  221 lb (100.2 kg)    Nutrition Goal  Pt to identify food quantities necessary to achieve weight loss of 6-24 lb at graduation from cardiac rehab.      Personal Goal #2 Re-Evaluation   Personal Goal #2  Pt to build a healthy plate including vegetables, fruits, whole grains, and low-fat dairy products in a heart healthy meal plan.      Personal Goal #3 Re-Evaluation   Personal Goal #3  Pt able to name foods that affect blood glucose       Psychosocial: Target Goals: Acknowledge presence or absence of significant depression and/or stress, maximize coping skills, provide positive support  system. Participant is able to verbalize types and ability to use techniques and skills needed for reducing stress and depression.  Initial Review & Psychosocial Screening: Initial Psych Review & Screening - 07/29/19 0756      Initial Review   Current issues with  None Identified      Family Dynamics   Good Support System?  Yes    Concerns  Recent loss of child    Comments  [name] has a positive outlook and attitude. He is excited about participation in CR as he feels exercise and outdoors helps with his emotional health. He has a very supportive wife and belongs to a band. 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 His son recently passed away in 2019/06/02 of renal disease but he is dealing with his grief in a healthy way by gardening with his wife outdoors now that the weather is warmer. No interventions needed at this time as no barriers to participation in CR identified      Barriers   Psychosocial barriers to participate in program  There are no identifiable barriers or psychosocial needs.      Screening Interventions   Interventions  Encouraged to exercise       Quality of Life Scores: Quality of Life -  07/29/19 1200      Quality of Life   Select  Quality of Life      Quality of Life Scores   Health/Function Pre  19.47 %    Socioeconomic Pre  23.5 %    Psych/Spiritual Pre  22.93 %    Family Pre  20.75 %    GLOBAL Pre  21.14 %      Scores of 19 and below usually indicate a poorer quality of life in these areas.  A difference of  2-3 points is a clinically meaningful difference.  A difference of 2-3 points in the total score of the Quality of Life Index has been associated with significant improvement in overall quality of life, self-image, physical symptoms, and general health in studies assessing change in quality of life.  PHQ-9: Recent Review Flowsheet Data    Depression screen [facility] 2/9 07/29/2019 07/29/2019   Decreased Interest - 0    Down, Depressed, Hopeless (No Data)  1   PHQ - 2 Score - 1     Interpretation of Total Score  Total Score Depression Severity:  1-4 = Minimal depression, 5-9 = Mild depression, 10-14 = Moderate depression, 15-19 = Moderately severe depression, 20-27 = Severe depression   Psychosocial Evaluation and Intervention: Psychosocial Evaluation - 08/04/19 1118      Psychosocial Evaluation & Interventions   Interventions  Encouraged to exercise with the program and follow exercise prescription    Comments  [name] continues to have a positive outlook and attitude related to his health. He and wife recently lost their adult child to chronic kidney disease. They cope with their loss together by being outdoors and gardening. He is alsop    Expected Outcomes  [name] will continue to utilize his support system and hobbies to deal with the loss of his son. He will continue to have a positive attitude and outlook related to his health.    Continue Psychosocial Services   Follow up required by staff       Psychosocial Re-Evaluation: Psychosocial Re-Evaluation    [place] Name 08/19/19 1353 09/16/19 1110           Psychosocial Re-Evaluation   Current issues with  None Identified  --      Comments  [name] continues to have a positive outlook and attitude. He is excited about the warmer weather as one of his hobbies is gardening with his wife and being outdoors. He also enjoys playing in a band and looking forward to everyones Covid vaccination so they can once again play together. No barriers to self health management or participation in cardiac rehab identified. No interventions needed.  Sevastian continues to have a positive attitude and outlook. He is enjoying the warmer weather as it allows him to garden with his wife. 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 Denies psychosocial barriers to self health management and participation in CR      Expected Outcomes  Patient will continue to have a positive  attitude and outlook. He will utilize his support system and hobbies to deal with any stressors that may arise.  Patient will continue to have a positive attitude and outlook. He will utilize his support system and hobbies to deal with any stressors that may arise.      Interventions  Encouraged to attend Cardiac Rehabilitation for the exercise  Encouraged to attend Cardiac Rehabilitation for the exercise      Continue Psychosocial Services   No Follow up required  No Follow up required         Psychosocial Discharge (Final Psychosocial Re-Evaluation): Psychosocial Re-Evaluation - 09/16/19 1110      Psychosocial Re-Evaluation   Comments  [name] continues to have a positive attitude and outlook. He is enjoying the warmer weather as it allows him to garden with his wife. 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 Denies psychosocial barriers to self health management and participation in CR    Expected Outcomes  Patient will continue to have a positive attitude and outlook. He will utilize his support system and hobbies to deal with any stressors that may arise.    Interventions  Encouraged to attend Cardiac Rehabilitation for the exercise    Continue Psychosocial Services   No Follow up required       Vocational Rehabilitation: Provide vocational rehab assistance to qualifying candidates.   Vocational Rehab Evaluation & Intervention: Vocational Rehab - 07/29/19 1337      Initial Vocational Rehab Evaluation & Intervention   Assessment shows need for Vocational Rehabilitation  No       Education: Education Goals: Education classes will be provided on a weekly basis, covering required topics. Participant will state understanding/return demonstration of topics presented.  Learning Barriers/Preferences:   Education Topics: Count Your Pulse:  -Group instruction provided by verbal instruction, demonstration, patient participation and written materials to support subject.  Instructors address  importance of being able to find your pulse and how to count your pulse when at home without a heart monitor.  Patients get hands on experience counting their pulse with staff help and individually.   Heart Attack, Angina, and Risk Factor Modification:  -Group instruction provided by verbal instruction, video, and written materials to support subject.  Instructors address signs and symptoms of angina and heart attacks.    Also discuss risk factors for heart disease and how to make changes to improve heart health risk factors.   Functional Fitness:  -Group instruction provided by verbal instruction, demonstration, patient participation, and written materials to support subject.  Instructors address safety measures for doing things around the house.  Discuss how to get up and down off the floor, how to pick things up properly, how to safely get out of a chair without assistance, and balance training.   Meditation and Mindfulness:  -Group instruction provided by verbal instruction, patient participation, and written materials to support subject.  Instructor addresses importance of mindfulness and meditation practice to help reduce stress and improve awareness.  Instructor also leads participants through a meditation exercise.    Stretching for Flexibility and Mobility:  -Group instruction provided by verbal instruction, patient participation, and written materials to support subject.  Instructors lead participants through series of stretches that are designed to increase flexibility thus improving mobility.  These stretches are additional exercise for major muscle groups that are typically performed during regular warm up and cool down.   Hands Only CPR:  -Group verbal, video, and participation provides a basic overview of AHA guidelines for community CPR. Role-play of emergencies allow participants the opportunity to practice calling for help and chest compression technique with discussion of AED  use.   Hypertension: -Group verbal and written instruction that provides a basic overview of hypertension including the most recent diagnostic guidelines, risk factor reduction with self-care instructions and medication management.    Nutrition I class: Heart Healthy Eating:  -Group instruction provided by PowerPoint slides, verbal discussion, and written materials to support subject matter. The instructor gives an explanation and   review of the Therapeutic Lifestyle Changes diet recommendations, which includes a discussion on lipid goals, dietary fat, sodium, fiber, plant stanol/sterol esters, sugar, and the components of a well-balanced, healthy diet.   Nutrition II class: Lifestyle Skills:  -Group instruction provided by PowerPoint slides, verbal discussion, and written materials to support subject matter. The instructor gives an explanation and review of label reading, grocery shopping for heart health, heart healthy recipe modifications, and ways to make healthier choices when eating out.   Diabetes Question & Answer:  -Group instruction provided by PowerPoint slides, verbal discussion, and written materials to support subject matter. The instructor gives an explanation and review of diabetes co-morbidities, pre- and post-prandial blood glucose goals, pre-exercise blood glucose goals, signs, symptoms, and treatment of hypoglycemia and hyperglycemia, and foot care basics.   Diabetes Blitz:  -Group instruction provided by PowerPoint slides, verbal discussion, and written materials to support subject matter. The instructor gives an explanation and review of the physiology behind type 1 and type 2 diabetes, diabetes medications and rational behind using different medications, pre- and post-prandial blood glucose recommendations and Hemoglobin A1c goals, diabetes diet, and exercise including blood glucose guidelines for exercising safely.    Portion Distortion:  -Group instruction provided by  PowerPoint slides, verbal discussion, written materials, and food models to support subject matter. The instructor gives an explanation of serving size versus portion size, changes in portions sizes over the last 20 years, and what consists of a serving from each food group.   Stress Management:  -Group instruction provided by verbal instruction, video, and written materials to support subject matter.  Instructors review role of stress in heart disease and how to cope with stress positively.     Exercising on Your Own:  -Group instruction provided by verbal instruction, power point, and written materials to support subject.  Instructors discuss benefits of exercise, components of exercise, frequency and intensity of exercise, and end points for exercise.  Also discuss use of nitroglycerin and activating EMS.  Review options of places to exercise outside of rehab.  Review guidelines for sex with heart disease.   Cardiac Drugs I:  -Group instruction provided by verbal instruction and written materials to support subject.  Instructor reviews cardiac drug classes: antiplatelets, anticoagulants, beta blockers, and statins.  Instructor discusses reasons, side effects, and lifestyle considerations for each drug class.   Cardiac Drugs II:  -Group instruction provided by verbal instruction and written materials to support subject.  Instructor reviews cardiac drug classes: angiotensin converting enzyme inhibitors (ACE-I), angiotensin II receptor blockers (ARBs), nitrates, and calcium channel blockers.  Instructor discusses reasons, side effects, and lifestyle considerations for each drug class.   Anatomy and Physiology of the Circulatory System:  Group verbal and written instruction and models provide basic cardiac anatomy and physiology, with the coronary electrical and arterial systems. Review of: AMI, Angina, Valve disease, Heart Failure, Peripheral Artery Disease, Cardiac Arrhythmia, Pacemakers, and  the ICD.   Other Education:  -Group or individual verbal, written, or video instructions that support the educational goals of the cardiac rehab program.   Holiday Eating Survival Tips:  -Group instruction provided by PowerPoint slides, verbal discussion, and written materials to support subject matter. The instructor gives patients tips, tricks, and techniques to help them not only survive but enjoy the holidays despite the onslaught of food that accompanies the holidays.   Knowledge Questionnaire Score: Knowledge Questionnaire Score - 09/17/19 1020      Knowledge Questionnaire Score   Pre Score  16/24  Post Score  22/24       Core Components/Risk Factors/Patient Goals at Admission: Personal Goals and Risk Factors at Admission - 07/29/19 1308      Core Components/Risk Factors/Patient Goals on Admission    Weight Management  Yes;Weight Loss    Intervention  Weight Management: Provide education and appropriate resources to help participant work on and attain dietary goals.;Weight Management: Develop a combined nutrition and exercise program designed to reach desired caloric intake, while maintaining appropriate intake of nutrient and fiber, sodium and fats, and appropriate energy expenditure required for the weight goal.    Admit Weight  219 lb 5.7 oz (99.5 kg)    Goal Weight: Short Term  213 lb (96.6 kg)    Goal Weight: Long Term  199 lb (90.3 kg)    Expected Outcomes  Short Term: Continue to assess and modify interventions until short term weight is achieved;Long Term: Adherence to nutrition and physical activity/exercise program aimed toward attainment of established weight goal;Weight Loss: Understanding of general recommendations for a balanced deficit meal plan, which promotes 1-2 lb weight loss per week and includes a negative energy balance of [id number] kcal/d;Understanding recommendations for meals to include 15-35% energy as protein, 25-35% energy from fat, 35-60% energy from  carbohydrates, less than 226m of dietary cholesterol, 20-35 gm of total fiber daily;Understanding of distribution of calorie intake throughout the day with the consumption of 4-5 meals/snacks    Diabetes  Yes    Intervention  Provide education about signs/symptoms and action to take for hypo/hyperglycemia.;Provide education about proper nutrition, including hydration, and aerobic/resistive exercise prescription along with prescribed medications to achieve blood glucose in normal ranges: Fasting glucose 65-99 mg/dL    Expected Outcomes  Short Term: Participant verbalizes understanding of the signs/symptoms and immediate care of hyper/hypoglycemia, proper foot care and importance of medication, aerobic/resistive exercise and nutrition plan for blood glucose control.;Long Term: Attainment of HbA1C < 7%.    Hypertension  Yes    Intervention  Provide education on lifestyle modifcations including regular physical activity/exercise, weight management, moderate sodium restriction and increased consumption of fresh fruit, vegetables, and low fat dairy, alcohol moderation, and smoking cessation.;Monitor prescription use compliance.    Expected Outcomes  Short Term: Continued assessment and intervention until BP is < 140/967mHG in hypertensive participants. < 130/809mG in hypertensive participants with diabetes, heart failure or chronic kidney disease.;Long Term: Maintenance of blood pressure at goal levels.    Lipids  Yes    Intervention  Provide education and support for participant on nutrition & aerobic/resistive exercise along with prescribed medications to achieve LDL <26m30mDL >40mg43m Expected Outcomes  Short Term: Participant states understanding of desired cholesterol values and is compliant with medications prescribed. Participant is following exercise prescription and nutrition guidelines.;Long Term: Cholesterol controlled with medications as prescribed, with individualized exercise RX and with  personalized nutrition plan. Value goals: LDL < 26mg,86m > 40 mg.       Core Components/Risk Factors/Patient Goals Review:  Goals and Risk Factor Review    Row Name 08/04/19 1122 08/19/19 1356 09/16/19 1111         Core Components/Risk Factors/Patient Goals Review   Personal Goals Review  Weight Management/Obesity;Hypertension;Lipids;Diabetes  Weight Management/Obesity;Hypertension;Lipids;Diabetes  Weight Management/Obesity;Hypertension;Lipids;Diabetes     Review  Mr. Buxton Mangiaracinaultiple CAD risk factors. He is eager to participate in CR for lifestyle modifications. His short-term goal is to improve his shortness of breath and stamina. Long-term he would like to loose  weight (goal 199 lbs).  [name] has multiple CAD risk factors. He is eager to participate in CR for lifestyle modifications. His short-term goal is to improve his shortness of breath and stamina. He is very close to meeting this goal (see ITP comments) Long-term he would like to loose weight (goal 199 lbs).  [name] has multiple CAD risk factors. He is using knowledge gained from the program to modify these risk factors. He has met with the RD several times and feels his diet has significantly improved. His weight remains stable at 100kg however his nutrition has improved. He feels he has met his goal of increasing his stamina and strength.     Expected Outcomes  Patient will continue to participate in CR to improve stamina and strength and modify risk factors  Patient will continue to participate in CR to improve stamina and strength and modify risk factors  Patient will continue to participate in CR to improve stamina and strength and modify risk factors        Core Components/Risk Factors/Patient Goals at Discharge (Final Review):  Goals and Risk Factor Review - 09/16/19 1111      Core Components/Risk Factors/Patient Goals Review   Personal Goals Review  Weight Management/Obesity;Hypertension;Lipids;Diabetes    Review  [name] has  multiple CAD risk factors. He is using knowledge gained from the program to modify these risk factors. He has met with the RD several times and feels his diet has significantly improved. His weight remains stable at 100kg however his nutrition has improved. He feels he has met his goal of increasing his stamina and strength.    Expected Outcomes  Patient will continue to participate in CR to improve stamina and strength and modify risk factors       ITP Comments: ITP Comments    Row Name 07/29/19 0752 08/04/19 1116 08/19/19 1348 09/16/19 1105     ITP Comments  Dr. Traci Turner, Medical Director [facility]  [name] completed his first CR exercise session today and tolerated very well. VSS. Denied cardiac complaints. He worked to an RPE of 9-11 with average mets of 2.0  30 day ITP review: [name] continues to do well in cardiac rehab. VSS without cardiac complaints. He is self motivated to improve his health and requests appropriate workload increases. He consistantly works to an RPE of 11-13. He is on target to achieve his short-term goal of decreasing his shortness of breath and improving his stamina. States he can now walk up the hill in his neighborhood without significant shortness of breath. This has greatly improved his confidence about his recovery and his quality of life. His average mets have increased to 3.0  30 day ITP review: [name] continues to do very well in cardiac rehab. VSS. Denies complaints. He continues to tolerate workload increases and his average mets have increased to 4.3. He states his shortness of breath has significantly improved as well as his stamina. He will soon graduate from CR and feels he is equipt with knowledge to continue his lifestyle modifications to decrease his CAD riskfactors.       Comments: see ITP comments 

## 2019-09-17 ENCOUNTER — Other Ambulatory Visit: Payer: Self-pay

## 2019-09-17 ENCOUNTER — Encounter (HOSPITAL_COMMUNITY)
Admission: RE | Admit: 2019-09-17 | Discharge: 2019-09-17 | Disposition: A | Discharge status: Home / Self Care | Payer: Medicare Other | Source: Ambulatory Visit | Attending: Interventional Cardiology | Admitting: Interventional Cardiology

## 2019-09-17 DIAGNOSIS — Z951 Presence of aortocoronary bypass graft: Secondary | ICD-10-CM | POA: Diagnosis not present

## 2019-09-19 ENCOUNTER — Other Ambulatory Visit: Payer: Self-pay

## 2019-09-19 ENCOUNTER — Encounter (HOSPITAL_COMMUNITY)
Admission: RE | Admit: 2019-09-19 | Discharge: 2019-09-19 | Disposition: A | Discharge status: Home / Self Care | Payer: Medicare Other | Source: Ambulatory Visit | Attending: Interventional Cardiology | Admitting: Interventional Cardiology

## 2019-09-19 DIAGNOSIS — Z951 Presence of aortocoronary bypass graft: Secondary | ICD-10-CM

## 2019-09-22 ENCOUNTER — Other Ambulatory Visit: Payer: Self-pay

## 2019-09-22 ENCOUNTER — Encounter (HOSPITAL_COMMUNITY)
Admission: RE | Admit: 2019-09-22 | Discharge: 2019-09-22 | Disposition: A | Discharge status: Home / Self Care | Payer: Medicare Other | Source: Ambulatory Visit | Attending: Interventional Cardiology | Admitting: Interventional Cardiology

## 2019-09-22 DIAGNOSIS — Z951 Presence of aortocoronary bypass graft: Secondary | ICD-10-CM

## 2019-09-23 ENCOUNTER — Other Ambulatory Visit: Payer: Self-pay | Admitting: Cardiothoracic Surgery

## 2019-09-24 ENCOUNTER — Other Ambulatory Visit: Payer: Self-pay

## 2019-09-24 ENCOUNTER — Encounter (HOSPITAL_COMMUNITY)
Admission: RE | Admit: 2019-09-24 | Discharge: 2019-09-24 | Disposition: A | Discharge status: Home / Self Care | Payer: Medicare Other | Source: Ambulatory Visit | Attending: Interventional Cardiology | Admitting: Interventional Cardiology

## 2019-09-24 DIAGNOSIS — Z951 Presence of aortocoronary bypass graft: Secondary | ICD-10-CM

## 2019-09-24 NOTE — Progress Notes (Signed)
CARDIOLOGY OFFICE NOTE  Date:  09/30/2019    [name] Date of Birth: 27-Nov-1945 Medical Record [id number]  PCP:  Sean Battles, MD  Cardiologist:  [name]   Chief Complaint  Patient presents with  . Follow-up    History of Present Illness: [name] is a 74 y.o. male who presents today for a 2 month check. Former patient of [name] andDr. Tennant's.He basically follows with me.I see his wife as well.  He has chronic atrial fibrillation that has recurred after multiple cardioversions. Hewas previously onCoumadinand then switched over to Eliquis. He had a low risk Myoview inOctoberof 2016.He has had coronary calcifications noted on prior CT scans.He also has OSA on CPAP, hypertension, hyperlipidemia, obesity.Last CT of the chest from 10/2017 - to repeat in 2 years.   I have followed him over the past several years. He had more issues with back pain with associated HTN - we adjusted his medicines. He had his back fixed - BP came down and we were able to titrate down and even stop some of his antihypertensives. We did a telehealth visit back in June - his ACE had been changed to ARB due to cough. Otherwise was doing ok. Stable DOE.  Seen hereback in October- noted a new onset of exertional tightness in the his chest - coronary CT was arranged - this is abnormal with +FFR noted.Referred for cardiac cath - to try and manage medicallywith aggressive CV risk factor modificationbutended up referring on for CABGx 3 along with LA clippingon 05/27/2019 after failing on medical therapy. Post op course was unremarkable. He was placed back on his Eliquis for his AF. He did have a possible thymoma was removed - pathologywas unclear with further disposition pending. On follow up in early March - he was not doing well - was more short of breath - pleuritic chest pain - had had his 2nd vaccine. We ended up sending for CXR, checking for COVID - no real clear cut  etiology but he was greatly improved on follow up later in March. He had seen [name] - no treatment needed - for follow up scan with [facility] in 6 months.  The patient does not have symptoms concerning for COVID-19 infection (fever, chills, cough, or new shortness of breath).   Comes in today. Here with his wife. She had a fall in the yard - broke her big toe and her left wrist. He is having to do more at home. Little shortness of breath. He is working in the yard. Graduated from rehab last week. [facility]. He is off his Lasix - feels like this has caused the shortness of breath. But his swelling typically goes down overnight - he is watching the salt.  Has not had his medicines yet today due to fasting. BP was good at rehab and has been good at home. No chest pain. No problems with his coumadin. Overall, seems to be doing ok. He does need labs today.   Past Medical History:  Diagnosis Date  . A-fib ([place])   . Arrhythmia    hx. Atrial Fib. ,"with regurgitation"  . Depression   . Diabetes mellitus   . Dysrhythmia    a. fib-on Eliquis  . Heart murmur   . Hx of echocardiogram    Echo 3/16:  Mild LVH, EF 60-65%, mild MR, severe LAE, mild RAE, PASP 32 mmHg  . Hypercholesteremia   . Hypertension   .  Macular degeneration of both eyes   . OSA on CPAP   . Osteoarthritis (arthritis due to wear and tear of joints)   . Palpitations   . Prostate cancer ([place])    seed implantion- 15 to 20 yrs ago  . Sleep apnea    uses CPAP-settings 5  . 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 Ureter, stricture    hx. 2 yrs ago-stent has been removed now    Past Surgical History:  Procedure Laterality Date  . CLIPPING OF ATRIAL APPENDAGE N/A 05/27/2019   Procedure: CLIPPING OF ATRIAL APPENDAGE - Using AtriCure Clip size 45MM;  Surgeon: Grace Isaac, MD;  Location: [facility];  Service: Open Heart Surgery;  Laterality: N/A;  . COLONOSCOPY  2007  . CORONARY ARTERY BYPASS GRAFT N/A 05/27/2019    Procedure: CORONARY ARTERY BYPASS GRAFTING (CABG) x Three , using left internal mammary artery and right leg greater saphenous vein harvested endoscopically LIMA to LAD, SVG to DIAG, SVG to RCA. Incision of left internal mammary lymph node Incision of Left Internal  Mediastinal Mass;  Surgeon: Grace Isaac, MD;  Location: [facility];  Service: Open Heart Surgery;  Laterality: N/A;  . CYSTOSCOPY W/ RETROGRADES  04/04/2012   Procedure: CYSTOSCOPY WITH RETROGRADE PYELOGRAM;  Surgeon: Molli Hazard, MD;  Location: [facility];  Service: Urology;  Laterality: Bilateral;  . CYSTOSCOPY WITH RETROGRADE PYELOGRAM, URETEROSCOPY AND STENT PLACEMENT  04/04/2012   Procedure: CYSTOSCOPY WITH RETROGRADE PYELOGRAM, URETEROSCOPY AND STENT PLACEMENT;  Surgeon: Molli Hazard, MD;  Location: [facility];  Service: Urology;  Laterality: Right;  . JOINT REPLACEMENT Left    knee  . LEFT HEART CATH AND CORONARY ANGIOGRAPHY N/A 03/14/2019   Procedure: LEFT HEART CATH AND CORONARY ANGIOGRAPHY;  Surgeon: Belva Crome, MD;  Location: [facility];  Service: Cardiovascular;  Laterality: N/A;  . LUMBAR LAMINECTOMY/DECOMPRESSION MICRODISCECTOMY N/A 09/27/2017   Procedure: LAMINECTOMY LUMBAR THREE- LUMBAR FOUR, LUMBAR FOUR- LUMBAR FIVE;  Surgeon: Ashok Pall, MD;  Location: [facility];  Service: Neurosurgery;  Laterality: N/A;  . ROTATOR CUFF REPAIR Left   . seed implants    . TEE WITHOUT CARDIOVERSION N/A 05/27/2019   Procedure: TRANSESOPHAGEAL ECHOCARDIOGRAM (TEE);  Surgeon: Grace Isaac, MD;  Location: [facility];  Service: Open Heart Surgery;  Laterality: N/A;  . TOTAL KNEE ARTHROPLASTY Left 08/06/2014   Procedure: LEFT TOTAL KNEE ARTHROPLASTY;  Surgeon: Susa Day, MD;  Location: [facility];  Service: Orthopedics;  Laterality: Left;     Medications: Current Meds  Medication Sig  . allopurinol (ZYLOPRIM) 300 MG tablet Take 300 mg by mouth daily.  Marland Kitchen aspirin EC 81 MG EC tablet Take 1 tablet (81 mg total) by mouth  daily.  Marland Kitchen atorvastatin (LIPITOR) 40 MG tablet TAKE 1 TABLET (40 MG TOTAL) BY MOUTH DAILY AT 6 PM.  . ELIQUIS 5 MG TABS tablet TAKE 1 TABLET BY MOUTH TWO  TIMES DAILY  . KLOR-CON M10 10 MEQ tablet Take 10 mEq by mouth daily.  Marland Kitchen losartan (COZAAR) 100 MG tablet Take 100 mg by mouth daily.  . metFORMIN (GLUCOPHAGE) 1000 MG tablet Take 500 mg by mouth daily with breakfast.  . metoprolol tartrate (LOPRESSOR) 25 MG tablet Take 1 tablet (25 mg total) by mouth 2 (two) times daily.  . Multiple Vitamin (MULTIVITAMIN WITH MINERALS) TABS tablet Take 1 tablet by mouth daily. Centrum Silver  . Multiple Vitamins-Minerals (PRESERVISION AREDS 2) CAPS Take 1 capsule by mouth 2 (two) times daily.   . potassium  chloride (KLOR-CON) 10 MEQ tablet Take 1 tablet (10 mEq total) by mouth daily.  . traMADol (ULTRAM) 50 MG tablet Take 1 tablet (50 mg total) by mouth every 6 (six) hours as needed.     Allergies: Allergies  Allergen Reactions  . Lisinopril Cough  . Sulfa Drugs Cross Reactors     CHILDHOOD UNSPECIFIED REACTION   . Contrast Media [Iodinated Diagnostic Agents] Rash  . Doxycycline Itching  . Ioxaglate Rash  . Tetracyclines & Related Rash    Social History: The patient  reports that he quit smoking about 46 years ago. He has never used smokeless tobacco. He reports current alcohol use. He reports that he does not use drugs.   Family History: The patient's family history includes Cancer in his mother; Colon cancer in his mother; Dementia in his mother; Heart attack in his father and sister; Stroke in his sister.   Review of Systems: Please see the history of present illness.   All other systems are reviewed and negative.   Physical Exam: VS:  BP (!) 150/94   Pulse 73   Ht 5\' 11"  (1.803 m)   Wt 222 lb 12.8 oz (101.1 kg)   SpO2 99%   BMI 31.07 kg/m  .  BMI Body mass index is 31.07 kg/m.  Wt Readings from Last 3 Encounters:  09/30/19 222 lb 12.8 oz (101.1 kg)  09/26/19 222 lb 7.1 oz (100.9  kg)  07/29/19 219 lb 5.7 oz (99.5 kg)    General: Pleasant. Alert and in no acute distress.   HEENT: Normal.  Neck: Supple, no JVD, carotid bruits, or masses noted.  Cardiac: Irregular irregular rhythm. Rate is ok. No murmurs, rubs, or gallops. No edema.  Respiratory:  Lungs are clear to auscultation bilaterally with normal work of breathing.  GI: Soft and nontender.  MS: No deformity or atrophy. Gait and ROM intact.  Skin: Warm and dry. Color is normal.  Neuro:  Strength and sensation are intact and no gross focal deficits noted.  Psych: Alert, appropriate and with normal affect.   LABORATORY DATA:  EKG:  EKG is not ordered today.    Lab Results  Component Value Date   WBC 5.9 07/14/2019   HGB 13.4 07/14/2019   HCT 39.5 07/14/2019   PLT 171 07/14/2019   GLUCOSE 128 (H) 07/14/2019   CHOL 137 10/28/2018   TRIG 215 (H) 10/28/2018   HDL 33 (L) 10/28/2018   LDLCALC 61 10/28/2018   ALT 29 05/23/2019   AST 32 05/23/2019   NA 136 07/14/2019   K 3.9 07/14/2019   CL 99 07/14/2019   CREATININE 1.27 07/14/2019   BUN 13 07/14/2019   CO2 23 07/14/2019   INR 1.6 (H) 05/27/2019   HGBA1C 5.5 05/23/2019       BNP (last 3 results) No results for input(s): BNP in the last 8760 hours.  ProBNP (last 3 results) No results for input(s): PROBNP in the last 8760 hours.   Other Studies Reviewed Today:  CABG 05/2019 PROCEDURE PERFORMED:1Coronary artery bypass grafting x3 with the left internal mammary to the left anterior descending coronary artery, reverse saphenous vein graft to the diagonal coronary artery, reverse saphenous vein graft to the distal right  coronary artery with right greater saphenous thigh endoscopic vein harvesting 2. Placement of Atriclip- Left Atrial Clip 45 mm  3. Sampling of left internal mammary lymph node 4. 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   LEFT HEART CATH AND CORONARY ANGIOGRAPHY10/30/2020  Conclusion   Moderately  severe three-vessel coronary disease.  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 2 small obtuse marginal branch is supplied on the left lateral wall.  40% proximal LAD with 60 to 70% diffuse mid to distal LAD. First diagonal contains eccentric 50 to 70% narrowing.  Right coronary is diffusely diseased with mid 60 to 65% stenosis. Distal 75% stenosis before small PDA. 99% stenosis in the continuation of the right coronary before the second PLA. The acute marginal branch of the right coronary has multifocal 95% stenoses.  Low normal LVEF approximately 50% with normal EDP.  RECOMMENDATIONS:   Aggressive risk preventive therapy with LDL target less than 55.  Add long-acting nitrate therapy to improve exertional dyspnea and chest tightness.  If unacceptable symptoms, would consider multivessel coronary bypass grafting. In absence of high-grade proximal CAD, I feel a measured approach attempting to control symptoms with medications is a reasonable first option.     ASSESSMENT & PLAN:   1. CAD - s/p CABG x 3 per EBG - 4 months out - doing well. Continue with CV risk factor modification.   2. Persistent AF - rate is controlled - continue with anticoagulation with Eliquis - he did have clipping of the LA - there is no plan to attempt/restore NSR  3. HLD - on statin - lab today.   4. Chronic anticoagulation - no problems noted - lab today.   5. AB thymoma - no plan for radiation - for repeat scan per [facility].   6. HTN - had good control at rehab - higher here today since he has not had his medicines. Will follow.   7. Lung nodule - for repeat scan later this year.   8. Dyspnea - vague - will check BNP - would hold on adding back lasix - no overt heart failure noted.   9. COVID-19 Education: The signs and symptoms of COVID-19 were discussed with the patient and how to seek care for testing (follow up with PCP or arrange  E-visit).  The importance of social distancing, staying at home, hand hygiene and wearing a mask when out in public were discussed today.  Current medicines are reviewed with the patient today.  The patient does not have concerns regarding medicines other than what has been noted above.  The following changes have been made:  See above.  Labs/ tests ordered today include:    Orders Placed This Encounter  Procedures  . Basic metabolic panel  . CBC  . Hepatic function panel  . Lipid panel  . Pro b natriuretic peptide (BNP)     Disposition:   FU with me in about 4 months. Labs today. No change with current plan of care.     Patient is agreeable to this plan and will call if any problems develop in the interim.   SignedTruitt Merle, NP  09/30/2019 10:42 AM  [facility] [address] [place], [place]  [address] Phone: [phone] Fax: [phone]

## 2019-09-26 ENCOUNTER — Other Ambulatory Visit: Payer: Self-pay

## 2019-09-26 ENCOUNTER — Encounter (HOSPITAL_COMMUNITY)
Admission: RE | Admit: 2019-09-26 | Discharge: 2019-09-26 | Disposition: A | Discharge status: Home / Self Care | Payer: Medicare Other | Source: Ambulatory Visit | Attending: Interventional Cardiology | Admitting: Interventional Cardiology

## 2019-09-26 VITALS — BP 144/80 | HR 69 | Temp 97.2°F | Ht 71.0 in | Wt 222.4 lb

## 2019-09-26 DIAGNOSIS — Z951 Presence of aortocoronary bypass graft: Secondary | ICD-10-CM

## 2019-09-26 NOTE — Progress Notes (Signed)
Discharge Progress Report  Patient Details  Name: [name] MRN: [id number] Date of Birth: 21-Aug-1945 Referring Provider:     CARDIAC REHAB PHASE II ORIENTATION from 07/29/2019 in [place]  Referring Provider  Lanelle Bal, MD       Number of Visits: 23  Reason for Discharge:  Patient reached a stable level of exercise. Patient independent in their exercise. Patient has met program and personal goals.  Smoking History:  Social History   Tobacco Use  Smoking Status Former Smoker  . Quit date: 05/15/1973  . Years since quitting: 46.4  Smokeless Tobacco Never Used    Diagnosis:  S/P CABG x 3  ADL UCSD:   Initial Exercise Prescription: Initial Exercise Prescription - 07/29/19 1100      Date of Initial Exercise RX and Referring Provider   Date  07/29/19    Referring Provider  Lanelle Bal, MD    Expected Discharge Date  09/26/19      Bike   Level  2.5    Minutes  15    METs  2.7      NuStep   Level  3    SPM  85    Minutes  15    METs  2.7      Prescription Details   Frequency (times per week)  3    Duration  Progress to 30 minutes of continuous aerobic without signs/symptoms of physical distress      Intensity   THRR 40-80% of Max Heartrate  58-116    Ratings of Perceived Exertion  11-13    Perceived Dyspnea  0-4      Progression   Progression  Continue to progress workloads to maintain intensity without signs/symptoms of physical distress.      Resistance Training   Training Prescription  Yes    Weight  3lbs.    Reps  10-15       Discharge Exercise Prescription (Final Exercise Prescription Changes): Exercise Prescription Changes - 09/26/19 0702      Response to Exercise   Blood Pressure (Admit)  144/80    Blood Pressure (Exercise)  142/82    Blood Pressure (Exit)  142/82    Heart Rate (Admit)  69 bpm    Heart Rate (Exercise)  100 bpm    Heart Rate (Exit)  69 bpm    Rating of Perceived Exertion  (Exercise)  13    Symptoms  None    Duration  Continue with 30 min of aerobic exercise without signs/symptoms of physical distress.    Intensity  THRR unchanged      Progression   Progression  Continue to progress workloads to maintain intensity without signs/symptoms of physical distress.    Average METs  3.3      Resistance Training   Training Prescription  Yes    Weight  5lbs    Reps  10-15    Time  10 Minutes      Interval Training   Interval Training  No      Bike   Level  4.2    Minutes  15    METs  4.2      NuStep   Level  5    SPM  85    Minutes  15    METs  2.4      Home Exercise Plan   Plans to continue exercise at  Home (comment)   Walking   Frequency  Add 3  additional days to program exercise sessions.    Initial Home Exercises Provided  08/18/19       Functional Capacity: 6 Minute Walk    Row Name 07/29/19 0801 09/19/19 0713       6 Minute Walk   Phase  Initial  Discharge    Distance  1485 feet  1541 feet    Distance % Change  --  3.77 %    Distance Feet Change  --  56 ft    Walk Time  6 minutes  6 minutes    # of Rest Breaks  0  0    MPH  2.81  2.92    METS  2.72  2.8    RPE  12  13    Perceived Dyspnea   0  0    VO2 Peak  9.52  9.81    Symptoms  No  No    Resting HR  63 bpm  70 bpm    Resting BP  108/60  112/68    Resting Oxygen Saturation   97 %  --    Exercise Oxygen Saturation  during 6 min walk  99 %  --    Max Ex. HR  97 bpm  91 bpm    Max Ex. BP  122/82  132/82    2 Minute Post BP  104/76  124/82       Psychological, QOL, Others - Outcomes: PHQ 2/9: Depression screen [facility] 2/9 09/26/2019 07/29/2019 07/29/2019  Decreased Interest 0 - 0  Down, Depressed, Hopeless 0 (No Data) 1  PHQ - 2 Score 0 - 1    Quality of Life: Quality of Life - 09/19/19 0912      Quality of Life   Select  Quality of Life      Quality of Life Scores   Health/Function Pre  19.47 %    Health/Function Post  19.8 %    Health/Function % Change  1.69 %     Socioeconomic Pre  23.5 %    Socioeconomic Post  25.5 %    Socioeconomic % Change   8.51 %    Psych/Spiritual Pre  22.93 %    Psych/Spiritual Post  25.29 %    Psych/Spiritual % Change  10.29 %    Family Pre  20.75 %    Family Post  19.38 %    Family % Change  -6.6 %    GLOBAL Pre  21.14 %    GLOBAL Post  21.9 %    GLOBAL % Change  3.6 %       Personal Goals: Goals established at orientation with interventions provided to work toward goal. Personal Goals and Risk Factors at Admission - 07/29/19 1308      Core Components/Risk Factors/Patient Goals on Admission    Weight Management  Yes;Weight Loss    Intervention  Weight Management: Provide education and appropriate resources to help participant work on and attain dietary goals.;Weight Management: Develop a combined nutrition and exercise program designed to reach desired caloric intake, while maintaining appropriate intake of nutrient and fiber, sodium and fats, and appropriate energy expenditure required for the weight goal.    Admit Weight  219 lb 5.7 oz (99.5 kg)    Goal Weight: Short Term  213 lb (96.6 kg)    Goal Weight: Long Term  199 lb (90.3 kg)    Expected Outcomes  Short Term: Continue to assess and modify interventions until short term weight is achieved;Long  Term: Adherence to nutrition and physical activity/exercise program aimed toward attainment of established weight goal;Weight Loss: Understanding of general recommendations for a balanced deficit meal plan, which promotes 1-2 lb weight loss per week and includes a negative energy balance of [phone] kcal/d;Understanding recommendations for meals to include 15-35% energy as protein, 25-35% energy from fat, 35-60% energy from carbohydrates, less than 247m of dietary cholesterol, 20-35 gm of total fiber daily;Understanding of distribution of calorie intake throughout the day with the consumption of 4-5 meals/snacks    Diabetes  Yes    Intervention  Provide education about  signs/symptoms and action to take for hypo/hyperglycemia.;Provide education about proper nutrition, including hydration, and aerobic/resistive exercise prescription along with prescribed medications to achieve blood glucose in normal ranges: Fasting glucose 65-99 mg/dL    Expected Outcomes  Short Term: Participant verbalizes understanding of the signs/symptoms and immediate care of hyper/hypoglycemia, proper foot care and importance of medication, aerobic/resistive exercise and nutrition plan for blood glucose control.;Long Term: Attainment of HbA1C < 7%.    Hypertension  Yes    Intervention  Provide education on lifestyle modifcations including regular physical activity/exercise, weight management, moderate sodium restriction and increased consumption of fresh fruit, vegetables, and low fat dairy, alcohol moderation, and smoking cessation.;Monitor prescription use compliance.    Expected Outcomes  Short Term: Continued assessment and intervention until BP is < 140/953mHG in hypertensive participants. < 130/8056mG in hypertensive participants with diabetes, heart failure or chronic kidney disease.;Long Term: Maintenance of blood pressure at goal levels.    Lipids  Yes    Intervention  Provide education and support for participant on nutrition & aerobic/resistive exercise along with prescribed medications to achieve LDL <18m87mDL >40mg91m Expected Outcomes  Short Term: Participant states understanding of desired cholesterol values and is compliant with medications prescribed. Participant is following exercise prescription and nutrition guidelines.;Long Term: Cholesterol controlled with medications as prescribed, with individualized exercise RX and with personalized nutrition plan. Value goals: LDL < 18mg,48m > 40 mg.        Personal Goals Discharge: Goals and Risk Factor Review    Row Name 08/04/19 1122 08/19/19 1356 09/16/19 1111         Core Components/Risk Factors/Patient Goals Review    Personal Goals Review  Weight Management/Obesity;Hypertension;Lipids;Diabetes  Weight Management/Obesity;Hypertension;Lipids;Diabetes  Weight Management/Obesity;Hypertension;Lipids;Diabetes     Review  Mr. Sean Santoneultiple CAD risk factors. He is eager to participate in CR for lifestyle modifications. His short-term goal is to improve his shortness of breath and stamina. Long-term he would like to loose weight (goal 199 lbs).  Mr. Sean Leinbergerultiple CAD risk factors. He is eager to participate in CR for lifestyle modifications. His short-term goal is to improve his shortness of breath and stamina. He is very close to meeting this goal (see ITP comments) Long-term he would like to loose weight (goal 199 lbs).  Sean hAydenultiple CAD risk factors. He is using knowledge gained from the program to modify these risk factors. He has met with the RD several times and feels his diet has significantly improved. His weight remains stable at 100kg however his nutrition has improved. He feels he has met his goal of increasing his stamina and strength.     Expected Outcomes  Patient will continue to participate in CR to improve stamina and strength and modify risk factors  Patient will continue to participate in CR to improve stamina and strength and modify risk factors  Patient will continue  to participate in CR to improve stamina and strength and modify risk factors        Exercise Goals and Review: Exercise Goals    Row Name 07/29/19 0753             Exercise Goals   Increase Physical Activity  Yes       Intervention  Provide advice, education, support and counseling about physical activity/exercise needs.;Develop an individualized exercise prescription for aerobic and resistive training based on initial evaluation findings, risk stratification, comorbidities and participant's personal goals.       Expected Outcomes  Short Term: Attend rehab on a regular basis to increase amount of physical activity.;Long  Term: Exercising regularly at least 3-5 days a week.;Long Term: Add in home exercise to make exercise part of routine and to increase amount of physical activity.       Increase Strength and Stamina  Yes       Intervention  Provide advice, education, support and counseling about physical activity/exercise needs.;Develop an individualized exercise prescription for aerobic and resistive training based on initial evaluation findings, risk stratification, comorbidities and participant's personal goals.       Expected Outcomes  Short Term: Increase workloads from initial exercise prescription for resistance, speed, and METs.;Short Term: Perform resistance training exercises routinely during rehab and add in resistance training at home;Long Term: Improve cardiorespiratory fitness, muscular endurance and strength as measured by increased METs and functional capacity (6MWT)       Able to understand and use rate of perceived exertion (RPE) scale  Yes       Intervention  Provide education and explanation on how to use RPE scale       Expected Outcomes  Short Term: Able to use RPE daily in rehab to express subjective intensity level;Long Term:  Able to use RPE to guide intensity level when exercising independently       Knowledge and understanding of Target Heart Rate Range (THRR)  Yes       Intervention  Provide education and explanation of THRR including how the numbers were predicted and where they are located for reference       Expected Outcomes  Short Term: Able to state/look up THRR;Long Term: Able to use THRR to govern intensity when exercising independently;Short Term: Able to use daily as guideline for intensity in rehab       Able to check pulse independently  Yes       Intervention  Provide education and demonstration on how to check pulse in carotid and radial arteries.;Review the importance of being able to check your own pulse for safety during independent exercise       Expected Outcomes  Short  Term: Able to explain why pulse checking is important during independent exercise;Long Term: Able to check pulse independently and accurately       Understanding of Exercise Prescription  Yes       Intervention  Provide education, explanation, and written materials on patient's individual exercise prescription       Expected Outcomes  Short Term: Able to explain program exercise prescription;Long Term: Able to explain home exercise prescription to exercise independently          Exercise Goals Re-Evaluation: Exercise Goals Re-Evaluation    Row Name 08/04/19 0820 08/18/19 0732 09/15/19 0720 09/26/19 0751       Exercise Goal Re-Evaluation   Exercise Goals Review  Increase Physical Activity;Able to understand and use rate of perceived exertion (RPE) scale  Increase Physical Activity;Able to understand and use rate of perceived exertion (RPE) scale;Understanding of Exercise Prescription;Knowledge and understanding of Target Heart Rate Range (THRR);Able to check pulse independently;Increase Strength and Stamina  Increase Physical Activity;Able to understand and use rate of perceived exertion (RPE) scale;Understanding of Exercise Prescription;Knowledge and understanding of Target Heart Rate Range (THRR);Able to check pulse independently;Increase Strength and Stamina  Increase Physical Activity;Able to understand and use rate of perceived exertion (RPE) scale;Understanding of Exercise Prescription;Knowledge and understanding of Target Heart Rate Range (THRR);Able to check pulse independently;Increase Strength and Stamina    Comments  Patient able to understand and use RPE scale appropriately. Patient has a pool at home and plans to walk laps in the pool as one mode of exercise once the weather get warmer.  Reviewed home exercise guidelines with patient including THRR, RPE scale, temperature precautions, and endpoints for exercise. Instructed patient on pulse counting. Patient is walking 30 minutes on Tuesdays  and Thursdays and tries to walk during the weekend as well, as his mode of home exercise. Patient states that he is now able to walk up a hill on his path twice during his walk, which was a goal for him. Patient would also like to lose 5 lbs. Increased workload on the bike today and increased from 3lb. to 4lb. hand weights today.  Patient is stll walking 30 minutes but states it's sporadic. Patient states that he feel he can breath better now, which was his goal and he's made friends during his time in cardiac rehab.  Patient completed the cardiac rehab program and will continue walking and exercise at local gym. Patient plans to exercise at least 30 minutes, at least 3 days/week. Patient's functional capacity increased 4% as measured by 6MWT and strength increased 3% as measured by grip strength test.    Expected Outcomes  Progress workloads to help patient achieve personal health and fitness goals.  Continue to increase workloads as tolerated to help achieve personal health and fitness goals.  Patient will work on walking 30 minutes consistently at home in addition to exercise at cardiac rehab.  Patient will walk at home or exercise at the gym 30 minutes, at least 3 days/week to maintain health and fitness gains.       Nutrition & Weight - Outcomes: Pre Biometrics - 07/29/19 0752      Pre Biometrics   Waist Circumference  41.5 inches    Hip Circumference  46.5 inches    Waist to Hip Ratio  0.89 %    Triceps Skinfold  12 mm    % Body Fat  28.3 %    Grip Strength  45 kg    Flexibility  0 in    Single Leg Stand  7.68 seconds      Post Biometrics - 09/26/19 0702       Post  Biometrics   Height  5' 11"  (1.803 m)    Weight  100.9 kg    Waist Circumference  41.5 inches    Hip Circumference  45 inches    Waist to Hip Ratio  0.92 %    BMI (Calculated)  31.04    Triceps Skinfold  15 mm    % Body Fat  29.3 %    Grip Strength  46.5 kg    Flexibility  0 in    Single Leg Stand  7.81 seconds        Nutrition: Nutrition Therapy & Goals - 08/04/19 1027  Nutrition Therapy   Diet  Heart Healthy      Personal Nutrition Goals   Nutrition Goal  Pt to identify food quantities necessary to achieve weight loss of 6-24 lb at graduation from cardiac rehab.    Personal Goal #2  Pt to build a healthy plate including vegetables, fruits, whole grains, and low-fat dairy products in a heart healthy meal plan.    Personal Goal #3  Pt able to name foods that affect blood glucose      Intervention Plan   Intervention  Prescribe, educate and counsel regarding individualized specific dietary modifications aiming towards targeted core components such as weight, hypertension, lipid management, diabetes, heart failure and other comorbidities.;Nutrition handout(s) given to patient.    Expected Outcomes  Short Term Goal: A plan has been developed with personal nutrition goals set during dietitian appointment.;Long Term Goal: Adherence to prescribed nutrition plan.       Nutrition Discharge: Nutrition Assessments - 09/18/19 0819      MEDFICTS Scores   Post Score  18       Education Questionnaire Score: Knowledge Questionnaire Score - 09/17/19 1020      Knowledge Questionnaire Score   Pre Score  16/24    Post Score  22/24       Goals reviewed with patient; copy given to patient.

## 2019-09-30 ENCOUNTER — Ambulatory Visit: Payer: Medicare Other | Admitting: Nurse Practitioner

## 2019-09-30 ENCOUNTER — Encounter: Payer: Self-pay | Admitting: Nurse Practitioner

## 2019-09-30 ENCOUNTER — Other Ambulatory Visit: Payer: Self-pay

## 2019-09-30 VITALS — BP 150/94 | HR 73 | Ht 71.0 in | Wt 222.8 lb

## 2019-09-30 DIAGNOSIS — I10 Essential (primary) hypertension: Secondary | ICD-10-CM | POA: Diagnosis not present

## 2019-09-30 DIAGNOSIS — E785 Hyperlipidemia, unspecified: Secondary | ICD-10-CM | POA: Diagnosis not present

## 2019-09-30 DIAGNOSIS — R0600 Dyspnea, unspecified: Secondary | ICD-10-CM | POA: Diagnosis not present

## 2019-09-30 DIAGNOSIS — R0609 Other forms of dyspnea: Secondary | ICD-10-CM

## 2019-09-30 DIAGNOSIS — Z951 Presence of aortocoronary bypass graft: Secondary | ICD-10-CM

## 2019-09-30 NOTE — Patient Instructions (Addendum)
After Visit Summary:  We will be checking the following labs today - BMET, CBC, Lipids and HPF   Medication Instructions:    Continue with your current medicines.    If you need a refill on your cardiac medications before your next appointment, please call your pharmacy.     Testing/Procedures To Be Arranged:  N/A  Follow-Up:   See me in 4 months    At [facility], you and your health needs are our priority.  As part of our continuing mission to provide you with exceptional heart care, we have created designated Provider Care Teams.  These Care Teams include your primary Cardiologist (physician) and Advanced Practice Providers (APPs -  Physician Assistants and Nurse Practitioners) who all work together to provide you with the care you need, when you need it.  Special Instructions:  . Stay safe, stay home, wash your hands for at least 20 seconds and wear a mask when out in public.  . It was good to talk with you today.    Call the [place] office at [phone] if you have any questions, problems or concerns.

## 2019-10-01 ENCOUNTER — Other Ambulatory Visit: Payer: Self-pay | Admitting: *Deleted

## 2019-10-01 DIAGNOSIS — Z951 Presence of aortocoronary bypass graft: Secondary | ICD-10-CM

## 2019-10-01 LAB — BASIC METABOLIC PANEL
BUN/Creatinine Ratio: 15 (ref 10–24)
BUN: 18 mg/dL (ref 8–27)
CO2: 24 mmol/L (ref 20–29)
Calcium: 9.3 mg/dL (ref 8.6–10.2)
Chloride: 103 mmol/L (ref 96–106)
Creatinine, Ser: 1.18 mg/dL (ref 0.76–1.27)
GFR calc Af Amer: 69 mL/min/{1.73_m2} (ref >59)
GFR calc non Af Amer: 60 mL/min/{1.73_m2} (ref >59)
Glucose: 107 mg/dL — ABNORMAL HIGH (ref 65–99)
Potassium: 4.7 mmol/L (ref 3.5–5.2)
Sodium: 140 mmol/L (ref 134–144)

## 2019-10-01 LAB — HEPATIC FUNCTION PANEL
ALT: 42 IU/L (ref 0–44)
AST: 34 IU/L (ref 0–40)
Albumin: 4.5 g/dL (ref 3.7–4.7)
Alkaline Phosphatase: 120 IU/L (ref 48–121)
Bilirubin Total: 1.4 mg/dL — ABNORMAL HIGH (ref 0.0–1.2)
Bilirubin, Direct: 0.49 mg/dL — ABNORMAL HIGH (ref 0.00–0.40)
Total Protein: 6.6 g/dL (ref 6.0–8.5)

## 2019-10-01 LAB — CBC
Hematocrit: 42.5 % (ref 37.5–51.0)
Hemoglobin: 13.8 g/dL (ref 13.0–17.7)
MCH: 29 pg (ref 26.6–33.0)
MCHC: 32.5 g/dL (ref 31.5–35.7)
MCV: 89 fL (ref 79–97)
Platelets: 127 10*3/uL — ABNORMAL LOW (ref 150–450)
RBC: 4.76 x10E6/uL (ref 4.14–5.80)
RDW: 14.3 % (ref 11.6–15.4)
WBC: 5.7 10*3/uL (ref 3.4–10.8)

## 2019-10-01 LAB — LIPID PANEL
Chol/HDL Ratio: 2.9 ratio (ref 0.0–5.0)
Cholesterol, Total: 120 mg/dL (ref 100–199)
HDL: 41 mg/dL (ref >39)
LDL Chol Calc (NIH): 61 mg/dL (ref 0–99)
Triglycerides: 91 mg/dL (ref 0–149)
VLDL Cholesterol Cal: 18 mg/dL (ref 5–40)

## 2019-10-01 LAB — PRO B NATRIURETIC PEPTIDE: NT-Pro BNP: 1513 pg/mL — ABNORMAL HIGH (ref 0–486)

## 2019-10-01 MED ORDER — FUROSEMIDE 20 MG PO TABS
20.0000 mg | ORAL_TABLET | Freq: Every day | ORAL | 3 refills | Status: DC
Start: 2019-10-01 — End: 2019-12-04

## 2019-10-14 ENCOUNTER — Other Ambulatory Visit: Payer: Self-pay

## 2019-10-14 ENCOUNTER — Other Ambulatory Visit: Payer: Medicare Other | Admitting: *Deleted

## 2019-10-14 DIAGNOSIS — Z951 Presence of aortocoronary bypass graft: Secondary | ICD-10-CM

## 2019-10-14 LAB — BASIC METABOLIC PANEL
BUN/Creatinine Ratio: 18 (ref 10–24)
BUN: 22 mg/dL (ref 8–27)
CO2: 22 mmol/L (ref 20–29)
Calcium: 9.5 mg/dL (ref 8.6–10.2)
Chloride: 104 mmol/L (ref 96–106)
Creatinine, Ser: 1.21 mg/dL (ref 0.76–1.27)
GFR calc Af Amer: 67 mL/min/{1.73_m2} (ref >59)
GFR calc non Af Amer: 58 mL/min/{1.73_m2} — ABNORMAL LOW (ref >59)
Glucose: 164 mg/dL — ABNORMAL HIGH (ref 65–99)
Potassium: 4.4 mmol/L (ref 3.5–5.2)
Sodium: 141 mmol/L (ref 134–144)

## 2019-10-15 ENCOUNTER — Other Ambulatory Visit: Payer: Self-pay | Admitting: Nurse Practitioner

## 2019-10-15 ENCOUNTER — Other Ambulatory Visit: Payer: Medicare Other

## 2019-10-15 ENCOUNTER — Other Ambulatory Visit: Payer: Self-pay | Admitting: Physician Assistant

## 2019-10-23 ENCOUNTER — Other Ambulatory Visit: Payer: Self-pay | Admitting: Cardiothoracic Surgery

## 2019-10-23 ENCOUNTER — Other Ambulatory Visit: Payer: Self-pay | Admitting: Nurse Practitioner

## 2019-11-03 ENCOUNTER — Other Ambulatory Visit: Payer: Self-pay | Admitting: Nurse Practitioner

## 2019-11-04 NOTE — Telephone Encounter (Signed)
Eliquis 5mg  refill request received. Patient is 74 years old, weight-101.1kg, Crea-1.21 on 10/14/2019, Diagnosis-Afib, and last seen by Truitt Merle on 09/30/2019. Dose is appropriate based on dosing criteria. Will send in refill to requested pharmacy.

## 2019-12-04 ENCOUNTER — Other Ambulatory Visit: Payer: Self-pay

## 2019-12-04 MED ORDER — FUROSEMIDE 20 MG PO TABS: 20.0000 mg | ORAL_TABLET | Freq: Every day | ORAL | 2 refills | Status: DC

## 2019-12-04 MED ORDER — ATORVASTATIN CALCIUM 40 MG PO TABS: 40.0000 mg | ORAL_TABLET | Freq: Every day | ORAL | 2 refills | Status: DC

## 2019-12-04 MED ORDER — METOPROLOL TARTRATE 25 MG PO TABS: 25.0000 mg | ORAL_TABLET | Freq: Two times a day (BID) | ORAL | 2 refills | Status: DC

## 2020-01-06 ENCOUNTER — Other Ambulatory Visit: Payer: Self-pay | Admitting: Cardiothoracic Surgery

## 2020-01-06 DIAGNOSIS — C37 Malignant neoplasm of thymus: Secondary | ICD-10-CM

## 2020-01-26 NOTE — Progress Notes (Signed)
CARDIOLOGY OFFICE NOTE  Date:  02/04/2020    [name] Date of Birth: [birth year] Medical Record #[id number]  PCP:  Leanna Battles, MD  Cardiologist:  Servando Snare   Chief Complaint  Patient presents with  . Follow-up    History of Present Illness: [name] is a 74 y.o. male who presents today for a follow up visit.  Former patient of Dr. Claris Gladden andDr. Tennant's.He basically follows with me.I see his wife as well.  He has chronic atrial fibrillation that has recurred after multiple cardioversions. Hewas previously onCoumadinand then switched over to Eliquis. He had a low risk Myoview inOctoberof 2016.He has had coronary calcifications noted on prior CT scans.He also has OSA on CPAP, hypertension, hyperlipidemia, obesity.Last CT of the chest from 10/2017 - to repeat in 2 years.   I have followed him over the past several years. He had more issues with back pain with associated HTN - we adjusted his medicines. He had his back fixed - BP came down and we were able to titrate down and even stop some of his antihypertensives. We did a telehealth visit back in June - his ACE had been changed to ARB due to cough. Otherwise was doing ok. Stable DOE.  Seen hereback in October- noted a new onset of exertional tightness in the his chest - coronary CT was arranged - this is abnormal with +FFR noted.Referred for cardiac cath - to try and manage medicallywith aggressive CV risk factor modificationbutended up referring on for CABGx 3 along with LA clippingon 05/27/2019 after failing on medical therapy. Post op course was unremarkable. He was placed back on his Eliquis for his AF. He did have a possible thymoma was removed - pathologywas unclear with further disposition pending. On follow up in early March - he was not doing well - was more short of breath - pleuritic chest pain - had had his 2nd vaccine. We ended up sending for CXR, checking for COVID - no real clear cut  etiology but he was greatly improved on follow up later in March. He had seen Dr. Sondra Come - no treatment needed - for follow up scan with EBG in 6 months.  Last seen in May - he was doing well. Had finished rehab. Some swelling which typically went down overnight.   Comes in today. Here with his wife [name]. They are both doing ok. He is back to all his activities. Feels good. No chest pain. Not short of breath. Notes lots of urinary frequency - he continues on Lasix daily. He thinks he may be taking this twice a day. Should just be once a day. Not really checking BP at home. No palpitations. No bleeding. They both feel he is doing well.    Past Medical History:  Diagnosis Date  . A-fib ([place])   . Arrhythmia    hx. Atrial Fib. ,"with regurgitation"  . Depression   . Diabetes mellitus   . Dysrhythmia    a. fib-on Eliquis  . Heart murmur   . Hx of echocardiogram    Echo 3/16:  Mild LVH, EF 60-65%, mild MR, severe LAE, mild RAE, PASP 32 mmHg  . Hypercholesteremia   . Hypertension   . Macular degeneration of both eyes   . OSA on CPAP   . Osteoarthritis (arthritis due to wear and tear of joints)   . Palpitations   . Prostate cancer ([place])    seed implantion- 15 to 20 yrs ago  . Sleep  apnea    uses CPAP-settings 5  . 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 Ureter, stricture    hx. 2 yrs ago-stent has been removed now    Past Surgical History:  Procedure Laterality Date  . CLIPPING OF ATRIAL APPENDAGE N/A 05/27/2019   Procedure: CLIPPING OF ATRIAL APPENDAGE - Using AtriCure Clip size 45MM;  Surgeon: Grace Isaac, MD;  Location: [facility];  Service: Open Heart Surgery;  Laterality: N/A;  . COLONOSCOPY  2007  . CORONARY ARTERY BYPASS GRAFT N/A 05/27/2019   Procedure: CORONARY ARTERY BYPASS GRAFTING (CABG) x Three , using left internal mammary artery and right leg greater saphenous vein harvested endoscopically LIMA to LAD, SVG to DIAG, SVG to RCA. Incision of left internal mammary lymph node  Incision of Left Internal  Mediastinal Mass;  Surgeon: Grace Isaac, MD;  Location: [facility];  Service: Open Heart Surgery;  Laterality: N/A;  . CYSTOSCOPY W/ RETROGRADES  04/04/2012   Procedure: CYSTOSCOPY WITH RETROGRADE PYELOGRAM;  Surgeon: Molli Hazard, MD;  Location: [facility];  Service: Urology;  Laterality: Bilateral;  . CYSTOSCOPY WITH RETROGRADE PYELOGRAM, URETEROSCOPY AND STENT PLACEMENT  04/04/2012   Procedure: CYSTOSCOPY WITH RETROGRADE PYELOGRAM, URETEROSCOPY AND STENT PLACEMENT;  Surgeon: Molli Hazard, MD;  Location: [facility];  Service: Urology;  Laterality: Right;  . JOINT REPLACEMENT Left    knee  . LEFT HEART CATH AND CORONARY ANGIOGRAPHY N/A 03/14/2019   Procedure: LEFT HEART CATH AND CORONARY ANGIOGRAPHY;  Surgeon: Belva Crome, MD;  Location: [facility];  Service: Cardiovascular;  Laterality: N/A;  . LUMBAR LAMINECTOMY/DECOMPRESSION MICRODISCECTOMY N/A 09/27/2017   Procedure: LAMINECTOMY LUMBAR THREE- LUMBAR FOUR, LUMBAR FOUR- LUMBAR FIVE;  Surgeon: Ashok Pall, MD;  Location: [facility];  Service: Neurosurgery;  Laterality: N/A;  . ROTATOR CUFF REPAIR Left   . seed implants    . TEE WITHOUT CARDIOVERSION N/A 05/27/2019   Procedure: TRANSESOPHAGEAL ECHOCARDIOGRAM (TEE);  Surgeon: Grace Isaac, MD;  Location: [facility];  Service: Open Heart Surgery;  Laterality: N/A;  . TOTAL KNEE ARTHROPLASTY Left 08/06/2014   Procedure: LEFT TOTAL KNEE ARTHROPLASTY;  Surgeon: Susa Day, MD;  Location: [facility];  Service: Orthopedics;  Laterality: Left;     Medications: Current Meds  Medication Sig  . allopurinol (ZYLOPRIM) 300 MG tablet Take 300 mg by mouth daily.  Marland Kitchen aspirin EC 81 MG EC tablet Take 1 tablet (81 mg total) by mouth daily.  Marland Kitchen atorvastatin (LIPITOR) 40 MG tablet Take 1 tablet (40 mg total) by mouth daily at 6 PM.  . ELIQUIS 5 MG TABS tablet TAKE 1 TABLET BY MOUTH  TWICE DAILY  . furosemide (LASIX) 20 MG tablet Take 1 tablet (20 mg total) by mouth  daily as needed for fluid or edema.  Marland Kitchen losartan (COZAAR) 100 MG tablet Take 100 mg by mouth daily.  . metFORMIN (GLUCOPHAGE) 1000 MG tablet Take 500 mg by mouth daily with breakfast.  . metoprolol tartrate (LOPRESSOR) 25 MG tablet Take 1 tablet (25 mg total) by mouth 2 (two) times daily.  . Multiple Vitamin (MULTIVITAMIN WITH MINERALS) TABS tablet Take 1 tablet by mouth daily. Centrum Silver  . Multiple Vitamins-Minerals (PRESERVISION AREDS 2) CAPS Take 1 capsule by mouth 2 (two) times daily.   . [DISCONTINUED] furosemide (LASIX) 20 MG tablet Take 1 tablet (20 mg total) by mouth daily.     Allergies: Allergies  Allergen Reactions  . Lisinopril Cough  . Sulfa Drugs Cross Reactors     CHILDHOOD  UNSPECIFIED REACTION   . Contrast Media [Iodinated Diagnostic Agents] Rash  . Doxycycline Itching  . Ioxaglate Rash  . Tetracyclines & Related Rash    Social History: The patient  reports that he quit smoking about 46 years ago. He has never used smokeless tobacco. He reports current alcohol use. He reports that he does not use drugs.   Family History: The patient's family history includes Cancer in his mother; Colon cancer in his mother; Dementia in his mother; Heart attack in his father and sister; Stroke in his sister.   Review of Systems: Please see the history of present illness.   All other systems are reviewed and negative.   Physical Exam: VS:  BP 140/80   Pulse (!) 55   Ht 6' (1.829 m)   Wt 218 lb 3.2 oz (99 kg)   SpO2 96%   BMI 29.59 kg/m  .  BMI Body mass index is 29.59 kg/m.  Wt Readings from Last 3 Encounters:  02/04/20 218 lb 3.2 oz (99 kg)  09/30/19 222 lb 12.8 oz (101.1 kg)  09/26/19 222 lb 7.1 oz (100.9 kg)    General: Pleasant. Alert and in no acute distress.   HEENT: Normal.  Neck: Supple, no JVD, carotid bruits, or masses noted.  Cardiac: Irregular irregular rhythm. Rate is ok. No edema.  Respiratory:  Lungs are clear to auscultation bilaterally with normal  work of breathing.  GI: Soft and nontender.  MS: No deformity or atrophy. Gait and ROM intact.  Skin: Warm and dry. Color is normal.  Neuro:  Strength and sensation are intact and no gross focal deficits noted.  Psych: Alert, appropriate and with normal affect.   LABORATORY DATA:  EKG:  EKG is not ordered today.    Lab Results  Component Value Date   WBC 5.7 09/30/2019   HGB 13.8 09/30/2019   HCT 42.5 09/30/2019   PLT 127 (L) 09/30/2019   GLUCOSE 164 (H) 10/14/2019   CHOL 120 09/30/2019   TRIG 91 09/30/2019   HDL 41 09/30/2019   LDLCALC 61 09/30/2019   ALT 42 09/30/2019   AST 34 09/30/2019   NA 141 10/14/2019   K 4.4 10/14/2019   CL 104 10/14/2019   CREATININE 1.21 10/14/2019   BUN 22 10/14/2019   CO2 22 10/14/2019   INR 1.6 (H) 05/27/2019   HGBA1C 5.5 05/23/2019     BNP (last 3 results) No results for input(s): BNP in the last 8760 hours.  ProBNP (last 3 results) Recent Labs    09/30/19 1107  PROBNP 1,513*     Other Studies Reviewed Today:  CABG 05/2019 PROCEDURE PERFORMED:1Coronary artery bypass grafting x3 with the left internal mammary to the left anterior descending coronary artery, reverse saphenous vein graft to the diagonal coronary artery, reverse saphenous vein graft to the distal right  coronary artery with right greater saphenous thigh endoscopic vein harvesting 2. Placement of Atriclip- Left Atrial Clip 45 mm  3. Sampling of left internal mammary lymph node 4. 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   LEFT HEART CATH AND CORONARY ANGIOGRAPHY10/30/2020  Conclusion   Moderately severe three-vessel coronary disease.  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 2 small obtuse marginal branch is supplied on the left lateral wall.  40% proximal LAD with 60 to 70% diffuse mid to distal LAD. First diagonal contains eccentric 50 to 70% narrowing.  Right coronary is  diffusely diseased with mid 60  to 65% stenosis. Distal 75% stenosis before small PDA. 99% stenosis in the continuation of the right coronary before the second PLA. The acute marginal branch of the right coronary has multifocal 95% stenoses.  Low normal LVEF approximately 50% with normal EDP.  RECOMMENDATIONS:   Aggressive risk preventive therapy with LDL target less than 55.  Add long-acting nitrate therapy to improve exertional dyspnea and chest tightness.  If unacceptable symptoms, would consider multivessel coronary bypass grafting. In absence of high-grade proximal CAD, I feel a measured approach attempting to control symptoms with medications is a reasonable first option.     ASSESSMENT & PLAN:   1. CAD - s/p CABG x 3 per EBG from January of 2021 - doing well overall - on statin/aspirin/beta blocker. Needs to continue with CV risk factor modification.   2. Persistent AF - has had for ma- rate is fine - he remains on anticoagulation. He did have clipping of the LA - there is no plan to attempt to restore to NSR.  3. HLD - on statin therapy - would continue  4. Chronic anticoagulation - no problems - lab today.   5. Mild lower extremity edema - improved - changing Lasix to just prn - BMET today.    6. AB thymoma - no plan for radiation - he is to have repeat scan per EBG  7. HTN - BP is fair - I have asked him to monitor some for [place] at home.   8. Lung nodule - not discussed.   Current medicines are reviewed with the patient today.  The patient does not have concerns regarding medicines other than what has been noted above.  The following changes have been made:  See above.  Labs/ tests ordered today include:    Orders Placed This Encounter  Procedures  . Basic metabolic panel  . CBC     Disposition:   FU with me in 4 months. Baseline lab today. Continue with current regimen for now.    Patient is agreeable to this plan and will call if any problems  develop in the interim.   SignedTruitt Merle, NP  02/04/2020 10:53 AM  [facility] [address] [place], [place]  [address] Phone: [phone] Fax: [phone]

## 2020-01-27 ENCOUNTER — Other Ambulatory Visit: Payer: Self-pay | Admitting: Internal Medicine

## 2020-01-27 ENCOUNTER — Other Ambulatory Visit (HOSPITAL_COMMUNITY): Payer: Self-pay | Admitting: Hematology & Oncology

## 2020-01-27 ENCOUNTER — Ambulatory Visit
Admission: RE | Admit: 2020-01-27 | Discharge: 2020-01-27 | Disposition: A | Discharge status: Home / Self Care | Payer: Medicare Other | Source: Ambulatory Visit | Attending: Internal Medicine | Admitting: Internal Medicine

## 2020-01-27 ENCOUNTER — Other Ambulatory Visit: Payer: Self-pay | Admitting: Hematology & Oncology

## 2020-01-27 DIAGNOSIS — R1902 Left upper quadrant abdominal swelling, mass and lump: Secondary | ICD-10-CM

## 2020-01-28 ENCOUNTER — Ambulatory Visit (HOSPITAL_COMMUNITY)
Admission: RE | Admit: 2020-01-28 | Discharge: 2020-01-28 | Disposition: A | Discharge status: Home / Self Care | Payer: Medicare Other | Source: Ambulatory Visit | Attending: Hematology & Oncology | Admitting: Hematology & Oncology

## 2020-01-28 ENCOUNTER — Other Ambulatory Visit: Payer: Self-pay

## 2020-01-28 DIAGNOSIS — R1902 Left upper quadrant abdominal swelling, mass and lump: Secondary | ICD-10-CM | POA: Diagnosis not present

## 2020-01-28 MED ORDER — IOHEXOL 300 MG/ML  SOLN
100.0000 mL | Freq: Once | INTRAMUSCULAR | Status: AC | PRN
  Administered 2020-01-28: 100 mL via INTRAVENOUS

## 2020-02-04 ENCOUNTER — Other Ambulatory Visit: Payer: Self-pay

## 2020-02-04 ENCOUNTER — Encounter: Payer: Self-pay | Admitting: Nurse Practitioner

## 2020-02-04 ENCOUNTER — Ambulatory Visit: Payer: Medicare Other | Admitting: Nurse Practitioner

## 2020-02-04 VITALS — BP 140/80 | HR 55 | Ht 72.0 in | Wt 218.2 lb

## 2020-02-04 DIAGNOSIS — I10 Essential (primary) hypertension: Secondary | ICD-10-CM | POA: Diagnosis not present

## 2020-02-04 DIAGNOSIS — Z951 Presence of aortocoronary bypass graft: Secondary | ICD-10-CM | POA: Diagnosis not present

## 2020-02-04 DIAGNOSIS — E785 Hyperlipidemia, unspecified: Secondary | ICD-10-CM

## 2020-02-04 DIAGNOSIS — I259 Chronic ischemic heart disease, unspecified: Secondary | ICD-10-CM | POA: Diagnosis not present

## 2020-02-04 LAB — BASIC METABOLIC PANEL
BUN/Creatinine Ratio: 18 (ref 10–24)
BUN: 20 mg/dL (ref 8–27)
CO2: 24 mmol/L (ref 20–29)
Calcium: 8.7 mg/dL (ref 8.6–10.2)
Chloride: 102 mmol/L (ref 96–106)
Creatinine, Ser: 1.11 mg/dL (ref 0.76–1.27)
GFR calc Af Amer: 74 mL/min/{1.73_m2} (ref >59)
GFR calc non Af Amer: 64 mL/min/{1.73_m2} (ref >59)
Glucose: 93 mg/dL (ref 65–99)
Potassium: 4.1 mmol/L (ref 3.5–5.2)
Sodium: 141 mmol/L (ref 134–144)

## 2020-02-04 LAB — CBC
Hematocrit: 40.8 % (ref 37.5–51.0)
Hemoglobin: 14 g/dL (ref 13.0–17.7)
MCH: 31.5 pg (ref 26.6–33.0)
MCHC: 34.3 g/dL (ref 31.5–35.7)
MCV: 92 fL (ref 79–97)
Platelets: 116 10*3/uL — ABNORMAL LOW (ref 150–450)
RBC: 4.44 x10E6/uL (ref 4.14–5.80)
RDW: 13.4 % (ref 11.6–15.4)
WBC: 6.5 10*3/uL (ref 3.4–10.8)

## 2020-02-04 MED ORDER — FUROSEMIDE 20 MG PO TABS: 20.0000 mg | ORAL_TABLET | Freq: Every day | ORAL | 2 refills | Status: DC | PRN

## 2020-02-04 NOTE — Patient Instructions (Addendum)
After Visit Summary:  We will be checking the following labs today - BMET and CBC  Medication Instructions:    Continue with your current medicines. BUT  I am changing the Lasix to only as needed - for swelling/weight gain, etc.    If you need a refill on your cardiac medications before your next appointment, please call your pharmacy.     Testing/Procedures To Be Arranged:  N/A  Follow-Up:   See me in 4 months.     At [facility], you and your health needs are our priority.  As part of our continuing mission to provide you with exceptional heart care, we have created designated Provider Care Teams.  These Care Teams include your primary Cardiologist (physician) and Advanced Practice Providers (APPs -  Physician Assistants and Nurse Practitioners) who all work together to provide you with the care you need, when you need it.  Special Instructions:  . Stay safe, wash your hands for at least 20 seconds and wear a mask when needed.  . It was good to talk with you today.  Marland Kitchen Keep a check on your BP for me - just once or twice a day   Call the [place] office at [phone] if you have any questions, problems or concerns.

## 2020-02-12 ENCOUNTER — Ambulatory Visit: Payer: Medicare Other | Admitting: Cardiothoracic Surgery

## 2020-02-12 ENCOUNTER — Other Ambulatory Visit: Payer: Medicare Other

## 2020-02-23 ENCOUNTER — Ambulatory Visit
Admission: RE | Admit: 2020-02-23 | Discharge: 2020-02-23 | Disposition: A | Discharge status: Home / Self Care | Payer: Medicare Other | Source: Ambulatory Visit | Attending: Cardiothoracic Surgery | Admitting: Cardiothoracic Surgery

## 2020-02-23 DIAGNOSIS — C37 Malignant neoplasm of thymus: Secondary | ICD-10-CM

## 2020-02-24 ENCOUNTER — Other Ambulatory Visit (HOSPITAL_COMMUNITY): Payer: Self-pay | Admitting: Internal Medicine

## 2020-02-24 ENCOUNTER — Other Ambulatory Visit: Payer: Self-pay | Admitting: Internal Medicine

## 2020-02-24 DIAGNOSIS — R1906 Epigastric swelling, mass or lump: Secondary | ICD-10-CM

## 2020-02-25 NOTE — Progress Notes (Signed)
[address]       [place] [address]             [phone]      Tina A Shaheed [facility] Medical Record #[id number] Date of Birth: May 03, 1946  Referring: Grace Isaac, MD Primary Care: Leanna Battles, MD Primary Cardiologist: No primary care provider on file.   Chief Complaint:   POST OP FOLLOW UP OPERATIVE REPORT DATE OF PROCEDURE:  05/27/2019 PREOPERATIVE DIAGNOSIS:  Coronary occlusive disease with refractory angina. POSTOPERATIVE DIAGNOSES:  Coronary occlusive disease with refractory angina, anterior mediastinal mass. PROCEDURE PERFORMED: 1 Coronary artery bypass grafting x3 with the left internal mammary to the left anterior descending coronary artery, reverse saphenous vein graft to the diagonal coronary artery, reverse saphenous vein graft to the distal right  coronary artery with right greater saphenous thigh endoscopic vein harvesting 2. Placement of Atriclip- Left Atrial Clip 45 mm  3. Sampling of left internal mammary lymph node 4. 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 Sofie Hartigan radiation oncology yesterday and the consensus is to continue with close follow-up and not treat with radiation therapy for the resected thymoma.  Since last seen the patient has continued to increase his physical activity appropriately.    Patient comes in today for follow-up CT of the chest-follow-up after resection of incidental anterior mediastinal mass at the time of bypass surgery  Patient has a history of prostate cancer treated with radiation seeds.   Past Medical History:  Diagnosis Date  . A-fib ([place])   . Arrhythmia    hx. Atrial Fib. ,"with  regurgitation"  . Depression   . Diabetes mellitus   . Dysrhythmia    a. fib-on Eliquis  . Heart murmur   . Hx of echocardiogram    Echo 3/16:  Mild LVH, EF 60-65%, mild MR, severe LAE, mild RAE, PASP 32 mmHg  . Hypercholesteremia   . Hypertension   . Macular degeneration of both eyes   . OSA on CPAP   . Osteoarthritis (arthritis due to wear and tear of joints)   . Palpitations   . Prostate cancer ([place])    seed implantion- 15 to 20 yrs ago  . Sleep apnea    uses CPAP-settings 5  . 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 Ureter, stricture    hx. 2 yrs ago-stent has been removed now     Social History   Tobacco Use  Smoking Status Former Smoker  . Quit date: 05/15/1973  . Years since quitting: 46.8  Smokeless Tobacco Never Used    Social History   Substance and Sexual Activity  Alcohol Use Yes   Comment: "2 cocktails twice a week"     Allergies  Allergen Reactions  . Lisinopril Cough  . Sulfa Drugs Cross Reactors     CHILDHOOD UNSPECIFIED REACTION   . Contrast Media [Iodinated Diagnostic Agents] Rash  . Doxycycline Itching  . Ioxaglate Rash  . Tetracyclines & Related Rash    Current Outpatient Medications  Medication Sig Dispense Refill  . allopurinol (ZYLOPRIM) 300 MG tablet Take 300 mg by mouth daily.    Marland Kitchen aspirin EC 81 MG EC tablet  Take 1 tablet (81 mg total) by mouth daily. 30 tablet 0  . atorvastatin (LIPITOR) 40 MG tablet Take 1 tablet (40 mg total) by mouth daily at 6 PM. 90 tablet 2  . ELIQUIS 5 MG TABS tablet TAKE 1 TABLET BY MOUTH  TWICE DAILY 180 tablet 3  . furosemide (LASIX) 20 MG tablet Take 1 tablet (20 mg total) by mouth daily as needed for fluid or edema. 90 tablet 2  . losartan (COZAAR) 100 MG tablet Take 100 mg by mouth daily.    . metFORMIN (GLUCOPHAGE) 1000 MG tablet Take 500 mg by mouth daily with breakfast.    . metoprolol tartrate (LOPRESSOR) 25 MG tablet Take 1 tablet (25 mg total) by mouth 2 (two) times daily. 180 tablet 2  . Multiple  Vitamin (MULTIVITAMIN WITH MINERALS) TABS tablet Take 1 tablet by mouth daily. Centrum Silver    . Multiple Vitamins-Minerals (PRESERVISION AREDS 2) CAPS Take 1 capsule by mouth 2 (two) times daily.      No current facility-administered medications for this visit.       Physical Exam: BP 140/87   Pulse 60   Temp 97.9 F (36.6 C) (Skin)   Resp 20   Ht 6' (1.829 m)   Wt 220 lb (99.8 kg)   SpO2 95% Comment: RA  BMI 29.84 kg/m   General appearance: alert and no distress Neck: no adenopathy, no carotid bruit, no JVD, supple, symmetrical, trachea midline and thyroid not enlarged, symmetric, no tenderness/mass/nodules Resp: clear to auscultation bilaterally Back: symmetric, no curvature. ROM normal. No CVA tenderness. Cardio: regular rate and rhythm, S1, S2 normal, no murmur, click, rub or gallop GI: soft, non-tender; bowel sounds normal; no masses,  no organomegaly Extremities: extremities normal, atraumatic, no cyanosis or edema and Homans sign is negative, no sign of DVT Neurologic: Grossly normal The patient does have a small incisional hernia at the base of his sternotomy incision that is easily reducible approximately  1 1/2 cm in size.  There is a palpable 2 cm soft tissue lesion in the subcutaneous fat to the left anterior abdominal wall just above the umbilicus  Diagnostic Studies & Laboratory data:  CT Chest Wo Contrast  Result Date: 02/23/2020 CLINICAL DATA:  Thymoma, six-month follow-up.  Prostate cancer. EXAM: CT CHEST WITHOUT CONTRAST TECHNIQUE: Multidetector CT imaging of the chest was performed following the standard protocol without IV contrast. COMPARISON:  CT abdomen pelvis 01/28/2020 and coronary CT 03/03/2019. FINDINGS: Cardiovascular: Atherosclerotic calcification of the aorta and aortic valve. Pulmonic trunk and heart are enlarged. No pericardial effusion. Mediastinum/Nodes: Interval resection of previously seen prevascular soft tissue mass. Residual mediastinal  lymph nodes measure up to 10 mm in the low right paratracheal station. Hilar regions are difficult to evaluate without IV contrast. No axillary adenopathy. Esophagus is grossly unremarkable. Lungs/Pleura: Centrilobular emphysema. Question mild bibasilar subpleural reticulation and traction bronchiolectasis. Scattered subpleural lymph nodes. Scattered pulmonary nodules measure up to 6 mm in the peripheral left lower lobe, the largest of which is unchanged from 03/03/2019. Others were not imaged on the prior study. Mild pleuroparenchymal scarring in the left lower lobe. No pleural fluid. Airway is unremarkable. Upper Abdomen: Visualized portions of the liver, adrenal glands, left kidney, spleen, pancreas, stomach and bowel are grossly unremarkable. Musculoskeletal: Degenerative changes in the spine. Probable hemangioma in the midthoracic spine. No worrisome lytic or sclerotic lesions. IMPRESSION: 1. Interval thymoma resection with borderline enlarged mediastinal lymph nodes. This examination will serve as baseline for future comparison. 2. Possible  mild basilar pulmonary fibrosis. 3. Scattered millimetric pulmonary nodules. No follow-up needed if patient is low-risk (and has no known or suspected primary neoplasm). Non-contrast chest CT can be considered in 12 months if patient is high-risk. This recommendation follows the consensus statement: Guidelines for Management of Incidental Pulmonary Nodules Detected on CT Images: From the Fleischner Society 2017; Radiology 2017; 284:228-243. 4. Low-dose CT lung cancer screening is recommended for patients who are 81-42 years of age with a 30+ pack-year history of smoking, and who are currently smoking or quit <=15 years ago. 5.  Aortic atherosclerosis (ICD10-I70.0). 6. Enlarged pulmonic trunk, indicative of pulmonary arterial hypertension. 7.  Emphysema (ICD10-J43.9). Electronically Signed   By: Lorin Picket M.D.   On: 02/23/2020 10:09   Patient is not eligible for lung  cancer screening CTs has he quit smoking in [place]  Result Date: 01/28/2020 CLINICAL DATA:  Left upper quadrant mass EXAM: CT ABDOMEN AND PELVIS WITH CONTRAST TECHNIQUE: Multidetector CT imaging of the abdomen and pelvis was performed using the standard protocol following bolus administration of intravenous contrast. CONTRAST:  118mL OMNIPAQUE IOHEXOL 300 MG/ML SOLN. The patient was pre-medicated due to contrast allergy. No immediate complications. COMPARISON:  09/21/2013 FINDINGS: Lower chest: Cardiomegaly. Lung bases clear. Calcifications in the visualized right coronary artery. Hepatobiliary: Small layering stones within the gallbladder. Diffuse fatty infiltration of the liver. No focal hepatic abnormality. Pancreas: No focal abnormality or ductal dilatation. Spleen: Splenomegaly with craniocaudal length of 14.5 cm. No focal abnormality. Small cysts within Adrenals/Urinary Tract: The kidneys bilaterally. No hydronephrosis. Adrenal glands and urinary bladder unremarkable. Stomach/Bowel: Sigmoid diverticulosis. No active diverticulitis. Appendix is normal. Stomach and small bowel unremarkable. Vascular/Lymphatic: Aortic atherosclerosis. No evidence of aneurysm or adenopathy. Reproductive: Radiation seeds in the region of the prostate. Other: No free fluid or free air. Musculoskeletal: Small sclerotic foci within the right femoral head, stable. Sclerotic focus in the right posterior acetabulum larger than prior study. Small sclerotic foci within the left sacrum and left iliac bone, stable. Degenerative changes in the lumbar spine. IMPRESSION: Small layering gallstones. Diffuse fatty infiltration of the liver. Splenomegaly, similar to prior study. Aortic atherosclerosis. Sigmoid diverticulosis.  No active diverticulitis. Several sclerotic foci within the bony pelvis, most of which are stable since prior study. There is a small sclerotic focus within the right posterior acetabulum which  has enlarged since prior study. Recommend correlation with PSA values. Electronically Signed   By: Rolm Baptise M.D.   On: 01/28/2020 16:50   After reviewing the patient's abdominal CT scan I have sent a note to radiology to please review the scan as  On images there is an obvious lesion in the left lower abdominal wall,  on physical exam and also on the scan but not mentioned in  Xray report   Recent Lab Findings: Lab Results  Component Value Date   WBC 6.5 02/04/2020   HGB 14.0 02/04/2020   HCT 40.8 02/04/2020   PLT 116 (L) 02/04/2020   GLUCOSE 93 02/04/2020   CHOL 120 09/30/2019   TRIG 91 09/30/2019   HDL 41 09/30/2019   LDLCALC 61 09/30/2019   ALT 42 09/30/2019   AST 34 09/30/2019   NA 141 02/04/2020   K 4.1 02/04/2020   CL 102 02/04/2020   CREATININE 1.11 02/04/2020   BUN 20 02/04/2020   CO2 24 02/04/2020   INR 1.6 (H) 05/27/2019   HGBA1C 5.5 05/23/2019   SURGICAL PATHOLOGY   patient returns to the  office* THIS IS AN AMENDED REPORT CASE: [id number]  PATIENT: [facility]  Surgical Pathology Report   Amendment    Reason for Amendment #1: Other  Reason for Amendment #2: Additional clinical/test information   Clinical History: CAD, AFIB (cm)   FINAL MICROSCOPIC DIAGNOSIS:   A. LYMPH NODE, LEFT INTERNAL MAMMARY, BIOPSY:  - Lymph node, benign.   B. MEDIASTINAL MASS, LEFT ANTERIOR, EXCISION:  - Type AB thymoma, 3.1 cm; Please see oncology table.   ONCOLOGY TABLE:   THYMUS:   Procedure: Thymectomy  Tumor Size: 3.1 cm  Histologic Type: Type AB thymoma  Transcapsular Invasion: Absent  Tumor Extension: Tumor confined to thymus  Margins: Equivocal (See Amendment Note #1)  Treatment Effect: No known presurgical therapy  Lymphovascular Invasion: Not identified  Regional Lymph Nodes:    Number of Lymph Nodes Involved: 0    Number of Lymph Nodes Examined: 1  Pathologic Stage Classification (pTNM, AJCC 8th Edition): pT1a, pN0 (See  Amendment Note #2)    Ancillary Studies: Can be performed up request  Representative Tumor Block: B2  Comment: Dr. Tresa Moore concurs with the histologic type.  (v4.0.0.1)   AMENDMENT NOTE #1: DIAGNOSTIC INFORMATION HAS NOT BEEN CHANGED. The  originating pathologist considered the black inked margin uninvolved by  tumor. Upon retrospective review for conference, the reviewing  pathologist considered the black inked margin focally involved by tumor.  It is acknowledged that interobserver variability can occur in this  setting, and as such, the margin status is deemed equivocal. The final  report should read Margins: Equivocal INSTEAD OF Margins: Uninvolved by  tumor. This has been corrected.   AMENDMENT NOTE #2: DIAGNOSTIC INFORMATION HAS NOT BEEN CHANGED.  Additional clinical information obtained regarding specimen A/left  internal mammary lymph node indicates that it should be included in the  pathologic stage classification. The final report should read pN0  INSTEAD OF pNX. This has been corrected.     INTRAOPERATIVE DIAGNOSIS:   B. Left anterior mediastinal mass: "Malignant neoplasm-differential  diagnosis includes thymoma and carcinoma. Defer to permanent sections  for final diagnosis."  Intraoperative diagnosis rendered by Dr. Vic Ripper at 11:39 AM on  05/27/2019.   GROSS DESCRIPTION:   A: Received fresh is a 0.7 cm tan-pink lymph node. A portion is placed  in RPMI. The remainder is entirely submitted in 1 cassette.   B: Received fresh for rapid intraoperative consult is a 3.1 x 2.7 x 1.8  cm tan-red ovoid nodule which grossly appears encapsulated. The cut  surfaces are solid and tan-pink to pale red. A section is submitted for  frozen section. The remainder of the specimen is entirely submitted in  4 cassettes.  1 = tissue submitted for frozen section.  2-5 = remainder of specimen (GRP 05/27/2019).    Final Diagnosis performed by Gillie Manners, MD.  Electronically  signed  05/29/2019  Amendment #1 performed by Gillie Manners, MD.  Electronically signed  06/04/2019  Amendment #2 performed by Gillie Manners, MD.  Electronically signed  06/09/2019  Technical and / or Professional components performed at [facility], [place] [address] [place], [place] [address].  Immunohistochemistry Technical component (if applicable) was performed  at [facility]. [address] [place],  [place], [place] [address].  IMMUNOHISTOCHEMISTRY DISCLAIMER (if applicable):  Some of these immunohistochemical stains may have been developed and the  performance characteristics determine by [facility]. Some  may not have been cleared or approved by the U.S. Food and Drug  Administration. The FDA has determined that such clearance or approval  is not necessary. This test is used for clinical purposes. It should not  be regarded as investigational or for research. This laboratory is  certified under the [place]  (CLIA-88) as qualified to perform high complexity clinical laboratory  testing. The controls stained appropriately. Path:    Assessment / Plan:      #1 patient stable making good progress following coronary artery bypass grafting-he has returned to normal activities without recurrent angina or evidence of congestive heart failure #2 thymoma-clinically this was not invasive-encapsulated and easily resected.  Follow-up pathology shows equivocal microscopic margin-patient has been seen by radiation oncology and the consensus is not to proceed with radiation but close follow-up-plan to see him back in 6 months with a noncontrasted CT scan of the chest #3-incidentally on the patient's visit it was noted that he has a soft tissue lesion in his left lower abdominal wall -CT of the abdomen ordered by Dr Sharlett Iles  suggests possible sclerotic bony lesion-issue of his PSA levels and follow-up  for prostate cancer is suggested the patient notes that he is to have an MRI of the abdomen for further evaluation in the near future  Patient will discuss his follow-up for prostate cancer with his urologist and possible referral for surgical removal of the left abdominal wall mass and/or needle biopsy as appropriate depending on scanning.  Patient will return to this thoracic surgery office in 1 year with a follow-up CT of the chest-  Medication Changes: No orders of the defined types were placed in this encounter.     Grace Isaac MD      [address] [place] [address] Office [phone]     02/27/2020 10:48 AM

## 2020-02-26 ENCOUNTER — Ambulatory Visit: Payer: Medicare Other | Admitting: Cardiothoracic Surgery

## 2020-02-26 ENCOUNTER — Other Ambulatory Visit: Payer: Self-pay

## 2020-02-26 VITALS — BP 140/87 | HR 60 | Temp 97.9°F | Resp 20 | Ht 72.0 in | Wt 220.0 lb

## 2020-02-26 DIAGNOSIS — C37 Malignant neoplasm of thymus: Secondary | ICD-10-CM | POA: Diagnosis not present

## 2020-02-27 ENCOUNTER — Ambulatory Visit (HOSPITAL_COMMUNITY)
Admission: RE | Admit: 2020-02-27 | Discharge: 2020-02-27 | Disposition: A | Discharge status: Home / Self Care | Payer: Medicare Other | Source: Ambulatory Visit | Attending: Internal Medicine | Admitting: Internal Medicine

## 2020-02-27 DIAGNOSIS — R1906 Epigastric swelling, mass or lump: Secondary | ICD-10-CM | POA: Diagnosis not present

## 2020-02-27 MED ORDER — GADOBUTROL 1 MMOL/ML IV SOLN
9.0000 mL | Freq: Once | INTRAVENOUS | Status: AC | PRN
  Administered 2020-02-27: 9 mL via INTRAVENOUS

## 2020-04-12 ENCOUNTER — Telehealth: Payer: Self-pay

## 2020-04-12 NOTE — Telephone Encounter (Signed)
   [facility] Pre-operative Risk Assessment    HEARTCARE STAFF: - Please ensure there is not already an duplicate clearance open for this procedure. - Under Visit Info/Reason for Call, type in Other and utilize the format Clearance MM/DD/YY or Clearance TBD. Do not use dashes or single digits. - If request is for dental extraction, please clarify the # of teeth to be extracted.  Request for surgical clearance:  1. What type of surgery is being performed? Excision of subcutaneous mass-abdomen   2. When is this surgery scheduled? TBD   3. What type of clearance is required (medical clearance vs. Pharmacy clearance to hold med vs. Both)? Both  4. Are there any medications that need to be held prior to surgery and how long? Eliquis & Aspirin  5. Practice name and name of physician performing surgery? [facility], Dr. Dema Severin    6. What is the office phone number? [phone]   7.   What is the office fax number? [phone]  8.   Anesthesia type (None, local, MAC, general) ? Modified anesthesia    [name] 04/12/2020, 3:19 PM  _________________________________________________________________   (provider comments below)

## 2020-04-12 NOTE — Telephone Encounter (Signed)
[name], pt with need for surgery now 11 months out from [place],  Was going to hold ASA for procedure unless you feel he should continue.   Pharmacy is giving recommendations for eliquis  thanks

## 2020-04-12 NOTE — Telephone Encounter (Signed)
Patient with diagnosis of atrial fibrililation on Eliquis for anticoagulation.    Procedure: excision of subcutaneous mass-abdomen Date of procedure: TBD  CHA2DS2-VASc Score = 5  This indicates a 7.2% annual risk of stroke. The patient's score is based upon: CHF History: 0 HTN History: 1 Diabetes History: 1 Stroke History: 0 Vascular Disease History: 1 Age Score: 2 Gender Score: 0   CrCl 79.9 ml/min Platelet count 116  Per office protocol, patient can hold Eliquis for 1 day prior to procedure.

## 2020-04-12 NOTE — Telephone Encounter (Signed)
Pharm could you address eliqis?

## 2020-04-12 NOTE — Telephone Encounter (Signed)
[facility] 04/12/20

## 2020-04-12 NOTE — Telephone Encounter (Signed)
I think its ok to hold his aspirin  [name]

## 2020-04-19 NOTE — Telephone Encounter (Signed)
Have discussed anticoagulation with Dr. Dema Severin.   He would like to hold Eliquis for 48 hours prior - continue aspirin.   Will resume Eliquis as soon as Dr. Dema Severin feel's is feasible.   Discussed with pharmacy here - [facility] - who agrees with this plan as well.   Burtis Junes, RN, [place] [address] [place], [place]  [address] [phone]

## 2020-04-19 NOTE — Telephone Encounter (Signed)
Per pharmacy - ok to hold Eliquis for one day. Hold aspirin for 3 days. Resume when ok with General Surgery.   Burtis Junes, RN, [place] [address] [place], [place]  [address] [phone]

## 2020-04-19 NOTE — Telephone Encounter (Signed)
Pt calling in today to see when pt's Surgical Clearance was going to be sent to Dr. Dema Severin at [facility] for upcoming procedure.  Looks like it was taken care of but never sent.  Will advise with Truitt Merle, NP to make sure this gets sent today.

## 2020-05-01 ENCOUNTER — Emergency Department (HOSPITAL_BASED_OUTPATIENT_CLINIC_OR_DEPARTMENT_OTHER)
Admission: EM | Admit: 2020-05-01 | Discharge: 2020-05-01 | Disposition: A | Discharge status: Home / Self Care | Payer: Medicare Other | Attending: Emergency Medicine | Admitting: Emergency Medicine

## 2020-05-01 ENCOUNTER — Encounter (HOSPITAL_BASED_OUTPATIENT_CLINIC_OR_DEPARTMENT_OTHER): Payer: Self-pay | Admitting: Emergency Medicine

## 2020-05-01 ENCOUNTER — Emergency Department (HOSPITAL_BASED_OUTPATIENT_CLINIC_OR_DEPARTMENT_OTHER): Payer: Medicare Other

## 2020-05-01 ENCOUNTER — Other Ambulatory Visit: Payer: Self-pay

## 2020-05-01 DIAGNOSIS — Z96652 Presence of left artificial knee joint: Secondary | ICD-10-CM | POA: Diagnosis not present

## 2020-05-01 DIAGNOSIS — N200 Calculus of kidney: Secondary | ICD-10-CM | POA: Diagnosis not present

## 2020-05-01 DIAGNOSIS — Z7984 Long term (current) use of oral hypoglycemic drugs: Secondary | ICD-10-CM | POA: Diagnosis not present

## 2020-05-01 DIAGNOSIS — E119 Type 2 diabetes mellitus without complications: Secondary | ICD-10-CM | POA: Insufficient documentation

## 2020-05-01 DIAGNOSIS — Z87891 Personal history of nicotine dependence: Secondary | ICD-10-CM | POA: Diagnosis not present

## 2020-05-01 DIAGNOSIS — Z7901 Long term (current) use of anticoagulants: Secondary | ICD-10-CM | POA: Insufficient documentation

## 2020-05-01 DIAGNOSIS — Z7982 Long term (current) use of aspirin: Secondary | ICD-10-CM | POA: Diagnosis not present

## 2020-05-01 DIAGNOSIS — I10 Essential (primary) hypertension: Secondary | ICD-10-CM | POA: Diagnosis not present

## 2020-05-01 DIAGNOSIS — Z951 Presence of aortocoronary bypass graft: Secondary | ICD-10-CM | POA: Insufficient documentation

## 2020-05-01 DIAGNOSIS — Z79899 Other long term (current) drug therapy: Secondary | ICD-10-CM | POA: Diagnosis not present

## 2020-05-01 DIAGNOSIS — Z8546 Personal history of malignant neoplasm of prostate: Secondary | ICD-10-CM | POA: Insufficient documentation

## 2020-05-01 DIAGNOSIS — R109 Unspecified abdominal pain: Secondary | ICD-10-CM | POA: Diagnosis present

## 2020-05-01 LAB — COMPREHENSIVE METABOLIC PANEL
ALT: 39 U/L (ref 0–44)
AST: 35 U/L (ref 15–41)
Albumin: 4.8 g/dL (ref 3.5–5.0)
Alkaline Phosphatase: 110 U/L (ref 38–126)
Anion gap: 9 (ref 5–15)
BUN: 26 mg/dL — ABNORMAL HIGH (ref 8–23)
CO2: 23 mmol/L (ref 22–32)
Calcium: 9.2 mg/dL (ref 8.9–10.3)
Chloride: 107 mmol/L (ref 98–111)
Creatinine, Ser: 1.36 mg/dL — ABNORMAL HIGH (ref 0.61–1.24)
GFR, Estimated: 54 mL/min — ABNORMAL LOW (ref >60)
Glucose, Bld: 128 mg/dL — ABNORMAL HIGH (ref 70–99)
Potassium: 4.3 mmol/L (ref 3.5–5.1)
Sodium: 139 mmol/L (ref 135–145)
Total Bilirubin: 1.2 mg/dL (ref 0.3–1.2)
Total Protein: 7.4 g/dL (ref 6.5–8.1)

## 2020-05-01 LAB — URINALYSIS, ROUTINE W REFLEX MICROSCOPIC
Glucose, UA: NEGATIVE mg/dL
Ketones, ur: NEGATIVE mg/dL
Leukocytes,Ua: NEGATIVE
Nitrite: NEGATIVE
Protein, ur: 100 mg/dL — AB
Specific Gravity, Urine: >1.03 — ABNORMAL HIGH (ref 1.005–1.030)
pH: 5.5 (ref 5.0–8.0)

## 2020-05-01 LAB — CBC
HCT: 46.8 % (ref 39.0–52.0)
Hemoglobin: 15.8 g/dL (ref 13.0–17.0)
MCH: 31.1 pg (ref 26.0–34.0)
MCHC: 33.8 g/dL (ref 30.0–36.0)
MCV: 92.1 fL (ref 80.0–100.0)
Platelets: 147 10*3/uL — ABNORMAL LOW (ref 150–400)
RBC: 5.08 MIL/uL (ref 4.22–5.81)
RDW: 13.2 % (ref 11.5–15.5)
WBC: 5.8 10*3/uL (ref 4.0–10.5)
nRBC: 0 % (ref 0.0–0.2)

## 2020-05-01 LAB — URINALYSIS, MICROSCOPIC (REFLEX): RBC / HPF: >50 RBC/hpf (ref 0–5)

## 2020-05-01 LAB — LIPASE, BLOOD: Lipase: 39 U/L (ref 11–51)

## 2020-05-01 LAB — CBG MONITORING, ED: Glucose-Capillary: 141 mg/dL — ABNORMAL HIGH (ref 70–99)

## 2020-05-01 MED ORDER — ONDANSETRON HCL 4 MG/2ML IJ SOLN
4.0000 mg | Freq: Once | INTRAMUSCULAR | Status: AC
  Administered 2020-05-01: 4 mg via INTRAVENOUS
  Filled 2020-05-01: qty 2

## 2020-05-01 MED ORDER — HYDROMORPHONE HCL 1 MG/ML IJ SOLN
1.0000 mg | Freq: Once | INTRAMUSCULAR | Status: AC
  Administered 2020-05-01: 1 mg via INTRAVENOUS
  Filled 2020-05-01: qty 1

## 2020-05-01 MED ORDER — OXYCODONE-ACETAMINOPHEN 5-325 MG PO TABS: 1.0000 | ORAL_TABLET | ORAL | 0 refills | Status: DC | PRN

## 2020-05-01 MED ORDER — TAMSULOSIN HCL 0.4 MG PO CAPS: 0.4000 mg | ORAL_CAPSULE | Freq: Every day | ORAL | 0 refills | Status: DC

## 2020-05-01 MED ORDER — ONDANSETRON 4 MG PO TBDP: 4.0000 mg | ORAL_TABLET | Freq: Three times a day (TID) | ORAL | 0 refills | Status: DC | PRN

## 2020-05-01 NOTE — ED Notes (Signed)
Pt given crackers.  

## 2020-05-01 NOTE — Discharge Instructions (Signed)
Schedule to see the Urologist for evaluation  

## 2020-05-01 NOTE — ED Notes (Signed)
CBG - 141 ° °

## 2020-05-01 NOTE — ED Provider Notes (Signed)
[facility] EMERGENCY DEPARTMENT Provider Note   CSN: [id number] Arrival date & time: 05/01/20  1129     History Chief Complaint  Patient presents with  . Hematuria  . Abdominal Pain    [name] is a 74 y.o. male.  The history is provided by the patient. No language interpreter was used.  Hematuria This is a new problem. Episode onset: 3 days. The problem occurs constantly. The problem has been gradually worsening. Associated symptoms include abdominal pain. Nothing aggravates the symptoms. Nothing relieves the symptoms. He has tried nothing for the symptoms. The treatment provided no relief.  Abdominal Pain Associated symptoms: hematuria   Pt complains of hematuria and right flank pain for the past 3 days.  Pt reports increased blood in urine. Pt sees Dr. Junious Silk.      Past Medical History:  Diagnosis Date  . A-fib ([place])   . Arrhythmia    hx. Atrial Fib. ,"with regurgitation"  . Depression   . Diabetes mellitus   . Dysrhythmia    a. fib-on Eliquis  . Heart murmur   . Hx of echocardiogram    Echo 3/16:  Mild LVH, EF 60-65%, mild MR, severe LAE, mild RAE, PASP 32 mmHg  . Hypercholesteremia   . Hypertension   . Macular degeneration of both eyes   . OSA on CPAP   . Osteoarthritis (arthritis due to wear and tear of joints)   . Palpitations   . Prostate cancer ([place])    seed implantion- 15 to 20 yrs ago  . Sleep apnea    uses CPAP-settings 5  . 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 Ureter, stricture    hx. 2 yrs ago-stent has been removed now    Patient Active Problem List   Diagnosis Date Noted  . 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 Angina pectoris ([place])   . OSA on CPAP 11/01/2017  . Lumbar spinal stenosis 09/27/2017  . Primary osteoarthritis of left knee 08/06/2014  . Left knee DJD 08/06/2014  . Preoperative clearance 02/28/2012  . Exertional dyspnea 10/02/2011  . Atrial fibrillation ([place])   . Prostate cancer ([place])    . Palpitations   . Diabetes mellitus   . Hypercholesteremia     Past Surgical History:  Procedure Laterality Date  . CLIPPING OF ATRIAL APPENDAGE N/A 05/27/2019   Procedure: CLIPPING OF ATRIAL APPENDAGE - Using AtriCure Clip size 45MM;  Surgeon: Grace Isaac, MD;  Location: [facility];  Service: Open Heart Surgery;  Laterality: N/A;  . COLONOSCOPY  2007  . CORONARY ARTERY BYPASS GRAFT N/A 05/27/2019   Procedure: CORONARY ARTERY BYPASS GRAFTING (CABG) x Three , using left internal mammary artery and right leg greater saphenous vein harvested endoscopically LIMA to LAD, SVG to DIAG, SVG to RCA. Incision of left internal mammary lymph node Incision of Left Internal  Mediastinal Mass;  Surgeon: Grace Isaac, MD;  Location: [facility];  Service: Open Heart Surgery;  Laterality: N/A;  . CYSTOSCOPY W/ RETROGRADES  04/04/2012   Procedure: CYSTOSCOPY WITH RETROGRADE PYELOGRAM;  Surgeon: Molli Hazard, MD;  Location: [facility];  Service: Urology;  Laterality: Bilateral;  . CYSTOSCOPY WITH RETROGRADE PYELOGRAM, URETEROSCOPY AND STENT PLACEMENT  04/04/2012   Procedure: CYSTOSCOPY WITH RETROGRADE PYELOGRAM, URETEROSCOPY AND STENT PLACEMENT;  Surgeon: Molli Hazard, MD;  Location: [facility];  Service: Urology;  Laterality: Right;  . JOINT REPLACEMENT Left    knee  . LEFT HEART  CATH AND CORONARY ANGIOGRAPHY N/A 03/14/2019   Procedure: LEFT HEART CATH AND CORONARY ANGIOGRAPHY;  Surgeon: Belva Crome, MD;  Location: [facility];  Service: Cardiovascular;  Laterality: N/A;  . LUMBAR LAMINECTOMY/DECOMPRESSION MICRODISCECTOMY N/A 09/27/2017   Procedure: LAMINECTOMY LUMBAR THREE- LUMBAR FOUR, LUMBAR FOUR- LUMBAR FIVE;  Surgeon: Ashok Pall, MD;  Location: [facility];  Service: Neurosurgery;  Laterality: N/A;  . ROTATOR CUFF REPAIR Left   . seed implants    . TEE WITHOUT CARDIOVERSION N/A 05/27/2019   Procedure: TRANSESOPHAGEAL ECHOCARDIOGRAM (TEE);  Surgeon: Grace Isaac, MD;  Location: [facility];  Service: Open Heart Surgery;  Laterality: N/A;  . TOTAL KNEE ARTHROPLASTY Left 08/06/2014   Procedure: LEFT TOTAL KNEE ARTHROPLASTY;  Surgeon: Susa Day, MD;  Location: [facility];  Service: Orthopedics;  Laterality: Left;       Family History  Problem Relation Age of Onset  . Cancer Mother   . Dementia Mother   . Colon cancer Mother   . Heart attack Sister   . Stroke Sister   . Heart attack Father   . Esophageal cancer Neg Hx   . Rectal cancer Neg Hx   . Stomach cancer Neg Hx     Social History   Tobacco Use  . Smoking status: Former Smoker    Quit date: 05/15/1973    Years since quitting: 46.9  . Smokeless tobacco: Never Used  Vaping Use  . Vaping Use: Never used  Substance Use Topics  . Alcohol use: Yes    Comment: "2 cocktails twice a week"  . Drug use: No    Home Medications Prior to Admission medications   Medication Sig Start Date End Date Taking? Authorizing Provider  allopurinol (ZYLOPRIM) 300 MG tablet Take 300 mg by mouth daily.    Leanna Battles, MD  aspirin EC 81 MG EC tablet Take 1 tablet (81 mg total) by mouth daily. 06/02/19   Elgie Collard, PA-C  atorvastatin (LIPITOR) 40 MG tablet Take 1 tablet (40 mg total) by mouth daily at 6 PM. 12/04/19   Burtis Junes, NP  ELIQUIS 5 MG TABS tablet TAKE 1 TABLET BY MOUTH  TWICE DAILY 11/04/19   Burtis Junes, NP  furosemide (LASIX) 20 MG tablet Take 1 tablet (20 mg total) by mouth daily as needed for fluid or edema. 02/04/20   Burtis Junes, NP  losartan (COZAAR) 100 MG tablet Take 100 mg by mouth daily. 09/19/19   [provider]  metFORMIN (GLUCOPHAGE) 1000 MG tablet Take 500 mg by mouth daily with breakfast.    [provider]  metoprolol tartrate (LOPRESSOR) 25 MG tablet Take 1 tablet (25 mg total) by mouth 2 (two) times daily. 12/04/19   Burtis Junes, NP  Multiple Vitamin (MULTIVITAMIN WITH MINERALS) TABS tablet Take 1 tablet by mouth daily. Centrum Silver    [provider]  Multiple Vitamins-Minerals (PRESERVISION AREDS 2) CAPS Take 1 capsule by mouth 2 (two) times daily.     [provider]  ondansetron (ZOFRAN ODT) 4 MG disintegrating tablet Take 1 tablet (4 mg total) by mouth every 8 (eight) hours as needed for nausea or vomiting. 05/01/20   Fransico Meadow, PA-C  oxyCODONE-acetaminophen (PERCOCET) 5-325 MG tablet Take 1 tablet by mouth every 4 (four) hours as needed for severe pain. 05/01/20 05/01/21  Fransico Meadow, PA-C  tamsulosin (FLOMAX) 0.4 MG CAPS capsule Take 1 capsule (0.4 mg total) by mouth daily. 05/01/20   Threasa Alpha,  Hollace Kinnier, PA-C    Allergies    Lisinopril, Sulfa drugs cross reactors, Contrast media [iodinated diagnostic agents], Doxycycline, Ioxaglate, and Tetracyclines & related  Review of Systems   Review of Systems  Gastrointestinal: Positive for abdominal pain.  Genitourinary: Positive for hematuria.  All other systems reviewed and are negative.   Physical Exam Updated Vital Signs BP (!) 184/110 (BP Location: Right Arm)   Pulse 75   Temp 98 F (36.7 C) (Oral)   Resp 16   Ht 6' (1.829 m)   Wt 98.4 kg   SpO2 95%   BMI 29.43 kg/m   Physical Exam Vitals reviewed.  Cardiovascular:     Rate and Rhythm: Normal rate.  Abdominal:     General: Abdomen is flat. Bowel sounds are normal.     Palpations: Abdomen is soft.  Skin:    General: Skin is warm.  Neurological:     General: No focal deficit present.  Psychiatric:        Mood and Affect: Mood normal.     ED Results / Procedures / Treatments   Labs (all labs ordered are listed, but only abnormal results are displayed) Labs Reviewed  COMPREHENSIVE METABOLIC PANEL - Abnormal; Notable for the following components:      Result Value   Glucose, Bld 128 (*)    BUN 26 (*)    Creatinine, Ser 1.36 (*)    GFR, Estimated 54 (*)    All other components within normal limits  CBC - Abnormal; Notable for the following components:   Platelets 147 (*)    All other  components within normal limits  URINALYSIS, ROUTINE W REFLEX MICROSCOPIC - Abnormal; Notable for the following components:   Color, Urine BROWN (*)    APPearance CLOUDY (*)    Specific Gravity, Urine >1.030 (*)    Hgb urine dipstick LARGE (*)    Bilirubin Urine SMALL (*)    Protein, ur 100 (*)    All other components within normal limits  URINALYSIS, MICROSCOPIC (REFLEX) - Abnormal; Notable for the following components:   Bacteria, UA MANY (*)    All other components within normal limits  CBG MONITORING, ED - Abnormal; Notable for the following components:   Glucose-Capillary 141 (*)    All other components within normal limits  LIPASE, BLOOD    EKG None  Radiology CT Renal Stone Study  Result Date: 05/01/2020 CLINICAL DATA:  Bilateral flank pain. EXAM: CT ABDOMEN AND PELVIS WITHOUT CONTRAST TECHNIQUE: Multidetector CT imaging of the abdomen and pelvis was performed following the standard protocol without IV contrast. COMPARISON:  CT dated 01/28/2020. FINDINGS: Lower chest: There is atelectasis at the lung bases.The heart is enlarged. Hepatobiliary: There is decreased hepatic attenuation suggestive of hepatic steatosis. Cholelithiasis without acute inflammation.There is no biliary ductal dilation. Pancreas: Normal contours without ductal dilatation. No peripancreatic fluid collection. Spleen: The spleen is enlarged measuring approximately 15 cm craniocaudad. Adrenals/Urinary Tract: --Adrenal glands: Unremarkable. --Right kidney/ureter: There is mild right-sided hydroureteronephrosis to the level of the urinary bladder. This appears to be secondary to an obstructing set of stones measuring 1 mm and 2 mm in the distal right ureter. There are nonobstructing stones in the lower pole the right kidney. --Left kidney/ureter: Multiple left-sided renal cysts are again noted. --Urinary bladder: There is mild bladder wall thickening. Stomach/Bowel: --Stomach/Duodenum: No hiatal hernia or other gastric  abnormality. Normal duodenal course and caliber. --Small bowel: Unremarkable. --Colon: Rectosigmoid diverticulosis without acute inflammation. --Appendix: Normal. Vascular/Lymphatic: Atherosclerotic calcification is  present within the non-aneurysmal abdominal aorta, without hemodynamically significant stenosis. --No retroperitoneal lymphadenopathy. --No mesenteric lymphadenopathy. --No pelvic or inguinal lymphadenopathy. Reproductive: Multiple brachytherapy beads are noted in the prostate gland. Other: No ascites or free air. Again noted is a soft tissue mass in the anterior abdominal wall containing several small calcifications. This mass measures up to approximately 2.3 cm in diameter (previously measuring 2.1 cm when remeasured). Musculoskeletal. No acute displaced fractures. Stable small sclerotic foci are noted in the pelvic bones. IMPRESSION: 1. There is mild right-sided hydroureteronephrosis to the level of the urinary bladder. This appears to be secondary to an obstructing set of stones measuring 1 mm and 2 mm in the distal right ureter. 2. There are nonobstructing stones in the lower pole the right kidney. 3. There is mild bladder wall thickening. Correlation with urinalysis is recommended. 4. Slight interval growth of a soft tissue mass in the anterior abdominal wall. Follow-up with a nonemergent outpatient ultrasound-guided biopsy is recommended. 5. Hepatic steatosis. 6. Splenomegaly. 7. Cardiomegaly. Aortic Atherosclerosis (ICD10-I70.0). Electronically Signed   By: Constance Holster M.D.   On: 05/01/2020 16:14    Procedures Procedures (including critical care time)  Medications Ordered in ED Medications  HYDROmorphone (DILAUDID) injection 1 mg (1 mg Intravenous Given 05/01/20 1537)  ondansetron (ZOFRAN) injection 4 mg (4 mg Intravenous Given 05/01/20 1535)    ED Course  I have reviewed the triage vital signs and the nursing notes.  Pertinent labs & imaging results that were available  during my care of the patient were reviewed by me and considered in my medical decision making (see chart for details).    MDM Rules/Calculators/A&P                          MDM:  Pt has scheduled surgery for abdominal mass.  Ct scan shows a 57mm and a 46mm stone in the distal ureter. Pt reports improved pain with dilaudid.  Pt advised to see Urologist for recheck  Final Clinical Impression(s) / ED Diagnoses Final diagnoses:  Right kidney stone    Rx / DC Orders ED Discharge Orders         Ordered    oxyCODONE-acetaminophen (PERCOCET) 5-325 MG tablet  Every 4 hours PRN        05/01/20 1720    ondansetron (ZOFRAN ODT) 4 MG disintegrating tablet  Every 8 hours PRN        05/01/20 1720    tamsulosin (FLOMAX) 0.4 MG CAPS capsule  Daily        05/01/20 1720        An After Visit Summary was printed and given to the patient.   Sidney Ace 05/01/20 1839    Merrily Pew, MD 05/03/20 [phone]

## 2020-05-01 NOTE — ED Triage Notes (Signed)
Hematuria and abd pain x 3 days.

## 2020-05-01 NOTE — ED Notes (Signed)
Pt discharged to home. Discharge instructions have been discussed with patient and/or family members. Pt verbally acknowledges understanding d/c instructions, and endorses comprehension to checkout at registration before leaving.  °

## 2020-05-03 ENCOUNTER — Other Ambulatory Visit (HOSPITAL_COMMUNITY)
Admission: RE | Admit: 2020-05-03 | Discharge: 2020-05-03 | Disposition: A | Discharge status: Home / Self Care | Payer: Medicare Other | Source: Ambulatory Visit | Attending: Surgery | Admitting: Surgery

## 2020-05-03 DIAGNOSIS — Z01812 Encounter for preprocedural laboratory examination: Secondary | ICD-10-CM | POA: Diagnosis present

## 2020-05-03 DIAGNOSIS — Z20822 Contact with and (suspected) exposure to covid-19: Secondary | ICD-10-CM | POA: Diagnosis not present

## 2020-05-03 LAB — SARS CORONAVIRUS 2 (TAT 6-24 HRS): SARS Coronavirus 2: NEGATIVE

## 2020-05-04 ENCOUNTER — Encounter (HOSPITAL_BASED_OUTPATIENT_CLINIC_OR_DEPARTMENT_OTHER): Payer: Self-pay | Admitting: Physician Assistant

## 2020-05-04 ENCOUNTER — Encounter (HOSPITAL_BASED_OUTPATIENT_CLINIC_OR_DEPARTMENT_OTHER): Payer: Self-pay | Admitting: Surgery

## 2020-05-04 ENCOUNTER — Other Ambulatory Visit: Payer: Self-pay

## 2020-05-04 NOTE — Progress Notes (Addendum)
Spoke w/ via phone for pre-op interview--- PT Lab needs dos----  no             Lab results------ current ekg in epic/ chart;  Pt had CBC/ BMP done 05-01-2020 results in epic/ chart COVID test ------ done 05-01-2020 negative results in epic Arrive at ------- 1115 NPO after MN NO Solid Food.  Clear liquids from MN until--- 1015 Medications to take morning of surgery ----- Lopressor, Allopurinol Diabetic medication ----- do not take metformin morning of surgery Patient Special Instructions ----- n/a Pre-Op special Istructions ----- pt has telephone cardiac clearance by Truitt Merle PA dated 04-19-2020 in epic/ chart Patient verbalized understanding of instructions that were given at this phone interview. Patient denies shortness of breath, chest pain, fever, cough at this phone interview.   Anesthesia Review:  Persistant Afib, on eliquis/ asa;  HTN;  CAD s/p CABG x3 01/ 2021;  Thymoma;  DM2;  OSA uses cpap nightly.  Pt denies cardiac S&S, no peripheral swelling, but does get sob w/ flight stairs (recovers quickly).  Pt had recent ED visit 05-01-2020 in epic, right ureter and renal stones.  Pt stated passed two stones 05-03-2020. Does have follow up w/ urology 05-21-2019.  PCP:  Dr. Threasa Beards Cardiologist : Truitt Merle PA (lov 02-04-2020 epic) CVTS:  Dr Servando Snare (lov 02-26-2020 epic) Chest x-ray : 02-23-2020 epic EKG : 06-25-2019 epic Echo : 07-17-2019 epic Stress test:  no Cardiac Cath :  03-14-2019 epic Activity level:  Doe w/ stairs Sleep Study/ CPAP :  YES/YES Fasting Blood Sugar :   98--120   / Checks Blood Sugar -- times a day:  No every day per pt Blood Thinner/ Instructions Maryjane Hurter Dose: Eliquis ASA / Instructions/ Last Dose : ASA 81mg  Pt has cardiac clearance to stop eliquis 48 hours prior to surgery and to continue asa.  Pt stated was given instructions by dr white office. Pt stated last dose epiluis was today am dose @ 0600.

## 2020-05-05 ENCOUNTER — Encounter (HOSPITAL_BASED_OUTPATIENT_CLINIC_OR_DEPARTMENT_OTHER)
Admission: RE | Disposition: A | Discharge status: Home / Self Care | Payer: Self-pay | Source: Home / Self Care | Attending: Surgery

## 2020-05-05 ENCOUNTER — Ambulatory Visit: Payer: Self-pay | Admitting: Surgery

## 2020-05-05 ENCOUNTER — Ambulatory Visit (HOSPITAL_BASED_OUTPATIENT_CLINIC_OR_DEPARTMENT_OTHER)
Admission: RE | Admit: 2020-05-05 | Discharge: 2020-05-05 | Disposition: A | Discharge status: Home / Self Care | Payer: Medicare Other | Attending: Surgery | Admitting: Surgery

## 2020-05-05 HISTORY — DX: Other difficulties with micturition: R39.198

## 2020-05-05 HISTORY — DX: Presence of external hearing-aid: Z97.4

## 2020-05-05 HISTORY — DX: Type 2 diabetes mellitus without complications: E11.9

## 2020-05-05 HISTORY — DX: Other forms of dyspnea: R06.09

## 2020-05-05 HISTORY — DX: Dyspnea, unspecified: R06.00

## 2020-05-05 HISTORY — DX: Other persistent atrial fibrillation: I48.19

## 2020-05-05 HISTORY — DX: Atherosclerotic heart disease of native coronary artery without angina pectoris: I25.10

## 2020-05-05 HISTORY — DX: Feeling of incomplete bladder emptying: R39.14

## 2020-05-05 HISTORY — DX: Long term (current) use of anticoagulants: Z79.01

## 2020-05-05 HISTORY — DX: Incisional hernia without obstruction or gangrene: K43.2

## 2020-05-05 HISTORY — DX: Calculus of kidney: N20.0

## 2020-05-05 HISTORY — DX: Presence of dental prosthetic device (complete) (partial): K08.109

## 2020-05-05 HISTORY — DX: Personal history of other diseases of urinary system: Z87.448

## 2020-05-05 SURGERY — EXCISION, MASS, TORSO
Anesthesia: Monitor Anesthesia Care

## 2020-05-05 SURGICAL SUPPLY — 40 items
BLADE CLIPPER SENSICLIP SURGIC (BLADE) IMPLANT
BLADE HEX COATED 2.75 (ELECTRODE) IMPLANT
BLADE SURG 15 STRL LF DISP TIS (BLADE) IMPLANT
BLADE SURG 15 STRL SS (BLADE)
BNDG GAUZE ELAST 4 BULKY (GAUZE/BANDAGES/DRESSINGS) IMPLANT
CANISTER SUCT 3000ML PPV (MISCELLANEOUS) IMPLANT
COVER BACK TABLE 60X90IN (DRAPES) IMPLANT
COVER MAYO STAND STRL (DRAPES) IMPLANT
COVER WAND RF STERILE (DRAPES) IMPLANT
DERMABOND ADVANCED (GAUZE/BANDAGES/DRESSINGS)
DERMABOND ADVANCED .7 DNX12 (GAUZE/BANDAGES/DRESSINGS) IMPLANT
DRAPE LAPAROTOMY 100X72 PEDS (DRAPES) IMPLANT
DRAPE UTILITY XL STRL (DRAPES) IMPLANT
ELECT REM PT RETURN 9FT ADLT (ELECTROSURGICAL)
ELECTRODE REM PT RTRN 9FT ADLT (ELECTROSURGICAL) IMPLANT
GAUZE SPONGE 4X4 12PLY STRL (GAUZE/BANDAGES/DRESSINGS) IMPLANT
GLOVE BIO SURGEON STRL SZ7.5 (GLOVE) IMPLANT
GLOVE INDICATOR 8.0 STRL GRN (GLOVE) IMPLANT
GOWN STRL REUS W/ TWL LRG LVL3 (GOWN DISPOSABLE) IMPLANT
GOWN STRL REUS W/ TWL XL LVL3 (GOWN DISPOSABLE) IMPLANT
GOWN STRL REUS W/TWL LRG LVL3 (GOWN DISPOSABLE)
GOWN STRL REUS W/TWL XL LVL3 (GOWN DISPOSABLE)
KIT TURNOVER CYSTO (KITS) IMPLANT
NEEDLE HYPO 22GX1.5 SAFETY (NEEDLE) IMPLANT
NEEDLE HYPO 25X1 1.5 SAFETY (NEEDLE) IMPLANT
NS IRRIG 1000ML POUR BTL (IV SOLUTION) IMPLANT
PACK BASIN DAY SURGERY FS (CUSTOM PROCEDURE TRAY) IMPLANT
PENCIL SMOKE EVACUATOR (MISCELLANEOUS) IMPLANT
SPONGE LAP 18X18 RF (DISPOSABLE) IMPLANT
SPONGE LAP 4X18 RFD (DISPOSABLE) IMPLANT
SUT ETHILON 3 0 FSL (SUTURE) IMPLANT
SUT MNCRL AB 4-0 PS2 18 (SUTURE) IMPLANT
SUT VIC AB 3-0 SH 27 (SUTURE)
SUT VIC AB 3-0 SH 27XBRD (SUTURE) IMPLANT
SYR BULB EAR ULCER 3OZ GRN STR (SYRINGE) IMPLANT
SYR CONTROL 10ML LL (SYRINGE) IMPLANT
TOWEL OR 17X26 10 PK STRL BLUE (TOWEL DISPOSABLE) IMPLANT
TRAY DSU PREP LF (CUSTOM PROCEDURE TRAY) IMPLANT
UNDERPAD 30X36 HEAVY ABSORB (UNDERPADS AND DIAPERS) IMPLANT
YANKAUER SUCT BULB TIP NO VENT (SUCTIONS) ×2 IMPLANT

## 2020-05-05 NOTE — H&P (Signed)
CC: Referred by Truitt Merle NP for newly identified subQ nodule left and superior to umbilicus  HPI: Mr. [name] is a very pleasant [phone] with hx of afib (on Eliquis), CAD (s/p CABG), HTN, HLD, DM, prostate ca and thymoma found incidentally at CABG whom is referred to [place] for evaluation of recently identified subcutaneous mass in abdomen - just to the left and cephalad to umbilicus. Reports having first noticed this a few months ago - having been more active and noticing this more recently. Unclear if it is actually burns as refused had some weight loss that has made it more apparent. He denies any pain related to this. He denies any skin changes. He denies any known trauma to this location of his abdomen in the past. He denies any bruising at this location before. He also has a soft reducible incisional hernia at the base of his CABG incision that contained some fat. This has not been bothersome to him whatsoever.  PMH: afib (on Eliquis), CAD (s/p CABG), HTN, HLD, DM, prostate ca and thymoma  PSH: CABG; denies any prior abdominal surgeries or procedures  FHx: Denies FHx of colorectal, breast, endometrial, ovarian or cervical cancer  Social: Denies use of tobacco/EtOH/drugs. He is here today with his wife.  ROS: A comprehensive 10 system review of systems was completed with the patient and pertinent findings as noted above.  The patient is a 74 year old male.   Past Surgical History Darden Palmer, [place]; 04/12/2020 9:18 AM) Bypass Surgery for Poor Blood Flow to Legs  Cataract Surgery  Bilateral. Coronary Artery Bypass Graft  Knee Surgery  Left. Shoulder Surgery  Left. Spinal Surgery - Lower Back   Diagnostic Studies History Darden Palmer, [place]; 04/12/2020 9:18 AM) Colonoscopy  5-10 years ago  Allergies Darden Palmer, RMA; 04/12/2020 9:22 AM) Lisinopril *CHEMICALS*  Cough. Sulfacetamide *CHEMICALS*  Contrast Allergy PreMed Pack *CORTICOSTEROIDS*  Doxycycline Hyclate  *TETRACYCLINES*  Itching. Tetracycline *CHEMICALS*  Rash.  Medication History Darden Palmer, [place]; 04/12/2020 9:24 AM) Furosemide (20MG  Tablet, Oral) Active. Atorvastatin Calcium (40MG  Tablet, Oral) Active. Metoprolol Tartrate (25MG  Tablet, Oral) Active. Eliquis (5MG  Tablet, Oral) Active. Losartan Potassium (100MG  Tablet, Oral) Active. Allopurinol (300MG  Tablet, Oral) Active. metFORMIN HCl (1000MG  Tablet, Oral) Active. Isosorbide Mononitrate ER (60MG  Tablet ER 24HR, Oral) Active. Diclofenac Sodium (1% Gel, External) Active. Aspirin (81MG  Tablet DR, Oral) Active. Atenolol-Chlorthalidone (50-25MG  Tablet, Oral) Active.  Social History Darden Palmer, [place]; 04/12/2020 9:18 AM) Alcohol use  Moderate alcohol use. Caffeine use  Carbonated beverages, Tea. Illicit drug use  Remotely quit drug use. Tobacco use  Former smoker.  Family History Darden Palmer, [place]; 04/12/2020 9:18 AM) Alcohol Abuse  Son. Arthritis  Mother. Cerebrovascular Accident  Father. Colon Cancer  Mother. Heart Disease  Father. Heart disease in male family member before age 105  Hypertension  Father.  Other Problems Darden Palmer, [place]; 04/12/2020 9:18 AM) Alcohol Abuse  Arthritis  Atrial Fibrillation  Back Pain  Diabetes Mellitus  High blood pressure  Sleep Apnea  Umbilical Hernia Repair     Review of Systems Darden Palmer RMA; 04/12/2020 9:18 AM) General Not Present- Appetite Loss, Chills, Fatigue, Fever, Night Sweats, Weight Gain and Weight Loss. Skin Not Present- Change in Wart/Mole, Dryness, Hives, Jaundice, New Lesions, Non-Healing Wounds, Rash and Ulcer. HEENT Present- Hearing Loss, Visual Disturbances and Wears glasses/contact lenses. Not Present- Earache, Hoarseness, Nose Bleed, Oral Ulcers, Ringing in the Ears, Seasonal Allergies, Sinus Pain, Sore Throat and Yellow Eyes. Respiratory Present- Difficulty Breathing and Snoring. Not Present- Bloody  sputum, Chronic Cough and  Wheezing. Breast Not Present- Breast Mass, Breast Pain, Nipple Discharge and Skin Changes. Cardiovascular Present- Leg Cramps and Shortness of Breath. Not Present- Chest Pain, Difficulty Breathing Lying Down, Palpitations, Rapid Heart Rate and Swelling of Extremities. Gastrointestinal Present- Constipation. Not Present- Abdominal Pain, Bloating, Bloody Stool, Change in Bowel Habits, Chronic diarrhea, Difficulty Swallowing, Excessive gas, Gets full quickly at meals, Hemorrhoids, Indigestion, Nausea, Rectal Pain and Vomiting. Male Genitourinary Not Present- Frequency, Nocturia, Painful Urination, Pelvic Pain and Urgency. Musculoskeletal Present- Back Pain, Joint Pain, Joint Stiffness and Muscle Pain. Not Present- Muscle Weakness and Swelling of Extremities. Neurological Present- Tingling. Not Present- Decreased Memory, Fainting, Headaches, Numbness, Seizures, Tremor, Trouble walking and Weakness. Psychiatric Not Present- Anxiety, Bipolar, Change in Sleep Pattern, Depression, Fearful and Frequent crying. Endocrine Not Present- Cold Intolerance, Excessive Hunger, Hair Changes, Heat Intolerance, Hot flashes and New Diabetes. Hematology Present- Blood Thinners. Not Present- Easy Bruising, Excessive bleeding, Gland problems, HIV and Persistent Infections.  Vitals Lattie Haw [place] RMA; 04/12/2020 9:24 AM) 04/12/2020 9:24 AM Weight: 225.38 lb Height: 70in Body Surface Area: 2.2 m Body Mass Index: 32.34 kg/m  Temp.: 97.29F  Pulse: 69 (Regular)  P.OX: 96% (Room air) BP: 136/88(Sitting, Left Arm, Standard)       Physical Exam Harrell Gave M. Stanley Lyness MD; 04/12/2020 9:46 AM) The physical exam findings are as follows: Note: Constitutional: No acute distress; conversant; no deformities Eyes: Moist conjunctiva; no lid lag; anicteric sclerae; pupils equal and round Neck: Trachea midline; no palpable thyromegaly Lungs: Normal respiratory effort; no tactile fremitus CV: irregular rate/rhythm;  no palpable thrill; no pitting edema GI: Abdomen soft, nontender, nondistended; no palpable hepatosplenomegaly. +Subcutaneous mass just left and cephalad umbilicus - soft/mobile. No skin changes. +Incisional hernia at base of CABG where rectus was incised. Soft, reducible, painless, no skin changes. MSK: Normal gait; no clubbing/cyanosis Psychiatric: Appropriate affect; alert and oriented 3 Lymphatic: No palpable cervical or axillary lymphadenopathy    Assessment & Plan Harrell Gave M. Kagen Kunath MD; 04/12/2020 9:51 AM) SOFT TISSUE MASS (M79.89) Story: Mr. [name] is a very pleasant [phone] with hx afib (on Eliquis), CAD (s/p CABG), HTN, HLD, DM, prostate ca and thymoma - here for evaluation of soft tissue mass, subcutaneous, 2x2 cm, left/cephalad to umbilicus on abdomen CT AP + MRI Abd shows ~2 x 2 cm soft tissue nodule, partially calcified - unclear what this represents. Is new since 2015. Impression: -We spent time reviewing everything today. We discussed options going forward. He is interested in having this removed for peace of mind reasons and I agree this makes the most sense. We discussed excision of soft tissue mass from the abdominal subcutaneous tissue. -The anatomy and physiology of the abdominal wall was discussed as was pathophysiology of these lesions. -The planned procedure, material risks (including, but not limited to, pain, bleeding, infection, scarring, hematoma, seroma, need for additional procedures, recurrence, chronic pain, pneumonia, heart attack, stroke, death) benefits and alternatives to surgery were discussed at length. The patient's questions were answered to his satisfaction, he voiced understanding and elected to proceed with surgery. Additionally, we discussed typical postoperative expectations and the recovery process. -Requesting cardiac clearance and to hold Eliquis perioperatively - Truitt Merle  This patient encounter took 32 minutes today to perform the following:  take history, perform exam, review outside records, interpret imaging, counsel the patient on their diagnosis and document encounter, findings & plan in the EHR INCISIONAL HERNIA (K43.2) Impression: -Epigastric, inferior extension of CABG. ~Asymptomatic. Contains fat on CT. Discussed monitoring closely for  development of symptoms. Given location to xiphoid, would likely be a tissue repair if it came to surgery.  Signed by Ileana Roup, MD (04/12/2020 9:51 AM)

## 2020-05-15 DIAGNOSIS — Z87442 Personal history of urinary calculi: Secondary | ICD-10-CM

## 2020-05-15 HISTORY — DX: Personal history of urinary calculi: Z87.442

## 2020-05-17 ENCOUNTER — Other Ambulatory Visit (HOSPITAL_COMMUNITY)
Admission: RE | Admit: 2020-05-17 | Discharge: 2020-05-17 | Disposition: A | Discharge status: Home / Self Care | Payer: Medicare Other | Source: Ambulatory Visit | Attending: Surgery | Admitting: Surgery

## 2020-05-17 ENCOUNTER — Other Ambulatory Visit: Payer: Self-pay

## 2020-05-17 DIAGNOSIS — Z20822 Contact with and (suspected) exposure to covid-19: Secondary | ICD-10-CM | POA: Diagnosis not present

## 2020-05-17 DIAGNOSIS — Z01818 Encounter for other preprocedural examination: Secondary | ICD-10-CM | POA: Diagnosis present

## 2020-05-17 LAB — SARS CORONAVIRUS 2 (TAT 6-24 HRS): SARS Coronavirus 2: NEGATIVE

## 2020-05-17 NOTE — Progress Notes (Signed)
Spoke w/ via phone for pre-op interview--- PT (pt rescheduled from 05-05-2020 due to he had taken his eliquis) Lab needs dos----  Istat (per anes)/  Pre-op orders pending.             Lab results------ current ekg in epic COVID test ------ done 05-17-2020 result in epic Arrive at ------- 1000 on 05-19-2020 NPO after MN NO Solid Food.  Clear liquids from MN until--- 0900 Medications to take morning of surgery -----  Lopressor, Allopurinol Diabetic medication ----- do not take metformin morning of surgery Patient Special Instructions ----- n/a Pre-Op special Istructions ----- pt has telephone cardiac clearance by Norma Fredrickson PA dated 04-19-2020 in epic/ chart Patient verbalized understanding of instructions that were given at this phone interview. Patient denies shortness of breath, chest pain, fever, cough at this phone interview.   Anesthesia Review:  Persistant Afib, on eliquis/ asa;  HTN;  CAD s/p CABG x3 01/ 2021;  Thymoma;  DM2;  OSA uses cpap nightly.  Pt denies cardiac S&S, no peripheral swelling, but does get sob w/ flight stairs (recovers quickly).  Pt had recent ED visit 05-01-2020 in epic, right ureter and renal stones.  Pt stated passed two stones 05-03-2020. Does have follow up w/ urology 05-21-2019.  PCP:  Dr. Donavan Burnet Cardiologist : Norma Fredrickson PA (lov 02-04-2020 epic) CVTS:  Dr Tyrone Sage (lov 02-26-2020 epic) Chest x-ray : 02-23-2020 epic EKG : 06-25-2019 epic Echo : 07-17-2019 epic Stress test:  no Cardiac Cath :  03-14-2019 epic Activity level:  Doe w/ stairs Sleep Study/ CPAP :  YES/YES Fasting Blood Sugar :   98--120   / Checks Blood Sugar -- times a day:  No every day per pt Blood Thinner/ Instructions Maurice Small Dose: Eliquis ASA / Instructions/ Last Dose : ASA 81mg  Pt has cardiac clearance to stop eliquis 48 hours prior to surgery and to continue asa.  Pt stated was given instructions by dr white office. Pt stated last dose 05-17-2020 @ 0600

## 2020-05-18 NOTE — Progress Notes (Signed)
Left voice mail for Dr. Cliffton Asters to put orders in on his patient for tomorrow

## 2020-05-18 NOTE — Anesthesia Preprocedure Evaluation (Addendum)
Anesthesia Evaluation  Patient identified by MRN, date of birth, ID band Patient awake    Reviewed: Allergy & Precautions, NPO status , Patient's Chart, lab work & pertinent test results, reviewed documented beta blocker date and time   History of Anesthesia Complications Negative for: history of anesthetic complications  Airway Mallampati: II  TM Distance: >3 FB Neck ROM: Full    Dental  (+) Edentulous Upper, Edentulous Lower   Pulmonary sleep apnea and Continuous Positive Airway Pressure Ventilation , former smoker,    Pulmonary exam normal        Cardiovascular hypertension, Pt. on medications and Pt. on home beta blockers + CAD and + CABG (05/2019)  Normal cardiovascular exam+ dysrhythmias (on Eliquis) Atrial Fibrillation   TTE 07/2019: EF 55-60%, mild LVH, interventricular  septum flattened in systole and diastole, consistent with RV pressure and volume overload, severe LAE/RAE, mild to moderate MR, moderate TR, mildly elevated PASP, RVSP 35.2 mmHg   Neuro/Psych Depression negative neurological ROS     GI/Hepatic negative GI ROS, Neg liver ROS,   Endo/Other  diabetes, Type 2, Oral Hypoglycemic Agents  Renal/GU negative Renal ROS  negative genitourinary   Musculoskeletal  (+) Arthritis ,   Abdominal   Peds  Hematology negative hematology ROS (+)   Anesthesia Other Findings Day of surgery medications reviewed with patient.  Reproductive/Obstetrics negative OB ROS                           Anesthesia Physical Anesthesia Plan  ASA: III  Anesthesia Plan: MAC   Post-op Pain Management:    Induction:   PONV Risk Score and Plan: 1 and Treatment may vary due to age or medical condition, Propofol infusion and Ondansetron  Airway Management Planned: Natural Airway and Nasal Cannula  Additional Equipment: None  Intra-op Plan:   Post-operative Plan:   Informed Consent: I have reviewed  the patients History and Physical, chart, labs and discussed the procedure including the risks, benefits and alternatives for the proposed anesthesia with the patient or authorized representative who has indicated his/her understanding and acceptance.       Plan Discussed with: CRNA  Anesthesia Plan Comments:        Anesthesia Quick Evaluation

## 2020-05-19 ENCOUNTER — Encounter (HOSPITAL_BASED_OUTPATIENT_CLINIC_OR_DEPARTMENT_OTHER)
Admission: RE | Disposition: A | Discharge status: Home / Self Care | Payer: Self-pay | Source: Home / Self Care | Attending: Surgery

## 2020-05-19 ENCOUNTER — Ambulatory Visit (HOSPITAL_BASED_OUTPATIENT_CLINIC_OR_DEPARTMENT_OTHER): Payer: Medicare Other | Admitting: Anesthesiology

## 2020-05-19 ENCOUNTER — Other Ambulatory Visit: Payer: Self-pay

## 2020-05-19 ENCOUNTER — Encounter (HOSPITAL_BASED_OUTPATIENT_CLINIC_OR_DEPARTMENT_OTHER): Payer: Self-pay | Admitting: Surgery

## 2020-05-19 ENCOUNTER — Ambulatory Visit (HOSPITAL_BASED_OUTPATIENT_CLINIC_OR_DEPARTMENT_OTHER)
Admission: RE | Admit: 2020-05-19 | Discharge: 2020-05-19 | Disposition: A | Discharge status: Home / Self Care | Payer: Medicare Other | Attending: Surgery | Admitting: Surgery

## 2020-05-19 DIAGNOSIS — Z96652 Presence of left artificial knee joint: Secondary | ICD-10-CM | POA: Diagnosis not present

## 2020-05-19 DIAGNOSIS — Z8546 Personal history of malignant neoplasm of prostate: Secondary | ICD-10-CM | POA: Insufficient documentation

## 2020-05-19 DIAGNOSIS — Z91041 Radiographic dye allergy status: Secondary | ICD-10-CM | POA: Insufficient documentation

## 2020-05-19 DIAGNOSIS — Z87891 Personal history of nicotine dependence: Secondary | ICD-10-CM | POA: Insufficient documentation

## 2020-05-19 DIAGNOSIS — I10 Essential (primary) hypertension: Secondary | ICD-10-CM | POA: Insufficient documentation

## 2020-05-19 DIAGNOSIS — I4891 Unspecified atrial fibrillation: Secondary | ICD-10-CM | POA: Insufficient documentation

## 2020-05-19 DIAGNOSIS — E119 Type 2 diabetes mellitus without complications: Secondary | ICD-10-CM | POA: Insufficient documentation

## 2020-05-19 DIAGNOSIS — Z85238 Personal history of other malignant neoplasm of thymus: Secondary | ICD-10-CM | POA: Insufficient documentation

## 2020-05-19 DIAGNOSIS — Z7901 Long term (current) use of anticoagulants: Secondary | ICD-10-CM | POA: Insufficient documentation

## 2020-05-19 DIAGNOSIS — Z888 Allergy status to other drugs, medicaments and biological substances status: Secondary | ICD-10-CM | POA: Insufficient documentation

## 2020-05-19 DIAGNOSIS — Z881 Allergy status to other antibiotic agents status: Secondary | ICD-10-CM | POA: Insufficient documentation

## 2020-05-19 DIAGNOSIS — R897 Abnormal histological findings in specimens from other organs, systems and tissues: Secondary | ICD-10-CM | POA: Insufficient documentation

## 2020-05-19 DIAGNOSIS — I2510 Atherosclerotic heart disease of native coronary artery without angina pectoris: Secondary | ICD-10-CM | POA: Insufficient documentation

## 2020-05-19 DIAGNOSIS — Z951 Presence of aortocoronary bypass graft: Secondary | ICD-10-CM | POA: Insufficient documentation

## 2020-05-19 DIAGNOSIS — E785 Hyperlipidemia, unspecified: Secondary | ICD-10-CM | POA: Diagnosis not present

## 2020-05-19 DIAGNOSIS — Z882 Allergy status to sulfonamides status: Secondary | ICD-10-CM | POA: Diagnosis not present

## 2020-05-19 DIAGNOSIS — K654 Sclerosing mesenteritis: Secondary | ICD-10-CM | POA: Diagnosis not present

## 2020-05-19 DIAGNOSIS — R1900 Intra-abdominal and pelvic swelling, mass and lump, unspecified site: Secondary | ICD-10-CM | POA: Insufficient documentation

## 2020-05-19 HISTORY — PX: EXCISION MASS ABDOMINAL: SHX6701

## 2020-05-19 LAB — POCT I-STAT, CHEM 8
BUN: 22 mg/dL (ref 8–23)
Calcium, Ion: 1.26 mmol/L (ref 1.15–1.40)
Chloride: 105 mmol/L (ref 98–111)
Creatinine, Ser: 1.1 mg/dL (ref 0.61–1.24)
Glucose, Bld: 107 mg/dL — ABNORMAL HIGH (ref 70–99)
HCT: 43 % (ref 39.0–52.0)
Hemoglobin: 14.6 g/dL (ref 13.0–17.0)
Potassium: 4.1 mmol/L (ref 3.5–5.1)
Sodium: 142 mmol/L (ref 135–145)
TCO2: 23 mmol/L (ref 22–32)

## 2020-05-19 LAB — GLUCOSE, CAPILLARY: Glucose-Capillary: 101 mg/dL — ABNORMAL HIGH (ref 70–99)

## 2020-05-19 SURGERY — EXCISION, MASS, TORSO
Anesthesia: Monitor Anesthesia Care | Site: Abdomen

## 2020-05-19 MED ORDER — ONDANSETRON HCL 4 MG/2ML IJ SOLN
INTRAMUSCULAR | Status: DC | PRN
  Administered 2020-05-19: 4 mg via INTRAVENOUS

## 2020-05-19 MED ORDER — LACTATED RINGERS IV SOLN: INTRAVENOUS | Status: DC

## 2020-05-19 MED ORDER — CEFAZOLIN SODIUM-DEXTROSE 2-4 GM/100ML-% IV SOLN
INTRAVENOUS | Status: AC
  Filled 2020-05-19: qty 100

## 2020-05-19 MED ORDER — FENTANYL CITRATE (PF) 100 MCG/2ML IJ SOLN
INTRAMUSCULAR | Status: DC | PRN
  Administered 2020-05-19: 50 ug via INTRAVENOUS

## 2020-05-19 MED ORDER — TRAMADOL HCL 50 MG PO TABS: 50.0000 mg | ORAL_TABLET | Freq: Four times a day (QID) | ORAL | 0 refills | Status: AC | PRN

## 2020-05-19 MED ORDER — ACETAMINOPHEN 500 MG PO TABS
ORAL_TABLET | ORAL | Status: AC
  Filled 2020-05-19: qty 2

## 2020-05-19 MED ORDER — FENTANYL CITRATE (PF) 100 MCG/2ML IJ SOLN: 25.0000 ug | INTRAMUSCULAR | Status: DC | PRN

## 2020-05-19 MED ORDER — CEFAZOLIN SODIUM-DEXTROSE 2-4 GM/100ML-% IV SOLN
2.0000 g | INTRAVENOUS | Status: AC
  Administered 2020-05-19: 2 g via INTRAVENOUS

## 2020-05-19 MED ORDER — ONDANSETRON HCL 4 MG/2ML IJ SOLN
INTRAMUSCULAR | Status: AC
  Filled 2020-05-19: qty 2

## 2020-05-19 MED ORDER — PROPOFOL 500 MG/50ML IV EMUL
INTRAVENOUS | Status: DC | PRN
  Administered 2020-05-19: 30 ug/kg/min via INTRAVENOUS

## 2020-05-19 MED ORDER — PROMETHAZINE HCL 25 MG/ML IJ SOLN: 6.2500 mg | INTRAMUSCULAR | Status: DC | PRN

## 2020-05-19 MED ORDER — PROPOFOL 10 MG/ML IV BOLUS
INTRAVENOUS | Status: DC | PRN
  Administered 2020-05-19: 20 mg via INTRAVENOUS
  Administered 2020-05-19: 25 mg via INTRAVENOUS

## 2020-05-19 MED ORDER — OXYCODONE HCL 5 MG/5ML PO SOLN: 5.0000 mg | Freq: Once | ORAL | Status: DC | PRN

## 2020-05-19 MED ORDER — CHLORHEXIDINE GLUCONATE CLOTH 2 % EX PADS: 6.0000 | MEDICATED_PAD | Freq: Once | CUTANEOUS | Status: DC

## 2020-05-19 MED ORDER — OXYCODONE HCL 5 MG PO TABS: 5.0000 mg | ORAL_TABLET | Freq: Once | ORAL | Status: DC | PRN

## 2020-05-19 MED ORDER — BUPIVACAINE-EPINEPHRINE (PF) 0.25% -1:200000 IJ SOLN
INTRAMUSCULAR | Status: DC | PRN
  Administered 2020-05-19 (×3): 10 mL

## 2020-05-19 MED ORDER — LIDOCAINE 2% (20 MG/ML) 5 ML SYRINGE
INTRAMUSCULAR | Status: DC | PRN
  Administered 2020-05-19: 50 mg via INTRAVENOUS

## 2020-05-19 MED ORDER — ACETAMINOPHEN 500 MG PO TABS
1000.0000 mg | ORAL_TABLET | ORAL | Status: AC
  Administered 2020-05-19: 1000 mg via ORAL

## 2020-05-19 MED ORDER — FENTANYL CITRATE (PF) 100 MCG/2ML IJ SOLN
INTRAMUSCULAR | Status: AC
  Filled 2020-05-19: qty 2

## 2020-05-19 MED ORDER — 0.9 % SODIUM CHLORIDE (POUR BTL) OPTIME
TOPICAL | Status: DC | PRN
  Administered 2020-05-19: 500 mL

## 2020-05-19 SURGICAL SUPPLY — 40 items
ADH SKN CLS APL DERMABOND .7 (GAUZE/BANDAGES/DRESSINGS) ×2
BLADE CLIPPER SENSICLIP SURGIC (BLADE) IMPLANT
BLADE HEX COATED 2.75 (ELECTRODE) ×2 IMPLANT
BLADE SURG 15 STRL LF DISP TIS (BLADE) ×1 IMPLANT
BLADE SURG 15 STRL SS (BLADE) ×2
BNDG GAUZE ELAST 4 BULKY (GAUZE/BANDAGES/DRESSINGS) IMPLANT
CANISTER SUCT 3000ML PPV (MISCELLANEOUS) ×2 IMPLANT
COVER BACK TABLE 60X90IN (DRAPES) ×2 IMPLANT
COVER MAYO STAND STRL (DRAPES) ×2 IMPLANT
COVER WAND RF STERILE (DRAPES) ×2 IMPLANT
DERMABOND ADVANCED (GAUZE/BANDAGES/DRESSINGS) ×2
DERMABOND ADVANCED .7 DNX12 (GAUZE/BANDAGES/DRESSINGS) ×2 IMPLANT
DRAPE LAPAROTOMY 100X72 PEDS (DRAPES) IMPLANT
DRAPE UTILITY XL STRL (DRAPES) ×2 IMPLANT
ELECT REM PT RETURN 15FT ADLT (MISCELLANEOUS) ×2 IMPLANT
GAUZE SPONGE 4X4 12PLY STRL (GAUZE/BANDAGES/DRESSINGS) IMPLANT
GLOVE BIO SURGEON STRL SZ7.5 (GLOVE) ×2 IMPLANT
GLOVE INDICATOR 8.0 STRL GRN (GLOVE) ×2 IMPLANT
GOWN STRL REUS W/ TWL LRG LVL3 (GOWN DISPOSABLE) ×1 IMPLANT
GOWN STRL REUS W/ TWL XL LVL3 (GOWN DISPOSABLE) ×1 IMPLANT
GOWN STRL REUS W/TWL LRG LVL3 (GOWN DISPOSABLE) ×2
GOWN STRL REUS W/TWL XL LVL3 (GOWN DISPOSABLE) ×2
KIT TURNOVER CYSTO (KITS) ×2 IMPLANT
NEEDLE HYPO 22GX1.5 SAFETY (NEEDLE) IMPLANT
NEEDLE HYPO 25X1 1.5 SAFETY (NEEDLE) IMPLANT
NS IRRIG 1000ML POUR BTL (IV SOLUTION) ×2 IMPLANT
PACK BASIN DAY SURGERY FS (CUSTOM PROCEDURE TRAY) ×2 IMPLANT
PENCIL SMOKE EVACUATOR (MISCELLANEOUS) ×2 IMPLANT
SPONGE LAP 18X18 RF (DISPOSABLE) IMPLANT
SPONGE LAP 4X18 RFD (DISPOSABLE) IMPLANT
SUT ETHILON 3 0 FSL (SUTURE) ×2 IMPLANT
SUT MNCRL AB 4-0 PS2 18 (SUTURE) IMPLANT
SUT VIC AB 3-0 SH 27 (SUTURE)
SUT VIC AB 3-0 SH 27XBRD (SUTURE) IMPLANT
SYR BULB EAR ULCER 3OZ GRN STR (SYRINGE) ×2 IMPLANT
SYR CONTROL 10ML LL (SYRINGE) ×2 IMPLANT
TOWEL OR 17X26 10 PK STRL BLUE (TOWEL DISPOSABLE) ×4 IMPLANT
TRAY DSU PREP LF (CUSTOM PROCEDURE TRAY) ×2 IMPLANT
UNDERPAD 30X36 HEAVY ABSORB (UNDERPADS AND DIAPERS) ×2 IMPLANT
YANKAUER SUCT BULB TIP NO VENT (SUCTIONS) ×2 IMPLANT

## 2020-05-19 NOTE — Discharge Instructions (Addendum)
POST OP INSTRUCTIONS  1. DIET: As tolerated. Follow a light bland diet the first 24 hours after arrival home, such as soup, liquids, crackers, etc.  Be sure to include lots of fluids daily.  Avoid fast food or heavy meals as your are more likely to get nauseated.  Eat a low fat the next few days after surgery.  2. Take your usually prescribed home medications unless otherwise directed.  3. PAIN CONTROL: a. Pain is best controlled by a usual combination of three different methods TOGETHER: i. Ice/Heat ii. Over the counter pain medication iii. Prescription pain medication b. Most patients will experience some swelling and bruising around the surgical site.  Ice packs or heating pads (30-60 minutes up to 6 times a day) will help. Some people prefer to use ice alone, heat alone, alternating between ice & heat.  Experiment to what works for you.  Swelling and bruising can take several weeks to resolve.   c. It is helpful to take an over-the-counter pain medication regularly for the first few weeks: i. Ibuprofen (Motrin/Advil) - 200mg  tabs - take 3 tabs (600mg ) every 6 hours as needed for pain ii. Acetaminophen (Tylenol) - you may take 650mg  every 6 hours as needed. You can take this with motrin as they act differently on the body. If you are taking a narcotic pain medication that has acetaminophen in it, do not take over the counter tylenol at the same time.  Iii. NOTE: You may take both of these medications together - most patients  find it most helpful when alternating between the two (i.e. Ibuprofen at 6am, tylenol at 9am, ibuprofen at 12pm ...) d. A  prescription for pain medication should be given to you upon discharge.  Take your pain medication as prescribed if your pain is not adequatly controlled with the over-the-counter pain reliefs mentioned above. e. You may restart your Eliquis and 81 mg aspirin in 48 hours.  4. Avoid getting constipated.  Between the surgery and the pain medications, it  is common to experience some constipation.  Increasing fluid intake and taking a fiber supplement (such as Metamucil, Citrucel, FiberCon, MiraLax, etc) 1-2 times a day regularly will usually help prevent this problem from occurring.  A mild laxative (prune juice, Milk of Magnesia, MiraLax, etc) should be taken according to package directions if there are no bowel movements after 48 hours.    5. Dressing: Your incision is covered in Dermabond which is like sterile superglue for the skin. This will come off on it's own in a couple weeks. It is waterproof and you may bathe normally starting the day after your surgery in a shower. Avoid baths/pools/lakes/oceans until your wounds have fully healed.  6. ACTIVITIES as tolerated:   a. Avoid heavy lifting (>10lbs or 1 gallon of milk) for the next 6 weeks. b. You may resume regular (light) daily activities beginning the next day--such as daily self-care, walking, climbing stairs--gradually increasing activities as tolerated.  If you can walk 30 minutes without difficulty, it is safe to try more intense activity such as jogging, treadmill, bicycling, low-impact aerobics.  c. DO NOT PUSH THROUGH PAIN.  Let pain be your guide: If it hurts to do something, don't do it. d. may drive when you are no longer taking prescription pain medication, you can comfortably wear a seatbelt, and you can safely maneuver your car and apply brakes.   7. FOLLOW UP in our office a. Please call [facility] at [phone] to set up  an appointment to see your surgeon in the office for a follow-up appointment approximately 2 weeks after your surgery. b. Make sure that you call for this appointment the day you arrive home to insure a convenient appointment time.  9. If you have disability or family leave forms that need to be completed, you may have them completed by your primary care physician's office; for return to work instructions, please ask our office staff and they will be happy to  assist you in obtaining this documentation   When to call us [phone]: 1. Poor pain control 2. Reactions / problems with new medications (rash/itching, etc)  3. Fever over 101.5 F (38.5 C) 4. Inability to urinate 5. Nausea/vomiting 6. Worsening swelling or bruising 7. Continued bleeding from incision. 8. Increased pain, redness, or drainage from the incision  The clinic staff is available to answer your questions during regular business hours (8:30am-5pm).  Please don't hesitate to call and ask to speak to one of our nurses for clinical concerns.   A surgeon from [facility] is always on call at the hospitals   If you have a medical emergency, go to the nearest emergency room or call 911.  [facility], [place] [address] [place], [place], [place]  [address] MAIN: [phone] FAX: [phone] [email or web address]  Post Anesthesia Home Care Instructions  Activity: Get plenty of rest for the remainder of the day. A responsible individual must stay with you for 24 hours following the procedure.  For the next 24 hours, DO NOT: -Drive a car -Paediatric nurse -Drink alcoholic beverages -Take any medication unless instructed by your physician -Make any legal decisions or sign important papers.  Meals: Start with liquid foods such as gelatin or soup. Progress to regular foods as tolerated. Avoid greasy, spicy, heavy foods. If nausea and/or vomiting occur, drink only clear liquids until the nausea and/or vomiting subsides. Call your physician if vomiting continues.  Special Instructions/Symptoms: Your throat may feel dry or sore from the anesthesia or the breathing tube placed in your throat during surgery. If this causes discomfort, gargle with warm salt water. The discomfort should disappear within 24 hours.  No additional Tylenol/acetaminophen until after 4:00 pm today if needed.

## 2020-05-19 NOTE — Anesthesia Postprocedure Evaluation (Signed)
Anesthesia Post Note  Patient: [name]  Procedure(s) Performed: EXCISION OF SUBCUTANEOUS MASS-ABDOMEN (N/A Abdomen)     Patient location during evaluation: PACU Anesthesia Type: MAC Level of consciousness: awake and alert and oriented Pain management: pain level controlled Vital Signs Assessment: post-procedure vital signs reviewed and stable Respiratory status: spontaneous breathing, nonlabored ventilation and respiratory function stable Cardiovascular status: blood pressure returned to baseline Postop Assessment: no apparent nausea or vomiting Anesthetic complications: no   No complications documented.  Last Vitals:  Vitals:   05/19/20 1200 05/19/20 1215  BP: (!) 173/114 (!) 148/86  Pulse: 69 65  Resp: 15 16  Temp:  (!) 36.4 C  SpO2: 96% 92%    Last Pain:  Vitals:   05/19/20 1152  TempSrc:   PainSc: 0-No pain                 Brennan Bailey

## 2020-05-19 NOTE — Op Note (Signed)
05/19/2020  11:44 AM  PATIENT:  [name]  75 y.o. male  Patient Care Team: Leanna Battles, MD as PCP - General (Internal Medicine)  PRE-OPERATIVE DIAGNOSIS:  Abdominal wall mass  POST-OPERATIVE DIAGNOSIS:  Same  PROCEDURE:  Excision of abdominal wall mass - 4 x 4 cm  SURGEON:  Sharon Mt. Amairany Schumpert, MD  ASSISTANT: OR staff  ANESTHESIA:   local and MAC  COUNTS:  Sponge, needle and instrument counts were reported correct x2 at the conclusion of the operation.  EBL: 2 mL  DRAINS: None  SPECIMEN: Abdominal wall mass - short suture deep; long suture left  COMPLICATIONS: None  FINDINGS: Firm mass that is mobile within subcutaneous tissue - 4 x 4 cm in size. This was fully excised without rupture/entering mass.  DISPOSITION: PACU in satisfactory condition  DESCRIPTION: The patient was identified in preop holding and taken to the OR where he was placed on the operating room table. SCDs were placed. MAC anesthesia was induced without difficulty.  Pressure points were then padded.  Hair on the abdomen was clipped by the OR team.  He was then prepped and draped in the usual sterile fashion. A surgical timeout was performed indicating the correct patient, procedure, positioning and need for preoperative antibiotics.   The mass had been marked in preop.  The site was identified.  The nodule was palpable.  Local anesthetic consisting of quarter percent Marcaine with epinephrine was infiltrated.  The skin was incised along Langer's lines.  The subcutaneous tissue was dissected with electrocautery.  The mass was approached and identified by direct palpation.  This was circumferentially dissected from the surrounding tissues using electrocautery and including a rim of subcutaneous fat with the specimen and circumferentially.  This was fully excised without entering the mass or rupturing it.  The specimen was oriented with a short stitch on the deep aspect of it which is the side closest to the  abdominal wall fascia and an additional long suture on the left side.  This was passed off as a specimen.  The wound was then irrigated.  Hemostasis was achieved with electrocautery.  The wound was then closed in layers with deep dermal 3-0 Vicryl sutures followed by a running 4-0 Monocryl subcuticular suture.  Sponge, needle, and instrument counts had now been reported correct x2.  A dressing consisting of Dermabond was applied.  He was then awakened and transferred to a stretcher for transport to PACU in satisfactory condition.

## 2020-05-19 NOTE — Transfer of Care (Signed)
Immediate Anesthesia Transfer of Care Note  Patient: [name]  Procedure(s) Performed: EXCISION OF SUBCUTANEOUS MASS-ABDOMEN (N/A Abdomen)  Patient Location: PACU  Anesthesia Type:MAC  Level of Consciousness: awake, alert  and oriented  Airway & Oxygen Therapy: Patient Spontanous Breathing  Post-op Assessment: Report given to RN  Post vital signs: Reviewed and stable  Last Vitals:  Vitals Value Taken Time  BP 158/76   Temp    Pulse 68 05/19/20 1153  Resp 21 05/19/20 1153  SpO2 95 % 05/19/20 1153  Vitals shown include unvalidated device data.  Last Pain:  Vitals:   05/19/20 0958  TempSrc: Oral  PainSc: 0-No pain      Patients Stated Pain Goal: 5 (29/56/21 3086)  Complications: No complications documented.

## 2020-05-19 NOTE — OR Nursing (Signed)
Tramadol ordered for pt to take for post op discomfortl  Pt states he was told not to mix tramadol and eliquis.  Dr. Cliffton Asters notified and told me to check with [facility] for clarification.  Pharmacist Dru states there should be no reason pt cant take tramadol and eliquis.  Information given to patient.  Margarita Mail rn

## 2020-05-19 NOTE — H&P (Signed)
CC: Referred by Truitt Merle NP for newly identified subQ nodule left and superior to umbilicus  HPI: Mr. [name] is a very pleasant [phone] with hx of afib (on Eliquis), CAD (s/p CABG), HTN, HLD, DM, prostate ca and thymoma found incidentally at CABG whom is referred to [place] for evaluation of recently identified subcutaneous mass in abdomen - just to the left and cephalad to umbilicus. Reports having first noticed this a few months ago - having been more active and noticing this more recently. Unclear if it is actually burns as refused had some weight loss that has made it more apparent. He denies any pain related to this. He denies any skin changes. He denies any known trauma to this location of his abdomen in the past. He denies any bruising at this location before. He also has a soft reducible incisional hernia at the base of his CABG incision that contained some fat. This has not been bothersome to him whatsoever.  INTERVAL HX He has been doing well. Denies any complaints today. He denies any changes in his health or health history since we met including medications or allergies. He discontinued Eliquis >2 days ago. He states he is ready for surgery.  PMH: afib (on Eliquis), CAD (s/p CABG), HTN, HLD, DM, prostate ca and thymoma  PSH: CABG; denies any prior abdominal surgeries or procedures  FHx: Denies FHx of colorectal, breast, endometrial, ovarian or cervical cancer  Social: Denies use of tobacco/EtOH/drugs. He is here today with his wife.  ROS: A comprehensive 10 system review of systems was completed with the patient and pertinent findings as noted above.  Past Medical History:  Diagnosis Date  . Anticoagulant long-term use    eliquis/ asa --- mangaed by cardiology  . Coronary artery disease cardiologist--- Truitt Merle PA   s/p cardiac cath 03-14-2019 severe 3V ;  05-27-2019  s/p  CABG x3/ clipping atrial appendage  . Depression   . DOE (dyspnea on exertion)     05-04-2020  per pt is is much better since CABG 01/ 2021,  recovers quickly after going up flight stairs  . Feeling of incomplete bladder emptying   . Full dentures   . Heart murmur   . History of prostate cancer 1998   urologist--- dr Junious Silk---  s/p radioactive seed implants 1998  . History of urethral stricture    s/p  dilatation  . Hypertension   . Incisional hernia    per pt was told by dr gerhardt, had incisional hernia at CABG incision  . Macular degeneration of both eyes   . Nephrolithiasis    right side nonobstructive per CT 05-01-2020,  also had 2 right ureter stones and on 05-04-2020 pt stated he passed 2 stones 05-03-2020  . OSA on CPAP    uses CPAP-settings 5  . Osteoarthritis (arthritis due to wear and tear of joints)   . Persistent atrial fibrillation [facility])    cardiologist-- Remer Macho PA  . Slow urinary stream   . Type 2 diabetes mellitus ([place])    followed by pcp  (05-04-2020  per pt fasting blood sugar average 98-120)  . 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 Wears hearing aid in both ears     Past Surgical History:  Procedure Laterality Date  . CATARACT EXTRACTION W/ INTRAOCULAR LENS  IMPLANT, BILATERAL  2019  . CLIPPING OF ATRIAL  APPENDAGE N/A 05/27/2019   Procedure: CLIPPING OF ATRIAL APPENDAGE - Using AtriCure Clip size 45MM;  Surgeon: Grace Isaac, MD;  Location: [facility];  Service: Open Heart Surgery;  Laterality: N/A;  . CORONARY ARTERY BYPASS GRAFT N/A 05/27/2019   Procedure: CORONARY ARTERY BYPASS GRAFTING (CABG) x Three , using left internal mammary artery and right leg greater saphenous vein harvested endoscopically LIMA to LAD, SVG to DIAG, SVG to RCA. Incision of left internal mammary lymph node Incision of Left Internal  Mediastinal Mass;  Surgeon: Grace Isaac, MD;  Location: [facility];  Service: Open Heart Surgery;   Laterality: N/A;  . CYSTOSCOPY W/ RETROGRADES  04/04/2012   Procedure: CYSTOSCOPY WITH RETROGRADE PYELOGRAM;  Surgeon: Molli Hazard, MD;  Location: [facility];  Service: Urology;  Laterality: Bilateral;  . CYSTOSCOPY WITH RETROGRADE PYELOGRAM, URETEROSCOPY AND STENT PLACEMENT  04/04/2012   Procedure: CYSTOSCOPY WITH RETROGRADE PYELOGRAM, URETEROSCOPY AND STENT PLACEMENT;  Surgeon: Molli Hazard, MD;  Location: [facility];  Service: Urology;  Laterality: Right;  . LEFT HEART CATH AND CORONARY ANGIOGRAPHY N/A 03/14/2019   Procedure: LEFT HEART CATH AND CORONARY ANGIOGRAPHY;  Surgeon: Belva Crome, MD;  Location:  [facility];  Service: Cardiovascular;  Laterality: N/A;  . LUMBAR LAMINECTOMY/DECOMPRESSION MICRODISCECTOMY N/A 09/27/2017   Procedure: LAMINECTOMY LUMBAR THREE- LUMBAR FOUR, LUMBAR FOUR- LUMBAR FIVE;  Surgeon: Ashok Pall, MD;  Location: [facility];  Service: Neurosurgery;  Laterality: N/A;  . RADIOACTIVE PROSTATE SEED IMPLANTS  1998  . ROTATOR CUFF REPAIR Left approx. 2010  . TEE WITHOUT CARDIOVERSION N/A 05/27/2019   Procedure: TRANSESOPHAGEAL ECHOCARDIOGRAM (TEE);  Surgeon: Grace Isaac, MD;  Location: [facility];  Service: Open Heart Surgery;  Laterality: N/A;  . TOTAL KNEE ARTHROPLASTY Left 08/06/2014   Procedure: LEFT TOTAL KNEE ARTHROPLASTY;  Surgeon: Susa Day, MD;  Location: [facility];  Service: Orthopedics;  Laterality: Left;    Family History  Problem Relation Age of Onset  . Cancer Mother   . Dementia Mother   . Colon cancer Mother   . Heart attack Sister   . Stroke Sister   . Heart attack Father   . Esophageal cancer Neg Hx   . Rectal cancer Neg Hx   . Stomach cancer Neg Hx     Social:  reports that he quit smoking about 47 years ago. His smoking use included cigarettes. He quit after 6.00 years of use. He has never used smokeless tobacco. He reports current alcohol use. He reports that he does not use drugs.  Allergies:  Allergies  Allergen  Reactions  . Lisinopril Cough  . Sulfa Drugs Cross Reactors     CHILDHOOD UNSPECIFIED REACTION   . Contrast Media [Iodinated Diagnostic Agents] Rash  . Doxycycline Itching  . Ioxaglate Rash  . Tetracyclines & Related Rash    Medications: I have reviewed the patient's current medications.  Results for orders placed or performed during the hospital encounter of 05/17/20 (from the past 48 hour(s))  SARS CORONAVIRUS 2 (TAT 6-24 HRS) Nasopharyngeal Nasopharyngeal Swab     Status: None   Collection Time: 05/17/20 10:30 AM   Specimen: Nasopharyngeal Swab  Result Value Ref Range   SARS Coronavirus 2 NEGATIVE NEGATIVE    Comment: (NOTE) SARS-CoV-2 target nucleic acids are NOT DETECTED.  The SARS-CoV-2 RNA is generally detectable in upper and lower respiratory specimens during the acute phase of infection. Negative results do not preclude SARS-CoV-2 infection, do not rule out co-infections with other pathogens, and should not  be used as the sole basis for treatment or other patient management decisions. Negative results must be combined with clinical observations, patient history, and epidemiological information. The expected result is Negative.  Fact Sheet for Patients: SugarRoll.be  Fact Sheet for Healthcare Providers: https://www.woods-mathews.com/  This test is not yet approved or cleared by the [place] FDA and  has been authorized for detection and/or diagnosis of SARS-CoV-2 by FDA under an Emergency Use Authorization (EUA). This EUA will remain  in effect (meaning this test can be used) for the duration of the COVID-19 declaration under Se ction 564(b)(1) of the Act, 21 U.S.C. section 360bbb-3(b)(1), unless the authorization is terminated or revoked sooner.  Performed at [facility], [place] [address], [place], [place] [address]     No results found.  ROS - all of the below systems have been reviewed with the patient and  positives are indicated with bold text General: chills, fever or night sweats Eyes: blurry vision or double vision ENT: epistaxis or sore throat Allergy/Immunology: itchy/watery eyes or nasal congestion Hematologic/Lymphatic: bleeding problems, blood clots or swollen lymph nodes Endocrine: temperature intolerance or unexpected weight changes Breast: new or changing breast lumps or nipple discharge Resp: cough, shortness of breath, or wheezing CV: chest pain or dyspnea on exertion GI: as per HPI GU: dysuria, trouble voiding, or hematuria MSK: joint pain or joint stiffness Neuro: TIA or stroke symptoms Derm: pruritus and skin lesion changes Psych: anxiety and depression  PE Height 6' (1.829 m), weight 98.4 kg. Constitutional: NAD; conversant; wearing mask Eyes: Moist conjunctiva; no lid lag; anicteric Lungs: Normal respiratory effort CV: no pitting edema GI: Abd soft, NT/ND; palpable mass marked with his approval on abdominal wall; no palpable hepatosplenomegaly MSK: Normal range of motion of extremities; Psychiatric: Appropriate affect; alert and oriented x3  Results for orders placed or performed during the hospital encounter of 05/17/20 (from the past 48 hour(s))  SARS CORONAVIRUS 2 (TAT 6-24 HRS) Nasopharyngeal Nasopharyngeal Swab     Status: None   Collection Time: 05/17/20 10:30 AM   Specimen: Nasopharyngeal Swab  Result Value Ref Range   SARS Coronavirus 2 NEGATIVE NEGATIVE    Comment: (NOTE) SARS-CoV-2 target nucleic acids are NOT DETECTED.  The SARS-CoV-2 RNA is generally detectable in upper and lower respiratory specimens during the acute phase of infection. Negative results do not preclude SARS-CoV-2 infection, do not rule out co-infections with other pathogens, and should not be used as the sole basis for treatment or other patient management decisions. Negative results must be combined with clinical observations, patient history, and epidemiological information.  The expected result is Negative.  Fact Sheet for Patients: SugarRoll.be  Fact Sheet for Healthcare Providers: https://www.woods-mathews.com/  This test is not yet approved or cleared by the [place] FDA and  has been authorized for detection and/or diagnosis of SARS-CoV-2 by FDA under an Emergency Use Authorization (EUA). This EUA will remain  in effect (meaning this test can be used) for the duration of the COVID-19 declaration under Se ction 564(b)(1) of the Act, 21 U.S.C. section 360bbb-3(b)(1), unless the authorization is terminated or revoked sooner.  Performed at [facility], [place] [address], [place], [place] [address]     No results found.   A/P: Mr. Mauger is a very pleasant [phone] with hx afib (on Eliquis), CAD (s/p CABG), HTN, HLD, DM, prostate ca and thymoma - here for evaluation of soft tissue mass, subcutaneous, 2x2 cm, left/cephalad to umbilicus on abdomen  CT AP +  MRI Abd shows ~2 x 2 cm soft tissue nodule, partially calcified - unclear what this represents. Is new since 2015.  -We spent time reviewing everything again today. We discussed excision of soft tissue mass from the abdominal subcutaneous tissue. -The anatomy and physiology of the abdominal wall was discussed as was pathophysiology of these lesions. -The planned procedure, material risks (including, but not limited to, pain, bleeding, infection, scarring, hematoma, seroma, need for additional procedures, recurrence, chronic pain, pneumonia, heart attack, stroke, death) benefits and alternatives to surgery were discussed at length. The patient's questions were answered to his satisfaction, he voiced understanding and elected to proceed with surgery. Additionally, we discussed typical postoperative expectations and the recovery process. -Cardiac clearance obtained; off Eliquis >48hrs. We reviewed restarting blood thinner in ~48 hrs following  surgery  Sharon Mt. Dema Severin, M.D. [facility] Use [email or web address] to contact on call provider

## 2020-05-19 NOTE — Anesthesia Procedure Notes (Signed)
Procedure Name: MAC Date/Time: 05/19/2020 11:13 AM Performed by: Rogers Blocker, CRNA Pre-anesthesia Checklist: Patient identified, Emergency Drugs available, Suction available and Patient being monitored Patient Re-evaluated:Patient Re-evaluated prior to induction Oxygen Delivery Method: Nasal cannula Placement Confirmation: positive ETCO2

## 2020-05-20 ENCOUNTER — Encounter (HOSPITAL_BASED_OUTPATIENT_CLINIC_OR_DEPARTMENT_OTHER): Payer: Self-pay | Admitting: Surgery

## 2020-05-21 LAB — SURGICAL PATHOLOGY

## 2020-05-25 NOTE — Progress Notes (Signed)
CARDIOLOGY OFFICE NOTE  Date:  06/08/2020    [name] Date of Birth: Sep 21, 1945 Medical Record #[id number]  PCP:  Leanna Battles, MD  Cardiologist:  Servando Snare   Chief Complaint  Patient presents with  . Follow-up    History of Present Illness: [name] is a 75 y.o. male who presents today for a follow up visit.  Former patient of Dr. Claris Gladden and Dr. Susa Simmonds. He basically follows with me. I see his wife as well.    He has chronic atrial fibrillation that has recurred after multiple cardioversions. He was previously on Coumadin and then switched over to Eliquis. He had a low risk Myoview in October of 2016. He has had coronary calcifications noted on prior CT scans. He also has OSA on CPAP, hypertension, hyperlipidemia, obesity. Last CT of the chest from 10/2017 - to repeat in 2 years.    I have followed him over the past several years. He had more issues with back pain with associated HTN - we adjusted his medicines. He had his back fixed - BP came down and we were able to titrate down and even stop some of his antihypertensives.  We did a telehealth visit back in June - his ACE had been changed to ARB due to cough. Otherwise was doing ok. Stable DOE.    When here back in October 2020 - noted a new onset of exertional tightness in the his chest - coronary CT was arranged - this is abnormal with +FFR noted. Referred for cardiac cath - to try and manage medically with aggressive CV risk factor modification but ended up referring on for CABG x 3 along with LA clipping on 05/27/2019 after failing on medical therapy. Post op course was unremarkable. He was placed back on his Eliquis for his AF. He did have a possible thymoma was removed - pathology was unclear with further disposition pending. On follow up in early March - he was not doing well - was more short of breath - pleuritic chest pain - had had his 2nd vaccine. We ended up sending for CXR, checking for COVID - no real clear  cut etiology but he was greatly improved on follow up later in March. He had seen Dr. Sondra Come - no treatment needed - for follow up scan with EBG in 6 months. Last seen in September and he was doing well.    Comes in today. Here with [name] - she is going to transition over to Dr. Johney Frame when I leave next month. [name] is doing well. No chest pain. He had a 4 x 4 cm mass removed from the subu tissue of his belly earlier this month - did fine with this. Turned out benign. No chest pain. His swelling is basically resolved - he admits he could do better with salt restriction. Not dizzy or lightheaded. No syncope. Tolerating his anticoagulation without issue. They both feel like he is doing well.   Past Medical History:  Diagnosis Date  . Anticoagulant long-term use    eliquis/ asa --- mangaed by cardiology  . Coronary artery disease cardiologist--- Truitt Merle PA   s/p cardiac cath 03-14-2019 severe 3V ;  05-27-2019  s/p  CABG x3/ clipping atrial appendage  . Depression   . DOE (dyspnea on exertion)    05-04-2020  per pt is is much better since CABG 01/ 2021,  recovers quickly after going up flight stairs  . Feeling of incomplete bladder emptying   .  Full dentures   . Heart murmur   . History of prostate cancer 1998   urologist--- dr Junious Silk---  s/p radioactive seed implants 1998  . History of urethral stricture    s/p  dilatation  . Hypertension   . Incisional hernia    per pt was told by dr Karrington Mccravy, had incisional hernia at CABG incision  . Macular degeneration of both eyes   . Nephrolithiasis    right side nonobstructive per CT 05-01-2020,  also had 2 right ureter stones and on 05-04-2020 pt stated he passed 2 stones 05-03-2020  . OSA on CPAP    uses CPAP-settings 5  . Osteoarthritis (arthritis due to wear and tear of joints)   . Persistent atrial fibrillation [facility])    cardiologist-- Remer Macho PA  . Slow urinary stream   . Type 2 diabetes mellitus ([place])    followed by pcp   (05-04-2020  per pt fasting blood sugar average 98-120)  . 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 Wears hearing aid in both ears     Past Surgical History:  Procedure Laterality Date  . CATARACT EXTRACTION W/ INTRAOCULAR LENS  IMPLANT, BILATERAL  2019  . CLIPPING OF ATRIAL APPENDAGE N/A 05/27/2019   Procedure: CLIPPING OF ATRIAL APPENDAGE - Using AtriCure Clip size 45MM;  Surgeon: Grace Isaac, MD;  Location: [facility];  Service: Open Heart Surgery;  Laterality: N/A;  . CORONARY ARTERY BYPASS GRAFT N/A 05/27/2019   Procedure: CORONARY ARTERY BYPASS GRAFTING (CABG) x Three , using left internal mammary artery and right leg greater saphenous vein harvested endoscopically LIMA to LAD, SVG to DIAG, SVG to RCA. Incision of left internal mammary lymph node Incision of Left Internal  Mediastinal Mass;  Surgeon: Grace Isaac, MD;  Location: [place];  Service: Open Heart Surgery;  Laterality: N/A;  . CYSTOSCOPY W/ RETROGRADES  04/04/2012   Procedure: CYSTOSCOPY WITH RETROGRADE PYELOGRAM;  Surgeon: Molli Hazard, MD;  Location: [facility];  Service: Urology;  Laterality: Bilateral;  . CYSTOSCOPY WITH RETROGRADE PYELOGRAM, URETEROSCOPY AND STENT PLACEMENT  04/04/2012   Procedure: CYSTOSCOPY WITH RETROGRADE PYELOGRAM, URETEROSCOPY AND STENT PLACEMENT;  Surgeon: Molli Hazard, MD;  Location: [facility];  Service: Urology;  Laterality: Right;  . EXCISION MASS ABDOMINAL N/A 05/19/2020   Procedure: EXCISION OF SUBCUTANEOUS MASS-ABDOMEN;  Surgeon: Ileana Roup, MD;  Location: [facility];  Service: General;  Laterality: N/A;  . LEFT HEART CATH AND CORONARY ANGIOGRAPHY N/A 03/14/2019   Procedure: LEFT HEART CATH AND CORONARY ANGIOGRAPHY;  Surgeon: Belva Crome, MD;  Location: [facility];  Service: Cardiovascular;  Laterality: N/A;  . LUMBAR  LAMINECTOMY/DECOMPRESSION MICRODISCECTOMY N/A 09/27/2017   Procedure: LAMINECTOMY LUMBAR THREE- LUMBAR FOUR, LUMBAR FOUR- LUMBAR FIVE;  Surgeon: Ashok Pall, MD;  Location: [facility];  Service: Neurosurgery;  Laterality: N/A;  . RADIOACTIVE PROSTATE SEED IMPLANTS  1998  . ROTATOR CUFF REPAIR Left approx. 2010  . TEE WITHOUT CARDIOVERSION N/A 05/27/2019   Procedure: TRANSESOPHAGEAL ECHOCARDIOGRAM (TEE);  Surgeon: Grace Isaac, MD;  Location: [facility];  Service: Open Heart Surgery;  Laterality: N/A;  . TOTAL KNEE ARTHROPLASTY Left 08/06/2014   Procedure: LEFT TOTAL KNEE ARTHROPLASTY;  Surgeon: Susa Day, MD;  Location: [facility];  Service: Orthopedics;  Laterality: Left;     Medications: Current Meds  Medication Sig  . allopurinol (ZYLOPRIM)  300 MG tablet Take 300 mg by mouth daily.  Marland Kitchen aspirin EC 81 MG EC tablet Take 1 tablet (81 mg total) by mouth daily.  Marland Kitchen atorvastatin (LIPITOR) 40 MG tablet Take 1 tablet (40 mg total) by mouth daily at 6 PM.  . ELIQUIS 5 MG TABS tablet TAKE 1 TABLET BY MOUTH  TWICE DAILY  . furosemide (LASIX) 20 MG tablet Take 1 tablet (20 mg total) by mouth daily as needed for fluid or edema.  Marland Kitchen losartan (COZAAR) 100 MG tablet Take 50 mg by mouth daily.  . metFORMIN (GLUCOPHAGE) 1000 MG tablet Take 500 mg by mouth daily with breakfast.  . metoprolol tartrate (LOPRESSOR) 25 MG tablet Take 1 tablet (25 mg total) by mouth 2 (two) times daily.  . Multiple Vitamin (MULTIVITAMIN WITH MINERALS) TABS tablet Take 1 tablet by mouth daily. Centrum Silver  . Multiple Vitamins-Minerals (PRESERVISION AREDS 2) CAPS Take 1 capsule by mouth 2 (two) times daily.   . ondansetron (ZOFRAN ODT) 4 MG disintegrating tablet Take 1 tablet (4 mg total) by mouth every 8 (eight) hours as needed for nausea or vomiting.  Marland Kitchen oxyCODONE-acetaminophen (PERCOCET) 5-325 MG tablet Take 1 tablet by mouth every 4 (four) hours as needed for severe pain.  . tamsulosin (FLOMAX) 0.4 MG CAPS capsule Take 1 capsule  (0.4 mg total) by mouth daily.     Allergies: Allergies  Allergen Reactions  . Lisinopril Cough  . Sulfa Drugs Cross Reactors     CHILDHOOD UNSPECIFIED REACTION   . Contrast Media [Iodinated Diagnostic Agents] Rash  . Doxycycline Itching  . Ioxaglate Rash  . Tetracyclines & Related Rash    Social History: The patient  reports that he quit smoking about 47 years ago. His smoking use included cigarettes. He quit after 6.00 years of use. He has never used smokeless tobacco. He reports current alcohol use. He reports that he does not use drugs.   Family History: The patient's family history includes Cancer in his mother; Colon cancer in his mother; Dementia in his mother; Heart attack in his father and sister; Stroke in his sister.   Review of Systems: Please see the history of present illness.   All other systems are reviewed and negative.   Physical Exam: VS:  BP 120/80 (BP Location: Left Arm, Patient Position: Sitting, Cuff Size: Normal)   Pulse (!) 56   Ht 6' (1.829 m)   Wt 219 lb (99.3 kg)   SpO2 96%   BMI 29.70 kg/m  .  BMI Body mass index is 29.7 kg/m.  Wt Readings from Last 3 Encounters:  06/08/20 219 lb (99.3 kg)  05/19/20 222 lb 6.4 oz (100.9 kg)  05/04/20 216 lb 14.9 oz (98.4 kg)    General: Pleasant. Alert and in no acute distress.   HEENT: Normal.  Neck: Supple, no JVD, carotid bruits, or masses noted.  Cardiac: Irregular irregular rhythm. Rate is ok. No murmurs, rubs, or gallops. No edema.  Respiratory:  Lungs are clear to auscultation bilaterally with normal work of breathing.  GI: Soft and nontender.  MS: No deformity or atrophy. Gait and ROM intact.  Skin: Warm and dry. Color is normal.  Neuro:  Strength and sensation are intact and no gross focal deficits noted.  Psych: Alert, appropriate and with normal affect.   LABORATORY DATA:  EKG:  EKG is ordered today.  Personally reviewed by me. This demonstrates AF with controlled VR of 56.  Lab Results   Component Value Date   WBC  5.8 05/01/2020   HGB 14.6 05/19/2020   HCT 43.0 05/19/2020   PLT 147 (L) 05/01/2020   GLUCOSE 107 (H) 05/19/2020   CHOL 120 09/30/2019   TRIG 91 09/30/2019   HDL 41 09/30/2019   LDLCALC 61 09/30/2019   ALT 39 05/01/2020   AST 35 05/01/2020   NA 142 05/19/2020   K 4.1 05/19/2020   CL 105 05/19/2020   CREATININE 1.10 05/19/2020   BUN 22 05/19/2020   CO2 23 05/01/2020   INR 1.6 (H) 05/27/2019   HGBA1C 5.5 05/23/2019     BNP (last 3 results) No results for input(s): BNP in the last 8760 hours.  ProBNP (last 3 results) Recent Labs    09/30/19 1107  PROBNP 1,513*     Other Studies Reviewed Today:  CABG 05/2019 PROCEDURE PERFORMED: 1 Coronary artery bypass grafting x3 with the left internal mammary to the left anterior descending coronary artery, reverse saphenous vein graft to the diagonal coronary artery, reverse saphenous vein graft to the distal right  coronary artery with right greater saphenous thigh endoscopic vein harvesting 2. Placement of Atriclip- Left Atrial Clip 45 mm  3. Sampling of left internal mammary lymph node 4. 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     LEFT HEART CATH AND CORONARY ANGIOGRAPHY 03/14/2019  Conclusion    Moderately severe three-vessel coronary disease.  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  2 small obtuse marginal branch is supplied on the left lateral wall.  40% proximal LAD with 60 to 70% diffuse mid to distal LAD.  First diagonal contains eccentric 50 to 70% narrowing.  Right coronary is diffusely diseased with mid 60 to 65% stenosis.  Distal 75% stenosis before small PDA.  99% stenosis in the continuation of the right coronary before the second PLA.  The acute marginal branch of the right coronary has multifocal 95% stenoses.  Low normal LVEF approximately 50% with normal EDP.   RECOMMENDATIONS:    Aggressive risk  preventive therapy with LDL target less than 55.  Add long-acting nitrate therapy to improve exertional dyspnea and chest tightness.  If unacceptable symptoms, would consider multivessel coronary bypass grafting.  In absence of high-grade proximal CAD, I feel a measured approach attempting to control symptoms with medications is a reasonable first option.          ASSESSMENT & PLAN:     1. CAD - s/p CABG x 3 per EBG - now one year out - doing well - needs to continue with aspirin/statin/beta blocker and CV risk factor modification.   2. Persistent AF - has had for many years - remains on anticoagulation - did have clipping of his LA at time of his CABG but there has been no plan to try and restore NSR - rate is fine. No changes made today.   3. HLD - on statin therapy - lab on return  4. HTN - BP looks good on his current regimen. No changes made today.   5. Chronic anticoagulation - no problems noted.   6. AB thymoma - no plan for radiation - to have repeat scan in October per EBG's last note - not discussed today.   7. Lung nodule - will have follow up scan - see #6.   Current medicines are reviewed with the patient today.  The patient does not have concerns regarding medicines other than what has been noted above.  The following changes have  been made:  See above.  Labs/ tests ordered today include:    Orders Placed This Encounter  Procedures  . EKG 12-Lead     Disposition:   FU with Dr. Johney Frame in April - [name] is seeing her then as well. They are both aware that I am leaving next month. Juwan is doing well. No changes made today.     Patient is agreeable to this plan and will call if any problems develop in the interim.   SignedTruitt Merle, NP  06/08/2020 10:42 AM  [facility] [address] [place], [place]  [address] Phone: [phone] Fax: [phone]

## 2020-06-08 ENCOUNTER — Ambulatory Visit: Payer: Medicare Other | Admitting: Nurse Practitioner

## 2020-06-08 ENCOUNTER — Other Ambulatory Visit: Payer: Self-pay

## 2020-06-08 ENCOUNTER — Encounter: Payer: Self-pay | Admitting: Nurse Practitioner

## 2020-06-08 VITALS — BP 120/80 | HR 56 | Ht 72.0 in | Wt 219.0 lb

## 2020-06-08 DIAGNOSIS — Z951 Presence of aortocoronary bypass graft: Secondary | ICD-10-CM

## 2020-06-08 DIAGNOSIS — Z7901 Long term (current) use of anticoagulants: Secondary | ICD-10-CM

## 2020-06-08 DIAGNOSIS — I4819 Other persistent atrial fibrillation: Secondary | ICD-10-CM

## 2020-06-08 DIAGNOSIS — I10 Essential (primary) hypertension: Secondary | ICD-10-CM | POA: Diagnosis not present

## 2020-06-08 DIAGNOSIS — E785 Hyperlipidemia, unspecified: Secondary | ICD-10-CM | POA: Diagnosis not present

## 2020-06-08 NOTE — Patient Instructions (Addendum)
After Visit Summary:  We will be checking the following labs today - NONE   Medication Instructions:    Continue with your current medicines.    If you need a refill on your cardiac medications before your next appointment, please call your pharmacy.     Testing/Procedures To Be Arranged:  N/A  Follow-Up:  See Dr. Johney Frame in April - will try to get with Pat's visit - come fasting APPOINTMENT ON 08/23/20 AT 9:00 AM   At [facility], you and your health needs are our priority.  As part of our continuing mission to provide you with exceptional heart care, we have created designated Provider Care Teams.  These Care Teams include your primary Cardiologist (physician) and Advanced Practice Providers (APPs -  Physician Assistants and Nurse Practitioners) who all work together to provide you with the care you need, when you need it.  Special Instructions:  . Stay safe, wash your hands for at least 20 seconds and wear a mask when needed.  . It was good to talk with you today. . Thank you both!   Call the [facility] office at [phone] if you have any questions, problems or concerns.

## 2020-06-22 ENCOUNTER — Other Ambulatory Visit: Payer: Self-pay | Admitting: Nurse Practitioner

## 2020-06-22 DIAGNOSIS — I259 Chronic ischemic heart disease, unspecified: Secondary | ICD-10-CM

## 2020-06-22 DIAGNOSIS — Z951 Presence of aortocoronary bypass graft: Secondary | ICD-10-CM

## 2020-06-29 ENCOUNTER — Telehealth: Payer: Self-pay

## 2020-06-29 NOTE — Telephone Encounter (Signed)
Patient's wife, [name] contacted the office about patient's increased shortness of breath.  He is s/p CABG x3 with Dr. Servando Snare 05/27/19 and stated that he was "inbetween Cardiologist", I advised to contact their office as he is an established patient.  She acknowledged receipt.

## 2020-07-01 ENCOUNTER — Ambulatory Visit (INDEPENDENT_AMBULATORY_CARE_PROVIDER_SITE_OTHER): Payer: Medicare Other

## 2020-07-01 ENCOUNTER — Ambulatory Visit: Payer: Medicare Other | Admitting: Cardiology

## 2020-07-01 ENCOUNTER — Encounter: Payer: Self-pay | Admitting: Cardiology

## 2020-07-01 ENCOUNTER — Other Ambulatory Visit: Payer: Self-pay

## 2020-07-01 VITALS — BP 144/88 | HR 59 | Ht 72.0 in | Wt 221.0 lb

## 2020-07-01 DIAGNOSIS — Z7901 Long term (current) use of anticoagulants: Secondary | ICD-10-CM

## 2020-07-01 DIAGNOSIS — I10 Essential (primary) hypertension: Secondary | ICD-10-CM

## 2020-07-01 DIAGNOSIS — R0602 Shortness of breath: Secondary | ICD-10-CM

## 2020-07-01 DIAGNOSIS — R001 Bradycardia, unspecified: Secondary | ICD-10-CM | POA: Diagnosis not present

## 2020-07-01 NOTE — Progress Notes (Signed)
Cardiology Office Note:    Date:  07/01/2020   ID:  [name], DOB August 05, 1945, MRN [id number]  PCP:  [name], [place]  Cardiologist:  No primary care provider on file.  Advanced Practice Provider:  No care team member to display Electrophysiologist:  None  :[id number]}   Referring MD: Sean Battles, MD    History of Present Illness:    [name] is a 75 y.o. male with a hx of CAD s/p CABG x3 with LA clipping on 05/27/19, chronic atrial fibrillation that has recurred after multiple DCCV initially on warfarin now on eliquis, OSA on CPAP, HTN and HLD who was previously followed by [name] who now presents to clinic for follow-up.  Last saw [name] on 06/08/20 where he was doing well. No changes in medication at that time.   The patient states that over the past several weeks, he has become more short of breath with activity with associated chest tightness, more confused/out of it and fatigued requiring daytime napping. His energy levels have significantly declined. Prior to a couple of weeks ago, he was able to walk around without issues and now he is finding that he needs to take frequent breaks to rest. No recent changes in medications or recent illnesses.  Has been off the lasix for several months without issue, however, he thinks his legs have been a little swollen lately. Notably had an abdominal tumor removed in early January which was benign. No complications post surgery, but may have received a lot of IVF for the procedure. Denies orthopnea, PND, palpitations, chest pain at rest, fevers, or chills. States his blood pressure at home has been running 170-180s at home and diastolic 109N. Does not known heart rates. He is not sure his cuff is accurate as the blood pressure in the office is much lower than what he has been getting at home. Previously, his blood pressures were well controlled and were in the 120s during last visit at the end  of Jan.  Also had some hematuria with passing kidney stone with planned follow-up with Urology. Has been on tamsulosin. This medication is new over the past several weeks.   Past Medical History:  Diagnosis Date  . Anticoagulant long-term use    eliquis/ asa --- mangaed by cardiology  . Coronary artery disease cardiologist--- Truitt Merle PA   s/p cardiac cath 03-14-2019 severe 3V ;  05-27-2019  s/p  CABG x3/ clipping atrial appendage  . Depression   . DOE (dyspnea on exertion)    05-04-2020  per pt is is much better since CABG 01/ 2021,  recovers quickly after going up flight stairs  . Feeling of incomplete bladder emptying   . Full dentures   . Heart murmur   . History of prostate cancer 1998   urologist--- dr [name]---  s/p radioactive seed implants 1998  . History of urethral stricture    s/p  dilatation  . Hypertension   . Incisional hernia    per pt was told by dr gerhardt, had incisional hernia at CABG incision  . Macular degeneration of both eyes   . Nephrolithiasis    right side nonobstructive per CT 05-01-2020,  also had 2 right ureter stones and on 05-04-2020 pt stated he passed 2 stones 05-03-2020  . OSA on CPAP    uses CPAP-settings 5  . Osteoarthritis (arthritis due to wear and tear of joints)   . Persistent atrial fibrillation ([place])  cardiologist-- Remer Macho PA  . Slow urinary stream   . Type 2 diabetes mellitus ([place])    followed by pcp  (05-04-2020  per pt fasting blood sugar average 98-120)  . 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 Wears hearing aid in both ears     Past Surgical History:  Procedure Laterality Date  . CATARACT EXTRACTION W/ INTRAOCULAR LENS  IMPLANT, BILATERAL  2019  . CLIPPING OF ATRIAL APPENDAGE N/A 05/27/2019   Procedure: CLIPPING OF ATRIAL APPENDAGE - Using AtriCure Clip size 45MM;  Surgeon: Sean Isaac, MD;  Location: [place];  Service: Open Heart Surgery;  Laterality: N/A;  . CORONARY ARTERY BYPASS GRAFT N/A 05/27/2019   Procedure: CORONARY ARTERY BYPASS GRAFTING (CABG) x Three , using left internal mammary artery and right leg greater saphenous vein harvested endoscopically LIMA to LAD, SVG to DIAG, SVG to RCA. Incision of left internal mammary lymph node Incision of Left Internal  Mediastinal Mass;  Surgeon: Sean Isaac, MD;  Location: [place];  Service: Open Heart Surgery;  Laterality: N/A;  . CYSTOSCOPY W/ RETROGRADES  04/04/2012   Procedure: CYSTOSCOPY WITH RETROGRADE PYELOGRAM;  Surgeon: Sean Hazard, MD;  Location: [facility];  Service: Urology;  Laterality: Bilateral;  . CYSTOSCOPY WITH RETROGRADE PYELOGRAM, URETEROSCOPY AND STENT PLACEMENT  04/04/2012   Procedure: CYSTOSCOPY WITH RETROGRADE PYELOGRAM, URETEROSCOPY AND STENT PLACEMENT;  Surgeon: Sean Hazard, MD;  Location: [facility];  Service: Urology;  Laterality: Right;  . EXCISION MASS ABDOMINAL N/A 05/19/2020   Procedure: EXCISION OF SUBCUTANEOUS MASS-ABDOMEN;  Surgeon: Sean Roup, MD;  Location: [place];  Service: General;  Laterality: N/A;  . LEFT HEART CATH AND CORONARY ANGIOGRAPHY N/A 03/14/2019   Procedure: LEFT HEART CATH AND CORONARY ANGIOGRAPHY;  Surgeon: Sean Crome, MD;  Location: [place] [facility];  Service: Cardiovascular;  Laterality: N/A;  . LUMBAR LAMINECTOMY/DECOMPRESSION MICRODISCECTOMY N/A 09/27/2017   Procedure: LAMINECTOMY LUMBAR THREE- LUMBAR FOUR, LUMBAR FOUR- LUMBAR FIVE;  Surgeon: Sean Pall, MD;  Location: [place];  Service: Neurosurgery;  Laterality: N/A;  . RADIOACTIVE PROSTATE SEED IMPLANTS  1998  . ROTATOR CUFF REPAIR Left approx. 2010  . TEE WITHOUT CARDIOVERSION N/A 05/27/2019   Procedure: TRANSESOPHAGEAL ECHOCARDIOGRAM (TEE);  Surgeon: Sean Isaac, MD;  Location: [place];  Service: Open Heart Surgery;  Laterality: N/A;  . TOTAL KNEE ARTHROPLASTY Left  08/06/2014   Procedure: LEFT TOTAL KNEE ARTHROPLASTY;  Surgeon: Sean Day, MD;  Location: [facility];  Service: Orthopedics;  Laterality: Left;    Current Medications: Current Meds  Medication Sig  . allopurinol (ZYLOPRIM) 300 MG tablet Take 300 mg by mouth daily.  Marland Kitchen aspirin EC 81 MG EC tablet Take 1 tablet (81 mg total) by mouth daily.  Marland Kitchen atorvastatin (LIPITOR) 40 MG tablet Take 1 tablet (40 mg total) by mouth daily at 6 PM.  . ELIQUIS 5 MG TABS tablet TAKE 1 TABLET BY MOUTH  TWICE DAILY  . furosemide (LASIX) 20 MG tablet TAKE 1 TABLET BY MOUTH  DAILY  . losartan (COZAAR) 100 MG tablet Take 50 mg by mouth daily.  . metFORMIN (GLUCOPHAGE) 1000 MG tablet Take 500 mg by mouth daily with breakfast.  . metoprolol tartrate (LOPRESSOR) 25 MG tablet Take 1 tablet (25 mg total) by mouth 2 (two) times daily.  . Multiple Vitamin (MULTIVITAMIN WITH MINERALS) TABS tablet  Take 1 tablet by mouth daily. Centrum Silver  . Multiple Vitamins-Minerals (PRESERVISION AREDS 2) CAPS Take 1 capsule by mouth 2 (two) times daily.   . ondansetron (ZOFRAN ODT) 4 MG disintegrating tablet Take 1 tablet (4 mg total) by mouth every 8 (eight) hours as needed for nausea or vomiting.  Marland Kitchen oxyCODONE-acetaminophen (PERCOCET) 5-325 MG tablet Take 1 tablet by mouth every 4 (four) hours as needed for severe pain.  . tamsulosin (FLOMAX) 0.4 MG CAPS capsule Take 1 capsule (0.4 mg total) by mouth daily.     Allergies:   Lisinopril, Other, Sulfa drugs cross reactors, Contrast media [iodinated diagnostic agents], Doxycycline, Ioxaglate, and Tetracyclines & related   Social History   Socioeconomic History  . Marital status: Married    Spouse name: Not on file  . Number of children: Not on file  . Years of education: Not on file  . Highest education level: Not on file  Occupational History  . Not on file  Tobacco Use  . Smoking status: Former Smoker    Years: 6.00    Types: Cigarettes    Quit date: 05/15/1973    Years since  quitting: 47.1  . Smokeless tobacco: Never Used  Vaping Use  . Vaping Use: Never used  Substance and Sexual Activity  . Alcohol use: Yes    Comment: "2 cocktails twice a week"  . Drug use: Never  . Sexual activity: Not on file  Other Topics Concern  . Not on file  Social History Narrative  . Not on file   Social Determinants of Health   Financial Resource Strain: Low Risk   . Difficulty of Paying Living Expenses: Not very hard  Food Insecurity: No Food Insecurity  . Worried About Charity fundraiser in the Last Year: Never true  . Ran Out of Food in the Last Year: Never true  Transportation Needs: No Transportation Needs  . Lack of Transportation (Medical): No  . Lack of Transportation (Non-Medical): No  Physical Activity: Not on file  Stress: No Stress Concern Present  . Feeling of Stress : Not at all  Social Connections: Moderately Integrated  . Frequency of Communication with Friends and Family: Twice a week  . Frequency of Social Gatherings with Friends and Family: Once a week  . Attends Religious Services: Never  . Active Member of Clubs or Organizations: Yes  . Attends Archivist Meetings: More than 4 times per year  . Marital Status: Married     Family History: The patient's family history includes Cancer in his mother; Colon cancer in his mother; Dementia in his mother; Heart attack in his father and sister; Stroke in his sister. There is no history of Esophageal cancer, Rectal cancer, or Stomach cancer.  ROS:   Please see the history of present illness.    Review of Systems  Constitutional: Positive for malaise/fatigue. Negative for weight loss.  HENT: Negative for nosebleeds.   Eyes: Negative for blurred vision and redness.  Respiratory: Positive for shortness of breath.   Cardiovascular: Positive for chest pain and leg swelling. Negative for palpitations, orthopnea, claudication and PND.  Gastrointestinal: Negative for melena, nausea and vomiting.   Genitourinary: Positive for hematuria.  Musculoskeletal: Positive for myalgias.  Neurological: Negative for dizziness and loss of consciousness.  Endo/Heme/Allergies: Negative for polydipsia.  Psychiatric/Behavioral: Negative for substance abuse.    EKGs/Labs/Other Studies Reviewed:    The following studies were reviewed today: TTE 2019-08-11: IMPRESSIONS  1. Left ventricular ejection fraction, by  estimation, is 55 to 60%. The  left ventricle has normal function. There is mild concentric left  ventricular hypertrophy. Left ventricular diastolic function could not be  evaluated. There is the interventricular  septum is flattened in systole and diastole, consistent with right  ventricular pressure and volume overload.  2. Left atrial size was severely dilated.  3. Right atrial size was severely dilated.  4. Mild to moderate mitral valve regurgitation.  5. Tricuspid valve regurgitation is moderate.  6. There is mildly elevated pulmonary artery systolic pressure. The  estimated right ventricular systolic pressure is 99.3 mmHg.   CABG 05/2019 PROCEDURE PERFORMED:1Coronary artery bypass grafting x3 with the left internal mammary to the left anterior descending coronary artery, reverse saphenous vein graft to the diagonal coronary artery, reverse saphenous vein graft to the distal right  coronary artery with right greater saphenous thigh endoscopic vein harvesting 2. Placement of Atriclip- Left Atrial Clip 45 mm  3. Sampling of left internal mammary lymph node 4. 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   LEFT HEART CATH AND CORONARY ANGIOGRAPHY10/30/2020  Conclusion   Moderately severe three-vessel coronary disease.  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 2 small obtuse marginal branch is supplied on the left lateral wall.  40% proximal LAD with 60 to 70% diffuse mid to distal LAD. First diagonal  contains eccentric 50 to 70% narrowing.  Right coronary is diffusely diseased with mid 60 to 65% stenosis. Distal 75% stenosis before small PDA. 99% stenosis in the continuation of the right coronary before the second PLA. The acute marginal branch of the right coronary has multifocal 95% stenoses.  Low normal LVEF approximately 50% with normal EDP.  RECOMMENDATIONS:   Aggressive risk preventive therapy with LDL target less than 55.  Add long-acting nitrate therapy to improve exertional dyspnea and chest tightness.  If unacceptable symptoms, would consider multivessel coronary bypass grafting. In absence of high-grade proximal CAD, I feel a measured approach attempting to control symptoms with medications is a reasonable first option.     Recent Labs: 09/30/2019: NT-Pro BNP 1,513 05/01/2020: ALT 39; Platelets 147 05/19/2020: BUN 22; Creatinine, Ser 1.10; Hemoglobin 14.6; Potassium 4.1; Sodium 142  Recent Lipid Panel    Component Value Date/Time   CHOL 120 09/30/2019 1107   TRIG 91 09/30/2019 1107   HDL 41 09/30/2019 1107   CHOLHDL 2.9 09/30/2019 1107   LDLCALC 61 09/30/2019 1107     Risk Assessment/Calculations:    CHA2DS2-VASc Score = 5  This indicates a 7.2% annual risk of stroke. The patient's score is based upon: CHF History: No HTN History: Yes Diabetes History: Yes Stroke History: No Vascular Disease History: Yes Age Score: 2 Gender Score: 0     Physical Exam:    VS:  BP (!) 144/88   Pulse (!) 59   Ht 6' (1.829 m)   Wt 221 lb (100.2 kg)   SpO2 98%   BMI 29.97 kg/m     Wt Readings from Last 3 Encounters:  07/01/20 221 lb (100.2 kg)  06/08/20 219 lb (99.3 kg)  05/19/20 222 lb 6.4 oz (100.9 kg)     GEN:  Well nourished, well developed in no acute distress HEENT: Normal NECK: No JVD; No carotid bruits CARDIAC: Irregular, no murmurs, rubs, gallops RESPIRATORY:  Clear to auscultation without rales, wheezing or rhonchi  ABDOMEN: Soft,  non-tender, non-distended MUSCULOSKELETAL:  Trace pitting edema to the mid-shin; No deformity  SKIN: Warm  and dry NEUROLOGIC:  Alert and oriented x 3 PSYCHIATRIC:  Normal affect   ASSESSMENT:    1. Essential hypertension   2. Chronic anticoagulation   3. SOB (shortness of breath)   4. Bradycardia    PLAN:    In order of problems listed above:  #DOE: #Fatigue: Patient with worsening SOB and fatigue over the past several weeks of unclear etiology. May be relative to mild volume overload given recent surgery with IVF administration as patient has trace LE edema which is new. Also with resting HR in 50s so symptomatic bradycardia is also a concern. Blood pressures also notably high at home which may be contributing but reliability of home blood pressure cuff is a concern. Will pursue work-up as below. -Check 7 Benitez zio monitor to assess for possible symptomatic bradycardia -Check BMET, CBC (given hematuria), and BNP today -Consider lasix pending BNP -Will have patient follow-up in HTN clinic to compare his blood pressure cuff to the reading in clinic with plans to adjust antihypertensives as needed -If above work-up is negative, will obtain TTE/lexiscan for further work-up  #Multivessel CAD s/p CABG: S/p ACB in 05/2019. Now with worsening DOE as above.  -Work-up DOE as above -Continue ASA 81mg  daily -Continue lipitor 40mg  daily -Continue lopressor 25mg  BID; may need to decrease pending HR on monitor -Continue losartan 100mg  daily  #Longstanding Persistent AF: CHADs-vasc 5. In Afib with HR 59 today. -Continue apixaban 5mg  BID -Metop as above  #HTN: Elevated at home but concerned about reliability of home BP cuff. -Send to HTN clinic as above to compare cuff readings -Continue metop and losartan 100mg  for now -Adjust medications as needed -Emphasized importance of low Na diet  #HLD: -Continue atorvastatin as above  #Hematuria: Developed from passing kidney stone. -Check  CBC today -Follow-up with Urology as scheduled  #Lung Nodule: Needs repeat CT 02/2021        Medication Adjustments/Labs and Tests Ordered: Current medicines are reviewed at length with the patient today.  Concerns regarding medicines are outlined above.  Orders Placed This Encounter  Procedures  . Basic metabolic panel  . Magnesium  . Pro b natriuretic peptide (BNP)  . CBC  . AMB Referral to [facility] Pharm[facility]  . LONG TERM MONITOR (3-14 DAYS)  . EKG 12-Lead   No orders of the defined types were placed in this encounter.   Patient Instructions   Medication Instructions: Your physician recommends that you continue on your current medications as directed. Please refer to the Current Medication list given to you today. *If you need a refill on your cardiac medications before your next appointment, please call your pharmacy*   Lab Work: Today: BMet, Magnesium, BNP, CBC If you have labs (blood work) drawn today and your tests are completely normal, you will receive your results only by: Marland Kitchen MyChart Message (if you have MyChart) OR . A paper copy in the mail If you have any lab test that is abnormal or we need to change your treatment, we will call you to review the results.   Testing/Procedures: Your Physician recommended that you wear a monitor for 7 days   Follow-Up:  Your physician recommends that you schedule a follow-up appointment next avail with hypertension  Clinic.   At [facility], you and your health needs are our priority.  As part of our continuing mission to provide you with exceptional heart care, we have created designated Provider Care Teams.  These Care Teams include your primary Cardiologist (physician) and Advanced  Practice Providers (APPs -  Physician Assistants and Nurse Practitioners) who all work together to provide you with the care you need, when you need it.  We recommend signing up for the patient portal called "MyChart".  Sign up information is  provided on this After Visit Summary.  MyChart is used to connect with patients for Virtual Visits (Telemedicine).  Patients are able to view lab/test results, encounter notes, upcoming appointments, etc.  Non-urgent messages can be sent to your provider as well.   To learn more about what you can do with MyChart, go to [email or web address].    Your next appointment:   3 week(s)  The format for your next appointment:   In Person  Provider:   You may see Gwyndolyn Kaufman, MD or one of the following Advanced Practice Providers on your designated Care Team:    Richardson Dopp, PA-C  [place], [place]    Other Instructions   Low-Sodium Eating Plan Sodium, which is an element that makes up salt, helps you maintain a healthy balance of fluids in your body. Too much sodium can increase your blood pressure and cause fluid and waste to be held in your body. Your health care provider or dietitian may recommend following this plan if you have high blood pressure (hypertension), kidney disease, liver disease, or heart failure. Eating less sodium can help lower your blood pressure, reduce swelling, and protect your heart, liver, and kidneys. What are tips for following this plan? Reading food labels  The Nutrition Facts label lists the amount of sodium in one serving of the food. If you eat more than one serving, you must multiply the listed amount of sodium by the number of servings.  Choose foods with less than 140 mg of sodium per serving.  Avoid foods with 300 mg of sodium or more per serving. Shopping  Look for lower-sodium products, often labeled as "low-sodium" or "no salt added."  Always check the sodium content, even if foods are labeled as "unsalted" or "no salt added."  Buy fresh foods. ? Avoid canned foods and pre-made or frozen meals. ? Avoid canned, cured, or processed meats.  Buy breads that have less than 80 mg of sodium per slice.   Cooking  Eat more home-cooked food  and less restaurant, buffet, and fast food.  Avoid adding salt when cooking. Use salt-free seasonings or herbs instead of table salt or sea salt. Check with your health care provider or pharmacist before using salt substitutes.  Cook with plant-based oils, such as canola, sunflower, or olive oil.   Meal planning  When eating at a restaurant, ask that your food be prepared with less salt or no salt, if possible. Avoid dishes labeled as brined, pickled, cured, smoked, or made with soy sauce, miso, or teriyaki sauce.  Avoid foods that contain MSG (monosodium glutamate). MSG is sometimes added to [place] food, bouillon, and some canned foods.  Make meals that can be grilled, baked, poached, roasted, or steamed. These are generally made with less sodium. General information Most people on this plan should limit their sodium intake to 1,500-2,000 mg (milligrams) of sodium each Benitez. What foods should I eat? Fruits Fresh, frozen, or canned fruit. Fruit juice. Vegetables Fresh or frozen vegetables. "No salt added" canned vegetables. "No salt added" tomato sauce and paste. Low-sodium or reduced-sodium tomato and vegetable juice. Grains Low-sodium cereals, including oats, puffed wheat and rice, and shredded wheat. Low-sodium crackers. Unsalted rice. Unsalted pasta. Low-sodium bread. Whole-grain breads and whole-grain pasta.  Meats and other proteins Fresh or frozen (no salt added) meat, poultry, seafood, and fish. Low-sodium canned tuna and salmon. Unsalted nuts. Dried peas, beans, and lentils without added salt. Unsalted canned beans. Eggs. Unsalted nut butters. Dairy Milk. Soy milk. Cheese that is naturally low in sodium, such as ricotta cheese, fresh mozzarella, or Swiss cheese. Low-sodium or reduced-sodium cheese. Cream cheese. Yogurt. Seasonings and condiments Fresh and dried herbs and spices. Salt-free seasonings. Low-sodium mustard and ketchup. Sodium-free salad dressing. Sodium-free light  mayonnaise. Fresh or refrigerated horseradish. Lemon juice. Vinegar. Other foods Homemade, reduced-sodium, or low-sodium soups. Unsalted popcorn and pretzels. Low-salt or salt-free chips. The items listed above may not be a complete list of foods and beverages you can eat. Contact a dietitian for more information. What foods should I avoid? Vegetables Sauerkraut, pickled vegetables, and relishes. Olives. [place] fries. Onion rings. Regular canned vegetables (not low-sodium or reduced-sodium). Regular canned tomato sauce and paste (not low-sodium or reduced-sodium). Regular tomato and vegetable juice (not low-sodium or reduced-sodium). Frozen vegetables in sauces. Grains Instant hot cereals. Bread stuffing, pancake, and biscuit mixes. Croutons. Seasoned rice or pasta mixes. Noodle soup cups. Boxed or frozen macaroni and cheese. Regular salted crackers. Self-rising flour. Meats and other proteins Meat or fish that is salted, canned, smoked, spiced, or pickled. Precooked or cured meat, such as sausages or meat loaves. Berniece Salines. Ham. Pepperoni. Hot dogs. Corned beef. Chipped beef. Salt pork. Jerky. Pickled herring. Anchovies and sardines. Regular canned tuna. Salted nuts. Dairy Processed cheese and cheese spreads. Hard cheeses. Cheese curds. Blue cheese. Feta cheese. String cheese. Regular cottage cheese. Buttermilk. Canned milk. Fats and oils Salted butter. Regular margarine. Ghee. Bacon fat. Seasonings and condiments Onion salt, garlic salt, seasoned salt, table salt, and sea salt. Canned and packaged gravies. Worcestershire sauce. Tartar sauce. Barbecue sauce. Teriyaki sauce. Soy sauce, including reduced-sodium. Steak sauce. Fish sauce. Oyster sauce. Cocktail sauce. Horseradish that you find on the shelf. Regular ketchup and mustard. Meat flavorings and tenderizers. Bouillon cubes. Hot sauce. Pre-made or packaged marinades. Pre-made or packaged taco seasonings. Relishes. Regular salad dressings.  Salsa. Other foods Salted popcorn and pretzels. Corn chips and puffs. Potato and tortilla chips. Canned or dried soups. Pizza. Frozen entrees and pot pies. The items listed above may not be a complete list of foods and beverages you should avoid. Contact a dietitian for more information. Summary  Eating less sodium can help lower your blood pressure, reduce swelling, and protect your heart, liver, and kidneys.  Most people on this plan should limit their sodium intake to 1,500-2,000 mg (milligrams) of sodium each Benitez.  Canned, boxed, and frozen foods are high in sodium. Restaurant foods, fast foods, and pizza are also very high in sodium. You also get sodium by adding salt to food.  Try to cook at home, eat more fresh fruits and vegetables, and eat less fast food and canned, processed, or prepared foods. This information is not intended to replace advice given to you by your health care provider. Make sure you discuss any questions you have with your health care provider. Document Revised: 06/06/2019 Document Reviewed: 04/02/2019 Elsevier Patient Education  2021 [place].      Signed, Freada Bergeron, MD  07/01/2020 12:33 PM    [place]

## 2020-07-01 NOTE — Patient Instructions (Signed)
Medication Instructions: Your physician recommends that you continue on your current medications as directed. Please refer to the Current Medication list given to you today. *If you need a refill on your cardiac medications before your next appointment, please call your pharmacy*   Lab Work: Today: BMet, Magnesium, BNP, CBC If you have labs (blood work) drawn today and your tests are completely normal, you will receive your results only by: Marland Kitchen MyChart Message (if you have MyChart) OR . A paper copy in the mail If you have any lab test that is abnormal or we need to change your treatment, we will call you to review the results.   Testing/Procedures: Your Physician recommended that you wear a monitor for 7 days   Follow-Up:  Your physician recommends that you schedule a follow-up appointment next avail with hypertension  Clinic.   At [facility], you and your health needs are our priority.  As part of our continuing mission to provide you with exceptional heart care, we have created designated Provider Care Teams.  These Care Teams include your primary Cardiologist (physician) and Advanced Practice Providers (APPs -  Physician Assistants and Nurse Practitioners) who all work together to provide you with the care you need, when you need it.  We recommend signing up for the patient portal called "MyChart".  Sign up information is provided on this After Visit Summary.  MyChart is used to connect with patients for Virtual Visits (Telemedicine).  Patients are able to view lab/test results, encounter notes, upcoming appointments, etc.  Non-urgent messages can be sent to your provider as well.   To learn more about what you can do with MyChart, go to [email or web address].    Your next appointment:   3 week(s)  The format for your next appointment:   In Person  Provider:   You may see Gwyndolyn Kaufman, MD or one of the following Advanced Practice Providers on your designated Care Team:     Richardson Dopp, PA-C  [place], [place]    Other Instructions   Low-Sodium Eating Plan Sodium, which is an element that makes up salt, helps you maintain a healthy balance of fluids in your body. Too much sodium can increase your blood pressure and cause fluid and waste to be held in your body. Your health care provider or dietitian may recommend following this plan if you have high blood pressure (hypertension), kidney disease, liver disease, or heart failure. Eating less sodium can help lower your blood pressure, reduce swelling, and protect your heart, liver, and kidneys. What are tips for following this plan? Reading food labels  The Nutrition Facts label lists the amount of sodium in one serving of the food. If you eat more than one serving, you must multiply the listed amount of sodium by the number of servings.  Choose foods with less than 140 mg of sodium per serving.  Avoid foods with 300 mg of sodium or more per serving. Shopping  Look for lower-sodium products, often labeled as "low-sodium" or "no salt added."  Always check the sodium content, even if foods are labeled as "unsalted" or "no salt added."  Buy fresh foods. ? Avoid canned foods and pre-made or frozen meals. ? Avoid canned, cured, or processed meats.  Buy breads that have less than 80 mg of sodium per slice.   Cooking  Eat more home-cooked food and less restaurant, buffet, and fast food.  Avoid adding salt when cooking. Use salt-free seasonings or herbs instead of table salt or  sea salt. Check with your health care provider or pharmacist before using salt substitutes.  Cook with plant-based oils, such as canola, sunflower, or olive oil.   Meal planning  When eating at a restaurant, ask that your food be prepared with less salt or no salt, if possible. Avoid dishes labeled as brined, pickled, cured, smoked, or made with soy sauce, miso, or teriyaki sauce.  Avoid foods that contain MSG (monosodium  glutamate). MSG is sometimes added to [place] food, bouillon, and some canned foods.  Make meals that can be grilled, baked, poached, roasted, or steamed. These are generally made with less sodium. General information Most people on this plan should limit their sodium intake to 1,500-2,000 mg (milligrams) of sodium each day. What foods should I eat? Fruits Fresh, frozen, or canned fruit. Fruit juice. Vegetables Fresh or frozen vegetables. "No salt added" canned vegetables. "No salt added" tomato sauce and paste. Low-sodium or reduced-sodium tomato and vegetable juice. Grains Low-sodium cereals, including oats, puffed wheat and rice, and shredded wheat. Low-sodium crackers. Unsalted rice. Unsalted pasta. Low-sodium bread. Whole-grain breads and whole-grain pasta. Meats and other proteins Fresh or frozen (no salt added) meat, poultry, seafood, and fish. Low-sodium canned tuna and salmon. Unsalted nuts. Dried peas, beans, and lentils without added salt. Unsalted canned beans. Eggs. Unsalted nut butters. Dairy Milk. Soy milk. Cheese that is naturally low in sodium, such as ricotta cheese, fresh mozzarella, or Swiss cheese. Low-sodium or reduced-sodium cheese. Cream cheese. Yogurt. Seasonings and condiments Fresh and dried herbs and spices. Salt-free seasonings. Low-sodium mustard and ketchup. Sodium-free salad dressing. Sodium-free light mayonnaise. Fresh or refrigerated horseradish. Lemon juice. Vinegar. Other foods Homemade, reduced-sodium, or low-sodium soups. Unsalted popcorn and pretzels. Low-salt or salt-free chips. The items listed above may not be a complete list of foods and beverages you can eat. Contact a dietitian for more information. What foods should I avoid? Vegetables Sauerkraut, pickled vegetables, and relishes. Olives. [place] fries. Onion rings. Regular canned vegetables (not low-sodium or reduced-sodium). Regular canned tomato sauce and paste (not low-sodium or reduced-sodium).  Regular tomato and vegetable juice (not low-sodium or reduced-sodium). Frozen vegetables in sauces. Grains Instant hot cereals. Bread stuffing, pancake, and biscuit mixes. Croutons. Seasoned rice or pasta mixes. Noodle soup cups. Boxed or frozen macaroni and cheese. Regular salted crackers. Self-rising flour. Meats and other proteins Meat or fish that is salted, canned, smoked, spiced, or pickled. Precooked or cured meat, such as sausages or meat loaves. [place] Salines. Ham. Pepperoni. Hot dogs. Corned beef. Chipped beef. Salt pork. Jerky. Pickled herring. Anchovies and sardines. Regular canned tuna. Salted nuts. Dairy Processed cheese and cheese spreads. Hard cheeses. Cheese curds. Blue cheese. Feta cheese. String cheese. Regular cottage cheese. Buttermilk. Canned milk. Fats and oils Salted butter. Regular margarine. Ghee. Bacon fat. Seasonings and condiments Onion salt, garlic salt, seasoned salt, table salt, and sea salt. Canned and packaged gravies. Worcestershire sauce. Tartar sauce. Barbecue sauce. Teriyaki sauce. Soy sauce, including reduced-sodium. Steak sauce. Fish sauce. Oyster sauce. Cocktail sauce. Horseradish that you find on the shelf. Regular ketchup and mustard. Meat flavorings and tenderizers. Bouillon cubes. Hot sauce. Pre-made or packaged marinades. Pre-made or packaged taco seasonings. Relishes. Regular salad dressings. Salsa. Other foods Salted popcorn and pretzels. Corn chips and puffs. Potato and tortilla chips. Canned or dried soups. Pizza. Frozen entrees and pot pies. The items listed above may not be a complete list of foods and beverages you should avoid. Contact a dietitian for more information. Summary  Eating less sodium can help  lower your blood pressure, reduce swelling, and protect your heart, liver, and kidneys.  Most people on this plan should limit their sodium intake to 1,500-2,000 mg (milligrams) of sodium each day.  Canned, boxed, and frozen foods are high in sodium.  Restaurant foods, fast foods, and pizza are also very high in sodium. You also get sodium by adding salt to food.  Try to cook at home, eat more fresh fruits and vegetables, and eat less fast food and canned, processed, or prepared foods. This information is not intended to replace advice given to you by your health care provider. Make sure you discuss any questions you have with your health care provider. Document Revised: 06/06/2019 Document Reviewed: 04/02/2019 Elsevier Patient Education  2021 [employer].

## 2020-07-02 ENCOUNTER — Telehealth: Payer: Self-pay

## 2020-07-02 NOTE — Telephone Encounter (Signed)
-----   Message from Freada Bergeron, MD sent at 07/02/2020  9:04 AM EST ----- His labs show that his fluid levels are elevated which is likely why he has been feeling more short of breath with activity and tired. Can we start him on lasix 40mg  daily for 5 days and then 20mg  daily thereafter until he is seen back in clinic in early March.

## 2020-07-02 NOTE — Telephone Encounter (Signed)
RN spoke with patient and explained the results and the plan per Dr. Johney Frame. Pt agreed to increasing the lasix to 40 mg for 5 days and then back to 20 mg until he is seen in early March.  Pt stated he had enough medication for this change.  Pt stated he didn't have any other concerns at this time.

## 2020-07-04 LAB — CBC
Hematocrit: 42 % (ref 37.5–51.0)
Hemoglobin: 13.8 g/dL (ref 13.0–17.7)
MCH: 29.8 pg (ref 26.6–33.0)
MCHC: 32.9 g/dL (ref 31.5–35.7)
MCV: 91 fL (ref 79–97)
Platelets: 157 10*3/uL (ref 150–450)
RBC: 4.63 x10E6/uL (ref 4.14–5.80)
RDW: 13.6 % (ref 11.6–15.4)
WBC: 13.5 10*3/uL — ABNORMAL HIGH (ref 3.4–10.8)

## 2020-07-04 LAB — BASIC METABOLIC PANEL
BUN/Creatinine Ratio: 18 (ref 10–24)
BUN: 24 mg/dL (ref 8–27)
CO2: 19 mmol/L — ABNORMAL LOW (ref 20–29)
Calcium: 9.6 mg/dL (ref 8.6–10.2)
Chloride: 104 mmol/L (ref 96–106)
Creatinine, Ser: 1.31 mg/dL — ABNORMAL HIGH (ref 0.76–1.27)
GFR calc Af Amer: 61 mL/min/{1.73_m2} (ref >59)
GFR calc non Af Amer: 53 mL/min/{1.73_m2} — ABNORMAL LOW (ref >59)
Glucose: 120 mg/dL — ABNORMAL HIGH (ref 65–99)
Potassium: 4.6 mmol/L (ref 3.5–5.2)
Sodium: 140 mmol/L (ref 134–144)

## 2020-07-04 LAB — PRO B NATRIURETIC PEPTIDE: NT-Pro BNP: 2655 pg/mL — ABNORMAL HIGH (ref 0–486)

## 2020-07-04 LAB — MAGNESIUM: Magnesium: 2.6 mg/dL — ABNORMAL HIGH (ref 1.6–2.3)

## 2020-07-06 DIAGNOSIS — Z7901 Long term (current) use of anticoagulants: Secondary | ICD-10-CM

## 2020-07-06 DIAGNOSIS — R0602 Shortness of breath: Secondary | ICD-10-CM | POA: Diagnosis not present

## 2020-07-06 DIAGNOSIS — I10 Essential (primary) hypertension: Secondary | ICD-10-CM

## 2020-07-06 DIAGNOSIS — R001 Bradycardia, unspecified: Secondary | ICD-10-CM

## 2020-07-06 DIAGNOSIS — I4891 Unspecified atrial fibrillation: Secondary | ICD-10-CM

## 2020-07-12 NOTE — Progress Notes (Signed)
Patient ID: TYREKE KAESER                 DOB: [birth year]                      MRN: [id number]     HPI: NICOLAI LABONTE is a 75 y.o. male referred by Dr. Johney Frame to HTN clinic. PMH is significant for CAD s/p CABG x3 with LA clipping on 05/27/19, chronic atrial fibrillation that has recurred after multiple DCCV on Eliquis, OSA on CPAP, HTN and HLD.   Pt last seen by Dr. Johney Frame on 07/01/20 at which BP was elevated at 144/88. Pt reported worsening SOB on exertion associated with chest tightness, more confused/out of it and fatigue requiring daytime napping for the past several weeks. He was off of Lasix for several months and noticed new LE swelling. Symptoms possibly related to mild volume overload given recent surgery with IVF administration. Reports home SBP 170-180s and DBP 100s. Follow-up labs showed increase in fluid levels: BUN 26, Scr 1.36, and NT-ProBNP 2655. Pt restarted on Lasix 40 mg daily x5 days, then 20 mg daily and referred to HTN clinic. Additionally, resting HR in 50s - possible symptomatic bradycardia, therefore patient given 7-day zio monitor to assess.  Patient presents today in good spirits. Reports taking losartan 50 mg BID (0.5 tab of losartan 100 mg) instead of 50 mg daily as prescribed. Reports uncertainty taking metoprolol tartrate twice daily. Reports medication adherence with Lasix 20 mg daily. Reports using a pill box and follows medication directions from AVS at visit with [name] on 06/08/20. Reports headaches. Reports blurred/distorted vision secondary to macular degeneration. Reports LE swelling significantly improved since restarting Lasix. Denies SOB at rest and pt unable to assess SOB on exertion as he has not been exercising for the past week. Pt brought BP monitor which is ~77 years old which measured BP 149/98 and HR 75 in clinic today. Reports checking BP 15-20 minutes after morning meds and unsure if BP monitor works properly. Home BP ranges 135-150s/70-110s with highs of  171/107, 171/98, and 176/110. Discussed current diet and exercise regimen below.   Current HTN meds: losartan 50 mg daily (taking BID), metoprolol tartrate 25 mg BID, Lasix 20 mg daily (fluid) Previously tried: lisinopril (cough) BP goal: <130/80 mmHg  Family History: Cancer in his mother; Colon cancer in his mother; Dementia in his mother; Heart attack in his father and sister; Stroke in his sister  Social History: former smoker (quit 1975)   Diet:  -Breakfast: cereal with fruit and skim milk; scrambled eggs with 15 grain wheat toast; sometimes bacon -Lunch: sandwich w/chicken or [place] and cheese; baked potato chips  -Dinner: white fish w/steam broccoli; chicken, homemade [place] soup; limits red meat; cut back salt intake  -Snacks: not really but fruits  -Drinks: water, soft drink w/zero sugar  Exercise:  Previously walking 40 minutes 2-3 times; does yard work   Home BP readings: 139/87, 170/106, 165/110, 140/85, 138/83, 143/101, 111/78, 146/96, 135/98, 124/84, 159/113, 141/103, 171/107, 171/98, 176/110  Wt Readings from Last 3 Encounters:  07/01/20 221 lb (100.2 kg)  06/08/20 219 lb (99.3 kg)  05/19/20 222 lb 6.4 oz (100.9 kg)   BP Readings from Last 3 Encounters:  07/13/20 (!) 142/80  07/01/20 (!) 144/88  06/08/20 120/80   Pulse Readings from Last 3 Encounters:  07/13/20 68  07/01/20 (!) 59  06/08/20 (!) 56    Renal function: Estimated Creatinine Clearance: 58.8 mL/min (A) (  by C-G formula based on SCr of 1.31 mg/dL (H)).  Past Medical History:  Diagnosis Date  . Anticoagulant long-term use    eliquis/ asa --- mangaed by cardiology  . Coronary artery disease cardiologist--- Truitt Merle PA   s/p cardiac cath 03-14-2019 severe 3V ;  05-27-2019  s/p  CABG x3/ clipping atrial appendage  . Depression   . DOE (dyspnea on exertion)    05-04-2020  per pt is is much better since CABG 01/ 2021,  recovers quickly after going up flight stairs  . Feeling of incomplete  bladder emptying   . Full dentures   . Heart murmur   . History of prostate cancer 1998   urologist--- dr Junious Silk---  s/p radioactive seed implants 1998  . History of urethral stricture    s/p  dilatation  . Hypertension   . Incisional hernia    per pt was told by dr gerhardt, had incisional hernia at CABG incision  . Macular degeneration of both eyes   . Nephrolithiasis    right side nonobstructive per CT 05-01-2020,  also had 2 right ureter stones and on 05-04-2020 pt stated he passed 2 stones 05-03-2020  . OSA on CPAP    uses CPAP-settings 5  . Osteoarthritis (arthritis due to wear and tear of joints)   . Persistent atrial fibrillation [facility])    cardiologist-- Remer Macho PA  . Slow urinary stream   . Type 2 diabetes mellitus ([place])    followed by pcp  (05-04-2020  per pt fasting blood sugar average 98-120)  . 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 Wears hearing aid in both ears     Current Outpatient Medications on File Prior to Visit  Medication Sig Dispense Refill  . allopurinol (ZYLOPRIM) 300 MG tablet Take 300 mg by mouth daily.    Marland Kitchen aspirin EC 81 MG EC tablet Take 1 tablet (81 mg total) by mouth daily. 30 tablet 0  . atorvastatin (LIPITOR) 40 MG tablet Take 1 tablet (40 mg total) by mouth daily at 6 PM. 90 tablet 2  . ELIQUIS 5 MG TABS tablet TAKE 1 TABLET BY MOUTH  TWICE DAILY 180 tablet 3  . furosemide (LASIX) 20 MG tablet TAKE 1 TABLET BY MOUTH  DAILY 90 tablet 3  . losartan (COZAAR) 100 MG tablet Take 50 mg by mouth daily.    . metFORMIN (GLUCOPHAGE) 1000 MG tablet Take 500 mg by mouth daily with breakfast.    . metoprolol tartrate (LOPRESSOR) 25 MG tablet Take 1 tablet (25 mg total) by mouth 2 (two) times daily. 180 tablet 2  . Multiple Vitamin (MULTIVITAMIN WITH MINERALS) TABS tablet Take 1 tablet by mouth daily. Centrum Silver    . Multiple  Vitamins-Minerals (PRESERVISION AREDS 2) CAPS Take 1 capsule by mouth 2 (two) times daily.     . ondansetron (ZOFRAN ODT) 4 MG disintegrating tablet Take 1 tablet (4 mg total) by mouth every 8 (eight) hours as needed for nausea or vomiting. 20 tablet 0  . oxyCODONE-acetaminophen (PERCOCET) 5-325 MG tablet Take 1 tablet by mouth every 4 (four) hours as needed for severe pain. 20 tablet 0  . tamsulosin (FLOMAX) 0.4 MG CAPS capsule Take 1 capsule (0.4 mg total) by mouth daily. 10 capsule 0   No current facility-administered medications on file prior to visit.    Allergies  Allergen Reactions  .  Lisinopril Cough  . Other Other (See Comments)  . Sulfa Drugs Cross Reactors     CHILDHOOD UNSPECIFIED REACTION   . Contrast Media [Iodinated Diagnostic Agents] Rash  . Doxycycline Itching  . Ioxaglate Rash  . Tetracyclines & Related Rash   Assessment/Plan:  1. Hypertension - BP above goal <130/80 mmHg. Medication adherence appears fair. Pt currently taking losartan 50 mg BID instead of 50 mg daily as prescribed and unsure if taking metoprolol tartrate twice daily. Instructed patient to start taking 1 tablet of losartan 100 mg daily in the mornings and will start amlodipine 5 mg daily in the evenings. Counseled on LE swelling as a common side-effect and to call HTN clinic if swelling worsens. Patient verbalized understanding. Provided patient with updated AVS medication list to use when filling pill box. Additionally, discussed purchasing a new BP monitor as patient voiced concern with possibility of inaccurate readings, and educated to check BP daily at least 1 hour after medications. Patient verbalized understanding. Encouraged patient to aim for a diet full of vegetables, fruit and lean meats (chicken, [place], fish) and to limit salt intake by eating fresh or frozen vegetables (instead of canned), rinse canned vegetables prior to cooking and do not add any additional salt to meals. Encouraged patient to  exercise 20-30 minutes daily with the goal of 150 minutes per week. Patient verbalized understanding. Will check BMET today since restarting Lasix. Follow-up appointment scheduled in 4 weeks.   [name], PharmD, [facility] [address] [place], [place] [address] Phone: [phone]; Fax: [phone]

## 2020-07-13 ENCOUNTER — Other Ambulatory Visit: Payer: Self-pay

## 2020-07-13 ENCOUNTER — Encounter: Payer: Self-pay | Admitting: Pharmacist

## 2020-07-13 ENCOUNTER — Ambulatory Visit (INDEPENDENT_AMBULATORY_CARE_PROVIDER_SITE_OTHER): Payer: Medicare Other | Admitting: Pharmacist

## 2020-07-13 VITALS — BP 142/80 | HR 68

## 2020-07-13 DIAGNOSIS — I10 Essential (primary) hypertension: Secondary | ICD-10-CM | POA: Diagnosis not present

## 2020-07-13 MED ORDER — LOSARTAN POTASSIUM 100 MG PO TABS: 100.0000 mg | ORAL_TABLET | Freq: Every day | ORAL | 3 refills | Status: DC

## 2020-07-13 MED ORDER — AMLODIPINE BESYLATE 5 MG PO TABS: 5.0000 mg | ORAL_TABLET | Freq: Every day | ORAL | 3 refills | Status: DC

## 2020-07-13 NOTE — Patient Instructions (Addendum)
It was nice to see you today!  Your goal blood pressure is <130/80 mmHg. In clinic, your blood pressure was 142/80 mmHg.  Medication Changes: Begin taking amlodipine 5 mg daily - 1 tablet in the evening  Begin taking losartan 100 mg daily - 1 tablet in the morning  Continue taking metoprolol tartrate 25 mg twice daily - 1 tablet in the morning and evening   Continue Lasix 20 mg daily  Monitor blood pressure at home daily and keep a log (on your phone or piece of paper) to bring with you to your next visit. Write down date, time, blood pressure and pulse.  Keep up the good work with diet and exercise. Aim for a diet full of vegetables, fruit and lean meats (chicken, [place], fish). Try to limit salt intake by eating fresh or frozen vegetables (instead of canned), rinse canned vegetables prior to cooking and do not add any additional salt to meals.   Please give [place] a call at [phone] with any questions or concerns.

## 2020-07-14 LAB — BASIC METABOLIC PANEL
BUN/Creatinine Ratio: 19 (ref 10–24)
BUN: 24 mg/dL (ref 8–27)
CO2: 21 mmol/L (ref 20–29)
Calcium: 9.1 mg/dL (ref 8.6–10.2)
Chloride: 107 mmol/L — ABNORMAL HIGH (ref 96–106)
Creatinine, Ser: 1.24 mg/dL (ref 0.76–1.27)
Glucose: 105 mg/dL — ABNORMAL HIGH (ref 65–99)
Potassium: 4.8 mmol/L (ref 3.5–5.2)
Sodium: 143 mmol/L (ref 134–144)
eGFR: 60 mL/min/{1.73_m2} (ref >59)

## 2020-07-18 NOTE — Progress Notes (Signed)
Cardiology Office Note:    Date:  07/22/2020   ID:  [name], DOB Sep 03, 1945, MRN [id number]  PCP:  Leanna Battles, [place]  Cardiologist:  No primary care provider on file.  Advanced Practice Provider:  No care team member to display Electrophysiologist:  None    Referring MD: Leanna Battles, MD     History of Present Illness:    [name] is a 75 y.o. male with a hx of hx of CAD s/p CABG x3 with LA clipping on 05/27/19, chronic atrial fibrillation that has recurred after multiple DCCV initially on warfarin now on eliquis, OSA on CPAP, HTN and HLD who was previously followed by Doran Clay who now presents to clinic for follow-up.  During our last visit on 07/01/20, the patient was complaining of worsening DOE with associated chest tightness and fatigue. He was off lasix at that time and BNP came back elevated at 2655. We started him on lasix 40mg  daily and then 20mg  daily thereafter. A 7d zio was also placed. He followed up in HTN clinic and was instructed to take losartan 100mg  daily and start amlodipine 5mg  daily.  Cardiac monitor:  Patch Wear Time:  7 days and 1 hours (2022-02-22T07:32:26-0500 to 2022-03-01T08:42:52-0500)  1 run of Ventricular Tachycardia occurred lasting 8 beats with a max rate of 162 bpm (avg 139 bpm). Atrial Fibrillation occurred continuously (100% burden), ranging from 41-120 bpm (avg of 72 bpm). Isolated VEs were occasional (1.5%, 10751), VE Couplets  were rare (<1.0%, 267), and no VE Triplets were present. Ventricular Bigeminy and Trigeminy were present.   Patient states that he feels improved. Shortness of breath and DOE significantly improved from prior visit. LE edema resolved. Now taking lasix 20mg  daily. Blood pressures fairly well controlled at home. Has been walking and feels much less dyspneic. States he has bad right hip pain and may need hip replacement in the future.   Past Medical History:   Diagnosis Date  . Anticoagulant long-term use    eliquis/ asa --- mangaed by cardiology  . Coronary artery disease cardiologist--- Truitt Merle PA   s/p cardiac cath 03-14-2019 severe 3V ;  05-27-2019  s/p  CABG x3/ clipping atrial appendage  . Depression   . DOE (dyspnea on exertion)    05-04-2020  per pt is is much better since CABG 01/ 2021,  recovers quickly after going up flight stairs  . Feeling of incomplete bladder emptying   . Full dentures   . Heart murmur   . History of prostate cancer 1998   urologist--- dr Junious Silk---  s/p radioactive seed implants 1998  . History of urethral stricture    s/p  dilatation  . Hypertension   . Incisional hernia    per pt was told by dr gerhardt, had incisional hernia at CABG incision  . Macular degeneration of both eyes   . Nephrolithiasis    right side nonobstructive per CT 05-01-2020,  also had 2 right ureter stones and on 05-04-2020 pt stated he passed 2 stones 05-03-2020  . OSA on CPAP    uses CPAP-settings 5  . Osteoarthritis (arthritis due to wear and tear of joints)   . Persistent atrial fibrillation [facility])    cardiologist-- Remer Macho PA  . Slow urinary stream   . Type 2 diabetes mellitus ([place])    followed by pcp  (05-04-2020  per pt fasting blood sugar average 98-120)  . 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 Wears hearing aid in both ears     Past Surgical History:  Procedure Laterality Date  . CATARACT EXTRACTION W/ INTRAOCULAR LENS  IMPLANT, BILATERAL  2019  . CLIPPING OF ATRIAL APPENDAGE N/A 05/27/2019   Procedure: CLIPPING OF ATRIAL APPENDAGE - Using AtriCure Clip size 45MM;  Surgeon: Grace Isaac, MD;  Location: [place];  Service: Open Heart Surgery;  Laterality: N/A;  . CORONARY ARTERY BYPASS GRAFT N/A 05/27/2019   Procedure: CORONARY ARTERY BYPASS GRAFTING (CABG) x Three , using left  internal mammary artery and right leg greater saphenous vein harvested endoscopically LIMA to LAD, SVG to DIAG, SVG to RCA. Incision of left internal mammary lymph node Incision of Left Internal  Mediastinal Mass;  Surgeon: Grace Isaac, MD;  Location: [place];  Service: Open Heart Surgery;  Laterality: N/A;  . CYSTOSCOPY W/ RETROGRADES  04/04/2012   Procedure: CYSTOSCOPY WITH RETROGRADE PYELOGRAM;  Surgeon: Molli Hazard, MD;  Location: [facility];  Service: Urology;  Laterality: Bilateral;  . CYSTOSCOPY WITH RETROGRADE PYELOGRAM, URETEROSCOPY AND STENT PLACEMENT  04/04/2012   Procedure: CYSTOSCOPY WITH RETROGRADE PYELOGRAM, URETEROSCOPY AND STENT PLACEMENT;  Surgeon: Molli Hazard, MD;  Location: [facility];  Service: Urology;  Laterality: Right;  . EXCISION MASS ABDOMINAL N/A 05/19/2020   Procedure: EXCISION OF SUBCUTANEOUS MASS-ABDOMEN;  Surgeon: Ileana Roup, MD;  Location: [place];  Service: General;  Laterality: N/A;  . LEFT HEART CATH AND CORONARY ANGIOGRAPHY N/A 03/14/2019   Procedure: LEFT HEART CATH AND CORONARY ANGIOGRAPHY;  Surgeon: Belva Crome, MD;  Location: [place] [facility];  Service: Cardiovascular;  Laterality: N/A;  . LUMBAR LAMINECTOMY/DECOMPRESSION MICRODISCECTOMY N/A 09/27/2017   Procedure: LAMINECTOMY LUMBAR THREE- LUMBAR FOUR, LUMBAR FOUR- LUMBAR FIVE;  Surgeon: Ashok Pall, MD;  Location: [place];  Service: Neurosurgery;  Laterality: N/A;  . RADIOACTIVE PROSTATE SEED IMPLANTS  1998  . ROTATOR CUFF REPAIR Left approx. 2010  . TEE WITHOUT CARDIOVERSION N/A 05/27/2019   Procedure: TRANSESOPHAGEAL ECHOCARDIOGRAM (TEE);  Surgeon: Grace Isaac, MD;  Location: [place];  Service: Open Heart Surgery;  Laterality: N/A;  . TOTAL KNEE ARTHROPLASTY Left 08/06/2014   Procedure: LEFT TOTAL KNEE ARTHROPLASTY;  Surgeon: Susa Day, MD;  Location: [facility];  Service: Orthopedics;  Laterality: Left;    Current Medications: Current Meds   Medication Sig  . allopurinol (ZYLOPRIM) 300 MG tablet Take 300 mg by mouth daily.  Marland Kitchen amLODipine (NORVASC) 5 MG tablet Take 1 tablet (5 mg total) by mouth daily.  Marland Kitchen aspirin EC 81 MG EC tablet Take 1 tablet (81 mg total) by mouth daily.  Marland Kitchen atorvastatin (LIPITOR) 40 MG tablet Take 1 tablet (40 mg total) by mouth daily at 6 PM.  . ELIQUIS 5 MG TABS tablet TAKE 1 TABLET BY MOUTH  TWICE DAILY  . furosemide (LASIX) 20 MG tablet TAKE 1 TABLET BY MOUTH  DAILY  . HYDROcodone-acetaminophen (NORCO) 10-325 MG tablet Take 1 tablet by mouth 3 (three) times daily as needed.  Marland Kitchen losartan (COZAAR) 100 MG tablet Take 1 tablet (100 mg total) by mouth daily.  . metFORMIN (GLUCOPHAGE) 1000 MG tablet Take 500 mg by mouth daily with breakfast.  . metoprolol succinate (TOPROL XL) 25 MG 24 hr tablet Take 1 tablet (25 mg total) by mouth at bedtime.  . Multiple Vitamin (MULTIVITAMIN WITH MINERALS) TABS tablet Take 1 tablet by  mouth daily. Centrum Silver  . Multiple Vitamins-Minerals (PRESERVISION AREDS 2) CAPS Take 1 capsule by mouth 2 (two) times daily.   . ondansetron (ZOFRAN ODT) 4 MG disintegrating tablet Take 1 tablet (4 mg total) by mouth every 8 (eight) hours as needed for nausea or vomiting.  Marland Kitchen oxyCODONE-acetaminophen (PERCOCET) 5-325 MG tablet Take 1 tablet by mouth every 4 (four) hours as needed for severe pain.  . tamsulosin (FLOMAX) 0.4 MG CAPS capsule Take 1 capsule (0.4 mg total) by mouth daily.  . [DISCONTINUED] metoprolol tartrate (LOPRESSOR) 25 MG tablet Take 1 tablet (25 mg total) by mouth 2 (two) times daily.     Allergies:   Lisinopril, Other, Sulfa drugs cross reactors, Contrast media [iodinated diagnostic agents], Doxycycline, Ioxaglate, and Tetracyclines & related   Social History   Socioeconomic History  . Marital status: Married    Spouse name: Not on file  . Number of children: Not on file  . Years of education: Not on file  . Highest education level: Not on file  Occupational History   . Not on file  Tobacco Use  . Smoking status: Former Smoker    Years: 6.00    Types: Cigarettes    Quit date: 05/15/1973    Years since quitting: 47.2  . Smokeless tobacco: Never Used  Vaping Use  . Vaping Use: Never used  Substance and Sexual Activity  . Alcohol use: Yes    Comment: "2 cocktails twice a week"  . Drug use: Never  . Sexual activity: Not on file  Other Topics Concern  . Not on file  Social History Narrative  . Not on file   Social Determinants of Health   Financial Resource Strain: Low Risk   . Difficulty of Paying Living Expenses: Not very hard  Food Insecurity: No Food Insecurity  . Worried About Charity fundraiser in the Last Year: Never true  . Ran Out of Food in the Last Year: Never true  Transportation Needs: No Transportation Needs  . Lack of Transportation (Medical): No  . Lack of Transportation (Non-Medical): No  Physical Activity: Not on file  Stress: No Stress Concern Present  . Feeling of Stress : Not at all  Social Connections: Moderately Integrated  . Frequency of Communication with Friends and Family: Twice a week  . Frequency of Social Gatherings with Friends and Family: Once a week  . Attends Religious Services: Never  . Active Member of Clubs or Organizations: Yes  . Attends Archivist Meetings: More than 4 times per year  . Marital Status: Married     Family History: The patient's family history includes Cancer in his mother; Colon cancer in his mother; Dementia in his mother; Heart attack in his father and sister; Stroke in his sister. There is no history of Esophageal cancer, Rectal cancer, or Stomach cancer.  ROS:   Please see the history of present illness.    Review of Systems  Constitutional: Negative for chills, fever and malaise/fatigue.  HENT: Negative for hearing loss.   Eyes: Negative for blurred vision and redness.  Respiratory: Negative for cough and shortness of breath.   Cardiovascular: Negative for chest  pain, palpitations, orthopnea, claudication, leg swelling and PND.  Gastrointestinal: Negative for melena, nausea and vomiting.  Genitourinary: Negative for dysuria and flank pain.  Musculoskeletal: Positive for joint pain.  Neurological: Negative for dizziness and loss of consciousness.  Endo/Heme/Allergies: Negative for polydipsia.  Psychiatric/Behavioral: Negative for substance abuse.    EKGs/Labs/Other Studies  Reviewed:    The following studies were reviewed today: TTE 2019-08-03: IMPRESSIONS  1. Left ventricular ejection fraction, by estimation, is 55 to 60%. The  left ventricle has normal function. There is mild concentric left  ventricular hypertrophy. Left ventricular diastolic function could not be  evaluated. There is the interventricular  septum is flattened in systole and diastole, consistent with right  ventricular pressure and volume overload.  2. Left atrial size was severely dilated.  3. Right atrial size was severely dilated.  4. Mild to moderate mitral valve regurgitation.  5. Tricuspid valve regurgitation is moderate.  6. There is mildly elevated pulmonary artery systolic pressure. The  estimated right ventricular systolic pressure is 88.5 mmHg.   CABG 05/2019 PROCEDURE PERFORMED:1Coronary artery bypass grafting x3 with the left internal mammary to the left anterior descending coronary artery, reverse saphenous vein graft to the diagonal coronary artery, reverse saphenous vein graft to the distal right  coronary artery with right greater saphenous thigh endoscopic vein harvesting 2. Placement of Atriclip- Left Atrial Clip 45 mm  3. Sampling of left internal mammary lymph node 4. 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   LEFT HEART CATH AND CORONARY ANGIOGRAPHY10/30/2020  Conclusion   Moderately severe three-vessel coronary disease.  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 2  small obtuse marginal branch is supplied on the left lateral wall.  40% proximal LAD with 60 to 70% diffuse mid to distal LAD. First diagonal contains eccentric 50 to 70% narrowing.  Right coronary is diffusely diseased with mid 60 to 65% stenosis. Distal 75% stenosis before small PDA. 99% stenosis in the continuation of the right coronary before the second PLA. The acute marginal branch of the right coronary has multifocal 95% stenoses.  Low normal LVEF approximately 50% with normal EDP.  RECOMMENDATIONS:   Aggressive risk preventive therapy with LDL target less than 55.  Add long-acting nitrate therapy to improve exertional dyspnea and chest tightness.  If unacceptable symptoms, would consider multivessel coronary bypass grafting. In absence of high-grade proximal CAD, I feel a measured approach attempting to control symptoms with medications is a reasonable first option.    Cardiac Monitor 07/21/20: Patch Wear Time:  7 days and 1 hours (2022-02-22T07:32:26-0500 to 2022-03-01T08:42:52-0500)  1 run of Ventricular Tachycardia occurred lasting 8 beats with a max rate of 162 bpm (avg 139 bpm). Atrial Fibrillation occurred continuously (100% burden), ranging from 41-120 bpm (avg of 72 bpm). Isolated VEs were occasional (1.5%, 10751), VE Couplets  were rare (<1.0%, 267), and no VE Triplets were present. Ventricular Bigeminy and Trigeminy were present.     Recent Labs: 05/01/2020: ALT 39 07/01/2020: Hemoglobin 13.8; Magnesium 2.6; NT-Pro BNP 2,655; Platelets 157 07/13/2020: BUN 24; Creatinine, Ser 1.24; Potassium 4.8; Sodium 143  Recent Lipid Panel    Component Value Date/Time   CHOL 120 09/30/2019 1107   TRIG 91 09/30/2019 1107   HDL 41 09/30/2019 1107   CHOLHDL 2.9 09/30/2019 1107   LDLCALC 61 09/30/2019 1107     Risk Assessment/Calculations:    CHA2DS2-VASc Score = 5  This indicates a 7.2% annual risk of stroke. The patient's score is based upon: CHF History: No HTN  History: Yes Diabetes History: Yes Stroke History: No Vascular Disease History: Yes Age Score: 2 Gender Score: 0    Physical Exam:    VS:  BP 130/84   Pulse 91   Ht 6' (1.829 m)   Wt 218 lb 6.4  oz (99.1 kg)   SpO2 97%   BMI 29.62 kg/m     Wt Readings from Last 3 Encounters:  07/22/20 218 lb 6.4 oz (99.1 kg)  07/01/20 221 lb (100.2 kg)  06/08/20 219 lb (99.3 kg)     GEN:  Well nourished, well developed in no acute distress HEENT: Normal NECK: No JVD; No carotid bruits CARDIAC: Irregularly irregular, no murmurs, rubs, gallops RESPIRATORY:  Clear to auscultation without rales, wheezing or rhonchi  ABDOMEN: Soft, non-tender, non-distended MUSCULOSKELETAL:  No edema; No deformity  SKIN: Warm and dry NEUROLOGIC:  Alert and oriented x 3 PSYCHIATRIC:  Normal affect   ASSESSMENT:    1. Chronic diastolic heart failure (HCC)   2. Persistent atrial fibrillation ([place])   3. Essential hypertension   4. SOB (shortness of breath)   5. S/P CABG (coronary artery bypass graft)   6. DOE (dyspnea on exertion)   7. Hyperlipidemia, unspecified hyperlipidemia type   8. Chronic anticoagulation    PLAN:    In order of problems listed above:  #Chronic Diastolic HF Exacerbation During last visit on 02/17, the patient presented with worsening SOB and fatigue over the past several weeks found to have BNP 2655 consistent with acute on chronic diastolic HF exacerbation. Likely triggered by IVF received during recent abdominal procedure and HTN. We placed him on lasix 40mg  for 5 days followed by 20mg  daily thereafter. Symptoms now resolved with no SOB or LE edema. Appears euvolemic and compensated on examination. Zio with perssitent Afib, average HR 70s, occasional PVCs.  -Continue lasix 20mg  daily -Change metoprolol tartrate to succinate -Continue losartan 100mg  daily -Continue amlodipine 5mg  daily -Monitor daily weights and keep log -Low Na diet  #Multivessel CAD s/p CABG: S/p ACB in  05/2019. No anginal symptoms and doing well.  -Continue ASA 81mg  daily -Continue lipitor 40mg  daily -Change metoprolol to succinate 25mg  XL qHS -Continue losartan 100mg  daily  #Longstanding Persistent AF: CHADs-vasc 5. In Afib with HR 91 today. -Continue apixaban 5mg  BID -Metop as above  #HTN: Much better controlled at home. Mainly 120-130s. -Continue amlodipine 5mg  daily -Continue metop and losartan 100mg  -Low Na diet -Continue home BP monitoring  #HLD: -Continue atorvastatin as above  #Hematuria: Developed from passing kidney stone. Now resolved -Follow-up with Urology as scheduled  #Lung Nodule: Needs repeat CT 02/2021   Medication Adjustments/Labs and Tests Ordered: Current medicines are reviewed at length with the patient today.  Concerns regarding medicines are outlined above.  No orders of the defined types were placed in this encounter.  Meds ordered this encounter  Medications  . metoprolol succinate (TOPROL XL) 25 MG 24 hr tablet    Sig: Take 1 tablet (25 mg total) by mouth at bedtime.    Dispense:  90 tablet    Refill:  3    Patient Instructions  Medication Instructions:  Your physician has recommended you make the following change in your medication:   Stop Metoprolol Tartrate Start Metoprolol Succinate 25 mg  By mouth at night.    *If you need a refill on your cardiac medications before your next appointment, please call your pharmacy*   Testing/Procedures: None ordered today   Follow-Up: At [facility], you and your health needs are our priority.  As part of our continuing mission to provide you with exceptional heart care, we have created designated Provider Care Teams.  These Care Teams include your primary Cardiologist (physician) and Advanced Practice Providers (APPs -  Physician Assistants and Nurse Practitioners) who all  work together to provide you with the care you need, when you need it.  We recommend signing up for the patient  portal called "MyChart".  Sign up information is provided on this After Visit Summary.  MyChart is used to connect with patients for Virtual Visits (Telemedicine).  Patients are able to view lab/test results, encounter notes, upcoming appointments, etc.  Non-urgent messages can be sent to your provider as well.   To learn more about what you can do with MyChart, go to [email or web address].    Your next appointment:   3 month(s)  The format for your next appointment:   In Person  Provider:   You may see Dr. Gwyndolyn Kaufman or one of the following Advanced Practice Providers on your designated Care Team:    Richardson Dopp, PA-C  Robbie Lis, [place]          Signed, Freada Bergeron, MD  07/22/2020 12:05 PM    [place]

## 2020-07-22 ENCOUNTER — Other Ambulatory Visit: Payer: Self-pay

## 2020-07-22 ENCOUNTER — Ambulatory Visit: Payer: Medicare Other | Admitting: Cardiology

## 2020-07-22 ENCOUNTER — Encounter: Payer: Self-pay | Admitting: Cardiology

## 2020-07-22 VITALS — BP 130/84 | HR 91 | Ht 72.0 in | Wt 218.4 lb

## 2020-07-22 DIAGNOSIS — I5032 Chronic diastolic (congestive) heart failure: Secondary | ICD-10-CM | POA: Diagnosis not present

## 2020-07-22 DIAGNOSIS — I4819 Other persistent atrial fibrillation: Secondary | ICD-10-CM

## 2020-07-22 DIAGNOSIS — Z951 Presence of aortocoronary bypass graft: Secondary | ICD-10-CM

## 2020-07-22 DIAGNOSIS — I10 Essential (primary) hypertension: Secondary | ICD-10-CM

## 2020-07-22 DIAGNOSIS — E785 Hyperlipidemia, unspecified: Secondary | ICD-10-CM

## 2020-07-22 DIAGNOSIS — Z7901 Long term (current) use of anticoagulants: Secondary | ICD-10-CM

## 2020-07-22 DIAGNOSIS — R0602 Shortness of breath: Secondary | ICD-10-CM | POA: Diagnosis not present

## 2020-07-22 DIAGNOSIS — R0600 Dyspnea, unspecified: Secondary | ICD-10-CM

## 2020-07-22 DIAGNOSIS — R0609 Other forms of dyspnea: Secondary | ICD-10-CM

## 2020-07-22 MED ORDER — METOPROLOL SUCCINATE ER 25 MG PO TB24: 25.0000 mg | ORAL_TABLET | Freq: Every evening | ORAL | 3 refills | Status: DC

## 2020-07-22 NOTE — Patient Instructions (Addendum)
Medication Instructions:  Your physician has recommended you make the following change in your medication:   Stop Metoprolol Tartrate Start Metoprolol Succinate 25 mg  By mouth at night.    *If you need a refill on your cardiac medications before your next appointment, please call your pharmacy*   Testing/Procedures: None ordered today   Follow-Up: At [facility], you and your health needs are our priority.  As part of our continuing mission to provide you with exceptional heart care, we have created designated Provider Care Teams.  These Care Teams include your primary Cardiologist (physician) and Advanced Practice Providers (APPs -  Physician Assistants and Nurse Practitioners) who all work together to provide you with the care you need, when you need it.  We recommend signing up for the patient portal called "MyChart".  Sign up information is provided on this After Visit Summary.  MyChart is used to connect with patients for Virtual Visits (Telemedicine).  Patients are able to view lab/test results, encounter notes, upcoming appointments, etc.  Non-urgent messages can be sent to your provider as well.   To learn more about what you can do with MyChart, go to [email or web address].    Your next appointment:   3 month(s)  The format for your next appointment:   In Person  Provider:   You may see Dr. Gwyndolyn Kaufman or one of the following Advanced Practice Providers on your designated Care Team:    Richardson Dopp, PA-C  [place], [place]

## 2020-07-29 ENCOUNTER — Other Ambulatory Visit: Payer: Self-pay | Admitting: Thoracic Surgery (Cardiothoracic Vascular Surgery)

## 2020-07-29 DIAGNOSIS — D4989 Neoplasm of unspecified behavior of other specified sites: Secondary | ICD-10-CM

## 2020-08-04 ENCOUNTER — Other Ambulatory Visit: Payer: Self-pay | Admitting: Internal Medicine

## 2020-08-04 DIAGNOSIS — R6889 Other general symptoms and signs: Secondary | ICD-10-CM

## 2020-08-09 NOTE — Progress Notes (Unsigned)
Patient ID: [name]                 DOB: [birth year]                      MRN: [id number]     HPI: [name] is a 76 y.o. male referred by Sean. Johney Benitez to HTN/CHF clinic. PMH is significant for chronic diastolic heart failure (EF 55-60% on 07/17/19), CAD s/p CABG x3 with LA clipping on 05/27/19, chronic atrial fibrillation that has recurred after multiple DCCV on Eliquis, OSA on CPAP, HTN and HLD.   Pt seen by pharmacy on 07/13/20 and BP was elevated at 142/80. Pt reported taking losartan 50 mg BID instead of 100 mg daily as prescribed and was uncertain if taking metoprolol tartrate twice daily. Therefore, pt instructed to take 1 tablet of losartan 100 mg daily and started amlodipine 5 mg daily. Pt last seen by Sean. Johney Benitez on 07/22/20 and BP 130/84 was near goal. Reported BP well controlled at home (SBP 120-130s) and feels less dyspneic when walking. Additionally, Zio patch monitor captured 1 run of ventricular tachycardia with a max rate of 162 bpm (avg 139 bpm) and persistent atrial fibrillation ranging from 41-120 bpm (avg of 72 bpm). Pt was switched from metoprolol tartrate 25 mg BID to metoprolol succinate xl 25 mg daily.  Patient presents today in good spirits. Reports ***  Compliance? Took meds this morning? When do you take your meds? Dizziness, headaches, blurred vision? History of swelling? Check Clinic BP? Home BP logs? If no logs, bring to next visit w/ BP cuff - new BP cuff? Go over BP goals Additional BP therapy if needed . Increase amlodipine to 10 mg daily . Can increase Toprol if HR related symptoms Diet??  Exercise??   Current HTN/CHF meds: amlodipine 5 mg daily, losartan 100 mg daily, metoprolol succinate XL  25 mg daily, Lasix 20 mg daily (fluid) Previously tried: lisinopril (cough) BP goal: <130/80 mmHg  Family History: Cancer in his mother; Colon cancer in his mother; Dementia in his mother; Heart attack in his father and sister; Stroke in his sister  Social  History: former smoker (quit 1975)   Diet:  -Breakfast: cereal with fruit and skim milk; scrambled eggs with 15 grain wheat toast; sometimes bacon -Lunch: sandwich w/chicken or [place] and cheese; baked potato chips  -Dinner: white fish w/steam broccoli; chicken, homemade southwest soup; limits red meat; cut back salt intake  -Snacks: not really but fruits  -Drinks: water, soft drink w/zero sugar  Exercise:  Previously walking 40 minutes 2-3 times; does yard work   Home BP readings:   Wt Readings from Last 3 Encounters:  07/22/20 218 lb 6.4 oz (99.1 kg)  07/01/20 221 lb (100.2 kg)  06/08/20 219 lb (99.3 kg)   BP Readings from Last 3 Encounters:  07/22/20 130/84  07/13/20 (!) 142/80  07/01/20 (!) 144/88   Pulse Readings from Last 3 Encounters:  07/22/20 91  07/13/20 68  07/01/20 (!) 59    Renal function: CrCl cannot be calculated (Patient's most recent lab result is older than the maximum 21 days allowed.).  Past Medical History:  Diagnosis Date  . Anticoagulant long-term use    eliquis/ asa --- mangaed by cardiology  . Coronary artery disease cardiologist--- Sean Merle PA   s/p cardiac cath 03-14-2019 severe 3V ;  05-27-2019  s/p  CABG x3/ clipping atrial appendage  . Depression   . DOE (dyspnea on  exertion)    05-04-2020  per pt is is much better since CABG 01/ 2021,  recovers quickly after going up flight stairs  . Feeling of incomplete bladder emptying   . Full dentures   . Heart murmur   . History of prostate cancer 1998   urologist--- Sean [name]---  s/p radioactive seed implants 1998  . History of urethral stricture    s/p  dilatation  . Hypertension   . Incisional hernia    per pt was told by [name], had incisional hernia at CABG incision  . Macular degeneration of both eyes   . Nephrolithiasis    right side nonobstructive per CT 05-01-2020,  also had 2 right ureter stones and on 05-04-2020 pt stated he passed 2 stones 05-03-2020  . OSA on CPAP     uses CPAP-settings 5  . Osteoarthritis (arthritis due to wear and tear of joints)   . Persistent atrial fibrillation [facility])    cardiologist-- Sean Macho PA  . Slow urinary stream   . Type 2 diabetes mellitus (Progress)    followed by pcp  (05-04-2020  per pt fasting blood sugar average 98-120)  . 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 Wears hearing aid in both ears     Current Outpatient Medications on File Prior to Visit  Medication Sig Dispense Refill  . allopurinol (ZYLOPRIM) 300 MG tablet Take 300 mg by mouth daily.    Marland Kitchen amLODipine (NORVASC) 5 MG tablet Take 1 tablet (5 mg total) by mouth daily. 90 tablet 3  . aspirin EC 81 MG EC tablet Take 1 tablet (81 mg total) by mouth daily. 30 tablet 0  . atorvastatin (LIPITOR) 40 MG tablet Take 1 tablet (40 mg total) by mouth daily at 6 PM. 90 tablet 2  . ELIQUIS 5 MG TABS tablet TAKE 1 TABLET BY MOUTH  TWICE DAILY 180 tablet 3  . furosemide (LASIX) 20 MG tablet TAKE 1 TABLET BY MOUTH  DAILY 90 tablet 3  . HYDROcodone-acetaminophen (NORCO) 10-325 MG tablet Take 1 tablet by mouth 3 (three) times daily as needed.    Marland Kitchen losartan (COZAAR) 100 MG tablet Take 1 tablet (100 mg total) by mouth daily. 90 tablet 3  . metFORMIN (GLUCOPHAGE) 1000 MG tablet Take 500 mg by mouth daily with breakfast.    . metoprolol succinate (TOPROL XL) 25 MG 24 hr tablet Take 1 tablet (25 mg total) by mouth at bedtime. 90 tablet 3  . Multiple Vitamin (MULTIVITAMIN WITH MINERALS) TABS tablet Take 1 tablet by mouth daily. Centrum Silver    . Multiple Vitamins-Minerals (PRESERVISION AREDS 2) CAPS Take 1 capsule by mouth 2 (two) times daily.     . ondansetron (ZOFRAN ODT) 4 MG disintegrating tablet Take 1 tablet (4 mg total) by mouth every 8 (eight) hours as needed for nausea or vomiting. 20 tablet 0  . oxyCODONE-acetaminophen (PERCOCET) 5-325 MG tablet Take 1  tablet by mouth every 4 (four) hours as needed for severe pain. 20 tablet 0  . tamsulosin (FLOMAX) 0.4 MG CAPS capsule Take 1 capsule (0.4 mg total) by mouth daily. 10 capsule 0   No current facility-administered medications on file prior to visit.    Allergies  Allergen Reactions  . Lisinopril Cough  . Other Other (See Comments)  . Sulfa Drugs Cross Reactors     CHILDHOOD UNSPECIFIED REACTION   .  Contrast Media [Iodinated Diagnostic Agents] Rash  . Doxycycline Itching  . Ioxaglate Rash  . Tetracyclines & Related Rash     Assessment/Plan:  1. CHF -   [name], PharmD, [facility] [address], [place], [place] [address] Phone: [phone]; Fax: [phone]

## 2020-08-10 ENCOUNTER — Ambulatory Visit: Payer: Medicare Other

## 2020-08-13 ENCOUNTER — Ambulatory Visit
Admission: RE | Admit: 2020-08-13 | Discharge: 2020-08-13 | Disposition: A | Discharge status: Home / Self Care | Payer: Medicare Other | Source: Ambulatory Visit | Attending: Internal Medicine | Admitting: Internal Medicine

## 2020-08-13 ENCOUNTER — Other Ambulatory Visit: Payer: Self-pay | Admitting: *Deleted

## 2020-08-13 DIAGNOSIS — R6889 Other general symptoms and signs: Secondary | ICD-10-CM

## 2020-08-13 DIAGNOSIS — I4819 Other persistent atrial fibrillation: Secondary | ICD-10-CM

## 2020-08-13 MED ORDER — METOPROLOL SUCCINATE ER 25 MG PO TB24: 25.0000 mg | ORAL_TABLET | Freq: Every evening | ORAL | 3 refills | Status: DC

## 2020-08-19 ENCOUNTER — Other Ambulatory Visit (HOSPITAL_COMMUNITY): Payer: Self-pay | Admitting: Internal Medicine

## 2020-08-19 ENCOUNTER — Ambulatory Visit (HOSPITAL_COMMUNITY)
Admission: RE | Admit: 2020-08-19 | Discharge: 2020-08-19 | Disposition: A | Discharge status: Home / Self Care | Payer: Medicare Other | Source: Ambulatory Visit | Attending: Internal Medicine | Admitting: Internal Medicine

## 2020-08-19 ENCOUNTER — Other Ambulatory Visit: Payer: Self-pay | Admitting: Internal Medicine

## 2020-08-19 ENCOUNTER — Other Ambulatory Visit: Payer: Self-pay

## 2020-08-19 DIAGNOSIS — G9389 Other specified disorders of brain: Secondary | ICD-10-CM | POA: Insufficient documentation

## 2020-08-19 MED ORDER — GADOBUTROL 1 MMOL/ML IV SOLN
10.0000 mL | Freq: Once | INTRAVENOUS | Status: AC | PRN
  Administered 2020-08-19: 10 mL via INTRAVENOUS

## 2020-08-23 ENCOUNTER — Ambulatory Visit: Payer: Medicare Other | Admitting: Cardiology

## 2020-08-23 ENCOUNTER — Telehealth: Payer: Self-pay | Admitting: Cardiology

## 2020-08-23 MED ORDER — ELIQUIS 5 MG PO TABS: 1.0000 | ORAL_TABLET | Freq: Two times a day (BID) | ORAL | 1 refills | Status: DC

## 2020-08-23 NOTE — Telephone Encounter (Signed)
Pre-op request for biopsy and surgery (date TBD) wondering about holding Eliquis and Aspirin.   H/o CAD s/p CABx3 and LA clipping 05/27/19, chronic Afib with CHADSVASC of 5, OSA, HTN, chronic diasotlic CHF  Recently seen 07/22/20 for follow-up heart failure/volume overload and was euvolemic.   Dr. Johney Frame, please route response to P CV [facility] PREOP. Thanks!

## 2020-08-23 NOTE — Addendum Note (Signed)
Addended by: Lilybelle Mayeda E on: 08/23/2020 04:29 PM   Modules accepted: Orders

## 2020-08-23 NOTE — Telephone Encounter (Signed)
Please comment on Eliquis

## 2020-08-23 NOTE — Telephone Encounter (Signed)
Patient with diagnosis of afib on Eliquis for anticoagulation.    There are 2 clearance requests that were entered today. Other clearance specifies brain biopsy as the procedure being performed.  Date of procedure: TBD  CHA2DS2-VASc Score = 6  This indicates a 9.7% annual risk of stroke. The patient's score is based upon: CHF History: Yes HTN History: Yes Diabetes History: Yes Stroke History: No Vascular Disease History: Yes Age Score: 2 Gender Score: 0  CrCl 40mL/min using adjusted body weight Platelet count 157K  Per office protocol, patient can hold Eliquis for at least 3 days prior to procedure, longer if needed by MD performing procedure.

## 2020-08-23 NOTE — Telephone Encounter (Signed)
    [facility] Pre-operative Risk Assessment    Request for surgical clearance:  1. What type of surgery is being performed? biopsy along with surgery  2. When is this surgery scheduled? TBD  3. What type of clearance is required (medical clearance vs. Pharmacy clearance to hold med vs. Both)? both  4. Are there any medications that need to be held prior to surgery and how long? To be determind by the dr  5. Practice name and name of physician performing surgery? [facility]    6. What is your office phone number [phone]   7.   What is your office fax number [phone]  8.   Anesthesia type (None, local, MAC, general) ? general   Milbert Coulter 08/23/2020, 2:13 PM  _________________________________________________________________   (provider comments below)  Need to know asap please call [facility] or [facility]

## 2020-08-23 NOTE — Telephone Encounter (Signed)
   [name] Pre-operative Risk Assessment    Request for surgical clearance:  1. What type of surgery is being performed? Brain Biopsy  2. When is this surgery scheduled? TBD  3. What type of clearance is required (medical clearance vs. Pharmacy clearance to hold med vs. Both)? Medical and Med hold  4. Are there any medications that need to be held prior to surgery and how long? Blood Thinners possibly  5. Practice name and name of physician performing surgery? [facility]  6. What is your office phone number [phone]   7.   What is your office fax number [phone]  8.   Anesthesia type (None, local, MAC, general) ? Does not know   Larina Bras 08/23/2020, 3:20 PM  _________________________________________________________________   (provider comments below)

## 2020-08-25 ENCOUNTER — Other Ambulatory Visit: Payer: Self-pay | Admitting: Neurosurgery

## 2020-08-25 ENCOUNTER — Other Ambulatory Visit (HOSPITAL_COMMUNITY): Payer: Self-pay | Admitting: Neurosurgery

## 2020-08-25 DIAGNOSIS — D496 Neoplasm of unspecified behavior of brain: Secondary | ICD-10-CM

## 2020-08-25 NOTE — Telephone Encounter (Signed)
ADDENDUM: PROCEDURE IS RIGHT TEMPORAL CRANIOTOMY ANESTHESIA: GENERAL  HOLD ELIQUIS INSTRUCTIONS SURGERY DATE IS 09/01/20

## 2020-08-25 NOTE — Telephone Encounter (Signed)
   Name: [name]  DOB: September 07, 1945  MRN: [id number]   Primary Cardiologist: No primary care provider on file.  Chart reviewed as part of pre-operative protocol coverage. Patient was contacted 08/25/2020 in reference to pre-operative risk assessment for pending surgery as outlined below.  [name] was last seen on 07/22/20 by Dr. Johney Frame.  Since that day, [name] has done well.  He has a history of CABG in Jan 2021. He can complete more than 4.0 METS without angina.  As per below, I have advised him that his last dose of eliquis should be Saturday night for a surgery on Wed. If longer hold is needed per the surgeon, please reach out to the patient with revised instructions.  Per our clinical pharmacist: Patient with diagnosis of afib on Eliquis for anticoagulation.    There are 2 clearance requests that were entered today. Other clearance specifies brain biopsy as the procedure being performed.  Date of procedure: TBD  CHA2DS2-VASc Score = 6  This indicates a 9.7% annual risk of stroke. The patient's score is based upon: CHF History: Yes HTN History: Yes Diabetes History: Yes Stroke History: No Vascular Disease History: Yes Age Score: 2 Gender Score: 0  CrCl 45mL/min using adjusted body weight Platelet count 157K  Per office protocol, patient can hold Eliquis for at least 3 days prior to procedure, longer if needed by MD performing procedure.    Therefore, based on ACC/AHA guidelines, the patient would be at acceptable risk for the planned procedure without further cardiovascular testing.   The patient was advised that if he develops new symptoms prior to surgery to contact our office to arrange for a follow-up visit, and he verbalized understanding.  I will route this recommendation to the requesting party via Epic fax function and remove from pre-op pool. Please call with questions.  Tami Lin Elanor Cale, PA 08/25/2020, 2:28 PM

## 2020-08-26 ENCOUNTER — Other Ambulatory Visit: Payer: Self-pay

## 2020-08-26 ENCOUNTER — Ambulatory Visit (HOSPITAL_COMMUNITY)
Admission: RE | Admit: 2020-08-26 | Discharge: 2020-08-26 | Disposition: A | Discharge status: Home / Self Care | Payer: Medicare Other | Source: Ambulatory Visit | Attending: Neurosurgery | Admitting: Neurosurgery

## 2020-08-26 DIAGNOSIS — D496 Neoplasm of unspecified behavior of brain: Secondary | ICD-10-CM

## 2020-08-26 LAB — POCT I-STAT CREATININE: Creatinine, Ser: 1.3 mg/dL — ABNORMAL HIGH (ref 0.61–1.24)

## 2020-08-26 MED ORDER — IOHEXOL 300 MG/ML  SOLN
75.0000 mL | Freq: Once | INTRAMUSCULAR | Status: AC | PRN
  Administered 2020-08-26: 75 mL via INTRAVENOUS

## 2020-08-30 ENCOUNTER — Other Ambulatory Visit (HOSPITAL_COMMUNITY)
Admission: RE | Admit: 2020-08-30 | Discharge: 2020-08-30 | Disposition: A | Discharge status: Home / Self Care | Payer: Medicare Other | Source: Ambulatory Visit | Attending: Neurosurgery | Admitting: Neurosurgery

## 2020-08-30 DIAGNOSIS — Z01812 Encounter for preprocedural laboratory examination: Secondary | ICD-10-CM | POA: Insufficient documentation

## 2020-08-30 DIAGNOSIS — Z20822 Contact with and (suspected) exposure to covid-19: Secondary | ICD-10-CM | POA: Insufficient documentation

## 2020-08-30 LAB — SARS CORONAVIRUS 2 (TAT 6-24 HRS): SARS Coronavirus 2: NEGATIVE

## 2020-08-31 ENCOUNTER — Encounter (HOSPITAL_COMMUNITY): Payer: Self-pay | Admitting: Neurosurgery

## 2020-08-31 ENCOUNTER — Other Ambulatory Visit: Payer: Self-pay

## 2020-08-31 ENCOUNTER — Ambulatory Visit: Payer: Medicare Other | Admitting: Neurology

## 2020-08-31 NOTE — Progress Notes (Addendum)
Mr. [name] denies chest pain or shortness of breath, patient tested negative 08/30/20 for Covid and has been ion quarantine with his wife.    Mr. [name] was started on Prednisone by PCP, to help with swelling of brain prior to seeing Dr. Christella Noa. Patient is no longer taking Prednisone.  Mr. [name] has type II diabetes, patient reports that CBG run 104 - 153, 153 was while he was on Prednisone. I instructed patient to not take Metformin the am of surgery. I instructed patient to check CBG after awaking and every 2 hours until arrival  to the hospital.  I Instructed patient if CBG is less than 70 to drink 1/2 cup of a clear juice. Recheck CBG in 15 minutes if CBG is not over 70 call, pre- op desk at [phone] for further instructions.   Mr. [name] states Dr. Bridget Hartshorn was going to send an order to patient's pharmacy for anti seizure medication, patient said that [facility] has not called to tell him it is ready.  I instructed [facility] to call [facility] , if they do not have a prescription to Dr. Lacy Duverney office and inform them that [facility] doesn't have the prescription.  Mr. [name] called back and said the prescription is for Gabapentin 100mg  3 times a day, then instructions to increase doseages.  I gave Mr. [name] pharmacy call centtr and asked that he calls and give the prescription to pharmacy tech. I instructed patient to take Gabapentin in am. I called Dr. Lacy Duverney office and left a voice message on [facility] voice mail informing her that patient is just starting Gabapentin today.

## 2020-09-01 ENCOUNTER — Inpatient Hospital Stay (HOSPITAL_COMMUNITY)
Admission: RE | Admit: 2020-09-01 | Discharge: 2020-10-02 | DRG: 025 | Disposition: A | Discharge status: Hospice | Payer: Medicare Other | Attending: Neurosurgery | Admitting: Neurosurgery

## 2020-09-01 ENCOUNTER — Inpatient Hospital Stay (HOSPITAL_COMMUNITY): Payer: Medicare Other

## 2020-09-01 ENCOUNTER — Encounter (HOSPITAL_COMMUNITY)
Admission: RE | Disposition: A | Discharge status: Hospice | Payer: Self-pay | Source: Home / Self Care | Attending: Neurosurgery

## 2020-09-01 ENCOUNTER — Other Ambulatory Visit: Payer: Self-pay

## 2020-09-01 ENCOUNTER — Encounter (HOSPITAL_COMMUNITY): Payer: Self-pay | Admitting: Neurosurgery

## 2020-09-01 DIAGNOSIS — I10 Essential (primary) hypertension: Secondary | ICD-10-CM | POA: Diagnosis present

## 2020-09-01 DIAGNOSIS — B9561 Methicillin susceptible Staphylococcus aureus infection as the cause of diseases classified elsewhere: Secondary | ICD-10-CM | POA: Diagnosis not present

## 2020-09-01 DIAGNOSIS — D496 Neoplasm of unspecified behavior of brain: Secondary | ICD-10-CM | POA: Diagnosis not present

## 2020-09-01 DIAGNOSIS — R5383 Other fatigue: Secondary | ICD-10-CM

## 2020-09-01 DIAGNOSIS — Z66 Do not resuscitate: Secondary | ICD-10-CM | POA: Diagnosis not present

## 2020-09-01 DIAGNOSIS — Z9889 Other specified postprocedural states: Secondary | ICD-10-CM

## 2020-09-01 DIAGNOSIS — I615 Nontraumatic intracerebral hemorrhage, intraventricular: Secondary | ICD-10-CM | POA: Diagnosis not present

## 2020-09-01 DIAGNOSIS — Y838 Other surgical procedures as the cause of abnormal reaction of the patient, or of later complication, without mention of misadventure at the time of the procedure: Secondary | ICD-10-CM | POA: Diagnosis not present

## 2020-09-01 DIAGNOSIS — R410 Disorientation, unspecified: Secondary | ICD-10-CM | POA: Diagnosis not present

## 2020-09-01 DIAGNOSIS — I6201 Nontraumatic acute subdural hemorrhage: Secondary | ICD-10-CM | POA: Diagnosis not present

## 2020-09-01 DIAGNOSIS — Z515 Encounter for palliative care: Secondary | ICD-10-CM | POA: Diagnosis not present

## 2020-09-01 DIAGNOSIS — G8194 Hemiplegia, unspecified affecting left nondominant side: Secondary | ICD-10-CM | POA: Diagnosis not present

## 2020-09-01 DIAGNOSIS — R0902 Hypoxemia: Secondary | ICD-10-CM | POA: Diagnosis not present

## 2020-09-01 DIAGNOSIS — Z96652 Presence of left artificial knee joint: Secondary | ICD-10-CM | POA: Diagnosis present

## 2020-09-01 DIAGNOSIS — R29810 Facial weakness: Secondary | ICD-10-CM | POA: Diagnosis not present

## 2020-09-01 DIAGNOSIS — Z951 Presence of aortocoronary bypass graft: Secondary | ICD-10-CM

## 2020-09-01 DIAGNOSIS — E43 Unspecified severe protein-calorie malnutrition: Secondary | ICD-10-CM | POA: Diagnosis present

## 2020-09-01 DIAGNOSIS — Z87442 Personal history of urinary calculi: Secondary | ICD-10-CM

## 2020-09-01 DIAGNOSIS — Z8546 Personal history of malignant neoplasm of prostate: Secondary | ICD-10-CM

## 2020-09-01 DIAGNOSIS — C711 Malignant neoplasm of frontal lobe: Secondary | ICD-10-CM | POA: Diagnosis not present

## 2020-09-01 DIAGNOSIS — J9621 Acute and chronic respiratory failure with hypoxia: Secondary | ICD-10-CM | POA: Diagnosis not present

## 2020-09-01 DIAGNOSIS — F05 Delirium due to known physiological condition: Secondary | ICD-10-CM | POA: Diagnosis not present

## 2020-09-01 DIAGNOSIS — C712 Malignant neoplasm of temporal lobe: Secondary | ICD-10-CM | POA: Diagnosis present

## 2020-09-01 DIAGNOSIS — E870 Hyperosmolality and hypernatremia: Secondary | ICD-10-CM | POA: Diagnosis present

## 2020-09-01 DIAGNOSIS — N179 Acute kidney failure, unspecified: Secondary | ICD-10-CM | POA: Diagnosis not present

## 2020-09-01 DIAGNOSIS — C719 Malignant neoplasm of brain, unspecified: Secondary | ICD-10-CM | POA: Diagnosis not present

## 2020-09-01 DIAGNOSIS — Z87891 Personal history of nicotine dependence: Secondary | ICD-10-CM

## 2020-09-01 DIAGNOSIS — Z7901 Long term (current) use of anticoagulants: Secondary | ICD-10-CM

## 2020-09-01 DIAGNOSIS — G9751 Postprocedural hemorrhage and hematoma of a nervous system organ or structure following a nervous system procedure: Secondary | ICD-10-CM | POA: Diagnosis not present

## 2020-09-01 DIAGNOSIS — H9190 Unspecified hearing loss, unspecified ear: Secondary | ICD-10-CM | POA: Diagnosis present

## 2020-09-01 DIAGNOSIS — Z923 Personal history of irradiation: Secondary | ICD-10-CM

## 2020-09-01 DIAGNOSIS — Z85238 Personal history of other malignant neoplasm of thymus: Secondary | ICD-10-CM

## 2020-09-01 DIAGNOSIS — E1165 Type 2 diabetes mellitus with hyperglycemia: Secondary | ICD-10-CM | POA: Diagnosis not present

## 2020-09-01 DIAGNOSIS — Z20822 Contact with and (suspected) exposure to covid-19: Secondary | ICD-10-CM | POA: Diagnosis present

## 2020-09-01 DIAGNOSIS — J189 Pneumonia, unspecified organism: Secondary | ICD-10-CM

## 2020-09-01 DIAGNOSIS — J9601 Acute respiratory failure with hypoxia: Secondary | ICD-10-CM | POA: Diagnosis not present

## 2020-09-01 DIAGNOSIS — Z888 Allergy status to other drugs, medicaments and biological substances status: Secondary | ICD-10-CM

## 2020-09-01 DIAGNOSIS — R4701 Aphasia: Secondary | ICD-10-CM | POA: Diagnosis not present

## 2020-09-01 DIAGNOSIS — H3530 Unspecified macular degeneration: Secondary | ICD-10-CM | POA: Diagnosis present

## 2020-09-01 DIAGNOSIS — G9341 Metabolic encephalopathy: Secondary | ICD-10-CM | POA: Diagnosis not present

## 2020-09-01 DIAGNOSIS — Z974 Presence of external hearing-aid: Secondary | ICD-10-CM

## 2020-09-01 DIAGNOSIS — Z6826 Body mass index (BMI) 26.0-26.9, adult: Secondary | ICD-10-CM

## 2020-09-01 DIAGNOSIS — Z7189 Other specified counseling: Secondary | ICD-10-CM | POA: Diagnosis not present

## 2020-09-01 DIAGNOSIS — F32A Depression, unspecified: Secondary | ICD-10-CM | POA: Diagnosis present

## 2020-09-01 DIAGNOSIS — J690 Pneumonitis due to inhalation of food and vomit: Secondary | ICD-10-CM | POA: Diagnosis not present

## 2020-09-01 DIAGNOSIS — R739 Hyperglycemia, unspecified: Secondary | ICD-10-CM | POA: Diagnosis not present

## 2020-09-01 DIAGNOSIS — I4819 Other persistent atrial fibrillation: Secondary | ICD-10-CM | POA: Diagnosis present

## 2020-09-01 DIAGNOSIS — R471 Dysarthria and anarthria: Secondary | ICD-10-CM | POA: Diagnosis not present

## 2020-09-01 DIAGNOSIS — Z882 Allergy status to sulfonamides status: Secondary | ICD-10-CM

## 2020-09-01 DIAGNOSIS — G936 Cerebral edema: Secondary | ICD-10-CM | POA: Diagnosis present

## 2020-09-01 DIAGNOSIS — G4733 Obstructive sleep apnea (adult) (pediatric): Secondary | ICD-10-CM | POA: Diagnosis present

## 2020-09-01 DIAGNOSIS — I2510 Atherosclerotic heart disease of native coronary artery without angina pectoris: Secondary | ICD-10-CM | POA: Diagnosis present

## 2020-09-01 DIAGNOSIS — M1990 Unspecified osteoarthritis, unspecified site: Secondary | ICD-10-CM | POA: Diagnosis present

## 2020-09-01 DIAGNOSIS — Z01818 Encounter for other preprocedural examination: Secondary | ICD-10-CM

## 2020-09-01 DIAGNOSIS — J9600 Acute respiratory failure, unspecified whether with hypoxia or hypercapnia: Secondary | ICD-10-CM | POA: Diagnosis not present

## 2020-09-01 HISTORY — PX: APPLICATION OF CRANIAL NAVIGATION: SHX6578

## 2020-09-01 HISTORY — PX: CRANIOTOMY: SHX93

## 2020-09-01 HISTORY — DX: Malignant (primary) neoplasm, unspecified: C80.1

## 2020-09-01 LAB — COMPREHENSIVE METABOLIC PANEL
ALT: 29 U/L (ref 0–44)
AST: 26 U/L (ref 15–41)
Albumin: 3.7 g/dL (ref 3.5–5.0)
Alkaline Phosphatase: 70 U/L (ref 38–126)
Anion gap: 8 (ref 5–15)
BUN: 21 mg/dL (ref 8–23)
CO2: 24 mmol/L (ref 22–32)
Calcium: 9 mg/dL (ref 8.9–10.3)
Chloride: 106 mmol/L (ref 98–111)
Creatinine, Ser: 1.31 mg/dL — ABNORMAL HIGH (ref 0.61–1.24)
GFR, Estimated: 56 mL/min — ABNORMAL LOW (ref >60)
Glucose, Bld: 112 mg/dL — ABNORMAL HIGH (ref 70–99)
Potassium: 4.2 mmol/L (ref 3.5–5.1)
Sodium: 138 mmol/L (ref 135–145)
Total Bilirubin: 1.2 mg/dL (ref 0.3–1.2)
Total Protein: 6.3 g/dL — ABNORMAL LOW (ref 6.5–8.1)

## 2020-09-01 LAB — TYPE AND SCREEN
ABO/RH(D): AB POS
Antibody Screen: NEGATIVE

## 2020-09-01 LAB — CBC
HCT: 43.7 % (ref 39.0–52.0)
Hemoglobin: 14.4 g/dL (ref 13.0–17.0)
MCH: 30.4 pg (ref 26.0–34.0)
MCHC: 33 g/dL (ref 30.0–36.0)
MCV: 92.4 fL (ref 80.0–100.0)
Platelets: 98 10*3/uL — ABNORMAL LOW (ref 150–400)
RBC: 4.73 MIL/uL (ref 4.22–5.81)
RDW: 14.3 % (ref 11.5–15.5)
WBC: 6 10*3/uL (ref 4.0–10.5)
nRBC: 0 % (ref 0.0–0.2)

## 2020-09-01 LAB — GLUCOSE, CAPILLARY
Glucose-Capillary: 108 mg/dL — ABNORMAL HIGH (ref 70–99)
Glucose-Capillary: 116 mg/dL — ABNORMAL HIGH (ref 70–99)

## 2020-09-01 LAB — MRSA PCR SCREENING: MRSA by PCR: NEGATIVE

## 2020-09-01 SURGERY — CRANIOTOMY TUMOR EXCISION
Anesthesia: General | Site: Head | Laterality: Right

## 2020-09-01 MED ORDER — OXYCODONE HCL 5 MG/5ML PO SOLN: 5.0000 mg | Freq: Once | ORAL | Status: DC | PRN

## 2020-09-01 MED ORDER — DEXAMETHASONE SODIUM PHOSPHATE 10 MG/ML IJ SOLN
INTRAMUSCULAR | Status: DC | PRN
  Administered 2020-09-01: 10 mg via INTRAVENOUS

## 2020-09-01 MED ORDER — MICROFIBRILLAR COLL HEMOSTAT EX PADS
MEDICATED_PAD | CUTANEOUS | Status: DC | PRN
  Administered 2020-09-01: 1 via TOPICAL

## 2020-09-01 MED ORDER — LIDOCAINE-EPINEPHRINE 0.5 %-1:200000 IJ SOLN
INTRAMUSCULAR | Status: AC
  Filled 2020-09-01: qty 1

## 2020-09-01 MED ORDER — CHLORHEXIDINE GLUCONATE 0.12 % MT SOLN
OROMUCOSAL | Status: AC
  Administered 2020-09-01: 15 mL via OROMUCOSAL
  Filled 2020-09-01: qty 15

## 2020-09-01 MED ORDER — CHLORHEXIDINE GLUCONATE CLOTH 2 % EX PADS: 6.0000 | MEDICATED_PAD | Freq: Once | CUTANEOUS | Status: DC

## 2020-09-01 MED ORDER — MIDAZOLAM HCL 2 MG/2ML IJ SOLN: 0.5000 mg | Freq: Once | INTRAMUSCULAR | Status: DC | PRN

## 2020-09-01 MED ORDER — FENTANYL CITRATE (PF) 250 MCG/5ML IJ SOLN
INTRAMUSCULAR | Status: AC
  Filled 2020-09-01: qty 5

## 2020-09-01 MED ORDER — NALOXONE HCL 0.4 MG/ML IJ SOLN: 0.0800 mg | INTRAMUSCULAR | Status: DC | PRN

## 2020-09-01 MED ORDER — ORAL CARE MOUTH RINSE: 15.0000 mL | Freq: Once | OROMUCOSAL | Status: AC

## 2020-09-01 MED ORDER — ACETAMINOPHEN 650 MG RE SUPP: 650.0000 mg | RECTAL | Status: DC | PRN

## 2020-09-01 MED ORDER — FENTANYL CITRATE (PF) 250 MCG/5ML IJ SOLN
INTRAMUSCULAR | Status: DC | PRN
  Administered 2020-09-01: 250 ug via INTRAVENOUS
  Administered 2020-09-01: 25 ug via INTRAVENOUS
  Administered 2020-09-01: 50 ug via INTRAVENOUS
  Administered 2020-09-01: 25 ug via INTRAVENOUS
  Administered 2020-09-01 (×3): 50 ug via INTRAVENOUS

## 2020-09-01 MED ORDER — DEXAMETHASONE SODIUM PHOSPHATE 10 MG/ML IJ SOLN
INTRAMUSCULAR | Status: AC
  Filled 2020-09-01: qty 1

## 2020-09-01 MED ORDER — SODIUM CHLORIDE 0.9 % IV SOLN
0.0125 ug/kg/min | INTRAVENOUS | Status: AC
  Administered 2020-09-01: .1 ug/kg/min via INTRAVENOUS
  Filled 2020-09-01: qty 2000

## 2020-09-01 MED ORDER — PHENYLEPHRINE 40 MCG/ML (10ML) SYRINGE FOR IV PUSH (FOR BLOOD PRESSURE SUPPORT)
PREFILLED_SYRINGE | INTRAVENOUS | Status: AC
  Filled 2020-09-01: qty 10

## 2020-09-01 MED ORDER — MEPERIDINE HCL 25 MG/ML IJ SOLN: 6.2500 mg | INTRAMUSCULAR | Status: DC | PRN

## 2020-09-01 MED ORDER — LACTATED RINGERS IV SOLN: INTRAVENOUS | Status: DC | PRN

## 2020-09-01 MED ORDER — MANNITOL 25 % IV SOLN
INTRAVENOUS | Status: DC | PRN
  Administered 2020-09-01: 37.5 g via INTRAVENOUS

## 2020-09-01 MED ORDER — ROCURONIUM BROMIDE 10 MG/ML (PF) SYRINGE
PREFILLED_SYRINGE | INTRAVENOUS | Status: AC
  Filled 2020-09-01: qty 10

## 2020-09-01 MED ORDER — ROCURONIUM BROMIDE 10 MG/ML (PF) SYRINGE
PREFILLED_SYRINGE | INTRAVENOUS | Status: DC | PRN
  Administered 2020-09-01: 20 mg via INTRAVENOUS
  Administered 2020-09-01: 40 mg via INTRAVENOUS
  Administered 2020-09-01: 60 mg via INTRAVENOUS

## 2020-09-01 MED ORDER — PANTOPRAZOLE SODIUM 40 MG IV SOLR
40.0000 mg | Freq: Every day | INTRAVENOUS | Status: DC
  Administered 2020-09-01 – 2020-09-07 (×7): 40 mg via INTRAVENOUS
  Filled 2020-09-01 (×7): qty 40

## 2020-09-01 MED ORDER — PROMETHAZINE HCL 25 MG/ML IJ SOLN: 6.2500 mg | INTRAMUSCULAR | Status: DC | PRN

## 2020-09-01 MED ORDER — GABAPENTIN 100 MG PO CAPS
100.0000 mg | ORAL_CAPSULE | Freq: Three times a day (TID) | ORAL | Status: DC
  Administered 2020-09-01: 100 mg via ORAL
  Filled 2020-09-01: qty 1

## 2020-09-01 MED ORDER — ASPIRIN EC 81 MG PO TBEC: 81.0000 mg | DELAYED_RELEASE_TABLET | Freq: Every day | ORAL | Status: DC

## 2020-09-01 MED ORDER — BACITRACIN ZINC 500 UNIT/GM EX OINT
TOPICAL_OINTMENT | CUTANEOUS | Status: DC | PRN
  Administered 2020-09-01: 1 via TOPICAL

## 2020-09-01 MED ORDER — PHENYLEPHRINE 40 MCG/ML (10ML) SYRINGE FOR IV PUSH (FOR BLOOD PRESSURE SUPPORT)
PREFILLED_SYRINGE | INTRAVENOUS | Status: AC
  Filled 2020-09-01: qty 20

## 2020-09-01 MED ORDER — CEFAZOLIN SODIUM-DEXTROSE 2-4 GM/100ML-% IV SOLN: 2.0000 g | INTRAVENOUS | Status: DC

## 2020-09-01 MED ORDER — SODIUM CHLORIDE 0.9 % IV SOLN: INTRAVENOUS | Status: DC | PRN

## 2020-09-01 MED ORDER — DEXAMETHASONE 4 MG PO TABS
6.0000 mg | ORAL_TABLET | Freq: Four times a day (QID) | ORAL | Status: DC
  Administered 2020-09-01: 6 mg via ORAL
  Filled 2020-09-01: qty 2

## 2020-09-01 MED ORDER — METFORMIN HCL 500 MG PO TABS: 500.0000 mg | ORAL_TABLET | Freq: Every day | ORAL | Status: DC

## 2020-09-01 MED ORDER — ACETAMINOPHEN 325 MG PO TABS
650.0000 mg | ORAL_TABLET | ORAL | Status: DC | PRN
  Filled 2020-09-01: qty 2

## 2020-09-01 MED ORDER — LABETALOL HCL 5 MG/ML IV SOLN
10.0000 mg | INTRAVENOUS | Status: DC | PRN
  Administered 2020-09-04: 20 mg via INTRAVENOUS
  Administered 2020-09-05 (×2): 40 mg via INTRAVENOUS
  Administered 2020-09-06: 20 mg via INTRAVENOUS
  Administered 2020-09-06: 10 mg via INTRAVENOUS
  Administered 2020-09-06 – 2020-09-07 (×2): 20 mg via INTRAVENOUS
  Filled 2020-09-01 (×3): qty 4
  Filled 2020-09-01 (×2): qty 8
  Filled 2020-09-01 (×2): qty 4

## 2020-09-01 MED ORDER — ONDANSETRON HCL 4 MG/2ML IJ SOLN
INTRAMUSCULAR | Status: DC | PRN
  Administered 2020-09-01: 4 mg via INTRAVENOUS

## 2020-09-01 MED ORDER — LIDOCAINE 2% (20 MG/ML) 5 ML SYRINGE
INTRAMUSCULAR | Status: AC
  Filled 2020-09-01: qty 5

## 2020-09-01 MED ORDER — PROPOFOL 10 MG/ML IV BOLUS
INTRAVENOUS | Status: AC
  Filled 2020-09-01: qty 20

## 2020-09-01 MED ORDER — DOCUSATE SODIUM 100 MG PO CAPS: 100.0000 mg | ORAL_CAPSULE | Freq: Two times a day (BID) | ORAL | Status: DC

## 2020-09-01 MED ORDER — THROMBIN 20000 UNITS EX SOLR
CUTANEOUS | Status: AC
  Filled 2020-09-01: qty 20000

## 2020-09-01 MED ORDER — HEPARIN SODIUM (PORCINE) 5000 UNIT/ML IJ SOLN: 5000.0000 [IU] | Freq: Three times a day (TID) | INTRAMUSCULAR | Status: DC

## 2020-09-01 MED ORDER — PROMETHAZINE HCL 25 MG PO TABS: 12.5000 mg | ORAL_TABLET | ORAL | Status: DC | PRN

## 2020-09-01 MED ORDER — ESMOLOL HCL 100 MG/10ML IV SOLN
INTRAVENOUS | Status: AC
  Filled 2020-09-01: qty 10

## 2020-09-01 MED ORDER — CHLORHEXIDINE GLUCONATE 0.12 % MT SOLN: 15.0000 mL | Freq: Once | OROMUCOSAL | Status: AC

## 2020-09-01 MED ORDER — OXYCODONE HCL 5 MG PO TABS
5.0000 mg | ORAL_TABLET | Freq: Once | ORAL | Status: DC | PRN
Start: 2020-09-01 — End: 2020-09-01

## 2020-09-01 MED ORDER — POTASSIUM CHLORIDE IN NACL 20-0.9 MEQ/L-% IV SOLN
INTRAVENOUS | Status: DC
  Filled 2020-09-01 (×9): qty 1000

## 2020-09-01 MED ORDER — LEVETIRACETAM IN NACL 1000 MG/100ML IV SOLN
1000.0000 mg | Freq: Once | INTRAVENOUS | Status: AC
  Administered 2020-09-01: 1000 mg via INTRAVENOUS
  Filled 2020-09-01: qty 100

## 2020-09-01 MED ORDER — EPHEDRINE 5 MG/ML INJ
INTRAVENOUS | Status: AC
  Filled 2020-09-01: qty 10

## 2020-09-01 MED ORDER — FUROSEMIDE 20 MG PO TABS: 20.0000 mg | ORAL_TABLET | Freq: Every day | ORAL | Status: DC

## 2020-09-01 MED ORDER — ADULT MULTIVITAMIN W/MINERALS CH: 1.0000 | ORAL_TABLET | Freq: Every day | ORAL | Status: DC

## 2020-09-01 MED ORDER — LOSARTAN POTASSIUM 50 MG PO TABS: 100.0000 mg | ORAL_TABLET | Freq: Every day | ORAL | Status: DC

## 2020-09-01 MED ORDER — PROSIGHT PO TABS
1.0000 | ORAL_TABLET | Freq: Two times a day (BID) | ORAL | Status: DC
  Filled 2020-09-01 (×4): qty 1

## 2020-09-01 MED ORDER — LIDOCAINE-EPINEPHRINE 0.5 %-1:200000 IJ SOLN
INTRAMUSCULAR | Status: DC | PRN
  Administered 2020-09-01: 10 mL

## 2020-09-01 MED ORDER — ONDANSETRON HCL 4 MG/2ML IJ SOLN
4.0000 mg | INTRAMUSCULAR | Status: DC | PRN
  Filled 2020-09-01: qty 2

## 2020-09-01 MED ORDER — PROPOFOL 10 MG/ML IV BOLUS
INTRAVENOUS | Status: DC | PRN
  Administered 2020-09-01: 50 mg via INTRAVENOUS
  Administered 2020-09-01: 130 mg via INTRAVENOUS

## 2020-09-01 MED ORDER — SUGAMMADEX SODIUM 200 MG/2ML IV SOLN
INTRAVENOUS | Status: DC | PRN
  Administered 2020-09-01: 300 mg via INTRAVENOUS

## 2020-09-01 MED ORDER — HYDROMORPHONE HCL 1 MG/ML IJ SOLN: 0.2500 mg | INTRAMUSCULAR | Status: DC | PRN

## 2020-09-01 MED ORDER — SODIUM CHLORIDE 0.9 % IV SOLN: INTRAVENOUS | Status: DC

## 2020-09-01 MED ORDER — CEFAZOLIN SODIUM 1 G IJ SOLR
INTRAMUSCULAR | Status: AC
  Filled 2020-09-01: qty 20

## 2020-09-01 MED ORDER — METOPROLOL SUCCINATE ER 25 MG PO TB24
25.0000 mg | ORAL_TABLET | Freq: Every day | ORAL | Status: DC
  Administered 2020-09-01: 25 mg via ORAL
  Filled 2020-09-01: qty 1

## 2020-09-01 MED ORDER — MORPHINE SULFATE (PF) 2 MG/ML IV SOLN
1.0000 mg | INTRAVENOUS | Status: DC | PRN
  Administered 2020-09-01: 2 mg via INTRAVENOUS
  Filled 2020-09-01: qty 1

## 2020-09-01 MED ORDER — AMLODIPINE BESYLATE 5 MG PO TABS: 5.0000 mg | ORAL_TABLET | Freq: Every day | ORAL | Status: DC

## 2020-09-01 MED ORDER — PHENYLEPHRINE HCL-NACL 10-0.9 MG/250ML-% IV SOLN
INTRAVENOUS | Status: DC | PRN
  Administered 2020-09-01: 20 ug/min via INTRAVENOUS

## 2020-09-01 MED ORDER — LIDOCAINE HCL URETHRAL/MUCOSAL 2 % EX GEL
CUTANEOUS | Status: DC | PRN
  Administered 2020-09-01: 1 via TOPICAL

## 2020-09-01 MED ORDER — LIDOCAINE 2% (20 MG/ML) 5 ML SYRINGE
INTRAMUSCULAR | Status: DC | PRN
  Administered 2020-09-01: 20 mg via INTRAVENOUS

## 2020-09-01 MED ORDER — PHENYLEPHRINE 40 MCG/ML (10ML) SYRINGE FOR IV PUSH (FOR BLOOD PRESSURE SUPPORT)
PREFILLED_SYRINGE | INTRAVENOUS | Status: DC | PRN
  Administered 2020-09-01 (×3): 80 ug via INTRAVENOUS
  Administered 2020-09-01: 40 ug via INTRAVENOUS

## 2020-09-01 MED ORDER — DEXAMETHASONE 4 MG PO TABS: 4.0000 mg | ORAL_TABLET | Freq: Three times a day (TID) | ORAL | Status: DC

## 2020-09-01 MED ORDER — CEFAZOLIN SODIUM-DEXTROSE 2-3 GM-%(50ML) IV SOLR
INTRAVENOUS | Status: DC | PRN
  Administered 2020-09-01: 2 g via INTRAVENOUS

## 2020-09-01 MED ORDER — DEXAMETHASONE 4 MG PO TABS: 4.0000 mg | ORAL_TABLET | Freq: Four times a day (QID) | ORAL | Status: DC

## 2020-09-01 MED ORDER — SENNOSIDES-DOCUSATE SODIUM 8.6-50 MG PO TABS: 1.0000 | ORAL_TABLET | Freq: Every evening | ORAL | Status: DC | PRN

## 2020-09-01 MED ORDER — BACITRACIN ZINC 500 UNIT/GM EX OINT
TOPICAL_OINTMENT | CUTANEOUS | Status: AC
  Filled 2020-09-01: qty 28.35

## 2020-09-01 MED ORDER — ONDANSETRON HCL 4 MG/2ML IJ SOLN
INTRAMUSCULAR | Status: AC
  Filled 2020-09-01: qty 2

## 2020-09-01 MED ORDER — ONDANSETRON HCL 4 MG PO TABS: 4.0000 mg | ORAL_TABLET | ORAL | Status: DC | PRN

## 2020-09-01 MED ORDER — THROMBIN 20000 UNITS EX SOLR: CUTANEOUS | Status: DC | PRN

## 2020-09-01 MED ORDER — ESMOLOL HCL 100 MG/10ML IV SOLN
INTRAVENOUS | Status: DC | PRN
  Administered 2020-09-01: 30 mg via INTRAVENOUS

## 2020-09-01 MED ORDER — ONDANSETRON HCL 4 MG/2ML IJ SOLN
INTRAMUSCULAR | Status: AC
  Filled 2020-09-01: qty 4

## 2020-09-01 MED ORDER — EPHEDRINE SULFATE 50 MG/ML IJ SOLN
INTRAMUSCULAR | Status: DC | PRN
  Administered 2020-09-01: 10 mg via INTRAVENOUS

## 2020-09-01 MED ORDER — SODIUM CHLORIDE (PF) 0.9 % IJ SOLN
INTRAMUSCULAR | Status: AC
  Filled 2020-09-01: qty 10

## 2020-09-01 MED ORDER — ATORVASTATIN CALCIUM 40 MG PO TABS: 40.0000 mg | ORAL_TABLET | Freq: Every day | ORAL | Status: DC

## 2020-09-01 MED ORDER — LEVETIRACETAM IN NACL 500 MG/100ML IV SOLN
500.0000 mg | Freq: Two times a day (BID) | INTRAVENOUS | Status: DC
  Administered 2020-09-01 – 2020-09-08 (×14): 500 mg via INTRAVENOUS
  Filled 2020-09-01 (×14): qty 100

## 2020-09-01 MED ORDER — EPHEDRINE 5 MG/ML INJ
INTRAVENOUS | Status: AC
  Filled 2020-09-01: qty 20

## 2020-09-01 MED ORDER — HYDROCODONE-ACETAMINOPHEN 5-325 MG PO TABS
1.0000 | ORAL_TABLET | ORAL | Status: DC | PRN
  Administered 2020-09-01: 1 via ORAL
  Filled 2020-09-01: qty 1

## 2020-09-01 MED ORDER — 0.9 % SODIUM CHLORIDE (POUR BTL) OPTIME
TOPICAL | Status: DC | PRN
  Administered 2020-09-01 (×3): 1000 mL

## 2020-09-01 SURGICAL SUPPLY — 82 items
BAND RUBBER #18 3X1/16 STRL (MISCELLANEOUS) IMPLANT
BENZOIN TINCTURE PRP APPL 2/3 (GAUZE/BANDAGES/DRESSINGS) IMPLANT
BLADE CLIPPER SURG (BLADE) ×3 IMPLANT
BLADE SURG 15 STRL LF DISP TIS (BLADE) IMPLANT
BLADE SURG 15 STRL SS (BLADE)
BLADE ULTRA TIP 2M (BLADE) IMPLANT
BNDG GAUZE ELAST 4 BULKY (GAUZE/BANDAGES/DRESSINGS) ×3 IMPLANT
BUR ACORN 6.0 PRECISION (BURR) ×3 IMPLANT
BUR MATCHSTICK NEURO 3.0 LAGG (BURR) IMPLANT
BUR SPIRAL ROUTER 2.3 (BUR) IMPLANT
CANISTER SUCT 3000ML PPV (MISCELLANEOUS) ×3 IMPLANT
CARTRIDGE OIL MAESTRO DRILL (MISCELLANEOUS) ×2 IMPLANT
CATH VENTRIC 35X38 W/TROCAR LG (CATHETERS) IMPLANT
CLIP VESOCCLUDE MED 6/CT (CLIP) IMPLANT
CNTNR URN SCR LID CUP LEK RST (MISCELLANEOUS) ×2 IMPLANT
CONT SPEC 4OZ STRL OR WHT (MISCELLANEOUS) ×3
COVER BURR HOLE 7 (Orthopedic Implant) ×3 IMPLANT
COVER BURR HOLE UNIV 10 (Orthopedic Implant) ×3 IMPLANT
COVER WAND RF STERILE (DRAPES) ×3 IMPLANT
DECANTER SPIKE VIAL GLASS SM (MISCELLANEOUS) ×3 IMPLANT
DIFFUSER DRILL AIR PNEUMATIC (MISCELLANEOUS) ×3 IMPLANT
DRAIN SUBARACHNOID (WOUND CARE) IMPLANT
DRAPE CAMERA VIDEO/LASER (DRAPES) IMPLANT
DRAPE MICROSCOPE LEICA (MISCELLANEOUS) IMPLANT
DRAPE NEUROLOGICAL W/INCISE (DRAPES) ×3 IMPLANT
DRAPE ORTHO SPLIT 77X108 STRL (DRAPES)
DRAPE SURG 17X23 STRL (DRAPES) IMPLANT
DRAPE SURG ORHT 6 SPLT 77X108 (DRAPES) IMPLANT
DRAPE WARM FLUID 44X44 (DRAPES) ×3 IMPLANT
DURAPREP 6ML APPLICATOR 50/CS (WOUND CARE) ×3 IMPLANT
ELECT REM PT RETURN 9FT ADLT (ELECTROSURGICAL) ×3
ELECTRODE REM PT RTRN 9FT ADLT (ELECTROSURGICAL) ×2 IMPLANT
EVACUATOR 1/8 PVC DRAIN (DRAIN) IMPLANT
EVACUATOR SILICONE 100CC (DRAIN) IMPLANT
FORCEPS BIPOLAR SPETZLER 8 1.0 (NEUROSURGERY SUPPLIES) ×3 IMPLANT
GAUZE 4X4 16PLY RFD (DISPOSABLE) IMPLANT
GAUZE SPONGE 4X4 12PLY STRL (GAUZE/BANDAGES/DRESSINGS) ×3 IMPLANT
GLOVE ECLIPSE 6.5 STRL STRAW (GLOVE) ×3 IMPLANT
GLOVE EXAM NITRILE XL STR (GLOVE) IMPLANT
GOWN STRL REUS W/ TWL LRG LVL3 (GOWN DISPOSABLE) ×6 IMPLANT
GOWN STRL REUS W/ TWL XL LVL3 (GOWN DISPOSABLE) ×2 IMPLANT
GOWN STRL REUS W/TWL 2XL LVL3 (GOWN DISPOSABLE) IMPLANT
GOWN STRL REUS W/TWL LRG LVL3 (GOWN DISPOSABLE) ×9
GOWN STRL REUS W/TWL XL LVL3 (GOWN DISPOSABLE) ×3
GRAFT DURAGEN MATRIX 2WX2L ×3 IMPLANT
HEMOSTAT SURGICEL 2X14 (HEMOSTASIS) ×3 IMPLANT
HOOK DURA 1/2IN (MISCELLANEOUS) ×3 IMPLANT
KIT BASIN OR (CUSTOM PROCEDURE TRAY) ×3 IMPLANT
KIT DRAIN CSF ACCUDRAIN (MISCELLANEOUS) IMPLANT
KIT TURNOVER KIT B (KITS) ×3 IMPLANT
MARKER SPHERE PSV REFLC 13MM (MARKER) ×6 IMPLANT
NEEDLE HYPO 25X1 1.5 SAFETY (NEEDLE) ×3 IMPLANT
NEEDLE SPNL 18GX3.5 QUINCKE PK (NEEDLE) IMPLANT
NS IRRIG 1000ML POUR BTL (IV SOLUTION) ×9 IMPLANT
OIL CARTRIDGE MAESTRO DRILL (MISCELLANEOUS) ×3
PACK CRANIOTOMY CUSTOM (CUSTOM PROCEDURE TRAY) ×3 IMPLANT
PATTIES SURGICAL .25X.25 (GAUZE/BANDAGES/DRESSINGS) IMPLANT
PATTIES SURGICAL .5 X.5 (GAUZE/BANDAGES/DRESSINGS) IMPLANT
PATTIES SURGICAL .5 X3 (DISPOSABLE) IMPLANT
PATTIES SURGICAL 1/4 X 3 (GAUZE/BANDAGES/DRESSINGS) IMPLANT
PATTIES SURGICAL 1X1 (DISPOSABLE) IMPLANT
PLATE CMF SML 4H (Plate) ×3 IMPLANT
SCREW UNIII AXS SD 1.5X4 (Screw) ×45 IMPLANT
SPECIMEN JAR SMALL (MISCELLANEOUS) IMPLANT
SPONGE NEURO XRAY DETECT 1X3 (DISPOSABLE) IMPLANT
SPONGE SURGIFOAM ABS GEL 100 (HEMOSTASIS) ×3 IMPLANT
STAPLER VISISTAT 35W (STAPLE) ×3 IMPLANT
SUT ETHILON 3 0 FSL (SUTURE) IMPLANT
SUT ETHILON 3 0 PS 1 (SUTURE) IMPLANT
SUT NURALON 4 0 TR CR/8 (SUTURE) ×9 IMPLANT
SUT SILK 0 TIES 10X30 (SUTURE) IMPLANT
SUT STEEL 0 (SUTURE)
SUT STEEL 0 18XMFL TIE 17 (SUTURE) IMPLANT
SUT VIC AB 2-0 CT2 18 VCP726D (SUTURE) ×9 IMPLANT
TAPE SURG TRANSPORE 1 IN (GAUZE/BANDAGES/DRESSINGS) ×2 IMPLANT
TAPE SURGICAL TRANSPORE 1 IN (GAUZE/BANDAGES/DRESSINGS) ×3
TOWEL GREEN STERILE (TOWEL DISPOSABLE) ×3 IMPLANT
TOWEL GREEN STERILE FF (TOWEL DISPOSABLE) ×3 IMPLANT
TRAY FOLEY MTR SLVR 16FR STAT (SET/KITS/TRAYS/PACK) ×3 IMPLANT
TUBE CONNECTING 12X1/4 (SUCTIONS) ×3 IMPLANT
UNDERPAD 30X36 HEAVY ABSORB (UNDERPADS AND DIAPERS) ×3 IMPLANT
WATER STERILE IRR 1000ML POUR (IV SOLUTION) ×3 IMPLANT

## 2020-09-01 NOTE — Op Note (Signed)
09/01/2020  7:57 PM  PATIENT:  [name]  75 y.o. 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 He is admitted for resection of the mass PRE-OPERATIVE DIAGNOSIS:  Brain tumor  POST-OPERATIVE DIAGNOSIS:  Brain tumor  PROCEDURE:  Procedure(s): Right Temporal Craniotomy for Tumor with Brainlab APPLICATION OF CRANIAL NAVIGATION  SURGEON: Surgeon(s): Ashok Pall, MD Dawley, Theodoro Doing, DO  ASSISTANTS:Dawley, Trou  ANESTHESIA:   local and general  EBL:  Total I/O In: 1000 [I.V.:1000] Out: -   BLOOD ADMINISTERED:none  CELL SAVER GIVEN:none  COUNT:per nursing  DRAINS: none   SPECIMEN:  Source of Specimen:  right temporal lobe  DICTATION: [name] was taken to the operating room, intubated, and placed under a general anesthetic without difficulty. He was positioned supine on the operating room table. Once adequate anesthesia was obtained his head was placed in a three pin [place] head holder. His head was shaved, was prepped, and was draped in a sterile manner. I then localized his head using the brain lab system and the preoperative CT scan. Once his head was registered the craniotomy placement was planned.  With the stereotactic guidance I made a small reverse question Rabon incision. I developed the small flap, retracted it rostrally, and exposed the skull. I created burr holes to create a bone flap. I used the craniotome to remove the bone flap.  The dura was opened and based laterally. We were able with guidance and via visual inspection to identify the abnormal area. We removed the tumor in the following fashion. We used suction and cautery to remove the tumor given the stereotactic boundaries. When finished we appeared via the stereotactic guidance to have completed resection to the borders of the preoperative scanning. We achieved hemostasis in the resection cavity. We approximated the dura, then the bone flap with plates and screws. We approximated the temporalis  musculature, then closed the scalp with a galeal approximation and scalp edges with vicryl sutures, and ultimately with staples on the skin. I applied a sterile dressing. The head holder was removed. He was extubated, and brought to the pacu in good condition.   PLAN OF CARE: Admit to inpatient   PATIENT DISPOSITION:  PACU - hemodynamically stable.   Delay start of Pharmacological VTE agent (>24hrs) due to surgical blood loss or risk of bleeding:  no

## 2020-09-01 NOTE — Anesthesia Postprocedure Evaluation (Signed)
Anesthesia Post Note  Patient: [name]  Procedure(s) Performed: Right Temporal Craniotomy for Tumor with Brainlab (Right Head) APPLICATION OF CRANIAL NAVIGATION (N/A )     Patient location during evaluation: PACU Anesthesia Type: General Level of consciousness: sedated Pain management: pain level controlled Vital Signs Assessment: post-procedure vital signs reviewed and stable Respiratory status: spontaneous breathing and respiratory function stable Cardiovascular status: stable Postop Assessment: no apparent nausea or vomiting Anesthetic complications: no   No complications documented.  Last Vitals:  Vitals:   09/01/20 1945 09/01/20 2000  BP: (!) 116/56 116/65  Pulse: 65 69  Resp: 19 (!) 23  Temp:  36.8 C  SpO2: 98% 98%    Last Pain:  Vitals:   09/01/20 2000  TempSrc:   PainSc: Asleep                 Dontavia Brand DANIEL

## 2020-09-01 NOTE — Anesthesia Preprocedure Evaluation (Addendum)
Anesthesia Evaluation  Patient identified by MRN, date of birth, ID band Patient awake    Reviewed: Allergy & Precautions, NPO status , Patient's Chart, lab work & pertinent test results, reviewed documented beta blocker date and time   History of Anesthesia Complications Negative for: history of anesthetic complications  Airway Mallampati: I  TM Distance: >3 FB Neck ROM: Full    Dental  (+) Edentulous Upper, Edentulous Lower   Pulmonary sleep apnea and Continuous Positive Airway Pressure Ventilation , former smoker,  08/30/2020 SARS coronavirus NEG   breath sounds clear to auscultation       Cardiovascular hypertension, Pt. on medications and Pt. on home beta blockers (-) angina+ CAD and + CABG (2021)  + dysrhythmias  Rhythm:Regular Rate:Normal  07/2020 ECHO: EF 55 to 60%. The  left ventricle has normal function. There is mild concentric left ventricular hypertrophy, mod MR, mod TR   Neuro/Psych Depression Speech difficulty: temporal lesion    GI/Hepatic negative GI ROS, Neg liver ROS,   Endo/Other  diabetes (glu 108), Oral Hypoglycemic Agents  Renal/GU Renal InsufficiencyRenal diseasestones     Musculoskeletal  (+) Arthritis ,   Abdominal   Peds  Hematology Eliquis   Anesthesia Other Findings   Reproductive/Obstetrics                            Anesthesia Physical Anesthesia Plan  ASA: III  Anesthesia Plan: General   Post-op Pain Management:    Induction: Intravenous  PONV Risk Score and Plan: 2 and Ondansetron and Dexamethasone  Airway Management Planned: Oral ETT  Additional Equipment: Arterial line  Intra-op Plan:   Post-operative Plan: Extubation in OR  Informed Consent: I have reviewed the patients History and Physical, chart, labs and discussed the procedure including the risks, benefits and alternatives for the proposed anesthesia with the patient or authorized  representative who has indicated his/her understanding and acceptance.       Plan Discussed with: CRNA and Surgeon  Anesthesia Plan Comments:        Anesthesia Quick Evaluation

## 2020-09-01 NOTE — Anesthesia Procedure Notes (Signed)
Procedure Name: Intubation Date/Time: 09/01/2020 4:43 PM Performed by: Imagene Riches, CRNA Pre-anesthesia Checklist: Patient identified, Emergency Drugs available, Suction available and Patient being monitored Patient Re-evaluated:Patient Re-evaluated prior to induction Oxygen Delivery Method: Circle system utilized Preoxygenation: Pre-oxygenation with 100% oxygen Induction Type: IV induction Ventilation: Mask ventilation without difficulty and Oral airway inserted - appropriate to patient size Laryngoscope Size: Sabra Heck and 2 Grade View: Grade I Tube type: Oral Tube size: 7.5 mm Number of attempts: 1 Airway Equipment and Method: Stylet and Oral airway Placement Confirmation: ETT inserted through vocal cords under direct vision,  positive ETCO2 and breath sounds checked- equal and bilateral Secured at: 21 cm Tube secured with: Tape Dental Injury: Teeth and Oropharynx as per pre-operative assessment

## 2020-09-01 NOTE — Transfer of Care (Signed)
Immediate Anesthesia Transfer of Care Note  Patient: [name]  Procedure(s) Performed: Right Temporal Craniotomy for Tumor with Brainlab (Right Head) APPLICATION OF CRANIAL NAVIGATION (N/A )  Patient Location: PACU  Anesthesia Type:General  Level of Consciousness: awake and alert   Airway & Oxygen Therapy: Patient Spontanous Breathing and Patient connected to nasal cannula oxygen  Post-op Assessment: Report given to RN, Post -op Vital signs reviewed and stable and Patient moving all extremities  Post vital signs: Reviewed and stable  Last Vitals:  Vitals Value Taken Time  BP 171/88 09/01/20 1930  Temp 36.1 C 09/01/20 1930  Pulse 84 09/01/20 1940  Resp 22 09/01/20 1940  SpO2 97 % 09/01/20 1940  Vitals shown include unvalidated device data.  Last Pain:  Vitals:   09/01/20 1930  TempSrc:   PainSc: Asleep      Patients Stated Pain Goal: 5 (79/02/40 9735)  Complications: No complications documented.

## 2020-09-01 NOTE — H&P (Signed)
BP 128/77   Pulse 71   Temp 97.6 F (36.4 C) (Oral)   Resp 18   Ht 6' (1.829 m)   Wt 95.5 kg   SpO2 97%   BMI 28.56 kg/m   [name] comes in today for evaluation of a newly discovered mass in the posterior right temporal lobe with inhomogeneously enhancing mass.  [name] states that the scan was done secondary to the fact that he has been complaining of some memory problems.  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  As a result of this, an MRI scan was done which revealed a mass in the right temporal lobe.  [name] is left-handed and has had some mild difficulty he says with word-finding.  He otherwise has had no seizures.  He has had intermittent headaches, but certainly nothing which has been persistent.  The mass is measuring about 3 cm x 3 cm.     PHYSICAL EXAMINATION :  He weighs 219 pounds.  Temperature is 97.4, blood pressure 134/82, pulse 67.  Pain is 1/10.  He is alert and oriented by 4. Answers all questions appropriately.  He has no difficulty with speech today.  It is fluent.  He has no difficulty naming objects.  I did not check his short-term memory or long-term memory today.  He has no drift. 5/5 strength.  Reflexes 2+ throughout.  Pupils equal, round, and reactive to light.  Full extraocular movements.  The mass itself does enhance uniformly around its edges, but inhomogeneously within.  It has the appearance of a possible glioma.  It does not look like a met, but without knowing anything, of course, it is impossible to say what this is at this time.  There is little, if any, edema in the surrounding brain.  He had recently had a scan and then they brought him back to get the contrasted scan.       Given its location and given its appearance, this is something that I recommended that [name] have removed.  I do believe a gross total resection can be achieved.  If he is indeed left-hand  dominant, this may cause a speech deficit, but both he and I agree that an aggressive resection of this is the most important element at this time.  At which time we have a diagnosis and effective treatment can be instituted, but up until that time all we can do is speculate.  We will try to get this on for next week Wednesday.     SOCIAL HISTORY :  He is currently retired, 75 years of age, is a former smoker and quit in 1975.  Does not use alcohol.  No history of substance abuse.     PAST MEDICAL HISTORY :  He does have hypertension, diabetes, and prostate cancer.  He also has a history of atrial fibrillation, for which he takes Eliquis.     PAST SURGICAL HISTORY :  He had a mass removed.  He has had heart bypass and a left knee replacement.     ALLERGIES :  Sulfa drugs and Iodine cause a rash, but he had his MRI with gadolinium without difficulty.     FAMILY HISTORY :  Father died age 35 secondary to heart disease.  Mother died age 7 secondary to cancer.     REVIEW OF SYSTEMS :  Positive for hearing loss,  arthritis, painful urination, difficulty with urinary stream, and irregular pulse.     PLAN :  We will get [name] settled and arrange for a CT scan so that we can use the BrainLAB localization system to direct the mass resection.  The risks and benefits of bleeding, infection, stroke, coma, inability to resect the mass, recurrence of the mass, brain damage, change in his personality, change in speech, and change in motor skills all were discussed, along with other risks.  He understands and wishes to proceed.

## 2020-09-02 ENCOUNTER — Encounter (HOSPITAL_COMMUNITY): Payer: Self-pay | Admitting: Neurosurgery

## 2020-09-02 ENCOUNTER — Inpatient Hospital Stay (HOSPITAL_COMMUNITY): Payer: Medicare Other

## 2020-09-02 LAB — APTT: aPTT: 29 seconds (ref 24–36)

## 2020-09-02 LAB — PROTIME-INR
INR: 1.2 (ref 0.8–1.2)
Prothrombin Time: 15 seconds (ref 11.4–15.2)

## 2020-09-02 MED ORDER — ORAL CARE MOUTH RINSE
15.0000 mL | Freq: Two times a day (BID) | OROMUCOSAL | Status: DC
  Administered 2020-09-02 – 2020-09-06 (×10): 15 mL via OROMUCOSAL

## 2020-09-02 MED ORDER — CHLORHEXIDINE GLUCONATE CLOTH 2 % EX PADS
6.0000 | MEDICATED_PAD | Freq: Every day | CUTANEOUS | Status: DC
  Administered 2020-09-02 – 2020-09-07 (×7): 6 via TOPICAL

## 2020-09-02 MED ORDER — DEXAMETHASONE SODIUM PHOSPHATE 4 MG/ML IJ SOLN
4.0000 mg | Freq: Four times a day (QID) | INTRAMUSCULAR | Status: DC
  Administered 2020-09-02 – 2020-09-06 (×18): 4 mg via INTRAVENOUS
  Filled 2020-09-02 (×16): qty 1

## 2020-09-02 MED ORDER — CHLORHEXIDINE GLUCONATE 0.12 % MT SOLN
15.0000 mL | Freq: Two times a day (BID) | OROMUCOSAL | Status: DC
  Administered 2020-09-02 – 2020-09-06 (×10): 15 mL via OROMUCOSAL
  Filled 2020-09-02 (×5): qty 15

## 2020-09-02 MED ORDER — NICARDIPINE HCL IN NACL 20-0.86 MG/200ML-% IV SOLN
3.0000 mg/h | INTRAVENOUS | Status: DC
  Administered 2020-09-02 (×2): 5 mg/h via INTRAVENOUS
  Administered 2020-09-02: 15 mg/h via INTRAVENOUS
  Administered 2020-09-02: 9 mg/h via INTRAVENOUS
  Administered 2020-09-02: 3 mg/h via INTRAVENOUS
  Administered 2020-09-02: 10 mg/h via INTRAVENOUS
  Administered 2020-09-02: 7 mg/h via INTRAVENOUS
  Administered 2020-09-02: 14 mg/h via INTRAVENOUS
  Administered 2020-09-02 – 2020-09-03 (×5): 10 mg/h via INTRAVENOUS
  Administered 2020-09-05 (×2): 5 mg/h via INTRAVENOUS
  Administered 2020-09-05: 4 mg/h via INTRAVENOUS
  Filled 2020-09-02 (×6): qty 200
  Filled 2020-09-02: qty 400
  Filled 2020-09-02: qty 600
  Filled 2020-09-02 (×3): qty 200
  Filled 2020-09-02: qty 400
  Filled 2020-09-02: qty 200

## 2020-09-02 NOTE — Progress Notes (Addendum)
2345: Pt has been agitated and trying to get out of bed all night. This RN called for a posey belt order at 2345 and pt was placed in posey. Pt also complained of 8/10 pain at this time.   2351: 2mg  morphine given for pain and scheduled PO decadron was given. Pt had been tolerating PO fluids but was a little more lethargic at this time. After, meds were given this RN asked the patient to raise both arms and the patient raised only the Right arm. This RN noted Left hemiplegia. No movement to any stimulus. Verified with RN Edwena Felty and Chiropodist.   2355: Dr. Reatha Armour called and updated of these changes. He believes this could be seizures. STAT head CT ordered.  0000: STAT head CT showed new bleed, Code Stroke called by CT. Rapid RN, This RN, and MD Lindzen at [place]. Code Stroke canceled and MD Dawley made aware of CT results.   0100: Dr. Reatha Armour at bedside assessing patient now. Dr. Reatha Armour aware of all neuro changes Pt currently more somnolent with snoring respirations. Per Dr. Reatha Armour, cardene gtt for SBP <140 and q1h neuro checks. Repeat head CT at 0600. No other interventions at this time. RN will continue to monitor

## 2020-09-02 NOTE — Progress Notes (Signed)
   Providing Compassionate, Quality Care - Together  NEUROSURGERY PROGRESS NOTE   S: called by RN due to L sided weakness and lethargy and right gaze pref, sent for stat CT head, new IPH deep to resection cavity  O: EXAM:  BP 115/67   Pulse 86   Temp 97.6 F (36.4 C) (Axillary)   Resp 20   Ht 6' (1.829 m)   Wt 95.5 kg   SpO2 96%   BMI 28.56 kg/m   Eyes open to voice Non verbal, intermittently states name L facial droop PERRL Midline gaze spont RUE movement,  FC on R WD LLE to pain Min WD to pain in LUE Dressing c/d/i  ASSESSMENT:  75 y.o. male with   1. Right Temporal Tumor  S/p R crani for resection on 09/02/2020  C/b postop IPH  PLAN: - ICU - strict SBP <140, cardene ordered - neuro checks q1h - keppra - CT reviewed, new IPH deep to resection cavity with some midline shift, basal cisterns patent. Given the patients exam, will monitor closely and repeat CT in am -has been off eliquis since Saturday and aspirin since Sunday -attempted to reach wife however no answer at this time to update her -cont supportive care   Thank you for allowing me to participate in this patient's care.  Please do not hesitate to call with questions or concerns.   Elwin Sleight, [facility]

## 2020-09-02 NOTE — Progress Notes (Signed)
Patient ID: [name], male   DOB: 1946/02/28, 75 y.o.   MRN: [id number] BP (!) 122/58   Pulse 92   Temp (!) 97.5 F (36.4 C) (Axillary)   Resp 20   Ht 6' (1.829 m)   Wt 95.5 kg   SpO2 92%   BMI 28.56 kg/m  The events of last night and his neurological decline are known. He had a very large hemorrhage`into the motor area, and parietal region. The hematoma extends well above the sylvian fissure and posteriorly. He is plegic on his left side. Little if any speech. Will follow some commands on his right side. Strong consideration for taking him back now, but basal cisterns are widely patent, still unclear why he hemorrhaged post op. If progression the nursing staff knows to contact me.  Wound is clean, dry, no signs of infection. Will repeat scan in the am, closely monitor tonight.  CT HEAD WO CONTRAST  Result Date: 09/02/2020 CLINICAL DATA:  Craniotomy, postop EXAM: CT HEAD WITHOUT CONTRAST TECHNIQUE: Contiguous axial images were obtained from the base of the skull through the vertex without intravenous contrast. COMPARISON:  Same day CT head. FINDINGS: Brain: Interval increase in size of a large intraparenchymal hemorrhage centered in the right parietotemporal region, which measures up to 5.6 x 5.8 x 5.8 cm (approximately 94 cm^3). Small volume overlying subarachnoid hemorrhage. Increased small amount of intraventricular hemorrhage within the adjacent right lateral ventricle and layering in the left occipital horn. Surrounding edema and mass effect. Effacement of the right lateral ventricle without specific evidence of hydrocephalus. Leftward midline shift is similar, measuring 7 mm at the foramen of [place]. Similar pneumocephalus and surgical material subjacent to the right-sided craniotomy. No evidence of large vascular territory acute infarct. Vascular: Calcific atherosclerosis. Skull: Right-sided craniotomy. Sinuses/Orbits: Left maxillary sinus retention cyst. Otherwise, clear sinuses.  Unremarkable orbits. Other: No mastoid effusions. IMPRESSION: Interval increase in size of a large 5.8 cm intraparenchymal hemorrhage centered in the right parietotemporal region, as described above. Small volume overlying subarachnoid hemorrhage and increased small volume of intraventricular extension of hemorrhage in the adjacent right lateral ventricle and layering in the left occipital horn. Increased surrounding edema and mass effect with similar 7 mm of leftward midline shift. These results will be called to the ordering clinician or representative by the Radiologist Assistant, and communication documented in the PACS or [employer]. Electronically Signed   By: Margaretha Sheffield MD   On: 09/02/2020 08:55   CT HEAD WO CONTRAST  Result Date: 09/02/2020 CLINICAL DATA:  Recent tumor resection.  Left-sided weakness. EXAM: CT HEAD WITHOUT CONTRAST TECHNIQUE: Contiguous axial images were obtained from the base of the skull through the vertex without intravenous contrast. COMPARISON:  None. FINDINGS: Brain: Intraparenchymal hematoma centered in the right parietal/temporal lobe measures 4.1 x 6.3 x 5.0 cm (volume = 68 cm^3). There is leftward midline shift measuring 7 mm. Small volume pneumocephalus. There is periventricular hypoattenuation compatible with chronic microvascular disease. Vascular: No hyperdense vessel or unexpected calcification. Skull: Recent right-sided craniotomy. Sinuses/Orbits: Left maxillary retention cyst. Other: None IMPRESSION: 1. Large intraparenchymal hematoma centered in the right parietal/temporal lobe with 7 mm leftward midline shift. 2. Small volume pneumocephalus. 3. Critical Value/emergent results were called by telephone at the time of interpretation on 09/02/2020 at 12:39 am to provider TROY DAWLEY , who verbally acknowledged these results. Electronically Signed   By: Ulyses Jarred M.D.   On: 09/02/2020 00:40

## 2020-09-02 NOTE — Progress Notes (Signed)
Critical results from 4621 CT head scan called to this RN at 0905. Notified Dr. Christella Noa at [phone].

## 2020-09-02 NOTE — Progress Notes (Signed)
Received page for code stroke- arrived to CT: No IV needed, per RN.

## 2020-09-03 ENCOUNTER — Inpatient Hospital Stay (HOSPITAL_COMMUNITY): Payer: Medicare Other

## 2020-09-03 LAB — MAGNESIUM: Magnesium: 2.3 mg/dL (ref 1.7–2.4)

## 2020-09-03 LAB — PHOSPHORUS: Phosphorus: 2.8 mg/dL (ref 2.5–4.6)

## 2020-09-03 MED ORDER — ACETAMINOPHEN 650 MG RE SUPP: 650.0000 mg | RECTAL | Status: DC | PRN

## 2020-09-03 MED ORDER — PIVOT 1.5 CAL PO LIQD
1000.0000 mL | ORAL | Status: DC
  Administered 2020-09-03 – 2020-09-27 (×27): 1000 mL
  Filled 2020-09-03 (×16): qty 1000

## 2020-09-03 MED ORDER — DOCUSATE SODIUM 50 MG/5ML PO LIQD
100.0000 mg | Freq: Two times a day (BID) | ORAL | Status: DC
  Administered 2020-09-03 – 2020-09-10 (×12): 100 mg
  Filled 2020-09-03 (×12): qty 10

## 2020-09-03 MED ORDER — LOSARTAN POTASSIUM 50 MG PO TABS
100.0000 mg | ORAL_TABLET | Freq: Every day | ORAL | Status: DC
  Administered 2020-09-04 – 2020-09-07 (×4): 100 mg
  Filled 2020-09-03 (×4): qty 2

## 2020-09-03 MED ORDER — ONDANSETRON HCL 4 MG PO TABS: 4.0000 mg | ORAL_TABLET | ORAL | Status: DC | PRN

## 2020-09-03 MED ORDER — ADULT MULTIVITAMIN LIQUID CH
15.0000 mL | Freq: Every day | ORAL | Status: DC
  Administered 2020-09-03 – 2020-09-09 (×7): 15 mL
  Filled 2020-09-03 (×7): qty 15

## 2020-09-03 MED ORDER — METOPROLOL TARTRATE 25 MG PO TABS
12.5000 mg | ORAL_TABLET | Freq: Two times a day (BID) | ORAL | Status: DC
  Administered 2020-09-03 – 2020-09-07 (×8): 12.5 mg
  Filled 2020-09-03 (×8): qty 1

## 2020-09-03 MED ORDER — ONDANSETRON HCL 4 MG/2ML IJ SOLN: 4.0000 mg | INTRAMUSCULAR | Status: DC | PRN

## 2020-09-03 MED ORDER — GABAPENTIN 250 MG/5ML PO SOLN
100.0000 mg | Freq: Three times a day (TID) | ORAL | Status: DC
  Administered 2020-09-03 – 2020-09-06 (×11): 100 mg
  Filled 2020-09-03 (×12): qty 2

## 2020-09-03 MED ORDER — HYDROCODONE-ACETAMINOPHEN 5-325 MG PO TABS: 1.0000 | ORAL_TABLET | ORAL | Status: DC | PRN

## 2020-09-03 MED ORDER — ACETAMINOPHEN 325 MG PO TABS
650.0000 mg | ORAL_TABLET | ORAL | Status: DC | PRN
  Administered 2020-09-07 – 2020-09-29 (×12): 650 mg
  Filled 2020-09-03 (×12): qty 2

## 2020-09-03 MED ORDER — AMLODIPINE BESYLATE 5 MG PO TABS
5.0000 mg | ORAL_TABLET | Freq: Every day | ORAL | Status: DC
  Administered 2020-09-04 – 2020-09-07 (×4): 5 mg
  Filled 2020-09-03 (×4): qty 1

## 2020-09-03 MED ORDER — PROMETHAZINE HCL 25 MG PO TABS: 12.5000 mg | ORAL_TABLET | ORAL | Status: DC | PRN

## 2020-09-03 MED ORDER — SENNOSIDES-DOCUSATE SODIUM 8.6-50 MG PO TABS
1.0000 | ORAL_TABLET | Freq: Every evening | ORAL | Status: DC | PRN
  Administered 2020-09-06 – 2020-09-08 (×2): 1
  Filled 2020-09-03 (×3): qty 1

## 2020-09-03 MED ORDER — FUROSEMIDE 20 MG PO TABS
20.0000 mg | ORAL_TABLET | Freq: Every day | ORAL | Status: DC
  Administered 2020-09-04 – 2020-09-07 (×4): 20 mg
  Filled 2020-09-03 (×4): qty 1

## 2020-09-03 NOTE — Plan of Care (Signed)
  Problem: Safety: Goal: Non-violent Restraint(s) Outcome: Completed/Met

## 2020-09-03 NOTE — Progress Notes (Signed)
Patient ID: [name], male   DOB: 07/12/1945, 75 y.o.   MRN: [id number] BP (!) 101/48   Pulse 92   Temp 98 F (36.7 C) (Axillary)   Resp 15   Ht 6' (1.829 m)   Wt 95.5 kg   SpO2 95%   BMI 28.56 kg/m  Lethargic, purposeful, breathing easily. Plegic on left side.  Repeat CT shows more right to left shift. Basal cisterns remain patent Final pathology:Glioblastoma Multiforme Will consult neurooncology this weekend along with rad onc.  I have informed his wife of the pathology report.

## 2020-09-03 NOTE — Progress Notes (Signed)
Initial Nutrition Assessment  DOCUMENTATION CODES:   Not applicable  INTERVENTION:   Initiate tube feeding via Cortrak, once in place: Pivot 1.5 at 65 ml/h (1560 ml per day)  Provides 2340 kcal, 146 gm protein, 1184 ml free water daily   NUTRITION DIAGNOSIS:   Increased nutrient needs related to post-op healing as evidenced by estimated needs.  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   Pt discussed during ICU rounds and with RN.  Neurosurgery monitoring for possible repeat surgery.   No family present at time of visit.  Pt unable to answer any questions.  If admission weight accurate pt with 4% weight loss x 1 month; unable to confirm   4/20 s/p R craniotomy for tumor  4/21 pt with new L sided weakness and lethargy and R gaze preference, stat CT shows new large hemorrhage deep in resection cavity with midline shift  Medications reviewed and include: decadron, colace, lasix, metformin, prosight x 2, MVI with minerals NS with 20 mEq/L KCl @ 80 ml/hr   Labs reviewed    NUTRITION - FOCUSED PHYSICAL EXAM:  Flowsheet Row Most Recent Value  Orbital Region No depletion  Upper Arm Region Mild depletion  Thoracic and Lumbar Region No depletion  Buccal Region No depletion  Temple Region No depletion  Clavicle Bone Region Mild depletion  Clavicle and Acromion Bone Region Mild depletion  Scapular Bone Region Unable to assess  Dorsal Hand No depletion  Patellar Region No depletion  Anterior Thigh Region No depletion  Posterior Calf Region No depletion  Edema (RD Assessment) None  Hair Reviewed  Eyes Unable to assess  Mouth Unable to assess  Skin Reviewed   Nails Reviewed       Diet Order:   Diet Order            Diet NPO time specified  Diet effective now                 EDUCATION NEEDS:   No education needs have been identified at this time  Skin:  Skin Assessment: Reviewed RN Assessment  Last BM:  4/22  Height:   Ht Readings from Last 1 Encounters:  09/01/20 6' (1.829 m)    Weight:   Wt Readings from Last 1 Encounters:  09/01/20 95.5 kg    Ideal Body Weight:  80.9 kg  BMI:  Body mass index is 28.56 kg/m.  Estimated Nutritional Needs:   Kcal:  2200-2400  Protein:  120-140 grams  Fluid:  >2 L/day  Lockie Pares., RD, LDN, CNSC See AMiON for contact information

## 2020-09-03 NOTE — Plan of Care (Signed)
  Problem: Nutrition: Goal: Adequate nutrition will be maintained Outcome: Progressing   Problem: Elimination: Goal: Will not experience complications related to bowel motility Outcome: Progressing   

## 2020-09-03 NOTE — Procedures (Signed)
Cortrak  Person Inserting Tube:  Salam Micucci E, RD Tube Type:  Cortrak - 43 inches Tube Location:  Left nare Initial Placement:  Stomach Secured by: Bridle Technique Used to Measure Tube Placement:  Documented cm marking at nare/ corner of mouth Cortrak Secured At:  70 cm   Cortrak Tube Team Note:  Consult received to place a Cortrak feeding tube.   No x-ray is required. RN may begin using tube.   If the tube becomes dislodged please keep the tube and contact the Cortrak team at [email or web address] (password [email or web address]) for replacement.  If after hours and replacement cannot be delayed, place a NG tube and confirm placement with an abdominal x-ray.    Kent Riendeau, MS, RD, LDN RD pager number and weekend/on-call pager number located in Amion.    

## 2020-09-04 ENCOUNTER — Inpatient Hospital Stay (HOSPITAL_COMMUNITY): Payer: Medicare Other

## 2020-09-04 LAB — POCT I-STAT 7, (LYTES, BLD GAS, ICA,H+H)
Acid-base deficit: 1 mmol/L (ref 0.0–2.0)
Bicarbonate: 22.2 mmol/L (ref 20.0–28.0)
Calcium, Ion: 1.2 mmol/L (ref 1.15–1.40)
HCT: 40 % (ref 39.0–52.0)
Hemoglobin: 13.6 g/dL (ref 13.0–17.0)
O2 Saturation: 88 %
Potassium: 4.5 mmol/L (ref 3.5–5.1)
Sodium: 141 mmol/L (ref 135–145)
TCO2: 23 mmol/L (ref 22–32)
pCO2 arterial: 30.7 mmHg — ABNORMAL LOW (ref 32.0–48.0)
pH, Arterial: 7.466 — ABNORMAL HIGH (ref 7.350–7.450)
pO2, Arterial: 51 mmHg — ABNORMAL LOW (ref 83.0–108.0)

## 2020-09-04 LAB — MAGNESIUM
Magnesium: 2.4 mg/dL (ref 1.7–2.4)
Magnesium: 2.4 mg/dL (ref 1.7–2.4)

## 2020-09-04 LAB — SODIUM
Sodium: 141 mmol/L (ref 135–145)
Sodium: 141 mmol/L (ref 135–145)
Sodium: 144 mmol/L (ref 135–145)

## 2020-09-04 LAB — PHOSPHORUS
Phosphorus: 1.9 mg/dL — ABNORMAL LOW (ref 2.5–4.6)
Phosphorus: 2.4 mg/dL — ABNORMAL LOW (ref 2.5–4.6)

## 2020-09-04 MED ORDER — SODIUM CHLORIDE 3 % IV SOLN
INTRAVENOUS | Status: DC
  Filled 2020-09-04 (×6): qty 500

## 2020-09-04 NOTE — Progress Notes (Signed)
SLP Cancellation Note  Patient Details Name: [name] MRN: [id number] DOB: 05/04/1946   Cancelled treatment:       Reason Eval/Treat Not Completed: Patient not medically ready   Patirica Longshore, Katherene Ponto 09/04/2020, 10:58 AM

## 2020-09-04 NOTE — Progress Notes (Signed)
   Providing Compassionate, Quality Care - Together  NEUROSURGERY PROGRESS NOTE   S: Remains lethargic, wife at bedside, slightly labored breathing  O: EXAM:  BP (!) 160/81 (BP Location: Right Arm)   Pulse (!) 106   Temp 98.6 F (37 C) (Axillary)   Resp 20   Ht 6' (1.829 m)   Wt 103.5 kg   SpO2 94%   BMI 30.95 kg/m   Lethargic, eyes closed Pupils equally round reactive light Purposeful right-sided movement Hemiplegic on the left Does not follow commands Nonverbal  ASSESSMENT:  75 y.o. male with   1.  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  Please do not hesitate to call with questions or concerns.   Elwin Sleight, [facility]

## 2020-09-05 LAB — GLUCOSE, CAPILLARY
Glucose-Capillary: 202 mg/dL — ABNORMAL HIGH (ref 70–99)
Glucose-Capillary: 212 mg/dL — ABNORMAL HIGH (ref 70–99)
Glucose-Capillary: 224 mg/dL — ABNORMAL HIGH (ref 70–99)
Glucose-Capillary: 234 mg/dL — ABNORMAL HIGH (ref 70–99)
Glucose-Capillary: 243 mg/dL — ABNORMAL HIGH (ref 70–99)
Glucose-Capillary: 254 mg/dL — ABNORMAL HIGH (ref 70–99)
Glucose-Capillary: 259 mg/dL — ABNORMAL HIGH (ref 70–99)

## 2020-09-05 LAB — SODIUM
Sodium: 147 mmol/L — ABNORMAL HIGH (ref 135–145)
Sodium: 153 mmol/L — ABNORMAL HIGH (ref 135–145)
Sodium: 153 mmol/L — ABNORMAL HIGH (ref 135–145)
Sodium: 155 mmol/L — ABNORMAL HIGH (ref 135–145)

## 2020-09-05 LAB — MAGNESIUM: Magnesium: 2.7 mg/dL — ABNORMAL HIGH (ref 1.7–2.4)

## 2020-09-05 LAB — HEMOGLOBIN A1C
Hgb A1c MFr Bld: 5.8 % — ABNORMAL HIGH (ref 4.8–5.6)
Mean Plasma Glucose: 119.76 mg/dL

## 2020-09-05 LAB — PHOSPHORUS: Phosphorus: 2.2 mg/dL — ABNORMAL LOW (ref 2.5–4.6)

## 2020-09-05 MED ORDER — INSULIN ASPART 100 UNIT/ML ~~LOC~~ SOLN
0.0000 [IU] | Freq: Three times a day (TID) | SUBCUTANEOUS | Status: DC
  Administered 2020-09-05: 5 [IU] via SUBCUTANEOUS
  Administered 2020-09-05 – 2020-09-06 (×2): 8 [IU] via SUBCUTANEOUS
  Administered 2020-09-06: 5 [IU] via SUBCUTANEOUS

## 2020-09-05 NOTE — Plan of Care (Signed)
  Problem: Clinical Measurements: Goal: Respiratory complications will improve Outcome: Progressing   

## 2020-09-05 NOTE — Progress Notes (Signed)
Spoke with Dr. Reatha Armour regarding patient CBG of 243. No new orders received.

## 2020-09-05 NOTE — Progress Notes (Signed)
   Providing Compassionate, Quality Care - Together  NEUROSURGERY PROGRESS NOTE   S: No issues overnight. ,  O: EXAM:  BP 140/81 (BP Location: Right Arm)   Pulse 81   Temp (!) 96.8 F (36 C) (Axillary)   Resp (!) 23   Ht 6' (1.829 m)   Wt 103.5 kg   SpO2 97%   BMI 30.95 kg/m   Lethargic, eyes closed Pupils equally round reactive light Purposeful right-sided movement Hemiplegic on the left Does not follow commands Nonverbal  ASSESSMENT:  75 y.o. male with   1.  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  Please do not hesitate to call with questions or concerns.   Elwin Sleight, [facility] Cell: [phone]

## 2020-09-05 NOTE — Plan of Care (Signed)
?  Problem: Elimination: ?Goal: Will not experience complications related to urinary retention ?Outcome: Progressing ?  ?

## 2020-09-06 LAB — GLUCOSE, CAPILLARY
Glucose-Capillary: 234 mg/dL — ABNORMAL HIGH (ref 70–99)
Glucose-Capillary: 240 mg/dL — ABNORMAL HIGH (ref 70–99)
Glucose-Capillary: 246 mg/dL — ABNORMAL HIGH (ref 70–99)
Glucose-Capillary: 257 mg/dL — ABNORMAL HIGH (ref 70–99)
Glucose-Capillary: 275 mg/dL — ABNORMAL HIGH (ref 70–99)
Glucose-Capillary: 294 mg/dL — ABNORMAL HIGH (ref 70–99)

## 2020-09-06 LAB — BASIC METABOLIC PANEL
Anion gap: 12 (ref 5–15)
BUN: 51 mg/dL — ABNORMAL HIGH (ref 8–23)
CO2: 24 mmol/L (ref 22–32)
Calcium: 8.4 mg/dL — ABNORMAL LOW (ref 8.9–10.3)
Chloride: 124 mmol/L — ABNORMAL HIGH (ref 98–111)
Creatinine, Ser: 1.22 mg/dL (ref 0.61–1.24)
GFR, Estimated: >60 mL/min (ref >60)
Glucose, Bld: 274 mg/dL — ABNORMAL HIGH (ref 70–99)
Potassium: 3.9 mmol/L (ref 3.5–5.1)
Sodium: 160 mmol/L — ABNORMAL HIGH (ref 135–145)

## 2020-09-06 LAB — SODIUM
Sodium: 157 mmol/L — ABNORMAL HIGH (ref 135–145)
Sodium: 161 mmol/L (ref 135–145)

## 2020-09-06 MED ORDER — CLONIDINE HCL 0.1 MG PO TABS
0.1000 mg | ORAL_TABLET | Freq: Two times a day (BID) | ORAL | Status: DC
  Administered 2020-09-06 – 2020-09-07 (×2): 0.1 mg
  Filled 2020-09-06 (×2): qty 1

## 2020-09-06 MED ORDER — INSULIN ASPART 100 UNIT/ML ~~LOC~~ SOLN
0.0000 [IU] | SUBCUTANEOUS | Status: DC
  Administered 2020-09-06: 5 [IU] via SUBCUTANEOUS
  Administered 2020-09-06: 8 [IU] via SUBCUTANEOUS
  Administered 2020-09-07 (×2): 3 [IU] via SUBCUTANEOUS
  Administered 2020-09-07: 5 [IU] via SUBCUTANEOUS
  Administered 2020-09-07: 3 [IU] via SUBCUTANEOUS
  Administered 2020-09-07: 5 [IU] via SUBCUTANEOUS
  Administered 2020-09-08: 8 [IU] via SUBCUTANEOUS
  Administered 2020-09-08: 3 [IU] via SUBCUTANEOUS
  Administered 2020-09-08: 8 [IU] via SUBCUTANEOUS

## 2020-09-06 MED ORDER — SODIUM CHLORIDE 0.9 % IV SOLN
INTRAVENOUS | Status: DC | PRN
  Administered 2020-09-06 – 2020-09-11 (×3): 500 mL via INTRAVENOUS
  Administered 2020-09-14 – 2020-09-18 (×2): 250 mL via INTRAVENOUS

## 2020-09-06 MED ORDER — DEXAMETHASONE SODIUM PHOSPHATE 4 MG/ML IJ SOLN
4.0000 mg | Freq: Two times a day (BID) | INTRAMUSCULAR | Status: DC
  Administered 2020-09-06 – 2020-09-18 (×25): 4 mg via INTRAVENOUS
  Filled 2020-09-06 (×25): qty 1

## 2020-09-06 NOTE — Progress Notes (Signed)
CRITICAL VALUE STICKER  CRITICAL VALUE: Na+ 161  RECEIVER (on-site recipient of call): Caryl Comes RN  DATE & TIME NOTIFIED: 4/25 @ 1215  MESSENGER (representative from lab):  MD NOTIFIED: Ashok Pall MD  [place]: 4/25 @ 1215 via phone call  RESPONSE: stop 3% saline, restart maintenance fluids.

## 2020-09-06 NOTE — Plan of Care (Signed)
  Problem: Nutrition: Goal: Adequate nutrition will be maintained Outcome: Progressing   Problem: Pain Managment: Goal: General experience of comfort will improve Outcome: Progressing   Problem: Skin Integrity: Goal: Risk for impaired skin integrity will decrease Outcome: Progressing   Problem: Activity: Goal: Risk for activity intolerance will decrease Outcome: Not Progressing

## 2020-09-06 NOTE — Progress Notes (Signed)
Patient ID: [name], male   DOB: 1946-03-18, 75 y.o.   MRN: [id number] BP (!) 167/103   Pulse 100   Temp 99.6 F (37.6 C) (Axillary)   Resp (!) 27   Ht 6' (1.829 m)   Wt 103.5 kg   SpO2 92%   BMI 30.95 kg/m  Lethargic, responds by opening eyes to vigorous stimulation Perrl, full eom Moving right side, plegic on left Na was elevated due to 3%. Have stopped it, will proceed to OR tomorrow if no improvement Prolonged lethargy has not improved on its own.  Attempted to call his wife tonight, will speak to her tomorrow.  Have consulted neurooncology, and radiation oncology.  Results for orders placed or performed during the hospital encounter of 09/01/20 (from the past 24 hour(s))  Glucose, capillary     Status: Abnormal   Collection Time: 09/05/20  7:34 PM  Result Value Ref Range   Glucose-Capillary 212 (H) 70 - 99 mg/dL  Sodium     Status: Abnormal   Collection Time: 09/05/20 10:04 PM  Result Value Ref Range   Sodium 155 (H) 135 - 145 mmol/L  Glucose, capillary     Status: Abnormal   Collection Time: 09/05/20 11:32 PM  Result Value Ref Range   Glucose-Capillary 259 (H) 70 - 99 mg/dL  Glucose, capillary     Status: Abnormal   Collection Time: 09/06/20  3:30 AM  Result Value Ref Range   Glucose-Capillary 294 (H) 70 - 99 mg/dL  Sodium     Status: Abnormal   Collection Time: 09/06/20  4:49 AM  Result Value Ref Range   Sodium 157 (H) 135 - 145 mmol/L  Glucose, capillary     Status: Abnormal   Collection Time: 09/06/20  7:25 AM  Result Value Ref Range   Glucose-Capillary 275 (H) 70 - 99 mg/dL  Sodium     Status: Abnormal   Collection Time: 09/06/20  9:46 AM  Result Value Ref Range   Sodium 161 (HH) 135 - 145 mmol/L  Glucose, capillary     Status: Abnormal   Collection Time: 09/06/20 11:07 AM  Result Value Ref Range   Glucose-Capillary 240 (H) 70 - 99 mg/dL  Glucose, capillary     Status: Abnormal   Collection Time: 09/06/20  3:11 PM  Result Value Ref Range    Glucose-Capillary 257 (H) 70 - 99 mg/dL  Basic metabolic panel     Status: Abnormal   Collection Time: 09/06/20  3:36 PM  Result Value Ref Range   Sodium 160 (H) 135 - 145 mmol/L   Potassium 3.9 3.5 - 5.1 mmol/L   Chloride 124 (H) 98 - 111 mmol/L   CO2 24 22 - 32 mmol/L   Glucose, Bld 274 (H) 70 - 99 mg/dL   BUN 51 (H) 8 - 23 mg/dL   Creatinine, Ser 1.22 0.61 - 1.24 mg/dL   Calcium 8.4 (L) 8.9 - 10.3 mg/dL   GFR, Estimated >60 >60 mL/min   Anion gap 12 5 - 15

## 2020-09-06 NOTE — Progress Notes (Addendum)
Spoke with MD Ashok Pall- Verbal orders obtained via telephone to change dexamethasone order to BID and glycemic control to every four hours.

## 2020-09-06 NOTE — Progress Notes (Signed)
SLP Cancellation Note  Patient Details Name: [name] MRN: [id number] DOB: [birth year]   Cancelled treatment:        Checked with nurses who stated pt is not alert enough enough for po's at present. Will continue efforts.   [name] 09/06/2020, 9:34 AM   [name] Caroli.Ed Risk analyst [phone] Office [phone]

## 2020-09-07 ENCOUNTER — Inpatient Hospital Stay (HOSPITAL_COMMUNITY): Payer: Medicare Other

## 2020-09-07 ENCOUNTER — Inpatient Hospital Stay (HOSPITAL_COMMUNITY): Payer: Medicare Other | Admitting: Certified Registered Nurse Anesthetist

## 2020-09-07 ENCOUNTER — Inpatient Hospital Stay (HOSPITAL_COMMUNITY): Payer: Medicare Other | Admitting: Anesthesiology

## 2020-09-07 ENCOUNTER — Inpatient Hospital Stay (HOSPITAL_COMMUNITY)
Admission: RE | Disposition: A | Discharge status: Hospice | Payer: Self-pay | Source: Home / Self Care | Attending: Neurosurgery

## 2020-09-07 DIAGNOSIS — C719 Malignant neoplasm of brain, unspecified: Secondary | ICD-10-CM | POA: Diagnosis present

## 2020-09-07 DIAGNOSIS — J9601 Acute respiratory failure with hypoxia: Secondary | ICD-10-CM | POA: Diagnosis not present

## 2020-09-07 DIAGNOSIS — J189 Pneumonia, unspecified organism: Secondary | ICD-10-CM

## 2020-09-07 DIAGNOSIS — Z01818 Encounter for other preprocedural examination: Secondary | ICD-10-CM

## 2020-09-07 DIAGNOSIS — C712 Malignant neoplasm of temporal lobe: Secondary | ICD-10-CM | POA: Diagnosis present

## 2020-09-07 HISTORY — PX: CRANIOTOMY: SHX93

## 2020-09-07 LAB — CBC
HCT: 46.1 % (ref 39.0–52.0)
Hemoglobin: 14.6 g/dL (ref 13.0–17.0)
MCH: 30.5 pg (ref 26.0–34.0)
MCHC: 31.7 g/dL (ref 30.0–36.0)
MCV: 96.2 fL (ref 80.0–100.0)
Platelets: 133 10*3/uL — ABNORMAL LOW (ref 150–400)
RBC: 4.79 MIL/uL (ref 4.22–5.81)
RDW: 15.1 % (ref 11.5–15.5)
WBC: 18.4 10*3/uL — ABNORMAL HIGH (ref 4.0–10.5)
nRBC: 0 % (ref 0.0–0.2)

## 2020-09-07 LAB — SODIUM
Sodium: 162 mmol/L (ref 135–145)
Sodium: 162 mmol/L (ref 135–145)
Sodium: 161 mmol/L (ref 135–145)
Sodium: 159 mmol/L — ABNORMAL HIGH (ref 135–145)
Sodium: 160 mmol/L — ABNORMAL HIGH (ref 135–145)

## 2020-09-07 LAB — POCT I-STAT, CHEM 8
BUN: 45 mg/dL — ABNORMAL HIGH (ref 8–23)
Calcium, Ion: 1.19 mmol/L (ref 1.15–1.40)
Chloride: 126 mmol/L — ABNORMAL HIGH (ref 98–111)
Creatinine, Ser: 1.2 mg/dL (ref 0.61–1.24)
Glucose, Bld: 133 mg/dL — ABNORMAL HIGH (ref 70–99)
HCT: 38 % — ABNORMAL LOW (ref 39.0–52.0)
Hemoglobin: 12.9 g/dL — ABNORMAL LOW (ref 13.0–17.0)
Potassium: 3.9 mmol/L (ref 3.5–5.1)
Sodium: 163 mmol/L (ref 135–145)
TCO2: 25 mmol/L (ref 22–32)

## 2020-09-07 LAB — GLUCOSE, CAPILLARY
Glucose-Capillary: 212 mg/dL — ABNORMAL HIGH (ref 70–99)
Glucose-Capillary: 200 mg/dL — ABNORMAL HIGH (ref 70–99)
Glucose-Capillary: 164 mg/dL — ABNORMAL HIGH (ref 70–99)
Glucose-Capillary: 195 mg/dL — ABNORMAL HIGH (ref 70–99)
Glucose-Capillary: 116 mg/dL — ABNORMAL HIGH (ref 70–99)
Glucose-Capillary: 154 mg/dL — ABNORMAL HIGH (ref 70–99)

## 2020-09-07 LAB — SURGICAL PCR SCREEN
MRSA, PCR: NEGATIVE
Staphylococcus aureus: POSITIVE — AB

## 2020-09-07 LAB — TSH: TSH: 0.517 u[IU]/mL (ref 0.350–4.500)

## 2020-09-07 SURGERY — CRANIOTOMY HEMATOMA EVACUATION SUBDURAL
Anesthesia: General

## 2020-09-07 MED ORDER — LIDOCAINE-EPINEPHRINE 0.5 %-1:200000 IJ SOLN
INTRAMUSCULAR | Status: AC
  Filled 2020-09-07: qty 1

## 2020-09-07 MED ORDER — PHENYLEPHRINE HCL-NACL 10-0.9 MG/250ML-% IV SOLN
25.0000 ug/min | INTRAVENOUS | Status: DC
  Administered 2020-09-07: 40 ug/min via INTRAVENOUS
  Administered 2020-09-07: 100 ug/min via INTRAVENOUS
  Administered 2020-09-08: 40 ug/min via INTRAVENOUS
  Filled 2020-09-07: qty 500

## 2020-09-07 MED ORDER — FENTANYL CITRATE (PF) 100 MCG/2ML IJ SOLN
25.0000 ug | Freq: Once | INTRAMUSCULAR | Status: AC
Start: 2020-09-07 — End: 2020-09-07

## 2020-09-07 MED ORDER — PROPOFOL 500 MG/50ML IV EMUL
INTRAVENOUS | Status: DC | PRN
  Administered 2020-09-07: 25 ug/kg/min via INTRAVENOUS

## 2020-09-07 MED ORDER — MUPIROCIN 2 % EX OINT
1.0000 "application " | TOPICAL_OINTMENT | Freq: Two times a day (BID) | CUTANEOUS | Status: AC
  Administered 2020-09-07 – 2020-09-12 (×10): 1 via NASAL
  Filled 2020-09-07 (×2): qty 22

## 2020-09-07 MED ORDER — SODIUM CHLORIDE 0.9 % IV SOLN: INTRAVENOUS | Status: DC | PRN

## 2020-09-07 MED ORDER — THROMBIN 20000 UNITS EX SOLR
CUTANEOUS | Status: DC | PRN
  Administered 2020-09-07: 20 mL via TOPICAL

## 2020-09-07 MED ORDER — BACITRACIN ZINC 500 UNIT/GM EX OINT
TOPICAL_OINTMENT | CUTANEOUS | Status: AC
  Filled 2020-09-07: qty 28.35

## 2020-09-07 MED ORDER — VANCOMYCIN HCL 1000 MG/200ML IV SOLN: 1000.0000 mg | Freq: Once | INTRAVENOUS | Status: DC

## 2020-09-07 MED ORDER — FREE WATER
200.0000 mL | Freq: Four times a day (QID) | Status: DC
  Administered 2020-09-07 – 2020-09-09 (×11): 200 mL

## 2020-09-07 MED ORDER — 0.9 % SODIUM CHLORIDE (POUR BTL) OPTIME
TOPICAL | Status: DC | PRN
  Administered 2020-09-07 (×4): 1000 mL

## 2020-09-07 MED ORDER — SODIUM CHLORIDE 0.9 % IV SOLN
250.0000 mL | INTRAVENOUS | Status: DC
  Administered 2020-09-24: 250 mL via INTRAVENOUS

## 2020-09-07 MED ORDER — POLYETHYLENE GLYCOL 3350 17 G PO PACK: 17.0000 g | PACK | Freq: Every day | ORAL | Status: DC

## 2020-09-07 MED ORDER — SODIUM CHLORIDE 0.9 % IV SOLN: 1.0000 g | Freq: Three times a day (TID) | INTRAVENOUS | Status: DC

## 2020-09-07 MED ORDER — FENTANYL CITRATE (PF) 100 MCG/2ML IJ SOLN
25.0000 ug | INTRAMUSCULAR | Status: DC | PRN
  Administered 2020-09-09: 25 ug via INTRAVENOUS
  Filled 2020-09-07: qty 2

## 2020-09-07 MED ORDER — SODIUM CHLORIDE 0.9 % IV SOLN: 2.0000 g | INTRAVENOUS | Status: DC

## 2020-09-07 MED ORDER — VANCOMYCIN HCL 1750 MG/350ML IV SOLN
1750.0000 mg | INTRAVENOUS | Status: DC
  Administered 2020-09-07: 1750 mg via INTRAVENOUS
  Filled 2020-09-07: qty 350

## 2020-09-07 MED ORDER — CHLORHEXIDINE GLUCONATE 0.12 % MT SOLN
15.0000 mL | Freq: Two times a day (BID) | OROMUCOSAL | Status: DC
  Administered 2020-09-07: 15 mL via OROMUCOSAL

## 2020-09-07 MED ORDER — AMIODARONE LOAD VIA INFUSION: 150.0000 mg | Freq: Once | INTRAVENOUS | Status: DC

## 2020-09-07 MED ORDER — BACITRACIN ZINC 500 UNIT/GM EX OINT
TOPICAL_OINTMENT | CUTANEOUS | Status: DC | PRN
  Administered 2020-09-07: 1 via TOPICAL

## 2020-09-07 MED ORDER — ALBUMIN HUMAN 5 % IV SOLN: INTRAVENOUS | Status: DC | PRN

## 2020-09-07 MED ORDER — PROPOFOL 10 MG/ML IV BOLUS
INTRAVENOUS | Status: AC
  Filled 2020-09-07: qty 20

## 2020-09-07 MED ORDER — SODIUM CHLORIDE 0.9 % IV BOLUS
500.0000 mL | Freq: Once | INTRAVENOUS | Status: AC
  Administered 2020-09-07: 500 mL via INTRAVENOUS

## 2020-09-07 MED ORDER — AMIODARONE HCL IN DEXTROSE 360-4.14 MG/200ML-% IV SOLN
30.0000 mg/h | INTRAVENOUS | Status: DC
  Administered 2020-09-08 (×2): 30 mg/h via INTRAVENOUS
  Filled 2020-09-07 (×3): qty 200

## 2020-09-07 MED ORDER — PHENYLEPHRINE HCL-NACL 10-0.9 MG/250ML-% IV SOLN
INTRAVENOUS | Status: DC | PRN
  Administered 2020-09-07: 25 ug/min via INTRAVENOUS

## 2020-09-07 MED ORDER — ROCURONIUM BROMIDE 100 MG/10ML IV SOLN
INTRAVENOUS | Status: DC | PRN
  Administered 2020-09-07: 100 mg via INTRAVENOUS

## 2020-09-07 MED ORDER — AMIODARONE IV BOLUS ONLY 150 MG/100ML
150.0000 mg | Freq: Once | INTRAVENOUS | Status: AC
  Administered 2020-09-07: 150 mg via INTRAVENOUS
  Filled 2020-09-07 (×2): qty 100

## 2020-09-07 MED ORDER — SODIUM CHLORIDE 0.9 % IV SOLN
2.0000 g | Freq: Two times a day (BID) | INTRAVENOUS | Status: DC
  Administered 2020-09-07 – 2020-09-08 (×3): 2 g via INTRAVENOUS
  Filled 2020-09-07 (×3): qty 2

## 2020-09-07 MED ORDER — THROMBIN 5000 UNITS EX SOLR
OROMUCOSAL | Status: DC | PRN
  Administered 2020-09-07 (×3): 5 mL via TOPICAL

## 2020-09-07 MED ORDER — FENTANYL CITRATE (PF) 250 MCG/5ML IJ SOLN
INTRAMUSCULAR | Status: DC | PRN
  Administered 2020-09-07 (×2): 50 ug via INTRAVENOUS

## 2020-09-07 MED ORDER — PROPOFOL 10 MG/ML IV BOLUS
INTRAVENOUS | Status: DC | PRN
  Administered 2020-09-07: 100 mg via INTRAVENOUS

## 2020-09-07 MED ORDER — ONDANSETRON HCL 4 MG/2ML IJ SOLN
INTRAMUSCULAR | Status: DC | PRN
  Administered 2020-09-07: 4 mg via INTRAVENOUS

## 2020-09-07 MED ORDER — SUCCINYLCHOLINE CHLORIDE 20 MG/ML IJ SOLN
INTRAMUSCULAR | Status: DC | PRN
  Administered 2020-09-07: 100 mg via INTRAVENOUS

## 2020-09-07 MED ORDER — FENTANYL CITRATE (PF) 100 MCG/2ML IJ SOLN
25.0000 ug | INTRAMUSCULAR | Status: DC | PRN
  Administered 2020-09-07: 25 ug via INTRAVENOUS
  Filled 2020-09-07: qty 2

## 2020-09-07 MED ORDER — HEMOSTATIC AGENTS (NO CHARGE) OPTIME
TOPICAL | Status: DC | PRN
  Administered 2020-09-07: 1 via TOPICAL

## 2020-09-07 MED ORDER — THROMBIN 5000 UNITS EX SOLR
CUTANEOUS | Status: AC
  Filled 2020-09-07: qty 5000

## 2020-09-07 MED ORDER — THROMBIN 20000 UNITS EX SOLR
CUTANEOUS | Status: AC
  Filled 2020-09-07: qty 20000

## 2020-09-07 MED ORDER — PROPOFOL 1000 MG/100ML IV EMUL: 0.0000 ug/kg/min | INTRAVENOUS | Status: DC

## 2020-09-07 MED ORDER — SODIUM CHLORIDE 0.9 % IV SOLN: INTRAVENOUS | Status: DC

## 2020-09-07 MED ORDER — MAGNESIUM SULFATE 2 GM/50ML IV SOLN
2.0000 g | Freq: Once | INTRAVENOUS | Status: AC
  Administered 2020-09-07: 2 g via INTRAVENOUS
  Filled 2020-09-07: qty 50

## 2020-09-07 MED ORDER — ORAL CARE MOUTH RINSE
15.0000 mL | OROMUCOSAL | Status: DC
  Administered 2020-09-08 – 2020-09-13 (×58): 15 mL via OROMUCOSAL

## 2020-09-07 MED ORDER — CHLORHEXIDINE GLUCONATE 0.12% ORAL RINSE (MEDLINE KIT)
15.0000 mL | Freq: Two times a day (BID) | OROMUCOSAL | Status: DC
  Administered 2020-09-07 – 2020-09-13 (×12): 15 mL via OROMUCOSAL

## 2020-09-07 MED ORDER — FENTANYL BOLUS VIA INFUSION
25.0000 ug | INTRAVENOUS | Status: DC | PRN
  Filled 2020-09-07: qty 100

## 2020-09-07 MED ORDER — CEFAZOLIN SODIUM-DEXTROSE 2-3 GM-%(50ML) IV SOLR
INTRAVENOUS | Status: DC | PRN
  Administered 2020-09-07: 2 g via INTRAVENOUS

## 2020-09-07 MED ORDER — FENTANYL CITRATE (PF) 250 MCG/5ML IJ SOLN
INTRAMUSCULAR | Status: AC
  Filled 2020-09-07: qty 5

## 2020-09-07 MED ORDER — POLYETHYLENE GLYCOL 3350 17 G PO PACK
17.0000 g | PACK | Freq: Every day | ORAL | Status: DC
  Administered 2020-09-08 – 2020-09-13 (×5): 17 g
  Filled 2020-09-07 (×5): qty 1

## 2020-09-07 MED ORDER — AMIODARONE HCL IN DEXTROSE 360-4.14 MG/200ML-% IV SOLN
60.0000 mg/h | INTRAVENOUS | Status: AC
  Administered 2020-09-07: 30 mg/h via INTRAVENOUS
  Filled 2020-09-07: qty 200

## 2020-09-07 MED ORDER — THROMBIN 5000 UNITS EX SOLR
CUTANEOUS | Status: AC
  Filled 2020-09-07: qty 10000

## 2020-09-07 MED ORDER — FENTANYL 2500MCG IN NS 250ML (10MCG/ML) PREMIX INFUSION
25.0000 ug/h | INTRAVENOUS | Status: DC
  Administered 2020-09-07: 25 ug/h via INTRAVENOUS
  Filled 2020-09-07: qty 250

## 2020-09-07 MED ORDER — DOCUSATE SODIUM 50 MG/5ML PO LIQD: 100.0000 mg | Freq: Two times a day (BID) | ORAL | Status: DC

## 2020-09-07 MED ORDER — ORAL CARE MOUTH RINSE
15.0000 mL | Freq: Two times a day (BID) | OROMUCOSAL | Status: DC
  Administered 2020-09-07 (×2): 15 mL via OROMUCOSAL

## 2020-09-07 SURGICAL SUPPLY — 63 items
APL SKNCLS STERI-STRIP NONHPOA (GAUZE/BANDAGES/DRESSINGS)
BALL CTTN LRG ABS STRL LF (GAUZE/BANDAGES/DRESSINGS) ×1
BENZOIN TINCTURE PRP APPL 2/3 (GAUZE/BANDAGES/DRESSINGS) IMPLANT
BLADE CLIPPER SURG (BLADE) ×2 IMPLANT
BLADE ULTRA TIP 2M (BLADE) ×2 IMPLANT
BNDG CMPR 75X41 PLY ABS (GAUZE/BANDAGES/DRESSINGS) ×1
BNDG GAUZE ELAST 4 BULKY (GAUZE/BANDAGES/DRESSINGS) ×2 IMPLANT
BNDG STRETCH 4X75 NS LF (GAUZE/BANDAGES/DRESSINGS) ×2 IMPLANT
BUR ACORN 6.0 PRECISION (BURR) IMPLANT
BUR MATCHSTICK NEURO 3.0 LAGG (BURR) IMPLANT
BUR SPIRAL ROUTER 2.3 (BUR) IMPLANT
CANISTER SUCT 3000ML PPV (MISCELLANEOUS) ×2 IMPLANT
CARTRIDGE OIL MAESTRO DRILL (MISCELLANEOUS) ×1 IMPLANT
CLIP VESOCCLUDE MED 6/CT (CLIP) IMPLANT
COTTONBALL LRG STERILE PKG (GAUZE/BANDAGES/DRESSINGS) ×2 IMPLANT
COVER WAND RF STERILE (DRAPES) ×2 IMPLANT
DIFFUSER DRILL AIR PNEUMATIC (MISCELLANEOUS) ×2 IMPLANT
DRAPE NEUROLOGICAL W/INCISE (DRAPES) ×2 IMPLANT
DRAPE SURG 17X23 STRL (DRAPES) IMPLANT
DRAPE WARM FLUID 44X44 (DRAPES) ×2 IMPLANT
DURAPREP 6ML APPLICATOR 50/CS (WOUND CARE) ×2 IMPLANT
ELECT REM PT RETURN 9FT ADLT (ELECTROSURGICAL) ×2
ELECTRODE REM PT RTRN 9FT ADLT (ELECTROSURGICAL) ×1 IMPLANT
EVACUATOR 1/8 PVC DRAIN (DRAIN) IMPLANT
EVACUATOR SILICONE 100CC (DRAIN) IMPLANT
GAUZE 4X4 16PLY RFD (DISPOSABLE) IMPLANT
GAUZE SPONGE 4X4 12PLY STRL (GAUZE/BANDAGES/DRESSINGS) ×2 IMPLANT
GLOVE ECLIPSE 6.5 STRL STRAW (GLOVE) ×4 IMPLANT
GLOVE EXAM NITRILE XL STR (GLOVE) IMPLANT
GOWN STRL REUS W/ TWL LRG LVL3 (GOWN DISPOSABLE) ×2 IMPLANT
GOWN STRL REUS W/ TWL XL LVL3 (GOWN DISPOSABLE) ×1 IMPLANT
GOWN STRL REUS W/TWL 2XL LVL3 (GOWN DISPOSABLE) IMPLANT
GOWN STRL REUS W/TWL LRG LVL3 (GOWN DISPOSABLE) ×4
GOWN STRL REUS W/TWL XL LVL3 (GOWN DISPOSABLE) ×2
GRAFT DURAGEN MATRIX 3WX3L (Graft) ×2 IMPLANT
GRAFT DURAGEN MATRIX 3X3 SNGL (Graft) ×1 IMPLANT
HEMOSTAT SURGICEL 2X14 (HEMOSTASIS) ×2 IMPLANT
KIT BASIN OR (CUSTOM PROCEDURE TRAY) ×2 IMPLANT
KIT TURNOVER KIT B (KITS) ×2 IMPLANT
NEEDLE HYPO 25X1 1.5 SAFETY (NEEDLE) ×2 IMPLANT
NS IRRIG 1000ML POUR BTL (IV SOLUTION) ×8 IMPLANT
OIL CARTRIDGE MAESTRO DRILL (MISCELLANEOUS) ×2
PACK CRANIOTOMY CUSTOM (CUSTOM PROCEDURE TRAY) ×2 IMPLANT
PATTIES SURGICAL .5 X.5 (GAUZE/BANDAGES/DRESSINGS) IMPLANT
PATTIES SURGICAL .5 X3 (DISPOSABLE) IMPLANT
PATTIES SURGICAL 1X1 (DISPOSABLE) IMPLANT
SCREW UNIII AXS SD 1.5X4 (Screw) ×6 IMPLANT
SPONGE NEURO XRAY DETECT 1X3 (DISPOSABLE) IMPLANT
SPONGE SURGIFOAM ABS GEL 100 (HEMOSTASIS) ×2 IMPLANT
STAPLER VISISTAT 35W (STAPLE) ×2 IMPLANT
SUT ETHILON 3 0 FSL (SUTURE) IMPLANT
SUT ETHILON 3 0 PS 1 (SUTURE) IMPLANT
SUT NURALON 4 0 TR CR/8 (SUTURE) ×4 IMPLANT
SUT STEEL 0 (SUTURE)
SUT STEEL 0 18XMFL TIE 17 (SUTURE) IMPLANT
SUT VIC AB 2-0 CT2 18 VCP726D (SUTURE) ×6 IMPLANT
SYR BULB IRRIG 60ML STRL (SYRINGE) ×2 IMPLANT
TOWEL GREEN STERILE (TOWEL DISPOSABLE) ×2 IMPLANT
TOWEL GREEN STERILE FF (TOWEL DISPOSABLE) ×2 IMPLANT
TRAY FOLEY MTR SLVR 16FR STAT (SET/KITS/TRAYS/PACK) IMPLANT
TUBE CONNECTING 12X1/4 (SUCTIONS) ×2 IMPLANT
UNDERPAD 30X36 HEAVY ABSORB (UNDERPADS AND DIAPERS) IMPLANT
WATER STERILE IRR 1000ML POUR (IV SOLUTION) ×2 IMPLANT

## 2020-09-07 NOTE — Progress Notes (Signed)
Inpatient Diabetes Program Recommendations  AACE/ADA: New Consensus Statement on Inpatient Glycemic Control   Target Ranges:  Prepandial:   less than 140 mg/dL      Peak postprandial:   less than 180 mg/dL (1-2 hours)      Critically ill patients:  140 - 180 mg/dL   Results for [name], [name] (MRN [id number]) as of 09/07/2020 12:42  Ref. Range 09/06/2020 07:25 09/06/2020 11:07 09/06/2020 15:11 09/06/2020 19:13 09/06/2020 23:38 09/07/2020 03:16 09/07/2020 07:42 09/07/2020 11:27  Glucose-Capillary Latest Ref Range: 70 - 99 mg/dL 275 (H) 240 (H) 257 (H) 246 (H) 234 (H) 212 (H) 200 (H) 164 (H)   Review of Glycemic Control  Diabetes history: DM2 Outpatient Diabetes medications: Metformin 500 mg QAM Current orders for Inpatient glycemic control: Novolog 0-15 units Q4H; Decadron 4 mg BID, Pivot @ 65 ml/hr  Inpatient Diabetes Program Recommendations:    Insulin-Tube Feeding: Please consider ordering Novolog 4 units Q4H for tube feeding coverage. If tube feeding is stopped or held then Novolog tube feeding coverage should also be stopped or held.  Thanks, Sean Alderman, RN, MSN, CDE Diabetes Coordinator Inpatient Diabetes Program [phone] (Team Pager from 8am to 5pm)

## 2020-09-07 NOTE — Progress Notes (Signed)
Pt taken to OR with anesthesia.

## 2020-09-07 NOTE — Transfer of Care (Signed)
Immediate Anesthesia Transfer of Care Note  Patient: [name]  Procedure(s) Performed: CRANIOTOMY HEMATOMA EVACUATION SUBDURAL (N/A )  Patient Location: ICU  Anesthesia Type:General  Level of Consciousness: sedated, unresponsive and Patient remains intubated per anesthesia plan  Airway & Oxygen Therapy: Patient remains intubated per anesthesia plan and Patient placed on Ventilator (see vital sign flow sheet for setting)  Post-op Assessment: Report given to RN and Post -op Vital signs reviewed and stable  Post vital signs: Reviewed and stable. RN aware TOF 1/4, on Propofol/fentanyl for sedation- titrating Phenylephrine for B/P.  Last Vitals:  Vitals Value Taken Time  BP    Temp    Pulse    Resp    SpO2      Last Pain:  Vitals:   09/07/20 1600  TempSrc: Axillary  PainSc:       Patients Stated Pain Goal: 5 (41/63/84 5364)  Complications: No complications documented.

## 2020-09-07 NOTE — Anesthesia Procedure Notes (Signed)
Arterial Line Insertion Start/End4/26/2022 6:35 PM, 09/07/2020 6:45 PM Performed by: Roderic Palau, MD  Patient location: OR. Preanesthetic checklist: patient identified, IV checked, site marked, risks and benefits discussed, surgical consent, monitors and equipment checked, pre-op evaluation, timeout performed and anesthesia consent Lidocaine 1% used for infiltration Left, brachial was placed Catheter size: 20 Fr Hand hygiene performed  and maximum sterile barriers used   Attempts: 1 Procedure performed using ultrasound guided technique. Ultrasound Notes:anatomy identified, needle tip was noted to be adjacent to the nerve/plexus identified, no ultrasound evidence of intravascular and/or intraneural injection and image(s) printed for medical record Following insertion, dressing applied, Biopatch and line sutured. Post procedure assessment: normal and unchanged  Patient tolerated the procedure well with no immediate complications.

## 2020-09-07 NOTE — Progress Notes (Addendum)
Sodium level 161 from 160 earlier. 3% stopped 4/25 around noon. Paged neurosurgery on call. Waiting for call back  New order placed to start free water 200 mL every 6 hours

## 2020-09-07 NOTE — Progress Notes (Signed)
Pharmacy Antibiotic Note  [name] is a 75 y.o. male admitted on 09/01/2020 with brain mass; pt is S/P craniotomy for tumor resection on 3/82/50, complicated by intraparenchymal hemorrhage. Pt was intubated on 09/07/20  Pharmacy has been consulted for cefepime and vancomycin dosing for HCAP.  WBC 18.4, Tmax 100.4 F; Scr 1.22, CrCl 56.5 ml/min (renal function stable since admission)  Plan: Vancomycin 1750 mg IV Q 24 hrs (estimated vancomycin AUC on this regimen, using Scr 1.22, is 524.4; goal vancomycin AUC is 400-550) Cefepime 2 gm IV Q 12 hrs Monitor WBC, temp, clinical improvement, cultures, renal function, vancomycin levels as indicated  Height: 6' (182.9 cm) Weight: 90.3 kg (199 lb 1.2 oz) IBW/kg (Calculated) : 77.6  Temp (24hrs), Avg:99.1 F (37.3 C), Min:97.6 F (36.4 C), Max:100.4 F (38 C)  Recent Labs  Lab 09/01/20 1326 09/06/20 1536 09/07/20 0935  WBC 6.0  --  18.4*  CREATININE 1.31* 1.22  --     Estimated Creatinine Clearance: 56.5 mL/min (by C-G formula based on SCr of 1.22 mg/dL).    Allergies  Allergen Reactions  . Lisinopril Cough  . Other Other (See Comments)  . Sulfa Drugs Cross Reactors     CHILDHOOD UNSPECIFIED REACTION   . Contrast Media [Iodinated Diagnostic Agents] Rash  . Doxycycline Itching  . Ioxaglate Rash  . Tetracyclines & Related Rash    Microbiology results: 4/20 MRSA PCR: negative 4/18 COVID: negative 4/26 Trach aspirate cx: pending  Thank you for allowing pharmacy to be a part of this patient's care.  Gillermina Hu, PharmD, BCPS, [facility] Clinical Pharmacist 09/07/2020 5:06 PM

## 2020-09-07 NOTE — Op Note (Signed)
09/07/2020  8:54 PM  PATIENT:  [name]  75 y.o. male  PRE-OPERATIVE DIAGNOSIS:  HEMATOMA  POST-OPERATIVE DIAGNOSIS:  HEMATOMA  PROCEDURE:  Procedure(s): CRANIOTOMY HEMATOMA EVACUATION SUBDURAL  SURGEON: Surgeon(s): Ashok Pall, MD  ASSISTANTS:none  ANESTHESIA:   general  EBL:  Total I/O In: 500 [IV Piggyback:500] Out: 66 [Urine:330; Blood:550]  BLOOD ADMINISTERED:none  CELL SAVER GIVEN:none  COUNT:per nursing  DRAINS: none   SPECIMEN:  No Specimen  DICTATION: [name] was taken to the operating room,  and placed under a general anesthetic without difficulty. He was positioned supine with his head right side up. I removed the staples from the first operation.  His scalp was prepped and draped in a sterile manner. I used scissors to open the incision. I removed the plates and screws on the skull side and removed the craniotomy flap. I opened the dura. Using the CT as a guide I made a small corticectomy and with suction started to evacuate the hematoma. I removed the hematoma and controlled the bleeding along the walls of the resection cavity. I used surgifoam and surgicel to achieve hemostasis. The brain was well decompressed after removing the hematoma.  I applied duragen over the dural opening, approximated the bone flap with plates and screws, and approximated the temporalis fascia. I closed the scalp with galeal sutures and staples on the scalp edges. I applied a sterile dressing. He was taken directly to the ICU.   PLAN OF CARE: Admit to inpatient   PATIENT DISPOSITION:  ICU - intubated and hemodynamically stable.   Delay start of Pharmacological VTE agent (>24hrs) due to surgical blood loss or risk of bleeding:  yes

## 2020-09-07 NOTE — Progress Notes (Signed)
Patient ID: [name], male   DOB: 1945/12/28, 75 y.o.   MRN: [id number] BP (!) 138/98   Pulse 82   Temp 98.6 F (37 C) (Axillary)   Resp (!) 26   Ht 6' (1.829 m)   Wt 103.4 kg   SpO2 94%   BMI 30.92 kg/m  Lethargic, purposeful on right side. Will open eyes to moderate stimumlation Perrl,  Breathing comfortably.  Na remains elevated, over 160 As there has been no real improvement in LOC this past weekend I believe that hematoma evacuation will improve his overall status. I have explained this to his wife. She understands and agrees with the plan.

## 2020-09-07 NOTE — Anesthesia Procedure Notes (Signed)
Procedure Name: Intubation Date/Time: 09/07/2020 4:30 PM Performed by: Darletta Moll, CRNA Pre-anesthesia Checklist: Patient identified, Emergency Drugs available, Suction available and Patient being monitored Patient Re-evaluated:Patient Re-evaluated prior to induction Oxygen Delivery Method: Circle system utilized Preoxygenation: Pre-oxygenation with 100% oxygen Induction Type: IV induction Ventilation: Mask ventilation without difficulty Laryngoscope Size: Glidescope and 4 Tube type: Oral Tube size: 7.5 mm Number of attempts: 1 Airway Equipment and Method: Stylet Placement Confirmation: ETT inserted through vocal cords under direct vision,  positive ETCO2 and breath sounds checked- equal and bilateral Secured at: 22 cm Tube secured with: Tape Dental Injury: Teeth and Oropharynx as per pre-operative assessment

## 2020-09-07 NOTE — Progress Notes (Signed)
1600 - Pt having worsening tachypnea with respiratory rate in the 40s, not maintaining secretions, increased HR, and increased oxygen requirements. Reached out to MD Cabbell to inform. Anesthesia called.  1630 - Anesthesia at bedside to intubate.  Dexter Agarwala with CCM at bedside placing orders. Family at bedside.

## 2020-09-07 NOTE — Progress Notes (Signed)
eLink Physician-Brief Progress Note Patient Name: [name] DOB: 1945-12-11 MRN: [id number]   Date of Service  09/07/2020  HPI/Events of Note  Patient arrived from OR and has propofol and fentanyl infusing but propofol order missing.   eICU Interventions  Order placed. Has also arrived on low dose neosynephrine from OR, BP is stable at this time. I also wrote for an ABG. Patient is on PRVC VT 620 , RR 18, peep 8, Fio2 100 and with O2 sat 95 at this time. CXR shows ETT in good position. RN will let us know when ABG is done.      Intervention Category Major Interventions: Respiratory failure - evaluation and management  Margaretmary Lombard 09/07/2020, 9:02 PM

## 2020-09-07 NOTE — Progress Notes (Addendum)
  Amiodarone Drug - Drug Interaction Consult Note  Recommendations:  Monitor for bradycardia, AV block, myocardial depression while on labetalol  Monitor for QTc, monitor/replace K/Mg while on ondansetron  Amiodarone is metabolized by the cytochrome P450 system and therefore has the potential to cause many drug interactions. Amiodarone has an average plasma half-life of 50 days (range 20 to 100 days).   There is potential for drug interactions to occur several weeks or months after stopping treatment and the onset of drug interactions may be slow after initiating amiodarone.   []  Statins: Increased risk of myopathy. Simvastatin- restrict dose to 20mg  daily. Other statins: counsel patients to report any muscle pain or weakness immediately.  []  Anticoagulants: Amiodarone can increase anticoagulant effect. Consider warfarin dose reduction. Patients should be monitored closely and the dose of anticoagulant altered accordingly, remembering that amiodarone levels take several weeks to stabilize.  []  Antiepileptics: Amiodarone can increase plasma concentration of phenytoin, the dose should be reduced. Note that small changes in phenytoin dose can result in large changes in levels. Monitor patient and counsel on signs of toxicity.  [x]  Beta blockers: increased risk of bradycardia, AV block and myocardial depression. Sotalol - avoid concomitant use.  []   Calcium channel blockers (diltiazem and verapamil): increased risk of bradycardia, AV block and myocardial depression.  []   Cyclosporine: Amiodarone increases levels of cyclosporine. Reduced dose of cyclosporine is recommended.  []  Digoxin dose should be halved when amiodarone is started.  []  Diuretics: increased risk of cardiotoxicity if hypokalemia occurs.  []  Oral hypoglycemic agents (glyburide, glipizide, glimepiride): increased risk of hypoglycemia. Patient's glucose levels should be monitored closely when initiating amiodarone therapy.    [x]  Drugs that prolong the QT interval:  Torsades de pointes risk may be increased with concurrent use - avoid if possible.  Monitor QTc, also keep magnesium/potassium WNL if concurrent therapy can't be avoided. Marland Kitchen Antibiotics: e.g. fluoroquinolones, erythromycin. . Antiarrhythmics: e.g. quinidine, procainamide, disopyramide, sotalol. . Antipsychotics: e.g. phenothiazines, haloperidol.  . Lithium, tricyclic antidepressants, and methadone  Ondansetron  Gillermina Hu, PharmD, BCPS, [facility] Clinical Pharmacist  09/07/2020 7:14 PM

## 2020-09-07 NOTE — Consult Note (Signed)
NAME:  [name], MRN:  [id number], DOB:  Jul 18, 1945, LOS: 6 ADMISSION DATE:  09/01/2020, CONSULTATION DATE:  4/26 REFERRING MD:  Dr. Christella Noa, CHIEF COMPLAINT:  Airway protection issuess   History of Present Illness:  75 year old male with PMH as below, which is significant for three vessel CAD, prostate CA, OSA on CPAP, AF on apixaban, and HTN presented initially with complaints of memory loss and word finding issues. MRI 4/1 dmeonstration mass lesion in the right superior temporal gyrus with surrounding vasogenic edema and mass effect. The patiet was referred to neurosurgery and ultimately scheduled for surgery. He was admitted to [facility] 4/20 and taken to the OR under Dr. Christella Noa for craniotomy for tumor resection. The procedure was complicated by intraparenchymal hemorrhage. The patient was brought to the ICU for recovery and required hypertonic saline for the management of cerebral edema. He continued to have progressive decline of his neurologic status and ultimatly required intubation in the setting of poor airway protection on 4/26. PCCM consulted for vent management and ICU care.   Pertinent  Medical History   has a past medical history of Anticoagulant long-term use, Cancer ([place]), Coronary artery disease (cardiologist--- Truitt Merle PA), Depression, DOE (dyspnea on exertion), Feeling of incomplete bladder emptying, Full dentures, Heart murmur, History of kidney stones (2022), History of prostate cancer (1998), History of urethral stricture, Hypertension, Incisional hernia, Macular degeneration of both eyes, Nephrolithiasis, OSA on CPAP, Osteoarthritis (arthritis due to wear and tear of joints), Persistent atrial fibrillation ([place]), Slow urinary stream, Type 2 diabetes mellitus ([place]), 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   Significant Hospital Events: Including procedures, antibiotic start and stop dates in addition to other pertinent events   . 4/20  admit, to OR for resection of GBM . 4/21 IPH . 4/26 intubated  Interim History / Subjective:    Objective   Blood pressure 107/67, pulse (!) 107, temperature 98.8 F (37.1 C), temperature source Axillary, resp. rate (!) 21, height 6' (1.829 m), weight 90.3 kg, SpO2 97 %.    Vent Mode: PRVC FiO2 (%):  [40 %-100 %] 100 % Set Rate:  [18 bmp] 18 bmp Vt Set:  [620 mL] 620 mL PEEP:  [5 cmH20] 5 cmH20   Intake/Output Summary (Last 24 hours) at 09/07/2020 1654 Last data filed at 09/07/2020 1600 Gross per 24 hour  Intake 3934.22 ml  Output 2175 ml  Net 1759.22 ml   Filed Weights   09/06/20 1000 09/07/20 0500 09/07/20 1000  Weight: 91 kg 103.4 kg 90.3 kg    Examination: General: frail elderly appearing male in NAD on vent HENT: [place]/AT, PERRL, no JVD. Surgical stable to R temporal scalp.  Lungs: Coarse throughout Cardiovascular: Tachy, IRIR, no MRG. Frequent PVC Abdomen: Soft, non-tender, non-distended Extremities: No acute deformity Neuro: Sedated GU: Condom cath in place draining clear yellow urine.   Labs/imaging that I have personally reviewed  (right click and "Reselect all SmartList Selections" daily)  WBC 18.4 Na 161 iatrogenic plt 133 SCr 1.22 4/22 Ct head increased size of IPH with leftward shift and effacement of third ventricle. Similar small volume of overlying SAH and IVH to prior study.   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   Atrial fibrillation with RVR Three vessel CAD HTN - hold home apixaban - holding home amlodipine, asa, lasix, metoprolol, losartan anticipating hypotension post intubation.  - amiodarone bolus and drip, hopefully short duration.   Hypernatremia: iatrogenic from hypertonic saline.  - holding hypertonic for now  Nutrition - start TF  DM - CBG monitoring and SSI  OSA on CPAP - supportive care    Best practice (right click and "Reselect all SmartList Selections" daily)  Diet:  NPO Pain/Anxiety/Delirium protocol (if indicated): Yes (RASS goal -1,-2) VAP protocol (if indicated): Yes DVT prophylaxis: Contraindicated GI prophylaxis: PPI Glucose control:  SSI Yes Central venous access:  N/A Arterial line:  N/A Foley:  N/A Mobility:  bed rest  PT consulted: N/A Last date of multidisciplinary goals of care discussion [ per primary] Code Status:  full code Disposition: ICU  Labs   CBC: Recent Labs  Lab 09/01/20 1326 09/04/20 0956 09/07/20 0935  WBC 6.0  --  18.4*  HGB 14.4 13.6 14.6  HCT 43.7 40.0 46.1  MCV 92.4  --  96.2  PLT 98*  --  133*    Basic Metabolic Panel: Recent Labs  Lab 09/01/20 1326 09/03/20 1802 09/04/20 0446 09/04/20 0956 09/04/20 1005 09/04/20 1533 09/04/20 2116 09/05/20 0343 09/05/20 0955 09/06/20 1536 09/07/20 0046 09/07/20 0448 09/07/20 0935 09/07/20 1520  NA 138  --   --  141   < > 141   < > 147*   < > 160* 162* 162* 161* 159*  K 4.2  --   --  4.5  --   --   --   --   --  3.9  --   --   --   --   CL 106  --   --   --   --   --   --   --   --  124*  --   --   --   --   CO2 24  --   --   --   --   --   --   --   --  24  --   --   --   --   GLUCOSE 112*  --   --   --   --   --   --   --   --  274*  --   --   --   --   BUN 21  --   --   --   --   --   --   --   --  51*  --   --   --   --   CREATININE 1.31*  --   --   --   --   --   --   --   --  1.22  --   --   --   --   CALCIUM 9.0  --   --   --   --    --   --   --   --  8.4*  --   --   --   --   MG  --  2.3 2.4  --   --  2.4  --  2.7*  --   --   --   --   --   --   PHOS  --  2.8 1.9*  --   --  2.4*  --  2.2*  --   --   --   --   --   --    < > = values in this interval not displayed.   GFR: Estimated Creatinine Clearance: 56.5 mL/min (by C-G formula based on SCr of 1.22 mg/dL). Recent Labs  Lab 09/01/20 1326 09/07/20 0935  WBC 6.0 18.4*    Liver Function Tests: Recent Labs  Lab 09/01/20 1326  AST 26  ALT 29  ALKPHOS 70  BILITOT 1.2  PROT 6.3*  ALBUMIN 3.7   No results for input(s): LIPASE, AMYLASE in the last 168 hours. No results for input(s): AMMONIA in the last 168 hours.  ABG    Component Value Date/Time   PHART 7.466 (H) 09/04/2020 0956   PCO2ART 30.7 (L) 09/04/2020 0956   PO2ART 51 (L) 09/04/2020 0956   HCO3 22.2 09/04/2020 0956   TCO2 23 09/04/2020 0956   ACIDBASEDEF 1.0 09/04/2020 0956   O2SAT 88.0 09/04/2020 0956     Coagulation Profile: Recent Labs  Lab 09/02/20 1325  INR 1.2    Cardiac Enzymes: No results for input(s): CKTOTAL, CKMB, CKMBINDEX, TROPONINI in the last 168 hours.  HbA1C: Hgb A1c MFr Bld  Date/Time Value Ref Range Status  09/05/2020 10:05 AM 5.8 (H) 4.8 - 5.6 % Final    Comment:    (NOTE) Pre diabetes:          5.7%-6.4%  Diabetes:              >6.4%  Glycemic control for   <7.0% adults with diabetes   05/23/2019 12:16 PM 5.5 4.8 - 5.6 % Final    Comment:    (NOTE) Pre diabetes:          5.7%-6.4% Diabetes:              >6.4% Glycemic control for   <7.0% adults with diabetes     CBG: Recent Labs  Lab 09/06/20 2338 09/07/20 0316 09/07/20 0742 09/07/20 1127 09/07/20 1525  GLUCAP 234* 212* 200* 164* 195*    Review of Systems:   Patient is encephalopathic and/or intubated. Therefore history has been obtained from chart review.    Past Medical History:  He,  has a past medical history of Anticoagulant long-term use, Cancer ([place]), Coronary artery disease  (cardiologist--- Truitt Merle PA), Depression, DOE (dyspnea on exertion), Feeling of incomplete bladder emptying, Full dentures, Heart murmur, History of kidney stones (2022), History of prostate cancer (1998), History of urethral stricture, Hypertension, Incisional hernia, Macular degeneration of both eyes, Nephrolithiasis, OSA on CPAP, Osteoarthritis (arthritis due to wear and tear of joints), Persistent atrial fibrillation ([place]), Slow urinary stream, Type 2 diabetes mellitus ([place]), 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   Surgical History:   Past Surgical History:  Procedure Laterality Date  . APPLICATION OF CRANIAL NAVIGATION N/A 09/01/2020   Procedure: APPLICATION OF CRANIAL NAVIGATION;  Surgeon: Ashok Pall, MD;  Location: [facility];  Service: Neurosurgery;  Laterality: N/A;  . CATARACT EXTRACTION W/ INTRAOCULAR LENS  IMPLANT, BILATERAL  2019  . CLIPPING OF ATRIAL APPENDAGE N/A 05/27/2019   Procedure: CLIPPING OF ATRIAL APPENDAGE - Using AtriCure Clip size 45MM;  Surgeon: Grace Isaac, MD;  Location: [facility];  Service: Open Heart  Surgery;  Laterality: N/A;  . CORONARY ARTERY BYPASS GRAFT N/A 05/27/2019   Procedure: CORONARY ARTERY BYPASS GRAFTING (CABG) x Three , using left internal mammary artery and right leg greater saphenous vein harvested endoscopically LIMA to LAD, SVG to DIAG, SVG to RCA. Incision of left internal mammary lymph node Incision of Left Internal  Mediastinal Mass;  Surgeon: Grace Isaac, MD;  Location: [facility];  Service: Open Heart Surgery;  Laterality: N/A;  . CRANIOTOMY Right 09/01/2020   Procedure: Right Temporal Craniotomy for Tumor with Brainlab;  Surgeon: Ashok Pall, MD;  Location: [facility];  Service: Neurosurgery;  Laterality: Right;  . CYSTOSCOPY W/ RETROGRADES  04/04/2012   Procedure: CYSTOSCOPY WITH RETROGRADE PYELOGRAM;  Surgeon: Molli Hazard, MD;  Location: [facility];  Service: Urology;  Laterality: Bilateral;  .  CYSTOSCOPY WITH RETROGRADE PYELOGRAM, URETEROSCOPY AND STENT PLACEMENT  04/04/2012   Procedure: CYSTOSCOPY WITH RETROGRADE PYELOGRAM, URETEROSCOPY AND STENT PLACEMENT;  Surgeon: Molli Hazard, MD;  Location: [facility];  Service: Urology;  Laterality: Right;  . EXCISION MASS ABDOMINAL N/A 05/19/2020   Procedure: EXCISION OF SUBCUTANEOUS MASS-ABDOMEN;  Surgeon: Ileana Roup, MD;  Location: [facility];  Service: General;  Laterality: N/A;  . LEFT HEART CATH AND CORONARY ANGIOGRAPHY N/A 03/14/2019   Procedure: LEFT HEART CATH AND CORONARY ANGIOGRAPHY;  Surgeon: Belva Crome, MD;  Location: [facility];  Service: Cardiovascular;  Laterality: N/A;  . LUMBAR LAMINECTOMY/DECOMPRESSION MICRODISCECTOMY N/A 09/27/2017   Procedure: LAMINECTOMY LUMBAR THREE- LUMBAR FOUR, LUMBAR FOUR- LUMBAR FIVE;  Surgeon: Ashok Pall, MD;  Location: [facility];  Service: Neurosurgery;  Laterality: N/A;  . RADIOACTIVE PROSTATE SEED IMPLANTS  1998  . ROTATOR CUFF REPAIR Left approx. 2010  . TEE WITHOUT CARDIOVERSION N/A 05/27/2019   Procedure: TRANSESOPHAGEAL ECHOCARDIOGRAM (TEE);  Surgeon: Grace Isaac, MD;  Location: [facility];  Service: Open Heart Surgery;  Laterality: N/A;  . TOTAL KNEE ARTHROPLASTY Left 08/06/2014   Procedure: LEFT TOTAL KNEE ARTHROPLASTY;  Surgeon: Susa Day, MD;  Location: [facility];  Service: Orthopedics;  Laterality: Left;     Social History:   reports that he quit smoking about 47 years ago. His smoking use included cigarettes. He quit after 6.00 years of use. He has never used smokeless tobacco. He reports current alcohol use. He reports that he does not use drugs.   Family History:  His family history includes Cancer in his mother; Colon cancer in his mother; Dementia in his mother; Heart attack in his father and sister; Stroke in his sister. There is no history of Esophageal cancer, Rectal cancer, or Stomach cancer.   Allergies Allergies  Allergen Reactions  .  Lisinopril Cough  . Other Other (See Comments)  . Sulfa Drugs Cross Reactors     CHILDHOOD UNSPECIFIED REACTION   . Contrast Media [Iodinated Diagnostic Agents] Rash  . Doxycycline Itching  . Ioxaglate Rash  . Tetracyclines & Related Rash     Home Medications  Prior to Admission medications   Medication Sig Start Date End Date Taking? Authorizing Provider  allopurinol (ZYLOPRIM) 300 MG tablet Take 300 mg by mouth daily.   Yes Leanna Battles, MD  amLODipine (NORVASC) 5 MG tablet Take 1 tablet (5 mg total) by mouth daily. 07/13/20  Yes Freada Bergeron, MD  apixaban (ELIQUIS) 5 MG TABS tablet Take 1 tablet (5 mg total) by mouth 2 (two) times daily. Patient taking differently: Take 5 mg by mouth 2 (two) times daily. 08/23/20  Yes Pemberton,  Greer Ee, MD  aspirin EC 81 MG EC tablet Take 1 tablet (81 mg total) by mouth daily. 06/02/19  Yes Conte, Tessa N, PA-C  atorvastatin (LIPITOR) 40 MG tablet Take 1 tablet (40 mg total) by mouth daily at 6 PM. 12/04/19  Yes Gerhardt, Marlane Hatcher, NP  furosemide (LASIX) 20 MG tablet TAKE 1 TABLET BY MOUTH  DAILY Patient taking differently: Take 20 mg by mouth daily. 06/23/20  Yes Burtis Junes, NP  gabapentin (NEURONTIN) 100 MG capsule Take 100-300 mg by mouth See admin instructions. Take 1 capsule (100 mg) by mouth 3 times daily for one week,then increase to 2 capsules (200 mg) by mouth 3 times daily for one week, then increase to 3 capsules (300 mg) by mouth 3 times daily if tolerated. 08/26/20  Yes [provider]  HYDROcodone-acetaminophen (NORCO) 10-325 MG tablet Take 1 tablet by mouth 3 (three) times daily as needed for severe pain. 07/16/20  Yes [provider]  losartan (COZAAR) 100 MG tablet Take 1 tablet (100 mg total) by mouth daily. 07/13/20  Yes Freada Bergeron, MD  metFORMIN (GLUCOPHAGE) 1000 MG tablet Take 500 mg by mouth daily with breakfast.   Yes [provider]  metoprolol succinate (TOPROL XL) 25 MG 24 hr tablet  Take 1 tablet (25 mg total) by mouth at bedtime. Patient taking differently: Take 25 mg by mouth at bedtime. 08/13/20  Yes Freada Bergeron, MD  Multiple Vitamin (MULTIVITAMIN WITH MINERALS) TABS tablet Take 1 tablet by mouth daily. Centrum Silver   Yes [provider]  Multiple Vitamins-Minerals (PRESERVISION AREDS 2) CAPS Take 1 capsule by mouth 2 (two) times daily.    Yes [provider]  ondansetron (ZOFRAN ODT) 4 MG disintegrating tablet Take 1 tablet (4 mg total) by mouth every 8 (eight) hours as needed for nausea or vomiting. 05/01/20  Yes Fransico Meadow, PA-C  oxyCODONE-acetaminophen (PERCOCET) 5-325 MG tablet Take 1 tablet by mouth every 4 (four) hours as needed for severe pain. 05/01/20 05/01/21 Yes Fransico Meadow, PA-C  predniSONE (DELTASONE) 10 MG tablet Take 10 mg by mouth 2 (two) times daily. 08/25/20  Yes [provider]  tamsulosin (FLOMAX) 0.4 MG CAPS capsule Take 1 capsule (0.4 mg total) by mouth daily. 05/01/20  Yes Fransico Meadow, PA-C     Critical care time: 39 minutes     Georgann Housekeeper, AGACNP-BC [facility]  See Amion for personal pager PCCM on call pager [phone] until 7pm. Please call Elink 7p-7a. [phone]  09/07/2020 5:24 PM

## 2020-09-07 NOTE — Anesthesia Postprocedure Evaluation (Signed)
Anesthesia Post Note  Patient: [name]  Procedure(s) Performed: CRANIOTOMY HEMATOMA EVACUATION SUBDURAL (N/A )     Patient location during evaluation: SICU Anesthesia Type: General Level of consciousness: sedated Pain management: pain level controlled Vital Signs Assessment: post-procedure vital signs reviewed and stable Respiratory status: patient remains intubated per anesthesia plan Cardiovascular status: tachycardic Postop Assessment: no apparent nausea or vomiting Anesthetic complications: no   No complications documented.  Last Vitals:  Vitals:   09/07/20 2030 09/07/20 2039  BP:    Pulse:    Resp:    Temp: (!) 38.3 C   SpO2:  95%    Last Pain:  Vitals:   09/07/20 2030  TempSrc: Axillary  PainSc:                  Abas Leicht,W. EDMOND

## 2020-09-07 NOTE — Anesthesia Preprocedure Evaluation (Signed)
Anesthesia Evaluation  Patient identified by MRN, date of birth, ID band Patient unresponsive    Reviewed: Allergy & Precautions, H&P , NPO status , Patient's Chart, lab work & pertinent test results, reviewed documented beta blocker date and time   Airway Mallampati: Intubated       Dental no notable dental hx. (+) Teeth Intact, Dental Advisory Given   Pulmonary sleep apnea , former smoker,  Intubated on vent   Pulmonary exam normal breath sounds clear to auscultation       Cardiovascular hypertension, Pt. on medications and Pt. on home beta blockers + CAD, + CABG and + DOE  + dysrhythmias Atrial Fibrillation  Rhythm:Irregular Rate:Tachycardia     Neuro/Psych Depression negative neurological ROS     GI/Hepatic negative GI ROS, Neg liver ROS,   Endo/Other  diabetes, Oral Hypoglycemic Agents  Renal/GU Renal disease  negative genitourinary   Musculoskeletal  (+) Arthritis , Osteoarthritis,    Abdominal   Peds  Hematology negative hematology ROS (+)   Anesthesia Other Findings   Reproductive/Obstetrics negative OB ROS                             Anesthesia Physical Anesthesia Plan  ASA: IV  Anesthesia Plan: General   Post-op Pain Management:    Induction: Intravenous  PONV Risk Score and Plan: 2 and Treatment may vary due to age or medical condition  Airway Management Planned: Oral ETT  Additional Equipment: Arterial line  Intra-op Plan:   Post-operative Plan: Post-operative intubation/ventilation  Informed Consent: I have reviewed the patients History and Physical, chart, labs and discussed the procedure including the risks, benefits and alternatives for the proposed anesthesia with the patient or authorized representative who has indicated his/her understanding and acceptance.     Dental advisory given  Plan Discussed with: CRNA  Anesthesia Plan Comments:          Anesthesia Quick Evaluation

## 2020-09-08 ENCOUNTER — Inpatient Hospital Stay (HOSPITAL_COMMUNITY): Payer: Medicare Other

## 2020-09-08 ENCOUNTER — Encounter (HOSPITAL_COMMUNITY): Payer: Self-pay | Admitting: Neurosurgery

## 2020-09-08 DIAGNOSIS — Z01818 Encounter for other preprocedural examination: Secondary | ICD-10-CM | POA: Diagnosis not present

## 2020-09-08 DIAGNOSIS — C719 Malignant neoplasm of brain, unspecified: Secondary | ICD-10-CM | POA: Diagnosis not present

## 2020-09-08 DIAGNOSIS — J189 Pneumonia, unspecified organism: Secondary | ICD-10-CM

## 2020-09-08 DIAGNOSIS — R0902 Hypoxemia: Secondary | ICD-10-CM

## 2020-09-08 DIAGNOSIS — Z9889 Other specified postprocedural states: Secondary | ICD-10-CM

## 2020-09-08 LAB — BASIC METABOLIC PANEL
Anion gap: 7 (ref 5–15)
BUN: 61 mg/dL — ABNORMAL HIGH (ref 8–23)
CO2: 21 mmol/L — ABNORMAL LOW (ref 22–32)
Calcium: 7.7 mg/dL — ABNORMAL LOW (ref 8.9–10.3)
Chloride: 127 mmol/L — ABNORMAL HIGH (ref 98–111)
Creatinine, Ser: 1.64 mg/dL — ABNORMAL HIGH (ref 0.61–1.24)
GFR, Estimated: 43 mL/min — ABNORMAL LOW (ref >60)
Glucose, Bld: 283 mg/dL — ABNORMAL HIGH (ref 70–99)
Potassium: 4.1 mmol/L (ref 3.5–5.1)
Sodium: 155 mmol/L — ABNORMAL HIGH (ref 135–145)

## 2020-09-08 LAB — CBC WITH DIFFERENTIAL/PLATELET
Abs Immature Granulocytes: 0.14 10*3/uL — ABNORMAL HIGH (ref 0.00–0.07)
Basophils Absolute: 0 10*3/uL (ref 0.0–0.1)
Basophils Relative: 0 %
Eosinophils Absolute: 0 10*3/uL (ref 0.0–0.5)
Eosinophils Relative: 0 %
HCT: 36.1 % — ABNORMAL LOW (ref 39.0–52.0)
Hemoglobin: 11.1 g/dL — ABNORMAL LOW (ref 13.0–17.0)
Immature Granulocytes: 1 %
Lymphocytes Relative: 6 %
Lymphs Abs: 0.7 10*3/uL (ref 0.7–4.0)
MCH: 30.3 pg (ref 26.0–34.0)
MCHC: 30.7 g/dL (ref 30.0–36.0)
MCV: 98.6 fL (ref 80.0–100.0)
Monocytes Absolute: 0.8 10*3/uL (ref 0.1–1.0)
Monocytes Relative: 6 %
Neutro Abs: 11.1 10*3/uL — ABNORMAL HIGH (ref 1.7–7.7)
Neutrophils Relative %: 87 %
Platelets: 98 10*3/uL — ABNORMAL LOW (ref 150–400)
RBC: 3.66 MIL/uL — ABNORMAL LOW (ref 4.22–5.81)
RDW: 15.1 % (ref 11.5–15.5)
WBC Morphology: INCREASED
WBC: 12.8 10*3/uL — ABNORMAL HIGH (ref 4.0–10.5)
nRBC: 0 % (ref 0.0–0.2)

## 2020-09-08 LAB — SODIUM
Sodium: 156 mmol/L — ABNORMAL HIGH (ref 135–145)
Sodium: 156 mmol/L — ABNORMAL HIGH (ref 135–145)
Sodium: 154 mmol/L — ABNORMAL HIGH (ref 135–145)

## 2020-09-08 LAB — GLUCOSE, CAPILLARY
Glucose-Capillary: 272 mg/dL — ABNORMAL HIGH (ref 70–99)
Glucose-Capillary: 259 mg/dL — ABNORMAL HIGH (ref 70–99)
Glucose-Capillary: 237 mg/dL — ABNORMAL HIGH (ref 70–99)
Glucose-Capillary: 257 mg/dL — ABNORMAL HIGH (ref 70–99)
Glucose-Capillary: 211 mg/dL — ABNORMAL HIGH (ref 70–99)
Glucose-Capillary: 266 mg/dL — ABNORMAL HIGH (ref 70–99)

## 2020-09-08 LAB — TRIGLYCERIDES: Triglycerides: 114 mg/dL (ref <150)

## 2020-09-08 LAB — MAGNESIUM: Magnesium: 2.9 mg/dL — ABNORMAL HIGH (ref 1.7–2.4)

## 2020-09-08 MED ORDER — LEVETIRACETAM 100 MG/ML PO SOLN
500.0000 mg | Freq: Two times a day (BID) | ORAL | Status: DC
  Administered 2020-09-08 – 2020-10-02 (×48): 500 mg
  Filled 2020-09-08 (×47): qty 5

## 2020-09-08 MED ORDER — CHLORHEXIDINE GLUCONATE CLOTH 2 % EX PADS
6.0000 | MEDICATED_PAD | Freq: Every day | CUTANEOUS | Status: DC
  Administered 2020-09-08 – 2020-09-30 (×20): 6 via TOPICAL

## 2020-09-08 MED ORDER — VANCOMYCIN HCL 1250 MG/250ML IV SOLN
1250.0000 mg | INTRAVENOUS | Status: DC
  Administered 2020-09-08: 1250 mg via INTRAVENOUS
  Filled 2020-09-08: qty 250

## 2020-09-08 MED ORDER — PANTOPRAZOLE SODIUM 40 MG PO PACK
40.0000 mg | PACK | Freq: Every day | ORAL | Status: DC
  Administered 2020-09-08 – 2020-10-02 (×25): 40 mg
  Filled 2020-09-08 (×25): qty 20

## 2020-09-08 MED ORDER — INSULIN ASPART 100 UNIT/ML ~~LOC~~ SOLN
0.0000 [IU] | SUBCUTANEOUS | Status: DC
  Administered 2020-09-08 (×2): 7 [IU] via SUBCUTANEOUS
  Administered 2020-09-08 – 2020-09-09 (×3): 11 [IU] via SUBCUTANEOUS
  Administered 2020-09-09: 4 [IU] via SUBCUTANEOUS
  Administered 2020-09-09: 11 [IU] via SUBCUTANEOUS
  Administered 2020-09-09: 15 [IU] via SUBCUTANEOUS
  Administered 2020-09-09 – 2020-09-10 (×3): 7 [IU] via SUBCUTANEOUS
  Administered 2020-09-10 (×2): 11 [IU] via SUBCUTANEOUS
  Administered 2020-09-10 – 2020-09-11 (×4): 7 [IU] via SUBCUTANEOUS
  Administered 2020-09-11: 3 [IU] via SUBCUTANEOUS
  Administered 2020-09-11: 7 [IU] via SUBCUTANEOUS
  Administered 2020-09-11: 4 [IU] via SUBCUTANEOUS
  Administered 2020-09-11 – 2020-09-12 (×3): 7 [IU] via SUBCUTANEOUS
  Administered 2020-09-12: 4 [IU] via SUBCUTANEOUS
  Administered 2020-09-12 (×2): 7 [IU] via SUBCUTANEOUS
  Administered 2020-09-12: 11 [IU] via SUBCUTANEOUS
  Administered 2020-09-13 (×5): 4 [IU] via SUBCUTANEOUS
  Administered 2020-09-13: 7 [IU] via SUBCUTANEOUS
  Administered 2020-09-14: 4 [IU] via SUBCUTANEOUS
  Administered 2020-09-14: 7 [IU] via SUBCUTANEOUS
  Administered 2020-09-14: 4 [IU] via SUBCUTANEOUS
  Administered 2020-09-14 (×3): 7 [IU] via SUBCUTANEOUS
  Administered 2020-09-15 (×3): 3 [IU] via SUBCUTANEOUS
  Administered 2020-09-15: 11 [IU] via SUBCUTANEOUS
  Administered 2020-09-15: 3 [IU] via SUBCUTANEOUS
  Administered 2020-09-15 – 2020-09-16 (×3): 4 [IU] via SUBCUTANEOUS
  Administered 2020-09-16: 7 [IU] via SUBCUTANEOUS
  Administered 2020-09-16: 3 [IU] via SUBCUTANEOUS
  Administered 2020-09-16: 7 [IU] via SUBCUTANEOUS
  Administered 2020-09-17: 4 [IU] via SUBCUTANEOUS
  Administered 2020-09-17: 3 [IU] via SUBCUTANEOUS
  Administered 2020-09-17 (×2): 4 [IU] via SUBCUTANEOUS
  Administered 2020-09-17: 7 [IU] via SUBCUTANEOUS
  Administered 2020-09-18 (×2): 4 [IU] via SUBCUTANEOUS
  Administered 2020-09-18: 7 [IU] via SUBCUTANEOUS
  Administered 2020-09-18 – 2020-09-19 (×7): 4 [IU] via SUBCUTANEOUS
  Administered 2020-09-19 – 2020-09-20 (×4): 3 [IU] via SUBCUTANEOUS
  Administered 2020-09-20: 4 [IU] via SUBCUTANEOUS
  Administered 2020-09-21: 7 [IU] via SUBCUTANEOUS
  Administered 2020-09-21 (×3): 3 [IU] via SUBCUTANEOUS
  Administered 2020-09-22: 4 [IU] via SUBCUTANEOUS
  Administered 2020-09-22 (×3): 7 [IU] via SUBCUTANEOUS
  Administered 2020-09-22 – 2020-09-23 (×3): 4 [IU] via SUBCUTANEOUS
  Administered 2020-09-23 (×2): 7 [IU] via SUBCUTANEOUS
  Administered 2020-09-23 (×3): 4 [IU] via SUBCUTANEOUS
  Administered 2020-09-24: 7 [IU] via SUBCUTANEOUS
  Administered 2020-09-24: 3 [IU] via SUBCUTANEOUS
  Administered 2020-09-24: 4 [IU] via SUBCUTANEOUS
  Administered 2020-09-24: 3 [IU] via SUBCUTANEOUS
  Administered 2020-09-24 (×2): 4 [IU] via SUBCUTANEOUS
  Administered 2020-09-25: 7 [IU] via SUBCUTANEOUS
  Administered 2020-09-25: 4 [IU] via SUBCUTANEOUS
  Administered 2020-09-25: 3 [IU] via SUBCUTANEOUS
  Administered 2020-09-25 – 2020-09-26 (×4): 4 [IU] via SUBCUTANEOUS
  Administered 2020-09-26: 7 [IU] via SUBCUTANEOUS
  Administered 2020-09-26 (×2): 4 [IU] via SUBCUTANEOUS
  Administered 2020-09-26: 7 [IU] via SUBCUTANEOUS
  Administered 2020-09-26: 4 [IU] via SUBCUTANEOUS
  Administered 2020-09-27: 11 [IU] via SUBCUTANEOUS
  Administered 2020-09-27 (×2): 4 [IU] via SUBCUTANEOUS
  Administered 2020-09-27: 11 [IU] via SUBCUTANEOUS
  Administered 2020-09-28: 7 [IU] via SUBCUTANEOUS
  Administered 2020-09-28: 4 [IU] via SUBCUTANEOUS
  Administered 2020-09-28: 7 [IU] via SUBCUTANEOUS
  Administered 2020-09-28: 4 [IU] via SUBCUTANEOUS
  Administered 2020-09-28: 7 [IU] via SUBCUTANEOUS
  Administered 2020-09-29 (×2): 4 [IU] via SUBCUTANEOUS
  Administered 2020-09-29 (×2): 7 [IU] via SUBCUTANEOUS
  Administered 2020-09-29 (×2): 4 [IU] via SUBCUTANEOUS
  Administered 2020-09-30 (×3): 7 [IU] via SUBCUTANEOUS
  Administered 2020-09-30: 4 [IU] via SUBCUTANEOUS
  Administered 2020-10-01: 11 [IU] via SUBCUTANEOUS
  Administered 2020-10-01: 4 [IU] via SUBCUTANEOUS
  Administered 2020-10-01 (×2): 3 [IU] via SUBCUTANEOUS
  Administered 2020-10-01 – 2020-10-02 (×2): 4 [IU] via SUBCUTANEOUS
  Administered 2020-10-02 (×2): 7 [IU] via SUBCUTANEOUS
  Administered 2020-10-02: 4 [IU] via SUBCUTANEOUS

## 2020-09-08 MED FILL — Thrombin For Soln 5000 Unit: CUTANEOUS | Qty: 5000 | Status: AC

## 2020-09-08 NOTE — Progress Notes (Signed)
Pharmacy Antibiotic Note  [name] is a 75 y.o. male admitted on 09/01/2020 with brain mass; pt is S/P craniotomy for tumor resection on 7/34/19, complicated by intraparenchymal hemorrhage. Pt was intubated on 09/07/20  Pharmacy has been consulted for cefepime and vancomycin dosing for PNA.  SCr trended up to 1.64 this AM.  Trach aspirate culture growing abundant GPC in pairs in clusters.  Plan: Adjust vancomycin to 1250mg  IV q24h. Goal AUC 400-550. Expected AUC: 489 SCr used: 1.64 Cefepime 2g IV q12h Monitor WBC, temp, clinical improvement, cultures, renal function, vancomycin levels as indicated  Height: 6' (182.9 cm) Weight: 90.3 kg (199 lb 1.2 oz) IBW/kg (Calculated) : 77.6  Temp (24hrs), Avg:99.1 F (37.3 C), Min:97.6 F (36.4 C), Max:100.9 F (38.3 C)  Recent Labs  Lab 09/01/20 1326 09/06/20 1536 09/07/20 0935 09/07/20 1924 09/08/20 0400  WBC 6.0  --  18.4*  --  12.8*  CREATININE 1.31* 1.22  --  1.20 1.64*    Estimated Creatinine Clearance: 42.1 mL/min (A) (by C-G formula based on SCr of 1.64 mg/dL (H)).    Allergies  Allergen Reactions  . Lisinopril Cough  . Other Other (See Comments)  . Sulfa Drugs Cross Reactors     CHILDHOOD UNSPECIFIED REACTION   . Contrast Media [Iodinated Diagnostic Agents] Rash  . Doxycycline Itching  . Ioxaglate Rash  . Tetracyclines & Related Rash    Arturo Morton, PharmD, BCPS Please check AMION for all [facility] contact numbers Clinical Pharmacist 09/08/2020 9:34 AM

## 2020-09-08 NOTE — Progress Notes (Signed)
NAME:  [name], MRN:  [id number], DOB:  1945/08/22, LOS: 7 ADMISSION DATE:  09/01/2020, CONSULTATION DATE:  4/26 REFERRING MD:  Dr. Christella Noa, CHIEF COMPLAINT:  Airway protection issuess   History of Present Illness:  75 year old male with PMH as below, which is significant for three vessel CAD, prostate CA, OSA on CPAP, AF on apixaban, and HTN presented initially with complaints of memory loss and word finding issues. MRI 4/1 demonstrated mass lesion in the right superior temporal gyrus with surrounding vasogenic edema and mass effect. The patiet was referred to neurosurgery and ultimately scheduled for surgery. He was admitted to [facility] 4/20 and taken to the OR under Dr. Christella Noa for craniotomy for tumor resection. The procedure was complicated by intraparenchymal hemorrhage. The patient was brought to the ICU for recovery and required hypertonic saline for the management of cerebral edema. He continued to have progressive decline of his neurologic status and ultimatly required intubation in the setting of poor airway protection on 4/26. PCCM consulted for vent management and ICU care.  4/26, had return to OR for craniotomy and evacuation of SDH.  Pertinent  Medical History   has a past medical history of Anticoagulant long-term use, Cancer ([place]), Coronary artery disease (cardiologist--- Truitt Merle PA), Depression, DOE (dyspnea on exertion), Feeling of incomplete bladder emptying, Full dentures, Heart murmur, History of kidney stones (2022), History of prostate cancer (1998), History of urethral stricture, Hypertension, Incisional hernia, Macular degeneration of both eyes, Nephrolithiasis, OSA on CPAP, Osteoarthritis (arthritis due to wear and tear of joints), Persistent atrial fibrillation ([place]), Slow urinary stream, Type 2 diabetes mellitus ([place]), 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   Significant Hospital Events: Including procedures, antibiotic start  and stop dates in addition to other pertinent events   4/20 admit, to OR for resection of GBM 4/21 IPH 4/26 intubated 4/27 return to OR for craniotomy and evacuation of SDH.  Interim History / Subjective:  Sedated, not following commands.  Objective   Blood pressure 110/75, pulse 83, temperature 99 F (37.2 C), temperature source Axillary, resp. rate 20, height 6' (1.829 m), weight 90.3 kg, SpO2 96 %.    Vent Mode: PRVC FiO2 (%):  [40 %-100 %] 50 % Set Rate:  [18 bmp] 18 bmp Vt Set:  [620 mL] 620 mL PEEP:  [5 cmH20-8 cmH20] 8 cmH20 Plateau Pressure:  [14 cmH20-20 cmH20] 14 cmH20   Intake/Output Summary (Last 24 hours) at 09/08/2020 0758 Last data filed at 09/08/2020 0700 Gross per 24 hour  Intake 5430.23 ml  Output 2255 ml  Net 3175.23 ml   Filed Weights   09/06/20 1000 09/07/20 0500 09/07/20 1000  Weight: 91 kg 103.4 kg 90.3 kg    Examination: General: Adult male, resting in bed, in NAD. Neuro: Sedated, not following commands. HEENT: Scalp dressings C/D/I. Sclerae anicteric. ETT in place. Cardiovascular: RRR, no M/R/G.  Lungs: Respirations even and unlabored.  CTA bilaterally, No W/R/R. Abdomen: BS x 4, soft, NT/ND.  Musculoskeletal: No gross deformities, no edema.  Skin: Intact, warm, no rashes.  Labs/imaging that I have personally reviewed  (right click and "Reselect all SmartList Selections" daily)  WBC 18.4 Na 161 iatrogenic plt 133 SCr 1.22 4/22 Ct head increased size of IPH with leftward shift and effacement of third ventricle. Similar small volume of overlying SAH and IVH to prior study.   Resolved Hospital Problem list     Assessment & Plan:   Grade 4 glioblastoma.  ICH with IVH extension and SAH. SDH - s/p craniotomy and evacuation 4/26. Acute encephalopathy: secondary to above. - Management per neurosurgery. - Hypertonic on hold for now. - Continue Dexamethasone, Keppra.  Acute hypoxemic respiratory failure secondary to aspiration and poor airway  protection. - Full vent support. - Continue Cefepime and vancomycin for empiric HCAP coverage for 7 days (stop date 5/2). - Not a candidate for SBT at this point.  Atrial fibrillation with RVR - now rate controlled. Hx HTN, CAD. - hold home apixaban and all AC given ICH/SAH. - holding home amlodipine, asa, lasix, metoprolol, losartan. - Continue amiodarone, hopefully short duration.   Hypernatremia: iatrogenic from hypertonic saline.  - Continue free water per tube. - Hold hypertonic saline.  AKI. - Continue supportive care.  Nutrition. - Continue TF.  DM. - CBG monitoring and SSI, change to resistant.  OSA on CPAP. - Continue CPAP nocturnally once extubated.    Best practice (right click and "Reselect all SmartList Selections" daily)  Diet:  NPO Pain/Anxiety/Delirium protocol (if indicated): Yes (RASS goal -1,-2) VAP protocol (if indicated): Yes DVT prophylaxis: Contraindicated GI prophylaxis: PPI Glucose control:  SSI Yes Central venous access:  N/A Arterial line:  N/A Foley:  N/A Mobility:  bed rest  PT consulted: N/A Last date of multidisciplinary goals of care discussion [ per primary] Code Status:  full code Disposition: ICU   Critical care time: 30 minutes    Montey Hora, [place] [facility] For pager details, please see AMION or use Epic chat  After 1900, please call [place] for cross coverage needs 09/08/2020, 8:17 AM

## 2020-09-08 NOTE — Progress Notes (Signed)
Inpatient Diabetes Program Recommendations  AACE/ADA: New Consensus Statement on Inpatient Glycemic Control   Target Ranges:  Prepandial:   less than 140 mg/dL      Peak postprandial:   less than 180 mg/dL (1-2 hours)      Critically ill patients:  140 - 180 mg/dL   Review of Glycemic Control Results for [name], [place] (MRN [id number]) as of 09/08/2020 09:59  Ref. Range 09/07/2020 07:42 09/07/2020 11:27 09/07/2020 15:25 09/07/2020 20:30 09/07/2020 23:17 09/08/2020 03:25 09/08/2020 06:57  Glucose-Capillary Latest Ref Range: 70 - 99 mg/dL 200 (H) 164 (H) 195 (H) 116 (H) 154 (H) 272 (H) 259 (H)   Diabetes history: DM2 Outpatient Diabetes medications: Metformin 500 mg QAM Current orders for Inpatient glycemic control: Novolog 0-20 units Q4H; Decadron 4 mg BID, Pivot @ 65 ml/hr  Inpatient Diabetes Program Recommendations:    Insulin-Tube Feeding: Please consider ordering Novolog 4 units Q4H for tube feeding coverage. If tube feeding is stopped or held then Novolog tube feeding coverage should also be stopped or held.  Thanks, Tama Headings RN, MSN, BC-ADM Inpatient Diabetes Coordinator Team Pager [phone] (8a-5p)

## 2020-09-08 NOTE — Progress Notes (Signed)
Patient ID: [name], male   DOB: 1945-08-28, 75 y.o.   MRN: [id number] BP 123/80   Pulse (!) 109   Temp 99.9 F (37.7 C) (Axillary)   Resp (!) 27   Ht 6' (1.829 m)   Wt 90.3 kg   SpO2 97%   BMI 27.00 kg/m  Intubated, minimal sedation Not following commands, purposeful in right upper extremities Head ct much improved, less shift Will continue weaning, possible extubation tomorrow

## 2020-09-09 DIAGNOSIS — R5383 Other fatigue: Secondary | ICD-10-CM

## 2020-09-09 LAB — GLUCOSE, CAPILLARY
Glucose-Capillary: 303 mg/dL — ABNORMAL HIGH (ref 70–99)
Glucose-Capillary: 261 mg/dL — ABNORMAL HIGH (ref 70–99)
Glucose-Capillary: 197 mg/dL — ABNORMAL HIGH (ref 70–99)
Glucose-Capillary: 226 mg/dL — ABNORMAL HIGH (ref 70–99)
Glucose-Capillary: 281 mg/dL — ABNORMAL HIGH (ref 70–99)

## 2020-09-09 LAB — CBC WITH DIFFERENTIAL/PLATELET
Abs Immature Granulocytes: 0.22 10*3/uL — ABNORMAL HIGH (ref 0.00–0.07)
Basophils Absolute: 0 10*3/uL (ref 0.0–0.1)
Basophils Relative: 0 %
Eosinophils Absolute: 0 10*3/uL (ref 0.0–0.5)
Eosinophils Relative: 0 %
HCT: 33.9 % — ABNORMAL LOW (ref 39.0–52.0)
Hemoglobin: 10.8 g/dL — ABNORMAL LOW (ref 13.0–17.0)
Immature Granulocytes: 3 %
Lymphocytes Relative: 6 %
Lymphs Abs: 0.5 10*3/uL — ABNORMAL LOW (ref 0.7–4.0)
MCH: 30.3 pg (ref 26.0–34.0)
MCHC: 31.9 g/dL (ref 30.0–36.0)
MCV: 95.2 fL (ref 80.0–100.0)
Monocytes Absolute: 0.5 10*3/uL (ref 0.1–1.0)
Monocytes Relative: 5 %
Neutro Abs: 7.6 10*3/uL (ref 1.7–7.7)
Neutrophils Relative %: 86 %
Platelets: 102 10*3/uL — ABNORMAL LOW (ref 150–400)
RBC: 3.56 MIL/uL — ABNORMAL LOW (ref 4.22–5.81)
RDW: 15.1 % (ref 11.5–15.5)
WBC: 8.8 10*3/uL (ref 4.0–10.5)
nRBC: 0 % (ref 0.0–0.2)

## 2020-09-09 LAB — CULTURE, RESPIRATORY W GRAM STAIN

## 2020-09-09 LAB — SODIUM
Sodium: 156 mmol/L — ABNORMAL HIGH (ref 135–145)
Sodium: 151 mmol/L — ABNORMAL HIGH (ref 135–145)

## 2020-09-09 LAB — BASIC METABOLIC PANEL
Anion gap: 6 (ref 5–15)
BUN: 64 mg/dL — ABNORMAL HIGH (ref 8–23)
CO2: 21 mmol/L — ABNORMAL LOW (ref 22–32)
Calcium: 8.2 mg/dL — ABNORMAL LOW (ref 8.9–10.3)
Chloride: 125 mmol/L — ABNORMAL HIGH (ref 98–111)
Creatinine, Ser: 1.57 mg/dL — ABNORMAL HIGH (ref 0.61–1.24)
GFR, Estimated: 45 mL/min — ABNORMAL LOW (ref >60)
Glucose, Bld: 342 mg/dL — ABNORMAL HIGH (ref 70–99)
Potassium: 4.8 mmol/L (ref 3.5–5.1)
Sodium: 152 mmol/L — ABNORMAL HIGH (ref 135–145)

## 2020-09-09 MED ORDER — SENNOSIDES-DOCUSATE SODIUM 8.6-50 MG PO TABS
1.0000 | ORAL_TABLET | Freq: Two times a day (BID) | ORAL | Status: DC | PRN
  Administered 2020-09-09: 1

## 2020-09-09 MED ORDER — METOPROLOL TARTRATE 25 MG PO TABS
25.0000 mg | ORAL_TABLET | Freq: Two times a day (BID) | ORAL | Status: DC
  Administered 2020-09-09 – 2020-10-02 (×47): 25 mg
  Filled 2020-09-09 (×47): qty 1

## 2020-09-09 MED ORDER — FENTANYL 2500MCG IN NS 250ML (10MCG/ML) PREMIX INFUSION
0.0000 ug/h | INTRAVENOUS | Status: DC
  Administered 2020-09-09: 50 ug/h via INTRAVENOUS
  Filled 2020-09-09: qty 250

## 2020-09-09 MED ORDER — CEFAZOLIN SODIUM-DEXTROSE 2-4 GM/100ML-% IV SOLN
2.0000 g | Freq: Three times a day (TID) | INTRAVENOUS | Status: AC
  Administered 2020-09-09 – 2020-09-14 (×15): 2 g via INTRAVENOUS
  Filled 2020-09-09 (×15): qty 100

## 2020-09-09 MED ORDER — INSULIN ASPART 100 UNIT/ML ~~LOC~~ SOLN
5.0000 [IU] | SUBCUTANEOUS | Status: DC
  Administered 2020-09-09 – 2020-09-10 (×8): 5 [IU] via SUBCUTANEOUS

## 2020-09-09 MED ORDER — ADULT MULTIVITAMIN W/MINERALS CH
1.0000 | ORAL_TABLET | Freq: Every day | ORAL | Status: DC
  Administered 2020-09-09 – 2020-10-02 (×24): 1
  Filled 2020-09-09 (×24): qty 1

## 2020-09-09 MED ORDER — FREE WATER
200.0000 mL | Freq: Three times a day (TID) | Status: DC
  Administered 2020-09-09 – 2020-10-02 (×70): 200 mL

## 2020-09-09 NOTE — Progress Notes (Signed)
NAME:  [name], MRN:  [id number], DOB:  1945-05-31, LOS: 8 ADMISSION DATE:  09/01/2020, CONSULTATION DATE:  4/26 REFERRING MD:  Dr. Christella Noa, CHIEF COMPLAINT:  Airway protection issuess   History of Present Illness:  75 year old male with PMH as below, which is significant for three vessel CAD, prostate CA, OSA on CPAP, AF on apixaban, and HTN presented initially with complaints of memory loss and word finding issues. MRI 4/1 demonstrated mass lesion in the right superior temporal gyrus with surrounding vasogenic edema and mass effect. The patiet was referred to neurosurgery and ultimately scheduled for surgery. He was admitted to [facility] 4/20 and taken to the OR under Dr. Christella Noa for craniotomy for tumor resection. The procedure was complicated by intraparenchymal hemorrhage. The patient was brought to the ICU for recovery and required hypertonic saline for the management of cerebral edema. He continued to have progressive decline of his neurologic status and ultimatly required intubation in the setting of poor airway protection on 4/26. PCCM consulted for vent management and ICU care.  4/26, had return to OR for craniotomy and evacuation of SDH.  Pertinent  Medical History   has a past medical history of Anticoagulant long-term use, Cancer ([place]), Coronary artery disease (cardiologist--- Truitt Merle PA), Depression, DOE (dyspnea on exertion), Feeling of incomplete bladder emptying, Full dentures, Heart murmur, History of kidney stones (2022), History of prostate cancer (1998), History of urethral stricture, Hypertension, Incisional hernia, Macular degeneration of both eyes, Nephrolithiasis, OSA on CPAP, Osteoarthritis (arthritis due to wear and tear of joints), Persistent atrial fibrillation ([place]), Slow urinary stream, Type 2 diabetes mellitus ([place]), 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   Significant Hospital Events: Including procedures, antibiotic start  and stop dates in addition to other pertinent events    4/20 admit, to OR for resection of GBM  4/21 IPH  4/26 intubated  4/27 return to OR for craniotomy and evacuation of SDH.  4/28 sedation stopped.    Interim History / Subjective:  Sedation stopped. Not following commands, but responds to pain. Minimal secretions overnight.   Objective   Blood pressure 107/75, pulse 98, temperature 98.9 F (37.2 C), temperature source Axillary, resp. rate 13, height 6' (1.829 m), weight 90.3 kg, SpO2 96 %.    Vent Mode: CPAP;PSV FiO2 (%):  [50 %-100 %] 50 % Set Rate:  [18 bmp] 18 bmp Vt Set:  [620 mL] 620 mL PEEP:  [8 cmH20] 8 cmH20 Pressure Support:  [5 cmH20] 5 cmH20 Plateau Pressure:  [16 cmH20-22 cmH20] 22 cmH20   Intake/Output Summary (Last 24 hours) at 09/09/2020 0750 Last data filed at 09/09/2020 0700 Gross per 24 hour  Intake 3728.04 ml  Output 2535 ml  Net 1193.04 ml   Filed Weights   09/06/20 1000 09/07/20 0500 09/07/20 1000  Weight: 91 kg 103.4 kg 90.3 kg    Examination: General: Adult male, resting in bed, in NAD. Neuro: Sedated, not following commands, left hemiplegia. Will open eyes to painful stimulation,  HEENT: Scalp dressings C/D/I. Sclerae anicteric. ETT in place. Cardiovascular: RRR, no M/R/G.  Lungs: Respirations even and unlabored.  CTA bilaterally, No W/R/R. SBI 15. No secretions on suctioning.  Abdomen: BS x 4, soft, NT/ND. No bowel movement GU: condom catheter Musculoskeletal: No gross deformities, no edema.  Skin: Intact, warm, no rashes.  Labs/imaging that I have personally reviewed  (right click and "Reselect all SmartList Selections" daily)   Tracheal aspirate + S.aureus, sensitivities pendingn  Resolved Hospital Problem list     Assessment & Plan:   Grade 4 glioblastoma. ICH with IVH extension and SAH. SDH - s/p craniotomy and evacuation 4/26. Acute encephalopathy: secondary to above. - Hypertonic saline stopped and sodium correcting.   - Continue Dexamethasone, Keppra.  Acute hypoxemic respiratory failure secondary to aspiration and poor airway protection. - Tolerating SBT - Fentanyl stopped - mental status will drive extubation.,.  Atrial fibrillation with RVR - now rate controlled. Hx HTN, CAD. - hold home apixaban and all AC given ICH/SAH. - holding home amlodipine, asa, lasix, metoprolol, losartan. - Transition from amiodarone to enteral metoprolol  Hypernatremia: iatrogenic from hypertonic saline.  - Continue free water per tube.  AKI. - Creatinine improving - Continue supportive care.  OSA on CPAP. - Continue CPAP nocturnally once extubated.    Best practice (right click and "Reselect all SmartList Selections" daily)  Diet:  Tube Feed  - started bowel regimen Pain/Anxiety/Delirium protocol (if indicated): No - fentanyl prn only.  VAP protocol (if indicated): Yes DVT prophylaxis: SCD - should be able to restart [place] heparin soon.  GI prophylaxis: PPI Glucose control:  SSI Yes - tube feed coverage added.  Central venous access:  N/A Arterial line:  N/A Foley:  N/A Mobility:  bed rest  PT consulted: N/A Last date of multidisciplinary goals of care discussion [ per primary] Code Status:  full code Disposition: ICU  CRITICAL CARE Performed by: Kipp Brood   Total critical care time: 40 minutes  Critical care time was exclusive of separately billable procedures and treating other patients.  Critical care was necessary to treat or prevent imminent or life-threatening deterioration.  Critical care was time spent personally by me on the following activities: development of treatment plan with patient and/or surrogate as well as nursing, discussions with consultants, evaluation of patient's response to treatment, examination of patient, obtaining history from patient or surrogate, ordering and performing treatments and interventions, ordering and review of laboratory studies, ordering and review of  radiographic studies, pulse oximetry, re-evaluation of patient's condition and participation in multidisciplinary rounds.  Kipp Brood, MD [facility] ICU Physician [place]  Pager: [phone] Mobile: [phone] After hours: [phone].

## 2020-09-09 NOTE — Progress Notes (Signed)
Nutrition Follow-up  DOCUMENTATION CODES:   Not applicable  INTERVENTION:   Tube feeding via Cortrak: Pivot 1.5 at 65 ml/h (1560 ml per day)  Provides 2340 kcal, 146 gm protein, 1184 ml free water daily  200 ml free water every 6 hours Total free water: 1984 ml   NUTRITION DIAGNOSIS:   Increased nutrient needs related to post-op healing as evidenced by estimated needs. Ongoing.   GOAL:   Patient will meet greater than or equal to 90% of their needs Met with TF.   MONITOR:   TF tolerance  REASON FOR ASSESSMENT:   Consult Enteral/tube feeding initiation and management  ASSESSMENT:   Pt with PMH of HTN, depression, OA, OSA on CPAP, DM, CAD s/p CABG x 3 05/2019, incisional hernia admitted for R temporal lobe lesion plan for resection of mass.   Pt discussed during ICU rounds and with RN.  Per MD and RN sedation now off, mental status barrier to extubation  Per conversation during rounds noted blood sugars are elevated, noted pt now on IV steroids Wife not in room during visit.  4/20 s/p R craniotomy for tumor  4/21 pt with new L sided weakness and lethargy and R gaze preference, stat CT shows new large hemorrhage deep in resection cavity with midline shift 4/22 Cortrak placed; tip gastric 4/26 s/p craniotomy for hematoma evacuation; returned intubated  Patient is currently intubated on ventilator support  Medications reviewed and include: decadron, colace, SSI, novolog, metformin, MVI with minerals, protonix, miralax   Labs reviewed: Na 156 CBG's: 197-303      Diet Order:   Diet Order    None      EDUCATION NEEDS:   No education needs have been identified at this time  Skin:  Skin Assessment: Reviewed RN Assessment  Last BM:  4/23  Height:   Ht Readings from Last 1 Encounters:  09/07/20 6' (1.829 m)    Weight:   Wt Readings from Last 1 Encounters:  09/07/20 90.3 kg    Ideal Body Weight:  80.9 kg  BMI:  Body mass index is 27  kg/m.  Estimated Nutritional Needs:   Kcal:  2200-2400  Protein:  120-140 grams  Fluid:  >2 L/day  Lockie Pares., RD, LDN, CNSC See AMiON for contact information

## 2020-09-09 NOTE — Progress Notes (Signed)
Patient ID: [name], male   DOB: Dec 27, 1945, 75 y.o.   MRN: [id number] BP 109/72   Pulse 95   Temp 99.8 F (37.7 C)   Resp 16   Ht 6' (1.829 m)   Wt 90.3 kg   SpO2 98%   BMI 27.00 kg/m  Not following commands Purposeful with right upper extremity Dressing dry and intact Weaning, sedation at a minimum No neurological change since surgery. Continue supportive care Results for orders placed or performed during the hospital encounter of 09/01/20 (from the past 24 hour(s))  Glucose, capillary     Status: Abnormal   Collection Time: 09/08/20  3:26 PM  Result Value Ref Range   Glucose-Capillary 257 (H) 70 - 99 mg/dL  Sodium     Status: Abnormal   Collection Time: 09/08/20  4:01 PM  Result Value Ref Range   Sodium 156 (H) 135 - 145 mmol/L  Glucose, capillary     Status: Abnormal   Collection Time: 09/08/20  7:17 PM  Result Value Ref Range   Glucose-Capillary 211 (H) 70 - 99 mg/dL  Sodium     Status: Abnormal   Collection Time: 09/08/20  9:55 PM  Result Value Ref Range   Sodium 154 (H) 135 - 145 mmol/L  Glucose, capillary     Status: Abnormal   Collection Time: 09/08/20 11:18 PM  Result Value Ref Range   Glucose-Capillary 266 (H) 70 - 99 mg/dL  Glucose, capillary     Status: Abnormal   Collection Time: 09/09/20  3:10 AM  Result Value Ref Range   Glucose-Capillary 303 (H) 70 - 99 mg/dL  CBC with Differential/Platelet     Status: Abnormal   Collection Time: 09/09/20  3:16 AM  Result Value Ref Range   WBC 8.8 4.0 - 10.5 K/uL   RBC 3.56 (L) 4.22 - 5.81 MIL/uL   Hemoglobin 10.8 (L) 13.0 - 17.0 g/dL   HCT 33.9 (L) 39.0 - 52.0 %   MCV 95.2 80.0 - 100.0 fL   MCH 30.3 26.0 - 34.0 pg   MCHC 31.9 30.0 - 36.0 g/dL   RDW 15.1 11.5 - 15.5 %   Platelets 102 (L) 150 - 400 K/uL   nRBC 0.0 0.0 - 0.2 %   Neutrophils Relative % 86 %   Neutro Abs 7.6 1.7 - 7.7 K/uL   Lymphocytes Relative 6 %   Lymphs Abs 0.5 (L) 0.7 - 4.0 K/uL   Monocytes Relative 5 %   Monocytes Absolute 0.5 0.1 -  1.0 K/uL   Eosinophils Relative 0 %   Eosinophils Absolute 0.0 0.0 - 0.5 K/uL   Basophils Relative 0 %   Basophils Absolute 0.0 0.0 - 0.1 K/uL   Immature Granulocytes 3 %   Abs Immature Granulocytes 0.22 (H) 0.00 - 0.07 K/uL  Basic metabolic panel     Status: Abnormal   Collection Time: 09/09/20  3:16 AM  Result Value Ref Range   Sodium 152 (H) 135 - 145 mmol/L   Potassium 4.8 3.5 - 5.1 mmol/L   Chloride 125 (H) 98 - 111 mmol/L   CO2 21 (L) 22 - 32 mmol/L   Glucose, Bld 342 (H) 70 - 99 mg/dL   BUN 64 (H) 8 - 23 mg/dL   Creatinine, Ser 1.57 (H) 0.61 - 1.24 mg/dL   Calcium 8.2 (L) 8.9 - 10.3 mg/dL   GFR, Estimated 45 (L) >60 mL/min   Anion gap 6 5 - 15  Glucose, capillary  Status: Abnormal   Collection Time: 09/09/20  7:58 AM  Result Value Ref Range   Glucose-Capillary 261 (H) 70 - 99 mg/dL  Sodium     Status: Abnormal   Collection Time: 09/09/20 11:40 AM  Result Value Ref Range   Sodium 156 (H) 135 - 145 mmol/L  Glucose, capillary     Status: Abnormal   Collection Time: 09/09/20 11:48 AM  Result Value Ref Range   Glucose-Capillary 197 (H) 70 - 99 mg/dL

## 2020-09-10 ENCOUNTER — Inpatient Hospital Stay: Admission: RE | Admit: 2020-09-10 | Payer: Medicare Other | Source: Ambulatory Visit

## 2020-09-10 ENCOUNTER — Ambulatory Visit: Payer: Medicare Other | Admitting: Thoracic Surgery (Cardiothoracic Vascular Surgery)

## 2020-09-10 DIAGNOSIS — R0902 Hypoxemia: Secondary | ICD-10-CM | POA: Diagnosis not present

## 2020-09-10 DIAGNOSIS — D496 Neoplasm of unspecified behavior of brain: Secondary | ICD-10-CM

## 2020-09-10 LAB — CBC WITH DIFFERENTIAL/PLATELET
Abs Immature Granulocytes: 0.37 10*3/uL — ABNORMAL HIGH (ref 0.00–0.07)
Basophils Absolute: 0 10*3/uL (ref 0.0–0.1)
Basophils Relative: 0 %
Eosinophils Absolute: 0 10*3/uL (ref 0.0–0.5)
Eosinophils Relative: 0 %
HCT: 34.7 % — ABNORMAL LOW (ref 39.0–52.0)
Hemoglobin: 11.2 g/dL — ABNORMAL LOW (ref 13.0–17.0)
Immature Granulocytes: 3 %
Lymphocytes Relative: 6 %
Lymphs Abs: 0.6 10*3/uL — ABNORMAL LOW (ref 0.7–4.0)
MCH: 30.4 pg (ref 26.0–34.0)
MCHC: 32.3 g/dL (ref 30.0–36.0)
MCV: 94.3 fL (ref 80.0–100.0)
Monocytes Absolute: 0.6 10*3/uL (ref 0.1–1.0)
Monocytes Relative: 5 %
Neutro Abs: 9.8 10*3/uL — ABNORMAL HIGH (ref 1.7–7.7)
Neutrophils Relative %: 86 %
Platelets: 96 10*3/uL — ABNORMAL LOW (ref 150–400)
RBC: 3.68 MIL/uL — ABNORMAL LOW (ref 4.22–5.81)
RDW: 15.4 % (ref 11.5–15.5)
WBC: 11.4 10*3/uL — ABNORMAL HIGH (ref 4.0–10.5)
nRBC: 0 % (ref 0.0–0.2)

## 2020-09-10 LAB — BASIC METABOLIC PANEL
Anion gap: 5 (ref 5–15)
BUN: 68 mg/dL — ABNORMAL HIGH (ref 8–23)
CO2: 24 mmol/L (ref 22–32)
Calcium: 8.7 mg/dL — ABNORMAL LOW (ref 8.9–10.3)
Chloride: 122 mmol/L — ABNORMAL HIGH (ref 98–111)
Creatinine, Ser: 1.6 mg/dL — ABNORMAL HIGH (ref 0.61–1.24)
GFR, Estimated: 44 mL/min — ABNORMAL LOW (ref >60)
Glucose, Bld: 257 mg/dL — ABNORMAL HIGH (ref 70–99)
Potassium: 4.8 mmol/L (ref 3.5–5.1)
Sodium: 151 mmol/L — ABNORMAL HIGH (ref 135–145)

## 2020-09-10 LAB — GLUCOSE, CAPILLARY
Glucose-Capillary: 206 mg/dL — ABNORMAL HIGH (ref 70–99)
Glucose-Capillary: 217 mg/dL — ABNORMAL HIGH (ref 70–99)
Glucose-Capillary: 221 mg/dL — ABNORMAL HIGH (ref 70–99)
Glucose-Capillary: 229 mg/dL — ABNORMAL HIGH (ref 70–99)
Glucose-Capillary: 246 mg/dL — ABNORMAL HIGH (ref 70–99)
Glucose-Capillary: 251 mg/dL — ABNORMAL HIGH (ref 70–99)
Glucose-Capillary: 285 mg/dL — ABNORMAL HIGH (ref 70–99)

## 2020-09-10 LAB — SODIUM
Sodium: 152 mmol/L — ABNORMAL HIGH (ref 135–145)
Sodium: 148 mmol/L — ABNORMAL HIGH (ref 135–145)

## 2020-09-10 MED ORDER — SENNOSIDES-DOCUSATE SODIUM 8.6-50 MG PO TABS
1.0000 | ORAL_TABLET | Freq: Two times a day (BID) | ORAL | Status: DC
  Administered 2020-09-10 – 2020-10-02 (×25): 1
  Filled 2020-09-10 (×29): qty 1

## 2020-09-10 MED ORDER — INSULIN ASPART 100 UNIT/ML ~~LOC~~ SOLN
9.0000 [IU] | SUBCUTANEOUS | Status: DC
  Administered 2020-09-10 – 2020-09-12 (×11): 9 [IU] via SUBCUTANEOUS

## 2020-09-10 MED ORDER — FENTANYL CITRATE (PF) 100 MCG/2ML IJ SOLN
25.0000 ug | INTRAMUSCULAR | Status: DC | PRN
  Administered 2020-09-10 – 2020-09-11 (×3): 100 ug via INTRAVENOUS
  Filled 2020-09-10 (×4): qty 2

## 2020-09-10 NOTE — Progress Notes (Signed)
   Providing Compassionate, Quality Care - Together  NEUROSURGERY PROGRESS NOTE   S: No issues overnight. More alert this am, wife at bedside  O: EXAM:  BP 129/81   Pulse 90   Temp (!) 100.6 F (38.1 C) (Axillary)   Resp (!) 21   Ht 6' (1.829 m)   Wt 96.2 kg   SpO2 98%   BMI 28.76 kg/m   Intubated Eyes open spnt PERRL L hemiplegia R side FC, spont movement Dressing intact  ASSESSMENT:  75 y.o. male with   1. GBM, s/p resection 2. Postop hemorrhage, s/p evac  PLAN: - wean vent per CCM -updated wife -supportive care -sbp control -pain control    Thank you for allowing me to participate in this patient's care.  Please do not hesitate to call with questions or concerns.   Elwin Sleight, [facility] Cell: [phone]

## 2020-09-10 NOTE — Progress Notes (Signed)
Pt placed back on full vent support due to increased WOB. RN aware. Rt to continue to monitor.

## 2020-09-10 NOTE — Progress Notes (Signed)
Pt placed on PSV 8/8 per wean protocol. Pt is tolerating well at this time. RN aware. RT to continue to monitor.

## 2020-09-10 NOTE — Progress Notes (Signed)
Inpatient Diabetes Program Recommendations  AACE/ADA: New Consensus Statement on Inpatient Glycemic Control (2015)  Target Ranges:  Prepandial:   less than 140 mg/dL      Peak postprandial:   less than 180 mg/dL (1-2 hours)      Critically ill patients:  140 - 180 mg/dL   Lab Results  Component Value Date   GLUCAP 246 (H) 09/10/2020   HGBA1C 5.8 (H) 09/05/2020    Review of Glycemic Control Results for JAVAUGHN, OPDAHL (MRN [id number]) as of 09/10/2020 09:42  Ref. Range 09/09/2020 07:58 09/09/2020 11:48 09/09/2020 16:15 09/09/2020 19:21 09/10/2020 00:20 09/10/2020 04:12 09/10/2020 07:37  Glucose-Capillary Latest Ref Range: 70 - 99 mg/dL 261 (H) 197 (H) 226 (H) 281 (H) 251 (H) 285 (H) 246 (H)   Inpatient Diabetes Program Recommendations:    While on steroids: -Increase Novolog to 8 units q 4 hrs.  Thank you, Nani Gasser. Jeris Roser, RN, MSN, CDE  Diabetes Coordinator Inpatient Glycemic Control Team Team Pager [phone] (8am-5pm) 09/10/2020 9:44 AM

## 2020-09-10 NOTE — Progress Notes (Signed)
NAME:  [name], MRN:  [id number], DOB:  07-02-1945, LOS: 9 ADMISSION DATE:  09/01/2020, CONSULTATION DATE:  4/26 REFERRING MD:  Christella Noa, CHIEF COMPLAINT:  Inability to protect airway   History of Present Illness:  75 y/o male presented to [facility] with memory loss and word finding problems, noted to have a mass lesion in his right superior temporal gyrus which was resected on 4/20 by Dr. Christella Noa.  He had post operative intraparenchymal hemorrhage.    Pertinent  Medical History  CAD Depression Kidney stones Prostate cancer Hypertension Macular degeneration OSA on CPAP Persistent atrial fibrillation DM2 Hard of hearing, has hearing aids  Significant Hospital Events: Including procedures, antibiotic start and stop dates in addition to other pertinent events    4/20 admit, to OR for resection of GBM  4/21 IPH  4/26 intubated  4/27 return to OR for craniotomy and evacuation of SDH.  4/27 CT head > most of the large right sided hematoma resected, improvement in mass effect, 73mm midline shift, post op pneumocephalus, some blood in ventriculs, extensive white matter hypodensity consistent with chronic microvascular ischemia  4/28 sedation stopped.   Interim History / Subjective:   Weaning on vent Follows some commands Nursing reports intermittent purposeful movements   Objective   Blood pressure 136/87, pulse 85, temperature 98.6 F (37 C), temperature source Axillary, resp. rate 11, height 6' (1.829 m), weight 96.2 kg, SpO2 98 %.    Vent Mode: PSV;CPAP FiO2 (%):  [40 %-50 %] 40 % Set Rate:  [18 bmp] 18 bmp Vt Set:  [620 mL] 620 mL PEEP:  [8 cmH20] 8 cmH20 Pressure Support:  [8 cmH20] 8 cmH20 Plateau Pressure:  [15 cmH20-16 cmH20] 16 cmH20   Intake/Output Summary (Last 24 hours) at 09/10/2020 0836 Last data filed at 09/10/2020 0700 Gross per 24 hour  Intake 3073.5 ml  Output 1600 ml  Net 1473.5 ml   Filed Weights   09/07/20 0500 09/07/20 1000 09/10/20 0500  Weight:  103.4 kg 90.3 kg 96.2 kg    Examination:  General:  In bed on vent HENT: scalp dressing in place ETT in place PULM: CTA B, vent supported breathing CV: RRR, no mgr GI: BS+, soft, nontender MSK: normal bulk and tone Neuro: drowsy, opens eyes to voice, no movement on left (arm/leg), however will raise his right hand on command for me    Labs/imaging that I havepersonally reviewed  (right click and "Reselect all SmartList Selections" daily)  WBC up some Hgb stable Na down some compared to yesterday but still elevated  4/26 CXR reviewed> normal lung fields, ETT in place  Resolved Hospital Problem list     Assessment & Plan:  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> evacuation on 4/26, mass effect improved Acute encephalopathy due to the above causes Continue decadron and keppra Wound/dressing management per NSGY  Acute hypoxemic respiratory failure due to aspiration and poor airway protection 4/29 passing SBT Mental status precludes extubation, but on fentanyl infusion this morning Stop fentanyl drip Re-assess mental status today If more awake, attempt extubation If needs re-intubation then will need to discuss tracheostomy with primary service and family  Atrial fibrillation CAD, hypertension Holding anticoagulation given above Hold home lasix Holding losartan Continue metoprolol 25mg  bid  OSA on CPAP at home CPAP post extubation  AKI> stable Monitor BMET and UOP Replace electrolytes as needed  Hypernatremia: iatrogenic as was on hypertonic saline Continue low dose free water for now, avoiding rapid correction  Best practice (right click and "Reselect all SmartList Selections" daily)  Diet:  Tube Feed  Pain/Anxiety/Delirium protocol (if indicated): Yes (RASS goal -1) VAP protocol (if indicated): Yes DVT prophylaxis: SCD GI prophylaxis: PPI Glucose control:  SSI Yes Central venous access:  N/A Arterial line:  N/A Foley:   N/A Mobility:  bed rest  PT consulted: N/A Last date of multidisciplinary goals of care discussion [per primary] Code Status:  full code Disposition: ICU  Labs   CBC: Recent Labs  Lab 09/07/20 0935 09/07/20 1924 09/08/20 0400 09/09/20 0316 09/10/20 0223  WBC 18.4*  --  12.8* 8.8 11.4*  NEUTROABS  --   --  11.1* 7.6 9.8*  HGB 14.6 12.9* 11.1* 10.8* 11.2*  HCT 46.1 38.0* 36.1* 33.9* 34.7*  MCV 96.2  --  98.6 95.2 94.3  PLT 133*  --  98* 102* 96*    Basic Metabolic Panel: Recent Labs  Lab 09/03/20 1802 09/04/20 0446 09/04/20 0956 09/04/20 1533 09/04/20 2116 09/05/20 0343 09/05/20 0955 09/06/20 1536 09/07/20 0046 09/07/20 1924 09/07/20 2120 09/08/20 0400 09/08/20 1110 09/08/20 2155 09/09/20 0316 09/09/20 1140 09/09/20 1810 09/10/20 0223  NA  --   --    < > 141   < > 147*   < > 160*   < > 163*   < > 155*   < > 154* 152* 156* 151* 151*  K  --   --    < >  --   --   --   --  3.9  --  3.9  --  4.1  --   --  4.8  --   --  4.8  CL  --   --   --   --   --   --   --  124*  --  126*  --  127*  --   --  125*  --   --  122*  CO2  --   --   --   --   --   --   --  24  --   --   --  21*  --   --  21*  --   --  24  GLUCOSE  --   --   --   --   --   --   --  274*  --  133*  --  283*  --   --  342*  --   --  257*  BUN  --   --   --   --   --   --   --  51*  --  45*  --  61*  --   --  64*  --   --  68*  CREATININE  --   --   --   --   --   --   --  1.22  --  1.20  --  1.64*  --   --  1.57*  --   --  1.60*  CALCIUM  --   --   --   --   --   --   --  8.4*  --   --   --  7.7*  --   --  8.2*  --   --  8.7*  MG 2.3 2.4  --  2.4  --  2.7*  --   --   --   --   --  2.9*  --   --   --   --   --   --  PHOS 2.8 1.9*  --  2.4*  --  2.2*  --   --   --   --   --   --   --   --   --   --   --   --    < > = values in this interval not displayed.   GFR: Estimated Creatinine Clearance: 47.2 mL/min (A) (by C-G formula based on SCr of 1.6 mg/dL (H)). Recent Labs  Lab 09/07/20 0935 09/08/20 0400  09/09/20 0316 09/10/20 0223  WBC 18.4* 12.8* 8.8 11.4*    Liver Function Tests: No results for input(s): AST, ALT, ALKPHOS, BILITOT, PROT, ALBUMIN in the last 168 hours. No results for input(s): LIPASE, AMYLASE in the last 168 hours. No results for input(s): AMMONIA in the last 168 hours.  ABG    Component Value Date/Time   PHART 7.466 (H) 09/04/2020 0956   PCO2ART 30.7 (L) 09/04/2020 0956   PO2ART 51 (L) 09/04/2020 0956   HCO3 22.2 09/04/2020 0956   TCO2 25 09/07/2020 1924   ACIDBASEDEF 1.0 09/04/2020 0956   O2SAT 88.0 09/04/2020 0956     Coagulation Profile: No results for input(s): INR, PROTIME in the last 168 hours.  Cardiac Enzymes: No results for input(s): CKTOTAL, CKMB, CKMBINDEX, TROPONINI in the last 168 hours.  HbA1C: Hgb A1c MFr Bld  Date/Time Value Ref Range Status  09/05/2020 10:05 AM 5.8 (H) 4.8 - 5.6 % Final    Comment:    (NOTE) Pre diabetes:          5.7%-6.4%  Diabetes:              >6.4%  Glycemic control for   <7.0% adults with diabetes   05/23/2019 12:16 PM 5.5 4.8 - 5.6 % Final    Comment:    (NOTE) Pre diabetes:          5.7%-6.4% Diabetes:              >6.4% Glycemic control for   <7.0% adults with diabetes     CBG: Recent Labs  Lab 09/09/20 1615 09/09/20 1921 09/10/20 0020 09/10/20 0412 09/10/20 0737  GLUCAP 226* 281* 251* 285* 246*     Critical care time: 31 minutes     Roselie Awkward, MD [facility] Pager: [phone] Cell: [phone] If no response, please call [phone] until 7pm After 7:00 pm call Elink  [phone]

## 2020-09-11 DIAGNOSIS — Z9889 Other specified postprocedural states: Secondary | ICD-10-CM | POA: Diagnosis not present

## 2020-09-11 DIAGNOSIS — J9600 Acute respiratory failure, unspecified whether with hypoxia or hypercapnia: Secondary | ICD-10-CM

## 2020-09-11 LAB — COMPREHENSIVE METABOLIC PANEL
ALT: 23 U/L (ref 0–44)
AST: 21 U/L (ref 15–41)
Albumin: 2.5 g/dL — ABNORMAL LOW (ref 3.5–5.0)
Alkaline Phosphatase: 53 U/L (ref 38–126)
Anion gap: 11 (ref 5–15)
BUN: 63 mg/dL — ABNORMAL HIGH (ref 8–23)
CO2: 25 mmol/L (ref 22–32)
Calcium: 8.6 mg/dL — ABNORMAL LOW (ref 8.9–10.3)
Chloride: 112 mmol/L — ABNORMAL HIGH (ref 98–111)
Creatinine, Ser: 1.42 mg/dL — ABNORMAL HIGH (ref 0.61–1.24)
GFR, Estimated: 51 mL/min — ABNORMAL LOW (ref >60)
Glucose, Bld: 212 mg/dL — ABNORMAL HIGH (ref 70–99)
Potassium: 4.4 mmol/L (ref 3.5–5.1)
Sodium: 148 mmol/L — ABNORMAL HIGH (ref 135–145)
Total Bilirubin: 0.6 mg/dL (ref 0.3–1.2)
Total Protein: 5.2 g/dL — ABNORMAL LOW (ref 6.5–8.1)

## 2020-09-11 LAB — CBC WITH DIFFERENTIAL/PLATELET
Abs Immature Granulocytes: 0.66 10*3/uL — ABNORMAL HIGH (ref 0.00–0.07)
Basophils Absolute: 0 10*3/uL (ref 0.0–0.1)
Basophils Relative: 0 %
Eosinophils Absolute: 0 10*3/uL (ref 0.0–0.5)
Eosinophils Relative: 0 %
HCT: 36.2 % — ABNORMAL LOW (ref 39.0–52.0)
Hemoglobin: 11.8 g/dL — ABNORMAL LOW (ref 13.0–17.0)
Immature Granulocytes: 4 %
Lymphocytes Relative: 6 %
Lymphs Abs: 0.9 10*3/uL (ref 0.7–4.0)
MCH: 30.1 pg (ref 26.0–34.0)
MCHC: 32.6 g/dL (ref 30.0–36.0)
MCV: 92.3 fL (ref 80.0–100.0)
Monocytes Absolute: 0.8 10*3/uL (ref 0.1–1.0)
Monocytes Relative: 5 %
Neutro Abs: 12.8 10*3/uL — ABNORMAL HIGH (ref 1.7–7.7)
Neutrophils Relative %: 85 %
Platelets: 136 10*3/uL — ABNORMAL LOW (ref 150–400)
RBC: 3.92 MIL/uL — ABNORMAL LOW (ref 4.22–5.81)
RDW: 14.7 % (ref 11.5–15.5)
WBC: 15.1 10*3/uL — ABNORMAL HIGH (ref 4.0–10.5)
nRBC: 0 % (ref 0.0–0.2)

## 2020-09-11 LAB — GLUCOSE, CAPILLARY
Glucose-Capillary: 205 mg/dL — ABNORMAL HIGH (ref 70–99)
Glucose-Capillary: 205 mg/dL — ABNORMAL HIGH (ref 70–99)
Glucose-Capillary: 150 mg/dL — ABNORMAL HIGH (ref 70–99)
Glucose-Capillary: 225 mg/dL — ABNORMAL HIGH (ref 70–99)
Glucose-Capillary: 237 mg/dL — ABNORMAL HIGH (ref 70–99)
Glucose-Capillary: 186 mg/dL — ABNORMAL HIGH (ref 70–99)

## 2020-09-11 LAB — SODIUM: Sodium: 146 mmol/L — ABNORMAL HIGH (ref 135–145)

## 2020-09-11 MED ORDER — FENTANYL CITRATE (PF) 100 MCG/2ML IJ SOLN
25.0000 ug | INTRAMUSCULAR | Status: DC | PRN
  Administered 2020-09-11: 50 ug via INTRAVENOUS
  Administered 2020-09-11: 100 ug via INTRAVENOUS
  Administered 2020-09-12: 50 ug via INTRAVENOUS
  Administered 2020-09-12 (×2): 100 ug via INTRAVENOUS
  Filled 2020-09-11 (×4): qty 2

## 2020-09-11 MED ORDER — FENTANYL CITRATE (PF) 100 MCG/2ML IJ SOLN
25.0000 ug | INTRAMUSCULAR | Status: DC | PRN
  Administered 2020-09-11: 50 ug via INTRAVENOUS
  Filled 2020-09-11 (×2): qty 2

## 2020-09-11 MED ORDER — MIDAZOLAM HCL 2 MG/2ML IJ SOLN
1.0000 mg | INTRAMUSCULAR | Status: DC | PRN
  Administered 2020-09-12: 1 mg via INTRAVENOUS
  Filled 2020-09-11: qty 2

## 2020-09-11 NOTE — Progress Notes (Signed)
NAME:  [name], MRN:  [id number], DOB:  1945/12/29, LOS: 56 ADMISSION DATE:  09/01/2020, CONSULTATION DATE:  4/26 REFERRING MD:  Christella Noa, CHIEF COMPLAINT:  Inability to protect airway   History of Present Illness:  75 y/o male presented to [facility] with memory loss and word finding problems, noted to have a mass lesion in his right superior temporal gyrus which was resected on 4/20 by Dr. Christella Noa.  He had post operative intraparenchymal hemorrhage.    Pertinent  Medical History  CAD Depression Kidney stones Prostate cancer Hypertension Macular degeneration OSA on CPAP Persistent atrial fibrillation DM2 Hard of hearing, has hearing aids  Significant Hospital Events: Including procedures, antibiotic start and stop dates in addition to other pertinent events    4/20 admit, to OR for resection of GBM  4/21 IPH  4/26 intubated  4/27 return to OR for craniotomy and evacuation of SDH.  4/27 CT head > most of the large right sided hematoma resected, improvement in mass effect, 74mm midline shift, post op pneumocephalus, some blood in ventriculs, extensive white matter hypodensity consistent with chronic microvascular ischemia  4/28 sedation stopped.   4/29 fent gtt off  Interim History / Subjective:   Tolerating PSV 10 currently I/O+ 10.7 L total  Objective   Blood pressure 121/83, pulse (!) 107, temperature 99.8 F (37.7 C), temperature source Oral, resp. rate (!) 25, height 6' (1.829 m), weight 96.1 kg, SpO2 95 %.    Vent Mode: PSV;CPAP FiO2 (%):  [40 %] 40 % Set Rate:  [18 bmp] 18 bmp Vt Set:  [620 mL] 620 mL PEEP:  [5 cmH20-8 cmH20] 5 cmH20 Pressure Support:  [5 cmH20-10 cmH20] 10 cmH20 Plateau Pressure:  [13 cmH20-22 cmH20] 17 cmH20   Intake/Output Summary (Last 24 hours) at 09/11/2020 0758 Last data filed at 09/11/2020 0700 Gross per 24 hour  Intake 2704.31 ml  Output 1985 ml  Net 719.31 ml   Filed Weights   09/07/20 1000 09/10/20 0500 09/11/20 0500  Weight:  90.3 kg 96.2 kg 96.1 kg    Examination:  General: Elderly gentleman laying in bed, ventilated HENT: Scalp dressing in place, ET tube in place, G-tube PULM: Clear bilaterally, distant, no wheeze CV: Regular, 90s, distant, no murmur GI: Nondistended, soft, positive bowel sounds MSK: No deformities, old scar left knee Neuro: Did not open his eyes to voice or pain, lifted his right arm to command without coordination, localize to pain with his right hand, did not move his right lower extremity on request, no movement on the left    Labs/imaging that I havepersonally reviewed  (right click and "Reselect all SmartList Selections" daily)   Tmax 101.0 Sodium stable at 148 Serum creatinine improved 1.60 >> 1.42 Hemoglobin stable WBC 11.4 >> 15.1   Resolved Hospital Problem list     Assessment & Plan:  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  Airway protection is likely the principal obstacle to extubation at this time.  Hopefully as mental status clears we will be  able to reconsider an extubation even 4/30 -If he were to fail extubation, has more prolonged airway protection compromise then we will consider tracheostomy.  I discussed this with Dr. Kathyrn Sheriff and with the patient's wife at bedside this morning.  Atrial fibrillation CAD hx CABG, hypertension -Anticoagulation held -Continue metoprolol 25  mg twice daily -Continue to hold home losartan, Lasix.  Add back depending on his critical care course  OSA on CPAP at home -He would need CPAP postextubation.  Will need to ensure that this is going to be possible, not interrupt his craniotomy dressing  AKI> stable, improving -Continue to follow BMP, urine output -Avoid nephrotoxins -Replace electrolytes if/when indicated  Hypernatremia: iatrogenic as was on hypertonic saline -Avoiding rapid correction.  Plan to continue current free water 200 cc every 8 hours and follow BMP  Hyperglycemia -Sliding scale resistant (on steroids) -Tube feed coverage 9 units every 4 hours    Best practice (right click and "Reselect all SmartList Selections" daily)  Diet:  Tube Feed  Pain/Anxiety/Delirium protocol (if indicated): Yes (RASS goal 0) VAP protocol (if indicated): Yes DVT prophylaxis: SCD GI prophylaxis: PPI Glucose control:  SSI Yes Central venous access:  N/A Arterial line:  N/A Foley:  N/A Mobility:  bed rest  PT consulted: N/A Last date of multidisciplinary goals of care discussion [per primary] Code Status:  full code Disposition: ICU  Labs   CBC: Recent Labs  Lab 09/07/20 0935 09/07/20 1924 09/08/20 0400 09/09/20 0316 09/10/20 0223 09/11/20 0314  WBC 18.4*  --  12.8* 8.8 11.4* 15.1*  NEUTROABS  --   --  11.1* 7.6 9.8* 12.8*  HGB 14.6 12.9* 11.1* 10.8* 11.2* 11.8*  HCT 46.1 38.0* 36.1* 33.9* 34.7* 36.2*  MCV 96.2  --  98.6 95.2 94.3 92.3  PLT 133*  --  98* 102* 96* 136*    Basic Metabolic Panel: Recent Labs  Lab 09/04/20 1533 09/04/20 2116 09/05/20 0343 09/05/20 0955 09/06/20 1536 09/07/20 0046 09/07/20 1924 09/07/20 2120 09/08/20 0400 09/08/20 1110 09/09/20 0316 09/09/20 1140 09/09/20 1810 09/10/20 0223 09/10/20 1226 09/10/20 1744 09/11/20 0314  NA 141   < > 147*   < > 160*   < > 163*   < > 155*   < > 152*   < > 151* 151* 152* 148* 148*  K  --   --   --   --  3.9  --  3.9  --  4.1  --  4.8  --    --  4.8  --   --  4.4  CL  --   --   --    < > 124*  --  126*  --  127*  --  125*  --   --  122*  --   --  112*  CO2  --   --   --   --  24  --   --   --  21*  --  21*  --   --  24  --   --  25  GLUCOSE  --   --   --    < > 274*  --  133*  --  283*  --  342*  --   --  257*  --   --  212*  BUN  --   --   --    < > 51*  --  45*  --  61*  --  64*  --   --  68*  --   --  63*  CREATININE  --   --   --    < > 1.22  --  1.20  --  1.64*  --  1.57*  --   --  1.60*  --   --  1.42*  CALCIUM  --   --   --   --  8.4*  --   --   --  7.7*  --  8.2*  --   --  8.7*  --   --  8.6*  MG 2.4  --  2.7*  --   --   --   --   --  2.9*  --   --   --   --   --   --   --   --   PHOS 2.4*  --  2.2*  --   --   --   --   --   --   --   --   --   --   --   --   --   --    < > = values in this interval not displayed.   GFR: Estimated Creatinine Clearance: 53.2 mL/min (A) (by C-G formula based on SCr of 1.42 mg/dL (H)). Recent Labs  Lab 09/08/20 0400 09/09/20 0316 09/10/20 0223 09/11/20 0314  WBC 12.8* 8.8 11.4* 15.1*    Liver Function Tests: Recent Labs  Lab 09/11/20 0314  AST 21  ALT 23  ALKPHOS 53  BILITOT 0.6  PROT 5.2*  ALBUMIN 2.5*   No results for input(s): LIPASE, AMYLASE in the last 168 hours. No results for input(s): AMMONIA in the last 168 hours.  ABG    Component Value Date/Time   PHART 7.466 (H) 09/04/2020 0956   PCO2ART 30.7 (L) 09/04/2020 0956   PO2ART 51 (L) 09/04/2020 0956   HCO3 22.2 09/04/2020 0956   TCO2 25 09/07/2020 1924   ACIDBASEDEF 1.0 09/04/2020 0956   O2SAT 88.0 09/04/2020 0956     Coagulation Profile: No results for input(s): INR, PROTIME in the last 168 hours.  Cardiac Enzymes: No results for input(s): CKTOTAL, CKMB, CKMBINDEX, TROPONINI in the last 168 hours.  HbA1C: Hgb A1c MFr Bld  Date/Time Value Ref Range Status  09/05/2020 10:05 AM 5.8 (H) 4.8 - 5.6 % Final    Comment:    (NOTE) Pre diabetes:          5.7%-6.4%  Diabetes:              >6.4%  Glycemic  control for   <7.0% adults with diabetes   05/23/2019 12:16 PM 5.5 4.8 - 5.6 % Final    Comment:    (NOTE) Pre diabetes:          5.7%-6.4% Diabetes:              >6.4% Glycemic control for   <7.0% adults with diabetes     CBG: Recent Labs  Lab 09/10/20 1521 09/10/20 1911 09/10/20 2314 09/11/20 0321 09/11/20 0739  GLUCAP 221* 229* 217* 205* 205*     Critical care time: 32 minutes     Baltazar Apo, MD, PhD 09/11/2020, 8:15 AM [facility] [phone] or if no answer before 7:00PM call [phone] For any issues after 7:00PM please call eLink [phone]

## 2020-09-11 NOTE — Progress Notes (Signed)
  NEUROSURGERY PROGRESS NOTE   No issues overnight.   EXAM:  BP 117/82 (BP Location: Left Arm)   Pulse 84   Temp 99.8 F (37.7 C) (Oral)   Resp (!) 24   Ht 6' (1.829 m)   Wt 96.1 kg   SpO2 96%   BMI 28.73 kg/m   Somnolent but arouses to voice, opens eyes On vent, breathing spontaneously Follows commands RUE/RLE Minimal non-purposeful movement LLE, no movement LUE Headwrap in place, c/d/i   IMPRESSION:  75 y.o. male POD# 4 s/p right frontoparietal hematoma evacuation. Remains stable with left hemiplegia.   PLAN: - Cont to monitor neurologic exam. If level of consciousness improves can consider extubation per PCCM - Cont dexamethasone and Keppra   Consuella Lose, MD [facility]

## 2020-09-12 ENCOUNTER — Inpatient Hospital Stay (HOSPITAL_COMMUNITY): Payer: Medicare Other

## 2020-09-12 DIAGNOSIS — J9600 Acute respiratory failure, unspecified whether with hypoxia or hypercapnia: Secondary | ICD-10-CM | POA: Diagnosis not present

## 2020-09-12 DIAGNOSIS — Z01818 Encounter for other preprocedural examination: Secondary | ICD-10-CM | POA: Diagnosis not present

## 2020-09-12 LAB — BASIC METABOLIC PANEL
Anion gap: 8 (ref 5–15)
BUN: 61 mg/dL — ABNORMAL HIGH (ref 8–23)
CO2: 25 mmol/L (ref 22–32)
Calcium: 8.7 mg/dL — ABNORMAL LOW (ref 8.9–10.3)
Chloride: 112 mmol/L — ABNORMAL HIGH (ref 98–111)
Creatinine, Ser: 1.27 mg/dL — ABNORMAL HIGH (ref 0.61–1.24)
GFR, Estimated: 59 mL/min — ABNORMAL LOW (ref >60)
Glucose, Bld: 236 mg/dL — ABNORMAL HIGH (ref 70–99)
Potassium: 4.4 mmol/L (ref 3.5–5.1)
Sodium: 145 mmol/L (ref 135–145)

## 2020-09-12 LAB — GLUCOSE, CAPILLARY
Glucose-Capillary: 241 mg/dL — ABNORMAL HIGH (ref 70–99)
Glucose-Capillary: 277 mg/dL — ABNORMAL HIGH (ref 70–99)
Glucose-Capillary: 223 mg/dL — ABNORMAL HIGH (ref 70–99)
Glucose-Capillary: 201 mg/dL — ABNORMAL HIGH (ref 70–99)
Glucose-Capillary: 195 mg/dL — ABNORMAL HIGH (ref 70–99)
Glucose-Capillary: 156 mg/dL — ABNORMAL HIGH (ref 70–99)

## 2020-09-12 LAB — CBC
HCT: 37.5 % — ABNORMAL LOW (ref 39.0–52.0)
Hemoglobin: 12.1 g/dL — ABNORMAL LOW (ref 13.0–17.0)
MCH: 30 pg (ref 26.0–34.0)
MCHC: 32.3 g/dL (ref 30.0–36.0)
MCV: 93.1 fL (ref 80.0–100.0)
Platelets: 144 10*3/uL — ABNORMAL LOW (ref 150–400)
RBC: 4.03 MIL/uL — ABNORMAL LOW (ref 4.22–5.81)
RDW: 14.6 % (ref 11.5–15.5)
WBC: 20.7 10*3/uL — ABNORMAL HIGH (ref 4.0–10.5)
nRBC: 0 % (ref 0.0–0.2)

## 2020-09-12 LAB — PHOSPHORUS: Phosphorus: 3.9 mg/dL (ref 2.5–4.6)

## 2020-09-12 LAB — MAGNESIUM: Magnesium: 2.5 mg/dL — ABNORMAL HIGH (ref 1.7–2.4)

## 2020-09-12 MED ORDER — FUROSEMIDE 10 MG/ML IJ SOLN
40.0000 mg | Freq: Once | INTRAMUSCULAR | Status: AC
  Administered 2020-09-12: 40 mg via INTRAVENOUS
  Filled 2020-09-12: qty 4

## 2020-09-12 MED ORDER — INSULIN ASPART 100 UNIT/ML ~~LOC~~ SOLN
12.0000 [IU] | SUBCUTANEOUS | Status: DC
  Administered 2020-09-12 – 2020-09-18 (×34): 12 [IU] via SUBCUTANEOUS

## 2020-09-12 NOTE — Progress Notes (Signed)
NAME:  [name], MRN:  [id number], DOB:  1946/04/24, LOS: 52 ADMISSION DATE:  09/01/2020, CONSULTATION DATE:  4/26 REFERRING MD:  Christella Noa, CHIEF COMPLAINT:  Inability to protect airway   History of Present Illness:  75 y/o male presented to [facility] with memory loss and word finding problems, noted to have a mass lesion in his right superior temporal gyrus which was resected on 4/20 by Dr. Christella Noa.  He had post operative intraparenchymal hemorrhage.    Pertinent  Medical History  CAD Depression Kidney stones Prostate cancer Hypertension Macular degeneration OSA on CPAP Persistent atrial fibrillation DM2 Hard of hearing, has hearing aids  Significant Hospital Events: Including procedures, antibiotic start and stop dates in addition to other pertinent events    4/20 admit, to OR for resection of GBM  4/21 IPH  4/26 intubated  4/27 return to OR for craniotomy and evacuation of SDH.  4/27 CT head > most of the large right sided hematoma resected, improvement in mass effect, 18mm midline shift, post op pneumocephalus, some blood in ventriculs, extensive white matter hypodensity consistent with chronic microvascular ischemia  4/28 sedation stopped.   4/29 fent gtt off  Interim History / Subjective:   Afebrile Na 148 >> 145 S Cr 1.42 > 1.27 WBC 11.4 > 15.1 > 20.7 I/O +9.6L total  Objective   Blood pressure 118/84, pulse 90, temperature 98.4 F (36.9 C), temperature source Oral, resp. rate (!) 23, height 6' (1.829 m), weight 96.2 kg, SpO2 99 %.    Vent Mode: PRVC FiO2 (%):  [40 %] 40 % Set Rate:  [14 bmp-18 bmp] 14 bmp Vt Set:  [620 mL] 620 mL PEEP:  [5 cmH20] 5 cmH20 Pressure Support:  [10 cmH20] 10 cmH20 Plateau Pressure:  [12 cmH20-18 cmH20] 18 cmH20   Intake/Output Summary (Last 24 hours) at 09/12/2020 0711 Last data filed at 09/12/2020 0700 Gross per 24 hour  Intake 2434.85 ml  Output 2050 ml  Net 384.85 ml   Filed Weights   09/10/20 0500 09/11/20 0500 09/12/20  0500  Weight: 96.2 kg 96.1 kg 96.2 kg    Examination:  General: Elderly man in bed, no distress HENT: Scalp dressing in place, ET tube, G-tube in place PULM: Tachypneic 30, mild abdominal muscle use, no wheezes or crackles CV: Distant, regular, no murmur GI: Nondistended, soft, positive bowel sounds MSK: No deformity, old scar left knee Neuro: Partially opened eyes to voice.  He spontaneously moving his right arm and follow commands on the right.  Flaccid on the left.  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  Wakefulness and airway protection are limiting factors for extubation at this time -Continue cefazolin as ordered.  Given increased secretions, leukocytosis plan to repeat respiratory culture 5/1, modify antibiotics if indicated. CXR 5/1 reassuring -If he were to fail extubation or does not progress to  extubation and requires more prolonged airway protection due to neurological status then would consider tracheostomy.  Discussed with wife and Dr. Kathyrn Sheriff on 4/30  Atrial fibrillation CAD hx CABG, hypertension -Continue to hold anticoagulation -Continue metoprolol 25 mg twice a day as ordered -Home losartan, Lasix on hold, add back depending on  course  OSA on CPAP at home -Will need CPAP postextubation, need to ensure that this will be possible and will not interrupt his craniotomy dressing  AKI> stable, improving. Now  -Continue to follow BMP, urine output -with improving S Cr and positive fluid balance, plan for single dose lasix 5/1 and follow -Avoid nephrotoxins -Replace electrolytes when indicated  Hypernatremia: iatrogenic as was on hypertonic saline -Drifting down, most recent 145.  Avoiding rapid correction.  Plan to continue his current free water 200 cc every 8 hours and follow BMP  Hyperglycemia -Continue sliding scale resistant (on steroids) -Continue tube feed coverage 9 units every 4 hours    Best practice (right click and "Reselect all SmartList Selections" daily)  Diet:  Tube Feed  Pain/Anxiety/Delirium protocol (if indicated): Yes (RASS goal 0) VAP protocol (if indicated): Yes DVT prophylaxis: SCD GI prophylaxis: PPI Glucose control:  SSI Yes Central venous access:  N/A Arterial line:  N/A Foley:  N/A Mobility:  bed rest  PT consulted: N/A Last date of multidisciplinary goals of care discussion [per primary] Code Status:  full code Disposition: ICU  Labs   CBC: Recent Labs  Lab 09/08/20 0400 09/09/20 0316 09/10/20 0223 09/11/20 0314 09/12/20 0159  WBC 12.8* 8.8 11.4* 15.1* 20.7*  NEUTROABS 11.1* 7.6 9.8* 12.8*  --   HGB 11.1* 10.8* 11.2* 11.8* 12.1*  HCT 36.1* 33.9* 34.7* 36.2* 37.5*  MCV 98.6 95.2 94.3 92.3 93.1  PLT 98* 102* 96* 136* 144*    Basic Metabolic Panel: Recent Labs  Lab 09/08/20 0400 09/08/20 1110 09/09/20 0316 09/09/20 1140 09/10/20 0223 09/10/20 1226 09/10/20 1744 09/11/20 0314 09/11/20 1711 09/12/20 0159  NA 155*   < > 152*   < > 151* 152* 148* 148* 146* 145  K 4.1  --  4.8  --  4.8  --   --  4.4  --  4.4  CL 127*  --  125*  --  122*  --   --  112*  --  112*  CO2 21*  --  21*  --  24  --   --  25  --  25  GLUCOSE 283*  --  342*  --  257*  --   --  212*   --  236*  BUN 61*  --  64*  --  68*  --   --  63*  --  61*  CREATININE 1.64*  --  1.57*  --  1.60*  --   --  1.42*  --  1.27*  CALCIUM 7.7*  --  8.2*  --  8.7*  --   --  8.6*  --  8.7*  MG 2.9*  --   --   --   --   --   --   --   --  2.5*  PHOS  --   --   --   --   --   --   --   --   --  3.9   < > = values in this interval not displayed.   GFR: Estimated Creatinine Clearance: 59.5 mL/min (A) (by C-G formula based on SCr of 1.27 mg/dL (H)). Recent Labs  Lab 09/09/20 0316 09/10/20 0223 09/11/20 0314 09/12/20 0159  WBC 8.8 11.4* 15.1* 20.7*    Liver Function Tests: Recent Labs  Lab 09/11/20 0314  AST 21  ALT 23  ALKPHOS 53  BILITOT 0.6  PROT 5.2*  ALBUMIN 2.5*   No results for input(s): LIPASE, AMYLASE in the last 168 hours. No results for input(s): AMMONIA in the last 168 hours.  ABG    Component Value Date/Time   PHART 7.466 (H) 09/04/2020 0956   PCO2ART 30.7 (L) 09/04/2020 0956   PO2ART 51 (L) 09/04/2020 0956   HCO3 22.2 09/04/2020 0956   TCO2 25 09/07/2020 1924   ACIDBASEDEF 1.0 09/04/2020 0956   O2SAT 88.0 09/04/2020 0956     Coagulation Profile: No results for input(s): INR, PROTIME in the last 168 hours.  Cardiac Enzymes: No results for input(s): CKTOTAL, CKMB, CKMBINDEX, TROPONINI in the last 168 hours.  HbA1C: Hgb A1c MFr Bld  Date/Time Value Ref Range Status  09/05/2020 10:05 AM 5.8 (H) 4.8 - 5.6 % Final    Comment:    (NOTE) Pre diabetes:          5.7%-6.4%  Diabetes:              >6.4%  Glycemic control for   <7.0% adults with diabetes   05/23/2019 12:16 PM 5.5 4.8 - 5.6 % Final    Comment:    (NOTE) Pre diabetes:          5.7%-6.4% Diabetes:              >6.4% Glycemic control for   <7.0% adults with diabetes     CBG: Recent Labs  Lab 09/11/20 1125 09/11/20 1529 09/11/20 1904 09/11/20 2259 09/12/20 0314  GLUCAP 150* 225* 237* 186* 241*     Critical care time: 31 minutes     Baltazar Apo, MD, PhD 09/12/2020, 7:11  AM [facility] [phone] or if no answer before 7:00PM call [phone] For any issues after 7:00PM please call eLink [phone]

## 2020-09-12 NOTE — Progress Notes (Signed)
NEUROSURGERY PROGRESS NOTE  Postop day 5 right frontoparietal Craney for subdural hematoma. Somnolent but opens eyes to command.  He does follow commands on his right upper and lower extremity.  Dressing clean dry and intact.  Left hemiplegia.  Nurse states that his sputum has changed colors and become more thick and yellow.  May need repeat respiratory cultures?  Will let CCM manage this.  Temp:  [98.4 F (36.9 C)-99.1 F (37.3 C)] 98.4 F (36.9 C) (05/01 0400) Pulse Rate:  [83-107] 88 (05/01 0600) Resp:  [17-28] 22 (05/01 0600) BP: (100-142)/(67-96) 116/74 (05/01 0600) SpO2:  [93 %-99 %] 96 % (05/01 0600) FiO2 (%):  [40 %] 40 % (05/01 0400) Weight:  [96.2 kg] 96.2 kg (05/01 0500)   Eleonore Chiquito, NP 09/12/2020 6:18 AM

## 2020-09-13 ENCOUNTER — Inpatient Hospital Stay (HOSPITAL_COMMUNITY): Payer: Medicare Other

## 2020-09-13 DIAGNOSIS — N179 Acute kidney failure, unspecified: Secondary | ICD-10-CM | POA: Diagnosis not present

## 2020-09-13 DIAGNOSIS — E870 Hyperosmolality and hypernatremia: Secondary | ICD-10-CM | POA: Diagnosis not present

## 2020-09-13 DIAGNOSIS — J9621 Acute and chronic respiratory failure with hypoxia: Secondary | ICD-10-CM | POA: Diagnosis not present

## 2020-09-13 LAB — CBC
HCT: 34.4 % — ABNORMAL LOW (ref 39.0–52.0)
Hemoglobin: 11.2 g/dL — ABNORMAL LOW (ref 13.0–17.0)
MCH: 30.2 pg (ref 26.0–34.0)
MCHC: 32.6 g/dL (ref 30.0–36.0)
MCV: 92.7 fL (ref 80.0–100.0)
Platelets: 142 10*3/uL — ABNORMAL LOW (ref 150–400)
RBC: 3.71 MIL/uL — ABNORMAL LOW (ref 4.22–5.81)
RDW: 14.6 % (ref 11.5–15.5)
WBC: 14.3 10*3/uL — ABNORMAL HIGH (ref 4.0–10.5)
nRBC: 0 % (ref 0.0–0.2)

## 2020-09-13 LAB — BASIC METABOLIC PANEL
Anion gap: 11 (ref 5–15)
BUN: 69 mg/dL — ABNORMAL HIGH (ref 8–23)
CO2: 27 mmol/L (ref 22–32)
Calcium: 8.6 mg/dL — ABNORMAL LOW (ref 8.9–10.3)
Chloride: 109 mmol/L (ref 98–111)
Creatinine, Ser: 1.37 mg/dL — ABNORMAL HIGH (ref 0.61–1.24)
GFR, Estimated: 53 mL/min — ABNORMAL LOW (ref >60)
Glucose, Bld: 242 mg/dL — ABNORMAL HIGH (ref 70–99)
Potassium: 4.2 mmol/L (ref 3.5–5.1)
Sodium: 147 mmol/L — ABNORMAL HIGH (ref 135–145)

## 2020-09-13 LAB — GLUCOSE, CAPILLARY
Glucose-Capillary: 192 mg/dL — ABNORMAL HIGH (ref 70–99)
Glucose-Capillary: 239 mg/dL — ABNORMAL HIGH (ref 70–99)
Glucose-Capillary: 197 mg/dL — ABNORMAL HIGH (ref 70–99)
Glucose-Capillary: 164 mg/dL — ABNORMAL HIGH (ref 70–99)
Glucose-Capillary: 151 mg/dL — ABNORMAL HIGH (ref 70–99)
Glucose-Capillary: 200 mg/dL — ABNORMAL HIGH (ref 70–99)

## 2020-09-13 LAB — MAGNESIUM: Magnesium: 2.6 mg/dL — ABNORMAL HIGH (ref 1.7–2.4)

## 2020-09-13 MED ORDER — FENTANYL CITRATE (PF) 100 MCG/2ML IJ SOLN
25.0000 ug | INTRAMUSCULAR | Status: DC | PRN
  Administered 2020-09-13: 25 ug via INTRAVENOUS
  Filled 2020-09-13: qty 2

## 2020-09-13 MED ORDER — ORAL CARE MOUTH RINSE
15.0000 mL | Freq: Two times a day (BID) | OROMUCOSAL | Status: DC
  Administered 2020-09-14 – 2020-10-02 (×36): 15 mL via OROMUCOSAL

## 2020-09-13 MED ORDER — CHLORHEXIDINE GLUCONATE 0.12 % MT SOLN
15.0000 mL | Freq: Two times a day (BID) | OROMUCOSAL | Status: DC
  Administered 2020-09-13 – 2020-10-02 (×38): 15 mL via OROMUCOSAL
  Filled 2020-09-13 (×22): qty 15

## 2020-09-13 MED ORDER — OXYCODONE HCL 5 MG/5ML PO SOLN
5.0000 mg | Freq: Four times a day (QID) | ORAL | Status: DC | PRN
  Administered 2020-09-13: 5 mg
  Filled 2020-09-13: qty 5

## 2020-09-13 NOTE — Progress Notes (Signed)
[facility]  Much more Awake, making some purposeful movements Will extubate, discuss with wife today, if fails will need tracheostomy  Roselie Awkward, MD [facility] Pager: [phone] Cell: [phone] If no response, please call [phone] until 7pm After 7:00 pm call Elink  [phone]

## 2020-09-13 NOTE — Procedures (Signed)
Extubation Procedure Note  Patient Details:   Name: [name] DOB: 01/29/1946 MRN: [id number]   Airway Documentation:    Vent end date: 09/13/20 Vent end time: 1740   Evaluation  O2 sats: stable throughout Complications: No apparent complications Patient did tolerate procedure well. Bilateral Breath Sounds: Diminished   Yes  Gonzella Lex 09/13/2020, 5:43 PM

## 2020-09-13 NOTE — Progress Notes (Signed)
NAME:  [name], MRN:  [id number], DOB:  Dec 27, 1945, LOS: 12 ADMISSION DATE:  09/01/2020, CONSULTATION DATE:  4/26 REFERRING MD:  Christella Noa, CHIEF COMPLAINT:  Inability to protect airway   History of Present Illness:  75 y/o male presented to [facility] with memory loss and word finding problems, noted to have a mass lesion in his right superior temporal gyrus which was resected on 4/20 by Dr. Christella Noa.  He had post operative intraparenchymal hemorrhage. He remains critically ill.    Pertinent  Medical History  CAD Depression Kidney stones Prostate cancer Hypertension Macular degeneration OSA on CPAP Persistent atrial fibrillation DM2 Hard of hearing, has hearing aids  Significant Hospital Events: Including procedures, antibiotic start and stop dates in addition to other pertinent events    4/20 admit, to OR for resection of GBM  4/21 IPH  4/26 intubated  4/27 return to OR for craniotomy and evacuation of SDH.  4/27 CT head > most of the large right sided hematoma resected, improvement in mass effect, 70mm midline shift, post op pneumocephalus, some blood in ventriculs, extensive white matter hypodensity consistent with chronic microvascular ischemia  4/28 sedation stopped. Cefazolin>   4/29 fent gtt off  5/1 Trach Aspirate >  Interim History / Subjective:  Tolerated PS trials yesterday, rested overnight, on PS this AM  NAE overnight  Tmax 99.2  +696 ml 24 hours, +10.3L Admit, 2.1 L UOP  Remains intubated/sedated  Unable to obtain subjective evaluation due to patient status  Objective   Blood pressure 107/74, pulse 90, temperature 98.1 F (36.7 C), temperature source Axillary, resp. rate (!) 26, height 6' (1.829 m), weight 95.8 kg, SpO2 100 %.    Vent Mode: PSV;CPAP FiO2 (%):  [40 %] 40 % Set Rate:  [14 bmp] 14 bmp Vt Set:  [620 mL] 620 mL PEEP:  [5 cmH20] 5 cmH20 Pressure Support:  [5 cmH20-14 cmH20] 5 cmH20 Plateau Pressure:  [12 cmH20-19 cmH20] 12 cmH20    Intake/Output Summary (Last 24 hours) at 09/13/2020 6160 Last data filed at 09/13/2020 0700 Gross per 24 hour  Intake 2821.08 ml  Output 2125 ml  Net 696.08 ml   Filed Weights   09/11/20 0500 09/12/20 0500 09/13/20 0500  Weight: 96.1 kg 96.2 kg 95.8 kg    Examination:  General: Elderly man, in bed, no acute distress   HEENT: MM pink/moist, anicteric, trachea midline, ETT, Cortrak, crani staples C/D/I  Neuro: GCS10T, eyes open spontaneously, purposeful , RASS 0, PERRL 81mm CV: S1S2, Afib, no m/r/g appreciated PULM:  Clear in the upper lobes, diminished in the lower lobes, small tan secretions, chest expansion symmetric GI: soft, bsx4 active, non tender Extremities: warm/dry, no edema appreciated, capillary refill less than 3 seconds  Skin: no rashes or lesions  Labs/imaging that I havepersonally reviewed  (right click and "Reselect all SmartList Selections" daily)  BMP, CBC, CXR, Trach Asp  Resolved Hospital Problem list     Assessment & Plan:  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 Decreased amount of fentanyl IV, added PRN oxy 5mg  q6h for longer acting pain control. Goal RASS 0, CPOT 0. -On gabapentin at home, will consider adding if pain control inadequate.   Acute hypoxemic respiratory failure due to aspiration  and poor airway protection MSSA aspiration PNA WBC/Fever curve downtrending 15>20.7>14.3. CXR 5/2 slight improvement in aeration, mild bibasilar atelectasis, stable lines -LTVV strategy with tidal volumes of 4-8 cc/kg ideal body weight -Daily SAT/SBT, on SBT currently. Continue to evaluate readiness to extubate. Not currently following commands.  Unsure of ability to protect airway post-extubation. -Wean PEEP/FiO2 for SpO2 greater than 92 -Continue Cefazolin for 5 days (5/3). Follow up Trach aspirate, narrow cultures as indicated. -Follow intermittent CXR and ABG PRN -Continue VAP bundle with pulmonary hygiene.  -PAD bundle as discussed above   Atrial fibrillation CAD hx CABG, hypertension Rate controlled -Continue to hold AC -Continue Metop 25 mg BID -Holding home losartan, amlodipine, and lasix. -Continue Telemetry    OSA on CPAP at home -Initiate CPAP post extubation. Monitor craniotomy site with CPAP straps.   AKI> stable, improving Creat 1.42> 1.27> 1.37. Downtrending, but slight bump after 5/1 lasix. Remains 10L positive this admission. -Ensure renal perfusion. Goal MAP 65 or greater. -Avoid neprotoxic drugs as possible. -Holding diuresis today after creatinine bump. Will consider diuresis again tomorrow. -Strict I&O's.  -Follow up AM creatinine  Hypernatremia: iatrogenic as was on hypertonic saline NA 145 to 147 -Continue free water at 200cc q8H -Monitor BMP NA level -Avoid rapid correction of NA  Hyperglycemia BG 156-242 -Continue SSI resistant on steroids -Continue TF coverage 9U Q4H -Continue to monitor BG today, will consider long acting insulin.   Best practice (right click and "Reselect all SmartList Selections" daily)  Diet:  Tube Feed  Pain/Anxiety/Delirium protocol (if indicated): Yes (RASS goal 0) VAP protocol (if indicated): Yes DVT prophylaxis: SCD GI prophylaxis: PPI Glucose control:  SSI Yes Central venous access:  N/A Arterial line:  N/A Foley:  N/A Mobility:  bed rest  PT consulted: N/A Last date of multidisciplinary goals of care discussion [Per Primary] Code Status:  full code Disposition: ICU   Critical care time: 31 minutes    Redmond [employer]., MSN, APRN, AGACNP-BC [facility]  09/13/2020 , 8:29 AM  Please see [email or web address] for pager  details  If no response, please call [phone] After hours, please call Elink at [phone]

## 2020-09-13 NOTE — Progress Notes (Signed)
Patient ID: [name], male   DOB: 1946-04-26, 75 y.o.   MRN: [id number] BP 114/82   Pulse 91   Temp 98.3 F (36.8 C) (Axillary)   Resp (!) 23   Ht 6' (1.829 m)   Wt 95.8 kg   SpO2 96%   BMI 28.64 kg/m  Alert, following commands Plegic on left side Speech is dysarthric Left facial droop Much improved, extubated today Wound is healing well.

## 2020-09-13 NOTE — Plan of Care (Signed)
  Problem: Nutrition: Goal: Adequate nutrition will be maintained Outcome: Progressing   Problem: Elimination: Goal: Will not experience complications related to bowel motility Outcome: Progressing Goal: Will not experience complications related to urinary retention Outcome: Progressing   

## 2020-09-14 ENCOUNTER — Other Ambulatory Visit: Payer: Self-pay | Admitting: Radiation Therapy

## 2020-09-14 ENCOUNTER — Ambulatory Visit
Admit: 2020-09-14 | Discharge: 2020-09-14 | Disposition: A | Discharge status: Home / Self Care | Payer: Medicare Other | Source: Ambulatory Visit | Attending: Radiation Oncology | Admitting: Radiation Oncology

## 2020-09-14 DIAGNOSIS — R5383 Other fatigue: Secondary | ICD-10-CM | POA: Diagnosis not present

## 2020-09-14 DIAGNOSIS — C712 Malignant neoplasm of temporal lobe: Secondary | ICD-10-CM

## 2020-09-14 DIAGNOSIS — C711 Malignant neoplasm of frontal lobe: Secondary | ICD-10-CM

## 2020-09-14 DIAGNOSIS — R0902 Hypoxemia: Secondary | ICD-10-CM | POA: Diagnosis not present

## 2020-09-14 DIAGNOSIS — J9621 Acute and chronic respiratory failure with hypoxia: Secondary | ICD-10-CM | POA: Diagnosis not present

## 2020-09-14 LAB — GLUCOSE, CAPILLARY
Glucose-Capillary: 221 mg/dL — ABNORMAL HIGH (ref 70–99)
Glucose-Capillary: 209 mg/dL — ABNORMAL HIGH (ref 70–99)
Glucose-Capillary: 170 mg/dL — ABNORMAL HIGH (ref 70–99)
Glucose-Capillary: 237 mg/dL — ABNORMAL HIGH (ref 70–99)
Glucose-Capillary: 236 mg/dL — ABNORMAL HIGH (ref 70–99)
Glucose-Capillary: 193 mg/dL — ABNORMAL HIGH (ref 70–99)

## 2020-09-14 LAB — CBC
HCT: 34.8 % — ABNORMAL LOW (ref 39.0–52.0)
Hemoglobin: 11.6 g/dL — ABNORMAL LOW (ref 13.0–17.0)
MCH: 30.8 pg (ref 26.0–34.0)
MCHC: 33.3 g/dL (ref 30.0–36.0)
MCV: 92.3 fL (ref 80.0–100.0)
Platelets: 135 K/uL — ABNORMAL LOW (ref 150–400)
RBC: 3.77 MIL/uL — ABNORMAL LOW (ref 4.22–5.81)
RDW: 14.6 % (ref 11.5–15.5)
WBC: 13.9 K/uL — ABNORMAL HIGH (ref 4.0–10.5)
nRBC: 0 % (ref 0.0–0.2)

## 2020-09-14 LAB — BASIC METABOLIC PANEL WITH GFR
Anion gap: 8 (ref 5–15)
BUN: 62 mg/dL — ABNORMAL HIGH (ref 8–23)
CO2: 26 mmol/L (ref 22–32)
Calcium: 8.6 mg/dL — ABNORMAL LOW (ref 8.9–10.3)
Chloride: 110 mmol/L (ref 98–111)
Creatinine, Ser: 1.23 mg/dL (ref 0.61–1.24)
GFR, Estimated: >60 mL/min
Glucose, Bld: 225 mg/dL — ABNORMAL HIGH (ref 70–99)
Potassium: 4.1 mmol/L (ref 3.5–5.1)
Sodium: 144 mmol/L (ref 135–145)

## 2020-09-14 LAB — POCT I-STAT 7, (LYTES, BLD GAS, ICA,H+H)
Acid-base deficit: 1 mmol/L (ref 0.0–2.0)
Acid-base deficit: 2 mmol/L (ref 0.0–2.0)
Bicarbonate: 23.1 mmol/L (ref 20.0–28.0)
Bicarbonate: 23.6 mmol/L (ref 20.0–28.0)
Calcium, Ion: 1.2 mmol/L (ref 1.15–1.40)
Calcium, Ion: 1.18 mmol/L (ref 1.15–1.40)
HCT: 42 % (ref 39.0–52.0)
HCT: 34 % — ABNORMAL LOW (ref 39.0–52.0)
Hemoglobin: 14.3 g/dL (ref 13.0–17.0)
Hemoglobin: 11.6 g/dL — ABNORMAL LOW (ref 13.0–17.0)
O2 Saturation: 98 %
O2 Saturation: 100 %
Patient temperature: 98.6
Patient temperature: 100.9
Potassium: 3.9 mmol/L (ref 3.5–5.1)
Potassium: 4.1 mmol/L (ref 3.5–5.1)
Sodium: 162 mmol/L (ref 135–145)
Sodium: 160 mmol/L — ABNORMAL HIGH (ref 135–145)
TCO2: 24 mmol/L (ref 22–32)
TCO2: 25 mmol/L (ref 22–32)
pCO2 arterial: 35.5 mmHg (ref 32.0–48.0)
pCO2 arterial: 47.4 mmHg (ref 32.0–48.0)
pH, Arterial: 7.422 (ref 7.350–7.450)
pH, Arterial: 7.31 — ABNORMAL LOW (ref 7.350–7.450)
pO2, Arterial: 110 mmHg — ABNORMAL HIGH (ref 83.0–108.0)
pO2, Arterial: 209 mmHg — ABNORMAL HIGH (ref 83.0–108.0)

## 2020-09-14 LAB — MAGNESIUM: Magnesium: 2.5 mg/dL — ABNORMAL HIGH (ref 1.7–2.4)

## 2020-09-14 MED ORDER — FUROSEMIDE 10 MG/ML IJ SOLN
40.0000 mg | Freq: Once | INTRAMUSCULAR | Status: AC
  Administered 2020-09-14: 40 mg via INTRAVENOUS
  Filled 2020-09-14: qty 4

## 2020-09-14 NOTE — Consult Note (Signed)
[facility] Consult Note  Patient Care Team: Leanna Battles, MD as PCP - General (Internal Medicine)  CHIEF COMPLAINTS/PURPOSE OF CONSULTATION:  Glioblastoma  HISTORY OF PRESENTING ILLNESS:  TYHEIM METTER 75 y.o. male presented to medical attention in early April with several weeks of memory impairment, change in ability to perform previously learned tasks.  He is an avid musician, and was puzzled by his inability to play his saxophone.  CNS imaging demonstrated a mass in the right temporal lobe, consistent with likely primary brain tumor.  This was resected on 09/01/20 with Dr. Christella Noa.  He unfortunately developed a large hematoma post-operatively, which was eventually resected on 09/07/20, also with Dr. Christella Noa.  Yesterday he was extubated, and is at bedside with his wife, who provided history today.  MEDICAL HISTORY:  Past Medical History:  Diagnosis Date  . Anticoagulant long-term use    eliquis/ asa --- mangaed by cardiology  . Cancer [facility])    prostate  . Coronary artery disease cardiologist--- Truitt Merle PA   s/p cardiac cath 03-14-2019 severe 3V ;  05-27-2019  s/p  CABG x3/ clipping atrial appendage  . Depression   . DOE (dyspnea on exertion)    05-04-2020  per pt is is much better since CABG 01/ 2021,  recovers quickly after going up flight stairs  . Feeling of incomplete bladder emptying   . Full dentures   . Heart murmur   . History of kidney stones 2022   passed  . History of prostate cancer 1998   urologist--- dr Junious Silk---  s/p radioactive seed implants 1998  . History of urethral stricture    s/p  dilatation  . Hypertension   . Incisional hernia    per pt was told by dr gerhardt, had incisional hernia at CABG incision  . Macular degeneration of both eyes   . Nephrolithiasis    right side nonobstructive per CT 05-01-2020,  also had 2 right ureter stones and on 05-04-2020 pt stated he passed 2 stones 05-03-2020  . OSA on CPAP    uses  CPAP-settings 5  . Osteoarthritis (arthritis due to wear and tear of joints)   . Persistent atrial fibrillation [facility])    cardiologist-- Remer Macho PA  . Slow urinary stream   . Type 2 diabetes mellitus ([place])    followed by pcp  (05-04-2020  per pt fasting blood sugar average 98-120)  . 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 Wears hearing aid in both ears     SURGICAL HISTORY: Past Surgical History:  Procedure Laterality Date  . APPLICATION OF CRANIAL NAVIGATION N/A 09/01/2020   Procedure: APPLICATION OF CRANIAL NAVIGATION;  Surgeon: Ashok Pall, MD;  Location: [place];  Service: Neurosurgery;  Laterality: N/A;  . CATARACT EXTRACTION W/ INTRAOCULAR LENS  IMPLANT, BILATERAL  2019  . CLIPPING OF ATRIAL APPENDAGE N/A 05/27/2019   Procedure: CLIPPING OF ATRIAL APPENDAGE - Using AtriCure Clip size 45MM;  Surgeon: Grace Isaac, MD;  Location: [place];  Service: Open Heart Surgery;  Laterality: N/A;  . CORONARY ARTERY BYPASS GRAFT N/A 05/27/2019   Procedure: CORONARY ARTERY BYPASS GRAFTING (CABG) x Three , using left internal mammary artery and right leg greater saphenous vein harvested endoscopically LIMA to LAD, SVG to DIAG, SVG to RCA. Incision of left internal mammary lymph node Incision of Left Internal  Mediastinal Mass;  Surgeon: Grace Isaac, MD;  Location: [place];  Service: Open Heart Surgery;  Laterality: N/A;  . CRANIOTOMY Right 09/01/2020   Procedure: Right Temporal Craniotomy for Tumor with Brainlab;  Surgeon: Ashok Pall, MD;  Location: [place];  Service: Neurosurgery;  Laterality: Right;  . CRANIOTOMY N/A 09/07/2020   Procedure: CRANIOTOMY HEMATOMA EVACUATION SUBDURAL;  Surgeon: Ashok Pall, MD;  Location: [place];  Service: Neurosurgery;  Laterality: N/A;  . CYSTOSCOPY W/ RETROGRADES  04/04/2012   Procedure: CYSTOSCOPY WITH RETROGRADE PYELOGRAM;  Surgeon:  Molli Hazard, MD;  Location: [facility];  Service: Urology;  Laterality: Bilateral;  . CYSTOSCOPY WITH RETROGRADE PYELOGRAM, URETEROSCOPY AND STENT PLACEMENT  04/04/2012   Procedure: CYSTOSCOPY WITH RETROGRADE PYELOGRAM, URETEROSCOPY AND STENT PLACEMENT;  Surgeon: Molli Hazard, MD;  Location: [facility];  Service: Urology;  Laterality: Right;  . EXCISION MASS ABDOMINAL N/A 05/19/2020   Procedure: EXCISION OF SUBCUTANEOUS MASS-ABDOMEN;  Surgeon: Ileana Roup, MD;  Location: [place];  Service: General;  Laterality: N/A;  . LEFT HEART CATH AND CORONARY ANGIOGRAPHY N/A 03/14/2019   Procedure: LEFT HEART CATH AND CORONARY ANGIOGRAPHY;  Surgeon: Belva Crome, MD;  Location: [place] [facility];  Service: Cardiovascular;  Laterality: N/A;  . LUMBAR LAMINECTOMY/DECOMPRESSION MICRODISCECTOMY N/A 09/27/2017   Procedure: LAMINECTOMY LUMBAR THREE- LUMBAR FOUR, LUMBAR FOUR- LUMBAR FIVE;  Surgeon: Ashok Pall, MD;  Location: [place];  Service: Neurosurgery;  Laterality: N/A;  . RADIOACTIVE PROSTATE SEED IMPLANTS  1998  . ROTATOR CUFF REPAIR Left approx. 2010  . TEE WITHOUT CARDIOVERSION N/A 05/27/2019   Procedure: TRANSESOPHAGEAL ECHOCARDIOGRAM (TEE);  Surgeon: Grace Isaac, MD;  Location: [place];  Service: Open Heart Surgery;  Laterality: N/A;  . TOTAL KNEE ARTHROPLASTY Left 08/06/2014   Procedure: LEFT TOTAL KNEE ARTHROPLASTY;  Surgeon: Susa Day, MD;  Location[facility];  Service: Orthopedics;  Laterality: Left;    SOCIAL HISTORY: Social History   Socioeconomic History  . Marital status: Married    Spouse name: Not on file  . Number of children: Not on file  . Years of education: Not on file  . Highest education level: Not on file  Occupational History  . Not on file  Tobacco Use  . Smoking status: Former Smoker    Years: 6.00    Types: Cigarettes    Quit date: 05/15/1973    Years since quitting: 47.3  . Smokeless tobacco: Never Used  Vaping Use  .  Vaping Use: Never used  Substance and Sexual Activity  . Alcohol use: Yes    Comment: "2 cocktails twice a week",stopped 07/23/2020 instructed by PCP  . Drug use: Never  . Sexual activity: Not on file  Other Topics Concern  . Not on file  Social History Narrative  . Not on file   Social Determinants of Health   Financial Resource Strain: Not on file  Food Insecurity: Not on file  Transportation Needs: Not on file  Physical Activity: Not on file  Stress: Not on file  Social Connections: Not on file  Intimate Partner Violence: Not on file    FAMILY HISTORY: Family History  Problem Relation Age of Onset  . Cancer Mother   . Dementia Mother   . Colon cancer Mother   . Heart attack Sister   . Stroke Sister   . Heart attack Father   . Esophageal cancer Neg Hx   . Rectal cancer Neg Hx   . Stomach cancer Neg Hx  ALLERGIES:  is allergic to lisinopril, other, sulfa drugs cross reactors, contrast media [iodinated diagnostic agents], doxycycline, ioxaglate, and tetracyclines & related.  MEDICATIONS:  Current Facility-Administered Medications  Medication Dose Route Frequency Provider Last Rate Last Admin  . 0.9 %  sodium chloride infusion   Intravenous PRN Ashok Pall, MD 10 mL/hr at 09/14/20 1600 Infusion Verify at 09/14/20 1600  . 0.9 %  sodium chloride infusion  250 mL Intravenous Continuous Ashok Pall, MD      . acetaminophen (TYLENOL) tablet 650 mg  650 mg Per Tube Q4H PRN Ashok Pall, MD   650 mg at 09/10/20 1547   Or  . acetaminophen (TYLENOL) suppository 650 mg  650 mg Rectal Q4H PRN Ashok Pall, MD      . chlorhexidine (PERIDEX) 0.12 % solution 15 mL  15 mL Mouth Rinse BID Dawley, Troy C, DO   15 mL at 09/14/20 0950  . Chlorhexidine Gluconate Cloth 2 % PADS 6 each  6 each Topical Daily Ashok Pall, MD   6 each at 09/13/20 2000  . dexamethasone (DECADRON) injection 4 mg  4 mg Intravenous BID Ashok Pall, MD   4 mg at 09/14/20 0953  . feeding supplement  (PIVOT 1.5 CAL) liquid 1,000 mL  1,000 mL Per Tube Continuous Ashok Pall, MD 65 mL/hr at 09/14/20 1055 1,000 mL at 09/14/20 1055  . fentaNYL (SUBLIMAZE) injection 25-50 mcg  25-50 mcg Intravenous Q4H PRN Estill Cotta, NP   25 mcg at 09/13/20 [id number]  . free water 200 mL  200 mL Per Tube Q8H Ashok Pall, MD   200 mL at 09/14/20 1400  . insulin aspart (novoLOG) injection 0-20 Units  0-20 Units Subcutaneous Q4H Desai, Rahul P, PA-C   7 Units at 09/14/20 1600  . insulin aspart (novoLOG) injection 12 Units  12 Units Subcutaneous Q4H Collene Gobble, MD   12 Units at 09/14/20 1600  . labetalol (NORMODYNE) injection 10-40 mg  10-40 mg Intravenous Q10 min PRN Ashok Pall, MD   20 mg at 09/07/20 0829  . levETIRAcetam (KEPPRA) 100 MG/ML solution 500 mg  500 mg Per Tube BID Shearon Stalls, Rahul P, PA-C   500 mg at 09/14/20 0950  . MEDLINE mouth rinse  15 mL Mouth Rinse q12n4p Dawley, Troy C, DO   15 mL at 09/14/20 1601  . metoprolol tartrate (LOPRESSOR) tablet 25 mg  25 mg Per Tube BID Kipp Brood, MD   25 mg at 09/14/20 0951  . multivitamin with minerals tablet 1 tablet  1 tablet Per Tube Daily Kipp Brood, MD   1 tablet at 09/14/20 0951  . ondansetron (ZOFRAN) tablet 4 mg  4 mg Per Tube Q4H PRN Ashok Pall, MD       Or  . ondansetron (ZOFRAN) injection 4 mg  4 mg Intravenous Q4H PRN Ashok Pall, MD      . pantoprazole sodium (PROTONIX) 40 mg/20 mL oral suspension 40 mg  40 mg Per Tube Daily Desai, Rahul P, PA-C   40 mg at 09/14/20 0950  . senna-docusate (Senokot-S) tablet 1 tablet  1 tablet Per Tube BID PRN Kipp Brood, MD   1 tablet at 09/09/20 1016  . senna-docusate (Senokot-S) tablet 1 tablet  1 tablet Per Tube BID Juanito Doom, MD   1 tablet at 09/14/20 0957    REVIEW OF SYSTEMS:   Limited by mentation  PHYSICAL EXAMINATION: Vitals:   09/14/20 1500 09/14/20 1600  BP: 118/73 110/75  Pulse: 89 89  Resp: Marland Kitchen)  21 (!) 24  Temp:  97.7 F (36.5 C)  SpO2: 96% 96%   KPS:  50. General: Minimally conscious Head: Craniotomy scar noted, dry and intact. EENT: Dobhoff tube Lungs: Resp effort normal Cardiac: Regular rate and rhythm Abdomen: Soft, non-distended abdomen Skin: No rashes cyanosis or petechiae. Extremities: No clubbing or edema  NEUROLOGIC EXAM: Mental Status: Not awake, minimally conscious.  Responds purposefully to painful stimulation.   Cranial Nerves: Pupils, gag, eye mov'ts intact. Motor: Plegic on left, withdraws right purposefully Sensory: Responds to stimuli on right Gait: Deferred   LABORATORY DATA:  I have reviewed the data as listed Lab Results  Component Value Date   WBC 13.9 (H) 09/14/2020   HGB 11.6 (L) 09/14/2020   HCT 34.8 (L) 09/14/2020   MCV 92.3 09/14/2020   PLT 135 (L) 09/14/2020   Recent Labs    09/30/19 1107 10/14/19 0820 02/04/20 1109 02/04/20 1109 05/01/20 1141 05/19/20 1015 07/01/20 1103 07/13/20 1427 09/01/20 1326 09/04/20 0956 09/11/20 0314 09/11/20 1711 09/12/20 0159 09/13/20 0525 09/14/20 0116  NA 140 141 141  --  139   < > 140   < > 138   < > 148*   < > 145 147* 144  K 4.7 4.4 4.1  --  4.3   < > 4.6   < > 4.2   < > 4.4  --  4.4 4.2 4.1  CL 103 104 102  --  107   < > 104   < > 106   < > 112*  --  112* 109 110  CO2 24 22 24   --  23  --  19*   < > 24   < > 25  --  25 27 26   GLUCOSE 107* 164* 93  --  128*   < > 120*   < > 112*   < > 212*  --  236* 242* 225*  BUN 18 22 20   --  26*   < > 24   < > 21   < > 63*  --  61* 69* 62*  CREATININE 1.18 1.21 1.11  --  1.36*   < > 1.31*   < > 1.31*   < > 1.42*  --  1.27* 1.37* 1.23  CALCIUM 9.3 9.5 8.7  --  9.2  --  9.6   < > 9.0   < > 8.6*  --  8.7* 8.6* 8.6*  GFRNONAA 60 58* 64   < > 54*  --  53*  --  56*   < > 51*  --  59* 53* >60  GFRAA 69 67 74  --   --   --  61  --   --   --   --   --   --   --   --   PROT 6.6  --   --   --  7.4  --   --   --  6.3*  --  5.2*  --   --   --   --   ALBUMIN 4.5  --   --   --  4.8  --   --   --  3.7  --  2.5*  --   --   --   --    AST 34  --   --   --  35  --   --   --  26  --  21  --   --   --   --  ALT 42  --   --   --  39  --   --   --  29  --  23  --   --   --   --   ALKPHOS 120  --   --   --  110  --   --   --  70  --  53  --   --   --   --   BILITOT 1.4*  --   --   --  1.2  --   --   --  1.2  --  0.6  --   --   --   --   BILIDIR 0.49*  --   --   --   --   --   --   --   --   --   --   --   --   --   --    < > = values in this interval not displayed.    RADIOGRAPHIC STUDIES: I have personally reviewed the radiological images as listed and agreed with the findings in the report. CT HEAD WO CONTRAST  Result Date: 09/08/2020 CLINICAL DATA:  Postop craniotomy for hematoma evacuation EXAM: CT HEAD WITHOUT CONTRAST TECHNIQUE: Contiguous axial images were obtained from the base of the skull through the vertex without intravenous contrast. COMPARISON:  09/03/2020 FINDINGS: Brain: Interval right-sided craniotomy for parenchymal hematoma evacuation. Majority of the hematoma in the right temporoparietal lobe has been removed. There is gas in the surgical cavity with residual blood. Surrounding low-density edema is present around the hematoma. Improvement in mass-effect and midline shift. Midline shift now 7 mm. Mild subarachnoid hemorrhage. Negative for hydrocephalus. There is pneumocephalus bilaterally. There is gas in the right frontal horn. Layering blood is seen in the occipital horns bilaterally. Generalized atrophy. Extensive diffuse white matter hypodensity consistent with chronic microvascular ischemia. Vascular: Negative for hyperdense vessel Skull: Right-sided craniotomy flap in good position. Sinuses/Orbits: Mucosal edema left maxillary sinus. NG tube in place. Bilateral cataract extraction. Other: None IMPRESSION: Interval surgical evacuation of large right sided hematoma. Majority of the blood has been removed. There is improvement in mass-effect and midline shift now 7 mm to the left. Postop pneumocephalus in the  frontal lobes bilaterally. Mild amount of intraventricular air and blood remains in the ventricles. Extensive white matter hypodensity bilaterally most consistent with chronic microvascular ischemia which was present on preoperative studies. Electronically Signed   By: Franchot Gallo M.D.   On: 09/08/2020 10:26   CT HEAD WO CONTRAST  Result Date: 09/03/2020 CLINICAL DATA:  Follow-up known cerebral hemorrhage EXAM: CT HEAD WITHOUT CONTRAST TECHNIQUE: Contiguous axial images were obtained from the base of the skull through the vertex without intravenous contrast. COMPARISON:  Head CT September 02, 2020 FINDINGS: Brain: Increased size of the large intraparenchymal hemorrhage centered in the right parietal temporal region which measures up to 6.2 x 6.1 x 5.6 cm previously 5.8 x 5.8 x 5.6 cm. Similar small volume overlying subarachnoid hemorrhage now with subarachnoid hemorrhage overlying the left occipital and posterior temporal lobes. Similar small amount of intraventricular hemorrhage within the right lateral ventricle and layering in the left occipital horn. Increased leftward shift now measuring 9 mm at the foramen of [place] previously 7 mm, with increased effacement of the third ventricle. Slightly increased early subfalcine herniation with similar suggestion of early uncal herniation. Surgical material and pneumocephalus subjacent to the right-sided craniotomy site again visualized. Vascular: Calcified atherosclerosis. Skull: Right-sided craniotomy.  Sinuses/Orbits: Left maxillary sinus mucous retention cyst. Otherwise the paranasal sinuses are predominantly clear. Orbits are grossly unremarkable. Other: Partially visualized nasoenteric tube. IMPRESSION: 1. Increased size of the large intraparenchymal hemorrhage centered in the right parietotemporal region with increased surrounding edema and mass effect including increased leftward shift and effacement of the third ventricle. As well as slightly increased early  subfalcine herniation with similar early uncal herniation. 2. Similar small volume of overlying subarachnoid hemorrhage, now with subarachnoid hemorrhage also located over the left occipital and posterior temporal lobes. 3. Similar small amount of intraventricular hemorrhage. These results were called by telephone at the time of interpretation on 09/03/2020 at 4:34 pm to provider Emelda Brothers, MD , who verbally acknowledged these results. Electronically Signed   By: Dahlia Bailiff MD   On: 09/03/2020 16:35   CT HEAD WO CONTRAST  Result Date: 09/02/2020 CLINICAL DATA:  Craniotomy, postop EXAM: CT HEAD WITHOUT CONTRAST TECHNIQUE: Contiguous axial images were obtained from the base of the skull through the vertex without intravenous contrast. COMPARISON:  Same day CT head. FINDINGS: Brain: Interval increase in size of a large intraparenchymal hemorrhage centered in the right parietotemporal region, which measures up to 5.6 x 5.8 x 5.8 cm (approximately 94 cm^3). Small volume overlying subarachnoid hemorrhage. Increased small amount of intraventricular hemorrhage within the adjacent right lateral ventricle and layering in the left occipital horn. Surrounding edema and mass effect. Effacement of the right lateral ventricle without specific evidence of hydrocephalus. Leftward midline shift is similar, measuring 7 mm at the foramen of [place]. Similar pneumocephalus and surgical material subjacent to the right-sided craniotomy. No evidence of large vascular territory acute infarct. Vascular: Calcific atherosclerosis. Skull: Right-sided craniotomy. Sinuses/Orbits: Left maxillary sinus retention cyst. Otherwise, clear sinuses. Unremarkable orbits. Other: No mastoid effusions. IMPRESSION: Interval increase in size of a large 5.8 cm intraparenchymal hemorrhage centered in the right parietotemporal region, as described above. Small volume overlying subarachnoid hemorrhage and increased small volume of intraventricular  extension of hemorrhage in the adjacent right lateral ventricle and layering in the left occipital horn. Increased surrounding edema and mass effect with similar 7 mm of leftward midline shift. These results will be called to the ordering clinician or representative by the Radiologist Assistant, and communication documented in the PACS or [employer]. Electronically Signed   By: Margaretha Sheffield MD   On: 09/02/2020 08:55   CT HEAD WO CONTRAST  Result Date: 09/02/2020 CLINICAL DATA:  Recent tumor resection.  Left-sided weakness. EXAM: CT HEAD WITHOUT CONTRAST TECHNIQUE: Contiguous axial images were obtained from the base of the skull through the vertex without intravenous contrast. COMPARISON:  None. FINDINGS: Brain: Intraparenchymal hematoma centered in the right parietal/temporal lobe measures 4.1 x 6.3 x 5.0 cm (volume = 68 cm^3). There is leftward midline shift measuring 7 mm. Small volume pneumocephalus. There is periventricular hypoattenuation compatible with chronic microvascular disease. Vascular: No hyperdense vessel or unexpected calcification. Skull: Recent right-sided craniotomy. Sinuses/Orbits: Left maxillary retention cyst. Other: None IMPRESSION: 1. Large intraparenchymal hematoma centered in the right parietal/temporal lobe with 7 mm leftward midline shift. 2. Small volume pneumocephalus. 3. Critical Value/emergent results were called by telephone at the time of interpretation on 09/02/2020 at 12:39 am to provider TROY DAWLEY , who verbally acknowledged these results. Electronically Signed   By: Ulyses Jarred M.D.   On: 09/02/2020 00:40   CT HEAD W & WO CONTRAST  Result Date: 08/27/2020 CLINICAL DATA:  Brain tumor EXAM: CT HEAD WITHOUT AND WITH CONTRAST TECHNIQUE: Contiguous axial  images were obtained from the base of the skull through the vertex without and with intravenous contrast CONTRAST:  23mL OMNIPAQUE IOHEXOL 300 MG/ML  SOLN COMPARISON:  MRI brain with contrast 08/19/2020  FINDINGS: Brain: Enhancing mass lesion in the right superior temporal gyrus appears unchanged. There is irregular peripheral enhancement with central necrosis. The enhancing mass measures approximately 28 x 34 mm. There is a moderate amount of surrounding white matter hypodensity. No second lesion identified Extensive white matter hypodensities seen throughout both cerebral hemispheres as noted on recent MRI. Ventricle size normal. No midline shift. Negative for acute hemorrhage. Vascular: Normal arterial flow voids. Skull: Negative Sinuses/Orbits: Mild mucosal edema left maxillary sinus. Bilateral cataract extraction Other: None IMPRESSION: Enhancing mass lesion right superior temporal lobe unchanged. The mass has peripheral enhancement and central necrosis. There is a moderate amount of surrounding white matter hypodensity. 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 Extensive white matter changes diffusely appears chronic and unchanged from prior MRI. Correlate with risk factors for small vessel ischemia. Electronically Signed   By: Franchot Gallo M.D.   On: 08/27/2020 16:15   MR BRAIN W CONTRAST  Result Date: 08/19/2020 CLINICAL DATA:  Follow-up examination for intracranial mass. EXAM: MRI HEAD WITH CONTRAST TECHNIQUE: Multiplanar, multiecho pulse sequences of the brain and surrounding structures were obtained with intravenous contrast. CONTRAST:  74mL GADAVIST GADOBUTROL 1 MMOL/ML IV SOLN COMPARISON:  Recent brain MRI from 08/13/2020. FINDINGS: Brain: Previously identified mass positioned at the superior right temporal gyrus again seen. Lesion demonstrates avid heterogeneous predominant peripheral postcontrast enhancement. Lesion measures 3.3 x 3.3 x 3.2 cm on today's exam (AP by transverse by craniocaudad). Surrounding vasogenic edema and regional mass effect stable from previous. No midline shift. Basilar cisterns remain patent. No other mass lesion or abnormal enhancement seen elsewhere within the  brain. Underlying atrophy with cerebral white matter disease again noted. Vascular: Normal intravascular enhancement seen throughout the brain. Skull and upper cervical spine: Craniocervical junction within normal limits. Upper cervical spine normal. Bone marrow signal intensity within normal limits. No focal marrow replacing lesion. Scalp soft tissues within normal limits. Sinuses/Orbits: Globes and orbital soft tissues demonstrate no acute finding. Paranasal sinuses remain largely clear. Other: None. IMPRESSION: 1. 3.3 x 3.3 x 3.2 cm enhancing mass positioned at the superior right temporal gyrus, most concerning for a primary CNS neoplasm/high-grade GBM. A solitary intracranial metastasis would be the primary differential consideration, although is felt to be less likely. 2. No other intracranial mass lesion or abnormal enhancement. Electronically Signed   By: Jeannine Boga M.D.   On: 08/19/2020 17:06   DG Chest Port 1 View  Result Date: 09/13/2020 CLINICAL DATA:  Hypoxia EXAM: PORTABLE CHEST 1 VIEW COMPARISON:  Sep 12, 2020 FINDINGS: Endotracheal tube tip is 4.5 cm above the carina. Enteric tube tip is below the diaphragm. No pneumothorax. There is slight bibasilar atelectasis. Lungs otherwise clear. Heart size and pulmonary vascularity normal. There is aortic atherosclerosis. Patient is status post coronary artery bypass grafting. There is a left atrial appendage clamp. No adenopathy. No bone lesions. IMPRESSION: Tube positions as described without pneumothorax. Mild bibasilar atelectasis. Lungs elsewhere clear. Stable cardiac silhouette. Postoperative changes noted. Aortic Atherosclerosis (ICD10-I70.0). Electronically Signed   By: Lowella Grip III M.D.   On: 09/13/2020 08:21   DG Chest Port 1 View  Result Date: 09/12/2020 CLINICAL DATA:  Acute on chronic respiratory failure with hypoxia EXAM: PORTABLE CHEST 1 VIEW COMPARISON:  Radiograph 09/07/2020 FINDINGS: Endotracheal tube tip terminates in  the mid trachea, 5.4 cm from the carina. Transesophageal tube tip terminates beyond the GE junction, below the margins of imaging. Telemetry leads overlie the chest. Prior sternotomy and CABG with clipping of the left atrial appendage as well. Stable cardiomediastinal contours with a calcified aorta. Some streaky opacities in the lung bases likely reflect combination of scarring and atelectatic changes. No new consolidative process or convincing features of edema. Pulmonary vascularity is normally distributed. No new layering effusion or pneumothorax. No acute osseous or soft tissue abnormality. IMPRESSION: Lines and tubes as above. Low volumes and streaky basilar opacities favoring a combination of atelectasis and scarring. Prior sternotomy and CABG with left atrial appendage occlusion. Aortic Atherosclerosis (ICD10-I70.0). Electronically Signed   By: Lovena Le M.D.   On: 09/12/2020 06:50   DG CHEST PORT 1 VIEW  Result Date: 09/07/2020 CLINICAL DATA:  Endotracheal tube placement. EXAM: PORTABLE CHEST 1 VIEW COMPARISON:  Same day. FINDINGS: The heart size and mediastinal contours are within normal limits. Endotracheal tube is in grossly good position. Feeding tube is seen entering stomach. Status post coronary bypass graft. Lungs are clear. The visualized skeletal structures are unremarkable. IMPRESSION: Endotracheal tube in grossly good position. No acute abnormality seen. Electronically Signed   By: Marijo Conception M.D.   On: 09/07/2020 17:58   DG CHEST PORT 1 VIEW  Result Date: 09/07/2020 CLINICAL DATA:  Hypoxia EXAM: PORTABLE CHEST 1 VIEW COMPARISON:  09/04/2020 FINDINGS: Lungs are clear. No pneumothorax or pleural effusion. Nasoenteric feeding tube extends into the upper abdomen. Cardiac size is mildly enlarged. Coronary artery bypass grafting and left atrial clipping has been performed. Pulmonary vascularity is normal. No acute bone abnormality. IMPRESSION: No active disease.  Stable cardiomegaly.  Electronically Signed   By: Fidela Salisbury MD   On: 09/07/2020 05:33   DG CHEST PORT 1 VIEW  Result Date: 09/04/2020 CLINICAL DATA:  Lethargy.  Coronary artery disease. EXAM: PORTABLE CHEST 1 VIEW COMPARISON:  07/14/2019 FINDINGS: Stable mild cardiomegaly. Aortic atherosclerotic calcification noted. Prior CABG noted. A feeding tube is seen entering the stomach, however the distal tip is not within the field of view. Both lungs are clear. IMPRESSION: Stable mild cardiomegaly. No active lung disease. Electronically Signed   By: Marlaine Hind M.D.   On: 09/04/2020 08:17    ASSESSMENT & PLAN:  Glioblastoma  ABHIJIT NOLTING is in state today consistent with encephalopathy, secondary to intraparenchymal hematoma and subsequent edema, two craniotomies,  ICU sedation, OR anesthesia, residual blood products.  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   We had an extensive goals of care conversation at bedside with his wife.  This is a difficult time to make big picture decisions.  We would recommend continued close monitoring for clinical improvement, the degree of which is uncertain at this time.  If he improves and regains alertness, language, motor function, we can certainly consider repeating an MRI and stratifying him for some radiation and/or chemotherapy.  This could even be started while in rehabilitation, if amenable.  We would also encourage palliative care consult, as comfort measures could also be considered over the coming days given the high grade histology, current clinical state.   We appreciate the consult and will continue to follow along.  All questions were answered. The patient knows to call the clinic with any problems, questions or concerns.  The total time spent in the encounter was 55 minutes and more  than 50% was on counseling and review of test results     Ventura Sellers, MD 09/14/2020 5:06 PM

## 2020-09-14 NOTE — Evaluation (Signed)
Physical Therapy Evaluation Patient Details Name: [name] MRN: [id number] DOB: [birth year] Today's Date: 09/14/2020   History of Present Illness  75 yo male presented with memory loss and dysarthria. 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 Evacuation of SDH 4/27. bone flap in place Intubated 4/26-5/2.  PMH CAD depression kidney stones prostate CA HTN Macular degeneration OSA on CPAP, Afib DM2 HOH with hearing aides CABG CAD  Clinical Impression  Prior to admission, pt lives with his spouse and is independent. Pt presents with decreased arousal, cognition, left hemiparesis, and poor balance. Pt requiring two person total assist for bed mobility. Pt localizes to loose stool with RUE during session. PT/OT rolled to right and left to provide peri care and linen change. Pt opening eyes, mouthing once during session, not following commands. See below for recommendations.     Follow Up Recommendations SNF    Equipment Recommendations  Wheelchair (measurements PT);Wheelchair cushion (measurements PT);Hospital bed;Other (comment) (hoyer lift)    Recommendations for Other Services       Precautions / Restrictions Precautions Precautions: Fall Precaution Comments: hearing aides, top/bottom dentures, NGT Restrictions Weight Bearing Restrictions: No      Mobility  Bed Mobility Overal bed mobility: Needs Assistance Bed Mobility: Rolling Rolling: +2 for physical assistance;Total assist         General bed mobility comments: pt positioned for progression to EOB and then pt voiding bowels. Pt placing R hand in stool Pt returned to supine and hygiene provided. pt requires multiple log rolls R and L to complete task. pt positioned in bed with HOB elevated and pillows for midline. Pt with L UE elevated for edema management. Recommendations for L LE pravalon for skin integrity    Transfers                  General transfer comment: defer to next session when pt could demonstrate more arousal  Ambulation/Gait                Stairs            Wheelchair Mobility    Modified Rankin (Stroke Patients Only)       Balance Overall balance assessment: Needs assistance     Sitting balance - Comments: pt noted to have LOB toward the L with long sitting in the bed. pt requires pillows for midline alignment                                     Pertinent Vitals/Pain Pain Assessment: Faces Faces Pain Scale: No hurt    Home Living Family/patient expects to be discharged to:: Private residence Living Arrangements: Spouse/significant other Available Help at Discharge: Family Type of Home: House Home Access: Stairs to enter Entrance Stairs-Rails: None Entrance Stairs-Number of Steps: 2 Home Layout: Two level;Able to live on main level with bedroom/bathroom (basemient) Home Equipment: Walker - 2 wheels Additional Comments: has 3 rescue cats, loves to build things including furniture, has 2 acres that he is always maintaining. Plays in a band ( saxophone). enjoys classic rock. 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    Prior Function Level of Independence: Independent         Comments: driving and retired life     Hand Dominance   Dominant Hand: Left    Extremity/Trunk Assessment   Upper Extremity Assessment  Upper Extremity Assessment: LUE deficits/detail LUE Deficits / Details: flaccid no response to painful stimuli LUE Sensation: decreased light touch;decreased proprioception LUE Coordination: decreased fine motor;decreased gross motor    Lower Extremity Assessment Lower Extremity Assessment: LLE deficits/detail LLE Deficits / Details: flaccid LLE Sensation: decreased light touch;decreased proprioception LLE Coordination: decreased fine motor;decreased gross motor    Cervical / Trunk Assessment Cervical / Trunk Assessment: Other  exceptions Cervical / Trunk Exceptions: R neck rotation preference  Communication   Communication: HOH (wears hearing aides)  Cognition Arousal/Alertness: Lethargic Behavior During Therapy: Flat affect Overall Cognitive Status: Difficult to assess                                 General Comments: pt mouth once during session, pt localizes to void of bowels with R hand. pt not following commands during session. pt does initiate bed mobility after multiple log roll attempts with L UE      General Comments General comments (skin integrity, edema, etc.): high risk for skin break down due to loose stool. Barrier cream applied. Recommendation for frequent checks and repositioning every 2 hrs    Exercises     Assessment/Plan    PT Assessment Patient needs continued PT services  PT Problem List Decreased strength;Decreased range of motion;Decreased balance;Decreased mobility;Decreased cognition;Decreased safety awareness;Impaired sensation       PT Treatment Interventions Functional mobility training;Therapeutic activities;Balance training;Therapeutic exercise;Cognitive remediation;Patient/family education    PT Goals (Current goals can be found in the Care Plan section)  Acute Rehab PT Goals Patient Stated Goal: none stated PT Goal Formulation: With patient/family Time For Goal Achievement: 09/28/20 Potential to Achieve Goals: Fair    Frequency Min 3X/week   Barriers to discharge        Co-evaluation PT/OT/SLP Co-Evaluation/Treatment: Yes Reason for Co-Treatment: Complexity of the patient's impairments (multi-system involvement);Necessary to address cognition/behavior during functional activity;For patient/therapist safety;To address functional/ADL transfers PT goals addressed during session: Mobility/safety with mobility OT goals addressed during session: ADL's and self-care;Proper use of Adaptive equipment and DME;Strengthening/ROM       AM-PAC PT "6 Clicks"  Mobility  Outcome Measure Help needed turning from your back to your side while in a flat bed without using bedrails?: Total Help needed moving from lying on your back to sitting on the side of a flat bed without using bedrails?: Total Help needed moving to and from a bed to a chair (including a wheelchair)?: Total Help needed standing up from a chair using your arms (e.g., wheelchair or bedside chair)?: Total Help needed to walk in hospital room?: Total Help needed climbing 3-5 steps with a railing? : Total 6 Click Score: 6    End of Session   Activity Tolerance: Other (comment) (no change in vital signs) Patient left: in bed;with call bell/phone within reach;with SCD's reapplied Nurse Communication: Mobility status PT Visit Diagnosis: Other abnormalities of gait and mobility (R26.89);Hemiplegia and hemiparesis Hemiplegia - Right/Left: Left Hemiplegia - dominant/non-dominant: Dominant Hemiplegia - caused by: Nontraumatic intracerebral hemorrhage    Time: 1212-1233 PT Time Calculation (min) (ACUTE ONLY): 21 min   Charges:   PT Evaluation $PT Eval Moderate Complexity: 1 Mod          Wyona Almas, PT, DPT Acute Rehabilitation Services Pager [phone] Office [place] 09/14/2020, 2:25 PM

## 2020-09-14 NOTE — Evaluation (Addendum)
Occupational Therapy Evaluation Patient Details Name: [name] MRN: [id number] DOB: 07-07-1945 Today's Date: 09/14/2020    History of Present Illness 75 yo male presented with memory loss and dysarthria. 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 Evacuation of SDH 4/27. bone flap in place Intubated 4/26-5/2.  PMH CAD depression kidney stones prostate CA HTN Macular degeneration OSA on CPAP, Afib DM2 HOH with hearing aides CABG CAD   Clinical Impression   Patient is s/p Craniotomy surgery secondary to SDH resulting in functional limitations due to the deficits listed below (see OT problem list). Pt baseline independent with all adls and iadls. Pt currently total +2 total (A) for all adls and bed mobility. Pt does initiate movement of R Ue with bed mobility log rolling. Pt does localize to loose stool with R UE during session. Pt reaching with R UE toward cortrak a few times. Pt opening eyes and mouthing once during session but then does not engage in responses. Pt with no movement on L UE / LE during session.  Patient will benefit from skilled OT acutely to increase independence and safety with ADLS to allow discharge SNF.     Follow Up Recommendations  SNF    Equipment Recommendations  Wheelchair (measurements OT);Wheelchair cushion (measurements OT);Hospital bed;Other (comment) (lift)    Recommendations for Other Services Speech consult;Other (comment) (palliative)     Precautions / Restrictions Precautions Precautions: Fall Precaution Comments: hearing aides, top/bottom dentures, foley      Mobility Bed Mobility Overal bed mobility: Needs Assistance Bed Mobility: Rolling Rolling: +2 for physical assistance;Total assist         General bed mobility comments: pt positioned for progression to EOB and then pt voiding bowels. Pt placing R hand in stool Pt returned to supine and hygiene provided. pt  requires multiple log rolls R and L to complete task. pt positioned in bed with HOB elevated and pillows for midline. Pt with L UE elevated for edema management. Recommendations for L LE pravalon for skin integrity    Transfers                 General transfer comment: defer to next session when pt could demonstrate more arousal    Balance Overall balance assessment: Needs assistance     Sitting balance - Comments: pt noted to have LOB toward the R with long sitting in the bed. pt requires pillows for midline alignment                                   ADL either performed or assessed with clinical judgement   ADL Overall ADL's : Needs assistance/impaired Eating/Feeding: NPO                                     General ADL Comments: total (A) for all care     Vision Baseline Vision/History: Wears glasses Wears Glasses: Reading only Vision Assessment?: Yes Eye Alignment: Within Functional Limits Tracking/Visual Pursuits: Impaired - to be further tested in functional context Additional Comments: R gaze preference noted     Perception     Praxis      Pertinent Vitals/Pain Pain Assessment: No/denies pain Faces Pain Scale: No hurt     Hand Dominance Left   Extremity/Trunk Assessment Upper Extremity  Assessment Upper Extremity Assessment: LUE deficits/detail LUE Deficits / Details: flaccid no response to painful stimuli LUE Sensation: decreased light touch;decreased proprioception LUE Coordination: decreased fine motor;decreased gross motor   Lower Extremity Assessment Lower Extremity Assessment: Defer to PT evaluation;LLE deficits/detail LLE Deficits / Details: flaccid LLE Sensation: decreased light touch;decreased proprioception LLE Coordination: decreased fine motor;decreased gross motor   Cervical / Trunk Assessment Cervical / Trunk Assessment: Other exceptions Cervical / Trunk Exceptions: R neck rotation preference    Communication Communication Communication: HOH (wears hearing aides)   Cognition Arousal/Alertness: Lethargic Behavior During Therapy: Flat affect Overall Cognitive Status: Difficult to assess                                 General Comments: pt mouth once during session, pt localizes to void of bowels with R hand. pt not following commands during session. pt does initiate bed mobility after multiple log roll attempts with L UE   General Comments  high risk for skin break down due to loose stool. Barrier cream applied. Recommendation for frequent checks and repositioning every 2 hrs    Exercises     Shoulder Instructions      Home Living Family/patient expects to be discharged to:: Private residence Living Arrangements: Spouse/significant other Available Help at Discharge: Family Type of Home: House Home Access: Stairs to enter [employer] of Steps: 2 Entrance Stairs-Rails: None Home Layout: Two level;Able to live on main level with bedroom/bathroom (basemient)     Bathroom Shower/Tub: Teacher, early years/pre: Handicapped height (only in Surveyor, quantity)     Home Equipment: Environmental consultant - 2 wheels   Additional Comments: has 3 rescue cats, loves to build things including furniture, has 2 acres that he is always maintaining. Plays in a band ( saxophone). enjoys classic rock. 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  Lives With: Spouse    Prior Functioning/Environment Level of Independence: Independent        Comments: driving and retired life        OT Problem List: Decreased strength;Decreased range of motion;Decreased activity tolerance;Impaired balance (sitting and/or standing);Impaired vision/perception;Decreased coordination;Decreased cognition;Decreased safety awareness;Decreased knowledge of use of DME or AE;Decreased knowledge of precautions;Cardiopulmonary status limiting activity;Impaired sensation;Impaired UE functional use       OT Treatment/Interventions: Self-care/ADL training;Therapeutic exercise;Neuromuscular education;Energy conservation;DME and/or AE instruction;Manual therapy;Modalities;Therapeutic activities;Cognitive remediation/compensation;Visual/perceptual remediation/compensation;Patient/family education;Balance training    OT Goals(Current goals can be found in the care plan section) Acute Rehab OT Goals Patient Stated Goal: none stated OT Goal Formulation: Patient unable to participate in goal setting Time For Goal Achievement: 09/27/20 Potential to Achieve Goals: Good  OT Frequency: Min 2X/week   Barriers to D/C:            Co-evaluation PT/OT/SLP Co-Evaluation/Treatment: Yes Reason for Co-Treatment: Complexity of the patient's impairments (multi-system involvement);Necessary to address cognition/behavior during functional activity;For patient/therapist safety;To address functional/ADL transfers   OT goals addressed during session: ADL's and self-care;Proper use of Adaptive equipment and DME;Strengthening/ROM      AM-PAC OT "6 Clicks" Daily Activity     Outcome Measure Help from another person eating meals?: Total Help from another person taking care of personal grooming?: Total Help from another person toileting, which includes using toliet, bedpan, or urinal?: Total Help from another person bathing (including washing, rinsing, drying)?: Total Help from another person to put on and taking off regular upper body clothing?: Total Help from another person  to put on and taking off regular lower body clothing?: Total 6 Click Score: 6   End of Session Nurse Communication: Mobility status;Precautions  Activity Tolerance: Patient tolerated treatment well Patient left: in bed;with call bell/phone within reach;with bed alarm set;with SCD's reapplied  OT Visit Diagnosis: Unsteadiness on feet (R26.81);Muscle weakness (generalized) (M62.81);Hemiplegia and hemiparesis Hemiplegia - Right/Left:  Left Hemiplegia - dominant/non-dominant: Dominant Hemiplegia - caused by: Nontraumatic SAH                Time: 1212-1237 OT Time Calculation (min): 25 min Charges:  OT General Charges $OT Visit: 1 Visit OT Evaluation $OT Eval Moderate Complexity: 1 Mod   Brynn, OTR/L  Acute Rehabilitation Services Pager: [phone] Office: [phone] .   Jeri Modena 09/14/2020, 1:56 PM

## 2020-09-14 NOTE — Progress Notes (Signed)
Nutrition Follow-up  DOCUMENTATION CODES:   Not applicable  INTERVENTION:   Tube feeding via Cortrak: Pivot 1.5 at 65 ml/h (1560 ml per day)  Provides 2340 kcal, 146 gm protein, 1184 ml free water daily  200 ml free water every 8 hours Total free water: 1784 ml   NUTRITION DIAGNOSIS:   Increased nutrient needs related to post-op healing as evidenced by estimated needs. Ongoing.   GOAL:   Patient will meet greater than or equal to 90% of their needs Met with TF.   MONITOR:   TF tolerance  REASON FOR ASSESSMENT:   Consult Enteral/tube feeding initiation and management  ASSESSMENT:   Pt with PMH of HTN, depression, OA, OSA on CPAP, DM, CAD s/p CABG x 3 05/2019, incisional hernia admitted for R temporal lobe lesion plan for resection of mass.   Pt discussed during ICU rounds and with RN.  Pt extubated yesterday, continues to work with SLP but not yet ready for PO diet. Weight has remained stable. Per MD plan to consult neuro-oncologist. Pt continues to demonstrate left-sided neglect.   4/20 s/p R craniotomy for tumor  4/21 pt with new L sided weakness and lethargy and R gaze preference, stat CT shows new large hemorrhage deep in resection cavity with midline shift 4/22 Cortrak placed; tip gastric 4/26 s/p craniotomy for hematoma evacuation; returned intubated 5/2 extubated   Medications reviewed and include: decadron, SSI, novolog, MVI with minerals, protonix, miralax   Labs reviewed:  CBG's: [id number]      Diet Order:   Diet Order            Diet NPO time specified  Diet effective now                 EDUCATION NEEDS:   No education needs have been identified at this time  Skin:  Skin Assessment: Reviewed RN Assessment  Last BM:  5/2  Height:   Ht Readings from Last 1 Encounters:  09/07/20 6' (1.829 m)    Weight:   Wt Readings from Last 1 Encounters:  09/13/20 95.8 kg    Ideal Body Weight:  80.9 kg  BMI:  Body mass index is 28.64  kg/m.  Estimated Nutritional Needs:   Kcal:  2200-2400  Protein:  120-140 grams  Fluid:  >2 L/day  Lockie Pares., RD, LDN, CNSC See [facility] for contact information

## 2020-09-14 NOTE — Evaluation (Signed)
Speech Language Pathology Evaluation Patient Details Name: [name] MRN: [id number] DOB: Jun 12, 1945 Today's Date: 09/14/2020 Time: 5462-7035 SLP Time Calculation (min) (ACUTE ONLY): 18 min  Problem List:  Patient Active Problem List   Diagnosis Date Noted  . Hypoxia   . 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 Encounter for intubation   . HCAP (healthcare-associated pneumonia)   . Status post craniotomy 09/01/2020  . Brain tumor ([place]) 09/01/2020  . Hypertension 07/13/2020  . 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 Angina pectoris ([place])   . OSA on CPAP 11/01/2017  . Lumbar spinal stenosis 09/27/2017  . Primary osteoarthritis of left knee 08/06/2014  . Left knee DJD 08/06/2014  . Preoperative clearance 02/28/2012  . Exertional dyspnea 10/02/2011  . Atrial fibrillation ([place])   . Prostate cancer ([place])   . Palpitations   . Diabetes mellitus   . Hypercholesteremia    Past Medical History:  Past Medical History:  Diagnosis Date  . Anticoagulant long-term use    eliquis/ asa --- mangaed by cardiology  . Cancer [facility])    prostate  . Coronary artery disease cardiologist--- Truitt Merle PA   s/p cardiac cath 03-14-2019 severe 3V ;  05-27-2019  s/p  CABG x3/ clipping atrial appendage  . Depression   . DOE (dyspnea on exertion)    05-04-2020  per pt is is much better since CABG 01/ 2021,  recovers quickly after going up flight stairs  . Feeling of incomplete bladder emptying   . Full dentures   . Heart murmur   . History of kidney stones 2022   passed  . History of prostate cancer 1998   urologist--- dr Junious Silk---  s/p radioactive seed implants 1998  . History of urethral stricture    s/p  dilatation  . Hypertension   . Incisional hernia    per pt was told by dr gerhardt, had incisional hernia at CABG incision  . Macular degeneration of both eyes   . Nephrolithiasis    right side nonobstructive per CT 05-01-2020,  also had 2 right ureter stones  and on 05-04-2020 pt stated he passed 2 stones 05-03-2020  . OSA on CPAP    uses CPAP-settings 5  . Osteoarthritis (arthritis due to wear and tear of joints)   . Persistent atrial fibrillation [facility])    cardiologist-- Remer Macho PA  . Slow urinary stream   . Type 2 diabetes mellitus ([place])    followed by pcp  (05-04-2020  per pt fasting blood sugar average 98-120)  . 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 Wears hearing aid in both ears    Past Surgical History:  Past Surgical History:  Procedure Laterality Date  . APPLICATION OF CRANIAL NAVIGATION N/A 09/01/2020   Procedure: APPLICATION OF CRANIAL NAVIGATION;  Surgeon: Ashok Pall, MD;  Location: [place];  Service: Neurosurgery;  Laterality: N/A;  . CATARACT EXTRACTION W/ INTRAOCULAR LENS  IMPLANT, BILATERAL  2019  . CLIPPING OF ATRIAL APPENDAGE N/A 05/27/2019   Procedure: CLIPPING OF ATRIAL APPENDAGE - Using AtriCure Clip size 45MM;  Surgeon: Grace Isaac, MD;  Location: [place];  Service: Open Heart Surgery;  Laterality: N/A;  . CORONARY ARTERY BYPASS GRAFT N/A 05/27/2019   Procedure: CORONARY ARTERY BYPASS GRAFTING (CABG) x Three , using left internal mammary artery and right leg greater  saphenous vein harvested endoscopically LIMA to LAD, SVG to DIAG, SVG to RCA. Incision of left internal mammary lymph node Incision of Left Internal  Mediastinal Mass;  Surgeon: Grace Isaac, MD;  Location: [place];  Service: Open Heart Surgery;  Laterality: N/A;  . CRANIOTOMY Right 09/01/2020   Procedure: Right Temporal Craniotomy for Tumor with Brainlab;  Surgeon: Ashok Pall, MD;  Location: [place];  Service: Neurosurgery;  Laterality: Right;  . CRANIOTOMY N/A 09/07/2020   Procedure: CRANIOTOMY HEMATOMA EVACUATION SUBDURAL;  Surgeon: Ashok Pall, MD;  Location: [place];  Service: Neurosurgery;  Laterality: N/A;  . CYSTOSCOPY W/  RETROGRADES  04/04/2012   Procedure: CYSTOSCOPY WITH RETROGRADE PYELOGRAM;  Surgeon: Molli Hazard, MD;  Location: [facility];  Service: Urology;  Laterality: Bilateral;  . CYSTOSCOPY WITH RETROGRADE PYELOGRAM, URETEROSCOPY AND STENT PLACEMENT  04/04/2012   Procedure: CYSTOSCOPY WITH RETROGRADE PYELOGRAM, URETEROSCOPY AND STENT PLACEMENT;  Surgeon: Molli Hazard, MD;  Location: [facility];  Service: Urology;  Laterality: Right;  . EXCISION MASS ABDOMINAL N/A 05/19/2020   Procedure: EXCISION OF SUBCUTANEOUS MASS-ABDOMEN;  Surgeon: Ileana Roup, MD;  Location: [place];  Service: General;  Laterality: N/A;  . LEFT HEART CATH AND CORONARY ANGIOGRAPHY N/A 03/14/2019   Procedure: LEFT HEART CATH AND CORONARY ANGIOGRAPHY;  Surgeon: Belva Crome, MD;  Location: [place] [facility];  Service: Cardiovascular;  Laterality: N/A;  . LUMBAR LAMINECTOMY/DECOMPRESSION MICRODISCECTOMY N/A 09/27/2017   Procedure: LAMINECTOMY LUMBAR THREE- LUMBAR FOUR, LUMBAR FOUR- LUMBAR FIVE;  Surgeon: Ashok Pall, MD;  Location: [place];  Service: Neurosurgery;  Laterality: N/A;  . RADIOACTIVE PROSTATE SEED IMPLANTS  1998  . ROTATOR CUFF REPAIR Left approx. 2010  . TEE WITHOUT CARDIOVERSION N/A 05/27/2019   Procedure: TRANSESOPHAGEAL ECHOCARDIOGRAM (TEE);  Surgeon: Grace Isaac, MD;  Location: [place];  Service: Open Heart Surgery;  Laterality: N/A;  . TOTAL KNEE ARTHROPLASTY Left 08/06/2014   Procedure: LEFT TOTAL KNEE ARTHROPLASTY;  Surgeon: Susa Day, MD;  Location: [facility];  Service: Orthopedics;  Laterality: Left;   HPI:  75 yo male presented with memory loss and word finding problems, noted to have a mass lesion in his right superior temporal gyrus (glioblastoma) resected on 4/20 by Dr. Christella Noa.  He had post operative intraparenchymal hemorrhage and returned to OR 4/26 for craniotomy and evacuation of SDH. Intubated 4/26-5/2.   Assessment / Plan / Recommendation Clinical Impression   After several minutes, pt able to remain adequately awake given verbal/tactile stimulation as needed. Cranial nerve VII involvement resulting in labial asymmetry. Pt is left handed and exhibits communicative-cognitive impairments in initiation, sustained attention, problem solving, awareness, motor speech and vocalization,  He mouthed several words without phonation and neglects left side of environment. Comprehended and executed simple commands with 60% accuracy with delayed processing and repetition needed. Therapist will further evaluate his expressive language during ST inervention during sessions.    SLP Assessment  SLP Recommendation/Assessment: Patient needs continued Speech Lanaguage Pathology Services SLP Visit Diagnosis: Cognitive communication deficit (R41.841)    Follow Up Recommendations  Inpatient Rehab    Frequency and Duration min 2x/week  2 weeks      SLP Evaluation Cognition  Overall Cognitive Status: Impaired/Different from baseline Arousal/Alertness: Lethargic Orientation Level: Other (comment) (assess further) Attention: Sustained Sustained Attention: Impaired Sustained Attention Impairment: Functional basic;Verbal basic Memory:  (TBA) Awareness: Impaired Awareness Impairment: Emergent impairment Problem Solving: Impaired Problem Solving Impairment: Functional basic Safety/Judgment: Impaired       Comprehension  Auditory  Comprehension Overall Auditory Comprehension: Impaired Yes/No Questions: Impaired Basic Immediate Environment Questions: 25-49% accurate Commands: Impaired One Step Basic Commands: 50-74% accurate Interfering Components: Attention;Processing speed Visual Recognition/Discrimination Discrimination: Not tested Reading Comprehension Reading Status: Not tested    Expression Expression Primary Mode of Expression: Other (comment) (mouthed several words) Verbal Expression Overall Verbal Expression: Impaired Initiation: Impaired Level of  Generative/Spontaneous Verbalization: Word (mouthing) Naming: Not tested Pragmatics: Impairment Impairments: Eye contact Interfering Components: Attention Written Expression Dominant Hand: Left Written Expression: Not tested   Oral / Motor  Oral Motor/Sensory Function Overall Oral Motor/Sensory Function: Moderate impairment Facial ROM: Reduced left;Suspected CN VII (facial) dysfunction Facial Symmetry: Abnormal symmetry left;Suspected CN VII (facial) dysfunction Facial Strength: Reduced left;Suspected CN VII (facial) dysfunction Lingual ROM:  (apraxia-- unable to protrude tongue) Motor Speech Overall Motor Speech:  (UTA) Intelligibility: Unable to assess (comment) Motor Planning:  (suspect possible?)   GO                    [name] 09/14/2020, 11:19 AM  [name] Caroli.Ed Risk analyst [phone] Office [phone]

## 2020-09-14 NOTE — Progress Notes (Signed)
Radiation Oncology         [phone] ________________________________  Name: [name]        MRN: [id number]  Date of Service: 09/14/20 DOB: March 20, 1946  FM:[id number], Sean Quince, MD    REFERRING PHYSICIAN: Dr. Christella Noa  DIAGNOSIS: Diagnoses of Lethargy, Hypoxia, Encounter for intubation, and Acute on chronic respiratory failure with hypoxia [facility]) were pertinent to this visit.   HISTORY OF PRESENT ILLNESS: [name] is a 75 y.o. male seen at the request of Dr. Christella Noa for a newly diagnosed right temporal glioblastoma. The patient presented with changes in his memory and an MRI on 08/13/20 revealed a 2.9 cm mass in the right superior temporal gyrus. He had a repeat scan with contrast on 08/19/20 that measured the lesion at 3.3 xm with vasogenic edema and regional mass effect stable in the interval. He was offered surgical resection and underwent craniotomy of the right temporal tumor on 09/01/20. He had postop imaging that showed a 6.3 cm hematoma on POD#1. He was taken back to the OR as it persisted on 09/07/20 for an evacuation. Postop CT on 09/08/20 showed interval evacuation and there was improvement in the mass effect with shift now 7 mm to the left. He remained intubated between 09/07/20 and 09/13/20. His final pathology from his craniotomy showed glioblastoma. IHC is pending for IDH typing. We've been asked to weigh in on his case. Dr. Mickeal Skinner is also aware of his situation and plans to see the patient today.    PREVIOUS RADIATION THERAPY: Yes   1998: The patient received a radioactive seed implant with Dr. Danny Lawless   PAST MEDICAL HISTORY:  Past Medical History:  Diagnosis Date  . Anticoagulant long-term use    eliquis/ asa --- mangaed by cardiology  . Cancer [facility])    prostate  . Coronary artery disease cardiologist--- Truitt Merle PA   s/p cardiac cath 03-14-2019 severe 3V ;  05-27-2019  s/p  CABG x3/ clipping atrial appendage  . Depression   . DOE (dyspnea on exertion)     05-04-2020  per pt is is much better since CABG 01/ 2021,  recovers quickly after going up flight stairs  . Feeling of incomplete bladder emptying   . Full dentures   . Heart murmur   . History of kidney stones 2022   passed  . History of prostate cancer 1998   urologist--- dr Junious Silk---  s/p radioactive seed implants 1998  . History of urethral stricture    s/p  dilatation  . Hypertension   . Incisional hernia    per pt was told by dr gerhardt, had incisional hernia at CABG incision  . Macular degeneration of both eyes   . Nephrolithiasis    right side nonobstructive per CT 05-01-2020,  also had 2 right ureter stones and on 05-04-2020 pt stated he passed 2 stones 05-03-2020  . OSA on CPAP    uses CPAP-settings 5  . Osteoarthritis (arthritis due to wear and tear of joints)   . Persistent atrial fibrillation [facility])    cardiologist-- Remer Macho PA  . Slow urinary stream   . Type 2 diabetes mellitus ([place])    followed by pcp  (05-04-2020  per pt fasting blood sugar average 98-120)  . 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 Wears hearing aid in both ears  PAST SURGICAL HISTORY: Past Surgical History:  Procedure Laterality Date  . APPLICATION OF CRANIAL NAVIGATION N/A 09/01/2020   Procedure: APPLICATION OF CRANIAL NAVIGATION;  Surgeon: Ashok Pall, MD;  Location: [place];  Service: Neurosurgery;  Laterality: N/A;  . CATARACT EXTRACTION W/ INTRAOCULAR LENS  IMPLANT, BILATERAL  2019  . CLIPPING OF ATRIAL APPENDAGE N/A 05/27/2019   Procedure: CLIPPING OF ATRIAL APPENDAGE - Using AtriCure Clip size 45MM;  Surgeon: Grace Isaac, MD;  Location: [place];  Service: Open Heart Surgery;  Laterality: N/A;  . CORONARY ARTERY BYPASS GRAFT N/A 05/27/2019   Procedure: CORONARY ARTERY BYPASS GRAFTING (CABG) x Three , using left internal mammary artery and right leg  greater saphenous vein harvested endoscopically LIMA to LAD, SVG to DIAG, SVG to RCA. Incision of left internal mammary lymph node Incision of Left Internal  Mediastinal Mass;  Surgeon: Grace Isaac, MD;  Location: [place];  Service: Open Heart Surgery;  Laterality: N/A;  . CRANIOTOMY Right 09/01/2020   Procedure: Right Temporal Craniotomy for Tumor with Brainlab;  Surgeon: Ashok Pall, MD;  Location: [place];  Service: Neurosurgery;  Laterality: Right;  . CRANIOTOMY N/A 09/07/2020   Procedure: CRANIOTOMY HEMATOMA EVACUATION SUBDURAL;  Surgeon: Ashok Pall, MD;  Location: [place];  Service: Neurosurgery;  Laterality: N/A;  . CYSTOSCOPY W/ RETROGRADES  04/04/2012   Procedure: CYSTOSCOPY WITH RETROGRADE PYELOGRAM;  Surgeon: Molli Hazard, MD;  Location: [facility];  Service: Urology;  Laterality: Bilateral;  . CYSTOSCOPY WITH RETROGRADE PYELOGRAM, URETEROSCOPY AND STENT PLACEMENT  04/04/2012   Procedure: CYSTOSCOPY WITH RETROGRADE PYELOGRAM, URETEROSCOPY AND STENT PLACEMENT;  Surgeon: Molli Hazard, MD;  Location: [facility];  Service: Urology;  Laterality: Right;  . EXCISION MASS ABDOMINAL N/A 05/19/2020   Procedure: EXCISION OF SUBCUTANEOUS MASS-ABDOMEN;  Surgeon: Ileana Roup, MD;  Location: [place];  Service: General;  Laterality: N/A;  . LEFT HEART CATH AND CORONARY ANGIOGRAPHY N/A 03/14/2019   Procedure: LEFT HEART CATH AND CORONARY ANGIOGRAPHY;  Surgeon: Belva Crome, MD;  Location: [place] [facility];  Service: Cardiovascular;  Laterality: N/A;  . LUMBAR LAMINECTOMY/DECOMPRESSION MICRODISCECTOMY N/A 09/27/2017   Procedure: LAMINECTOMY LUMBAR THREE- LUMBAR FOUR, LUMBAR FOUR- LUMBAR FIVE;  Surgeon: Ashok Pall, MD;  Location: [place];  Service: Neurosurgery;  Laterality: N/A;  . RADIOACTIVE PROSTATE SEED IMPLANTS  1998  . ROTATOR CUFF REPAIR Left approx. 2010  . TEE WITHOUT CARDIOVERSION N/A 05/27/2019   Procedure: TRANSESOPHAGEAL ECHOCARDIOGRAM (TEE);   Surgeon: Grace Isaac, MD;  Location: [place];  Service: Open Heart Surgery;  Laterality: N/A;  . TOTAL KNEE ARTHROPLASTY Left 08/06/2014   Procedure: LEFT TOTAL KNEE ARTHROPLASTY;  Surgeon: Susa Day, MD;  Location: [facility];  Service: Orthopedics;  Laterality: Left;     FAMILY HISTORY:  Family History  Problem Relation Age of Onset  . Cancer Mother   . Dementia Mother   . Colon cancer Mother   . Heart attack Sister   . Stroke Sister   . Heart attack Father   . Esophageal cancer Neg Hx   . Rectal cancer Neg Hx   . Stomach cancer Neg Hx      SOCIAL HISTORY:  reports that he quit smoking about 47 years ago. His smoking use included cigarettes. He quit after 6.00 years of use. He has never used smokeless tobacco. He reports current alcohol use. He reports that he does not use drugs. The patient is married and lives in [place].    ALLERGIES: Lisinopril,  Other, Sulfa drugs cross reactors, Contrast media [iodinated diagnostic agents], Doxycycline, Ioxaglate, and Tetracyclines & related   MEDICATIONS:  Current Facility-Administered Medications  Medication Dose Route Frequency Provider Last Rate Last Admin  . 0.9 %  sodium chloride infusion   Intravenous PRN Ashok Pall, MD 10 mL/hr at 09/14/20 1200 Infusion Verify at 09/14/20 1200  . 0.9 %  sodium chloride infusion  250 mL Intravenous Continuous Ashok Pall, MD      . acetaminophen (TYLENOL) tablet 650 mg  650 mg Per Tube Q4H PRN Ashok Pall, MD   650 mg at 09/10/20 1547   Or  . acetaminophen (TYLENOL) suppository 650 mg  650 mg Rectal Q4H PRN Ashok Pall, MD      . chlorhexidine (PERIDEX) 0.12 % solution 15 mL  15 mL Mouth Rinse BID Dawley, Troy C, DO   15 mL at 09/14/20 0950  . Chlorhexidine Gluconate Cloth 2 % PADS 6 each  6 each Topical Daily Ashok Pall, MD   6 each at 09/13/20 2000  . dexamethasone (DECADRON) injection 4 mg  4 mg Intravenous BID Ashok Pall, MD   4 mg at 09/14/20 0953  . feeding supplement  (PIVOT 1.5 CAL) liquid 1,000 mL  1,000 mL Per Tube Continuous Ashok Pall, MD 65 mL/hr at 09/14/20 1055 1,000 mL at 09/14/20 1055  . fentaNYL (SUBLIMAZE) injection 25-50 mcg  25-50 mcg Intravenous Q4H PRN Estill Cotta, NP   25 mcg at 09/13/20 9163  . free water 200 mL  200 mL Per Tube Q8H Ashok Pall, MD   200 mL at 09/14/20 8466  . furosemide (LASIX) injection 40 mg  40 mg Intravenous Once Jennelle Human B, NP      . insulin aspart (novoLOG) injection 0-20 Units  0-20 Units Subcutaneous Q4H Desai, Rahul P, PA-C   4 Units at 09/14/20 1205  . insulin aspart (novoLOG) injection 12 Units  12 Units Subcutaneous Q4H Collene Gobble, MD   12 Units at 09/14/20 1205  . labetalol (NORMODYNE) injection 10-40 mg  10-40 mg Intravenous Q10 min PRN Ashok Pall, MD   20 mg at 09/07/20 0829  . levETIRAcetam (KEPPRA) 100 MG/ML solution 500 mg  500 mg Per Tube BID Shearon Stalls, Rahul P, PA-C   500 mg at 09/14/20 0950  . MEDLINE mouth rinse  15 mL Mouth Rinse q12n4p Dawley, Troy C, DO   15 mL at 09/14/20 1206  . metoprolol tartrate (LOPRESSOR) tablet 25 mg  25 mg Per Tube BID Kipp Brood, MD   25 mg at 09/14/20 0951  . multivitamin with minerals tablet 1 tablet  1 tablet Per Tube Daily Kipp Brood, MD   1 tablet at 09/14/20 0951  . ondansetron (ZOFRAN) tablet 4 mg  4 mg Per Tube Q4H PRN Ashok Pall, MD       Or  . ondansetron (ZOFRAN) injection 4 mg  4 mg Intravenous Q4H PRN Ashok Pall, MD      . pantoprazole sodium (PROTONIX) 40 mg/20 mL oral suspension 40 mg  40 mg Per Tube Daily Desai, Rahul P, PA-C   40 mg at 09/14/20 0950  . senna-docusate (Senokot-S) tablet 1 tablet  1 tablet Per Tube BID PRN Kipp Brood, MD   1 tablet at 09/09/20 1016  . senna-docusate (Senokot-S) tablet 1 tablet  1 tablet Per Tube BID Juanito Doom, MD   1 tablet at 09/14/20 0957     REVIEW OF SYSTEMS: On review of systems, the patient was sleeping and unable  to participate in the conversation. I spoke with his wife  by phone however to introduce our role as a part of his treatment team.     PHYSICAL EXAM:  Wt Readings from Last 3 Encounters:  09/13/20 211 lb 3.2 oz (95.8 kg)  07/22/20 218 lb 6.4 oz (99.1 kg)  07/01/20 221 lb (100.2 kg)   Temp Readings from Last 3 Encounters:  09/14/20 98.2 F (36.8 C) (Axillary)  05/19/20 (!) 97.5 F (36.4 C)  05/01/20 98 F (36.7 C) (Oral)   BP Readings from Last 3 Encounters:  09/14/20 (!) 119/92  07/22/20 130/84  07/13/20 (!) 142/80   Pulse Readings from Last 3 Encounters:  09/14/20 84  07/22/20 91  07/13/20 68   Pain Assessment Pain Score: Asleep/10  Unable to assess due to encounter type.   ECOG = 3  0 - Asymptomatic (Fully active, able to carry on all predisease activities without restriction)  1 - Symptomatic but completely ambulatory (Restricted in physically strenuous activity but ambulatory and able to carry out work of a light or sedentary nature. For example, light housework, office work)  2 - Symptomatic, <50% in bed during the day (Ambulatory and capable of all self care but unable to carry out any work activities. Up and about more than 50% of waking hours)  3 - Symptomatic, >50% in bed, but not bedbound (Capable of only limited self-care, confined to bed or chair 50% or more of waking hours)  4 - Bedbound (Completely disabled. Cannot carry on any self-care. Totally confined to bed or chair)  5 - Death   Eustace Pen MM, Creech RH, Tormey DC, et al. [phone]). "Toxicity and response criteria of the [facility]". [place] Oncol. 5 (6): 649-55    LABORATORY DATA:  Lab Results  Component Value Date   WBC 13.9 (H) 09/14/2020   HGB 11.6 (L) 09/14/2020   HCT 34.8 (L) 09/14/2020   MCV 92.3 09/14/2020   PLT 135 (L) 09/14/2020   Lab Results  Component Value Date   NA 144 09/14/2020   K 4.1 09/14/2020   CL 110 09/14/2020   CO2 26 09/14/2020   Lab Results  Component Value Date   ALT 23 09/11/2020   AST 21  09/11/2020   ALKPHOS 53 09/11/2020   BILITOT 0.6 09/11/2020      RADIOGRAPHY: CT HEAD WO CONTRAST  Result Date: 09/08/2020 CLINICAL DATA:  Postop craniotomy for hematoma evacuation EXAM: CT HEAD WITHOUT CONTRAST TECHNIQUE: Contiguous axial images were obtained from the base of the skull through the vertex without intravenous contrast. COMPARISON:  09/03/2020 FINDINGS: Brain: Interval right-sided craniotomy for parenchymal hematoma evacuation. Majority of the hematoma in the right temporoparietal lobe has been removed. There is gas in the surgical cavity with residual blood. Surrounding low-density edema is present around the hematoma. Improvement in mass-effect and midline shift. Midline shift now 7 mm. Mild subarachnoid hemorrhage. Negative for hydrocephalus. There is pneumocephalus bilaterally. There is gas in the right frontal horn. Layering blood is seen in the occipital horns bilaterally. Generalized atrophy. Extensive diffuse white matter hypodensity consistent with chronic microvascular ischemia. Vascular: Negative for hyperdense vessel Skull: Right-sided craniotomy flap in good position. Sinuses/Orbits: Mucosal edema left maxillary sinus. NG tube in place. Bilateral cataract extraction. Other: None IMPRESSION: Interval surgical evacuation of large right sided hematoma. Majority of the blood has been removed. There is improvement in mass-effect and midline shift now 7 mm to the left. Postop pneumocephalus in the frontal lobes bilaterally.  Mild amount of intraventricular air and blood remains in the ventricles. Extensive white matter hypodensity bilaterally most consistent with chronic microvascular ischemia which was present on preoperative studies. Electronically Signed   By: Franchot Gallo M.D.   On: 09/08/2020 10:26   CT HEAD WO CONTRAST  Result Date: 09/03/2020 CLINICAL DATA:  Follow-up known cerebral hemorrhage EXAM: CT HEAD WITHOUT CONTRAST TECHNIQUE: Contiguous axial images were obtained  from the base of the skull through the vertex without intravenous contrast. COMPARISON:  Head CT September 02, 2020 FINDINGS: Brain: Increased size of the large intraparenchymal hemorrhage centered in the right parietal temporal region which measures up to 6.2 x 6.1 x 5.6 cm previously 5.8 x 5.8 x 5.6 cm. Similar small volume overlying subarachnoid hemorrhage now with subarachnoid hemorrhage overlying the left occipital and posterior temporal lobes. Similar small amount of intraventricular hemorrhage within the right lateral ventricle and layering in the left occipital horn. Increased leftward shift now measuring 9 mm at the foramen of [place] previously 7 mm, with increased effacement of the third ventricle. Slightly increased early subfalcine herniation with similar suggestion of early uncal herniation. Surgical material and pneumocephalus subjacent to the right-sided craniotomy site again visualized. Vascular: Calcified atherosclerosis. Skull: Right-sided craniotomy. Sinuses/Orbits: Left maxillary sinus mucous retention cyst. Otherwise the paranasal sinuses are predominantly clear. Orbits are grossly unremarkable. Other: Partially visualized nasoenteric tube. IMPRESSION: 1. Increased size of the large intraparenchymal hemorrhage centered in the right parietotemporal region with increased surrounding edema and mass effect including increased leftward shift and effacement of the third ventricle. As well as slightly increased early subfalcine herniation with similar early uncal herniation. 2. Similar small volume of overlying subarachnoid hemorrhage, now with subarachnoid hemorrhage also located over the left occipital and posterior temporal lobes. 3. Similar small amount of intraventricular hemorrhage. These results were called by telephone at the time of interpretation on 09/03/2020 at 4:34 pm to provider Emelda Brothers, MD , who verbally acknowledged these results. Electronically Signed   By: Dahlia Bailiff MD   On:  09/03/2020 16:35   CT HEAD WO CONTRAST  Result Date: 09/02/2020 CLINICAL DATA:  Craniotomy, postop EXAM: CT HEAD WITHOUT CONTRAST TECHNIQUE: Contiguous axial images were obtained from the base of the skull through the vertex without intravenous contrast. COMPARISON:  Same day CT head. FINDINGS: Brain: Interval increase in size of a large intraparenchymal hemorrhage centered in the right parietotemporal region, which measures up to 5.6 x 5.8 x 5.8 cm (approximately 94 cm^3). Small volume overlying subarachnoid hemorrhage. Increased small amount of intraventricular hemorrhage within the adjacent right lateral ventricle and layering in the left occipital horn. Surrounding edema and mass effect. Effacement of the right lateral ventricle without specific evidence of hydrocephalus. Leftward midline shift is similar, measuring 7 mm at the foramen of [place]. Similar pneumocephalus and surgical material subjacent to the right-sided craniotomy. No evidence of large vascular territory acute infarct. Vascular: Calcific atherosclerosis. Skull: Right-sided craniotomy. Sinuses/Orbits: Left maxillary sinus retention cyst. Otherwise, clear sinuses. Unremarkable orbits. Other: No mastoid effusions. IMPRESSION: Interval increase in size of a large 5.8 cm intraparenchymal hemorrhage centered in the right parietotemporal region, as described above. Small volume overlying subarachnoid hemorrhage and increased small volume of intraventricular extension of hemorrhage in the adjacent right lateral ventricle and layering in the left occipital horn. Increased surrounding edema and mass effect with similar 7 mm of leftward midline shift. These results will be called to the ordering clinician or representative by the Radiologist Assistant, and communication documented in the PACS or  [facility] Dashboard. Electronically Signed   By: Margaretha Sheffield MD   On: 09/02/2020 08:55   CT HEAD WO CONTRAST  Result Date: 09/02/2020 CLINICAL DATA:   Recent tumor resection.  Left-sided weakness. EXAM: CT HEAD WITHOUT CONTRAST TECHNIQUE: Contiguous axial images were obtained from the base of the skull through the vertex without intravenous contrast. COMPARISON:  None. FINDINGS: Brain: Intraparenchymal hematoma centered in the right parietal/temporal lobe measures 4.1 x 6.3 x 5.0 cm (volume = 68 cm^3). There is leftward midline shift measuring 7 mm. Small volume pneumocephalus. There is periventricular hypoattenuation compatible with chronic microvascular disease. Vascular: No hyperdense vessel or unexpected calcification. Skull: Recent right-sided craniotomy. Sinuses/Orbits: Left maxillary retention cyst. Other: None IMPRESSION: 1. Large intraparenchymal hematoma centered in the right parietal/temporal lobe with 7 mm leftward midline shift. 2. Small volume pneumocephalus. 3. Critical Value/emergent results were called by telephone at the time of interpretation on 09/02/2020 at 12:39 am to provider TROY DAWLEY , who verbally acknowledged these results. Electronically Signed   By: Ulyses Jarred M.D.   On: 09/02/2020 00:40   CT HEAD W & WO CONTRAST  Result Date: 08/27/2020 CLINICAL DATA:  Brain tumor EXAM: CT HEAD WITHOUT AND WITH CONTRAST TECHNIQUE: Contiguous axial images were obtained from the base of the skull through the vertex without and with intravenous contrast CONTRAST:  72m OMNIPAQUE IOHEXOL 300 MG/ML  SOLN COMPARISON:  MRI brain with contrast 08/19/2020 FINDINGS: Brain: Enhancing mass lesion in the right superior temporal gyrus appears unchanged. There is irregular peripheral enhancement with central necrosis. The enhancing mass measures approximately 28 x 34 mm. There is a moderate amount of surrounding white matter hypodensity. No second lesion identified Extensive white matter hypodensities seen throughout both cerebral hemispheres as noted on recent MRI. Ventricle size normal. No midline shift. Negative for acute hemorrhage. Vascular: Normal  arterial flow voids. Skull: Negative Sinuses/Orbits: Mild mucosal edema left maxillary sinus. Bilateral cataract extraction Other: None IMPRESSION: Enhancing mass lesion right superior temporal lobe unchanged. The mass has peripheral enhancement and central necrosis. There is a moderate amount of surrounding white matter hypodensity. 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 Extensive white matter changes diffusely appears chronic and unchanged from prior MRI. Correlate with risk factors for small vessel ischemia. Electronically Signed   By: CFranchot GalloM.D.   On: 08/27/2020 16:15   MR BRAIN W CONTRAST  Result Date: 08/19/2020 CLINICAL DATA:  Follow-up examination for intracranial mass. EXAM: MRI HEAD WITH CONTRAST TECHNIQUE: Multiplanar, multiecho pulse sequences of the brain and surrounding structures were obtained with intravenous contrast. CONTRAST:  138mGADAVIST GADOBUTROL 1 MMOL/ML IV SOLN COMPARISON:  Recent brain MRI from 08/13/2020. FINDINGS: Brain: Previously identified mass positioned at the superior right temporal gyrus again seen. Lesion demonstrates avid heterogeneous predominant peripheral postcontrast enhancement. Lesion measures 3.3 x 3.3 x 3.2 cm on today's exam (AP by transverse by craniocaudad). Surrounding vasogenic edema and regional mass effect stable from previous. No midline shift. Basilar cisterns remain patent. No other mass lesion or abnormal enhancement seen elsewhere within the brain. Underlying atrophy with cerebral white matter disease again noted. Vascular: Normal intravascular enhancement seen throughout the brain. Skull and upper cervical spine: Craniocervical junction within normal limits. Upper cervical spine normal. Bone marrow signal intensity within normal limits. No focal marrow replacing lesion. Scalp soft tissues within normal limits. Sinuses/Orbits: Globes and orbital soft tissues demonstrate no acute finding. Paranasal sinuses remain largely clear. Other:  None. IMPRESSION: 1. 3.3 x 3.3 x 3.2 cm enhancing mass  positioned at the superior right temporal gyrus, most concerning for a primary CNS neoplasm/high-grade GBM. A solitary intracranial metastasis would be the primary differential consideration, although is felt to be less likely. 2. No other intracranial mass lesion or abnormal enhancement. Electronically Signed   By: Jeannine Boga M.D.   On: 08/19/2020 17:06   DG Chest Port 1 View  Result Date: 09/13/2020 CLINICAL DATA:  Hypoxia EXAM: PORTABLE CHEST 1 VIEW COMPARISON:  Sep 12, 2020 FINDINGS: Endotracheal tube tip is 4.5 cm above the carina. Enteric tube tip is below the diaphragm. No pneumothorax. There is slight bibasilar atelectasis. Lungs otherwise clear. Heart size and pulmonary vascularity normal. There is aortic atherosclerosis. Patient is status post coronary artery bypass grafting. There is a left atrial appendage clamp. No adenopathy. No bone lesions. IMPRESSION: Tube positions as described without pneumothorax. Mild bibasilar atelectasis. Lungs elsewhere clear. Stable cardiac silhouette. Postoperative changes noted. Aortic Atherosclerosis (ICD10-I70.0). Electronically Signed   By: Lowella Grip III M.D.   On: 09/13/2020 08:21   DG Chest Port 1 View  Result Date: 09/12/2020 CLINICAL DATA:  Acute on chronic respiratory failure with hypoxia EXAM: PORTABLE CHEST 1 VIEW COMPARISON:  Radiograph 09/07/2020 FINDINGS: Endotracheal tube tip terminates in the mid trachea, 5.4 cm from the carina. Transesophageal tube tip terminates beyond the GE junction, below the margins of imaging. Telemetry leads overlie the chest. Prior sternotomy and CABG with clipping of the left atrial appendage as well. Stable cardiomediastinal contours with a calcified aorta. Some streaky opacities in the lung bases likely reflect combination of scarring and atelectatic changes. No new consolidative process or convincing features of edema. Pulmonary vascularity is normally  distributed. No new layering effusion or pneumothorax. No acute osseous or soft tissue abnormality. IMPRESSION: Lines and tubes as above. Low volumes and streaky basilar opacities favoring a combination of atelectasis and scarring. Prior sternotomy and CABG with left atrial appendage occlusion. Aortic Atherosclerosis (ICD10-I70.0). Electronically Signed   By: Lovena Le M.D.   On: 09/12/2020 06:50   DG CHEST PORT 1 VIEW  Result Date: 09/07/2020 CLINICAL DATA:  Endotracheal tube placement. EXAM: PORTABLE CHEST 1 VIEW COMPARISON:  Same day. FINDINGS: The heart size and mediastinal contours are within normal limits. Endotracheal tube is in grossly good position. Feeding tube is seen entering stomach. Status post coronary bypass graft. Lungs are clear. The visualized skeletal structures are unremarkable. IMPRESSION: Endotracheal tube in grossly good position. No acute abnormality seen. Electronically Signed   By: Marijo Conception M.D.   On: 09/07/2020 17:58   DG CHEST PORT 1 VIEW  Result Date: 09/07/2020 CLINICAL DATA:  Hypoxia EXAM: PORTABLE CHEST 1 VIEW COMPARISON:  09/04/2020 FINDINGS: Lungs are clear. No pneumothorax or pleural effusion. Nasoenteric feeding tube extends into the upper abdomen. Cardiac size is mildly enlarged. Coronary artery bypass grafting and left atrial clipping has been performed. Pulmonary vascularity is normal. No acute bone abnormality. IMPRESSION: No active disease.  Stable cardiomegaly. Electronically Signed   By: Fidela Salisbury MD   On: 09/07/2020 05:33   DG CHEST PORT 1 VIEW  Result Date: 09/04/2020 CLINICAL DATA:  Lethargy.  Coronary artery disease. EXAM: PORTABLE CHEST 1 VIEW COMPARISON:  07/14/2019 FINDINGS: Stable mild cardiomegaly. Aortic atherosclerotic calcification noted. Prior CABG noted. A feeding tube is seen entering the stomach, however the distal tip is not within the field of view. Both lungs are clear. IMPRESSION: Stable mild cardiomegaly. No active lung  disease. Electronically Signed   By: Myles Rosenthal.D.  On: 09/04/2020 08:17       IMPRESSION/PLAN: 1. Glioblastoma of the right temporal lobe. Dr. Lisbeth Renshaw is aware of the patient's course and recent imaging, surgery, and pathology diagnosis. I spent time by phone introducing our department to the patient's wife. He has not met with Dr. Mickeal Skinner in Neuro Oncology yet, but we will follow along with him during his hospital visit. We will plan to meet back to discuss treatment of his cancer in more detail once he's out of the hospital setting. We did briefly discuss chemoradiation, and the course of treatment. We discussed the risks, benefits, short, and long term effects of radiotherapy, as well as the curative intent, and the patient's wife is interested in proceeding with this plan. Dr. Lisbeth Renshaw would offer the patient a course of 6 weeks of radiotherapy, but we would wait a few more weeks to ensure his healing prior to proceeding with simulation with IV contrast. We will see him in the outpatient setting on 09/30/20 for these visits with the hopes of starting treatment on 10/12/20.  In a visit lasting 45 minutes, greater than 50% of the time was spent by phone and in floor time discussing the patient's condition, in preparation for the discussion, and coordinating the patient's care.     Carola Rhine, [facility]   **Disclaimer: This note was dictated with voice recognition software. Similar sounding words can inadvertently be transcribed and this note may contain transcription errors which may not have been corrected upon publication of note.**

## 2020-09-14 NOTE — Evaluation (Signed)
Clinical/Bedside Swallow Evaluation Patient Details  Name: [name] MRN: [id number] Date of Birth: 10/11/1945  Today's Date: 09/14/2020 Time: SLP Start Time (ACUTE ONLY): 0957 SLP Stop Time (ACUTE ONLY): 1028 SLP Time Calculation (min) (ACUTE ONLY): 12 min  Past Medical History:  Past Medical History:  Diagnosis Date  . Anticoagulant long-term use    eliquis/ asa --- mangaed by cardiology  . Cancer [facility])    prostate  . Coronary artery disease cardiologist--- Truitt Merle PA   s/p cardiac cath 03-14-2019 severe 3V ;  05-27-2019  s/p  CABG x3/ clipping atrial appendage  . Depression   . DOE (dyspnea on exertion)    05-04-2020  per pt is is much better since CABG 01/ 2021,  recovers quickly after going up flight stairs  . Feeling of incomplete bladder emptying   . Full dentures   . Heart murmur   . History of kidney stones 2022   passed  . History of prostate cancer 1998   urologist--- dr Junious Silk---  s/p radioactive seed implants 1998  . History of urethral stricture    s/p  dilatation  . Hypertension   . Incisional hernia    per pt was told by dr gerhardt, had incisional hernia at CABG incision  . Macular degeneration of both eyes   . Nephrolithiasis    right side nonobstructive per CT 05-01-2020,  also had 2 right ureter stones and on 05-04-2020 pt stated he passed 2 stones 05-03-2020  . OSA on CPAP    uses CPAP-settings 5  . Osteoarthritis (arthritis due to wear and tear of joints)   . Persistent atrial fibrillation [facility])    cardiologist-- Remer Macho PA  . Slow urinary stream   . Type 2 diabetes mellitus ([place])    followed by pcp  (05-04-2020  per pt fasting blood sugar average 98-120)  . 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 Wears hearing aid in both ears    Past Surgical History:  Past Surgical History:  Procedure Laterality Date  .  APPLICATION OF CRANIAL NAVIGATION N/A 09/01/2020   Procedure: APPLICATION OF CRANIAL NAVIGATION;  Surgeon: Ashok Pall, MD;  Location: [place];  Service: Neurosurgery;  Laterality: N/A;  . CATARACT EXTRACTION W/ INTRAOCULAR LENS  IMPLANT, BILATERAL  2019  . CLIPPING OF ATRIAL APPENDAGE N/A 05/27/2019   Procedure: CLIPPING OF ATRIAL APPENDAGE - Using AtriCure Clip size 45MM;  Surgeon: Grace Isaac, MD;  Location: [place];  Service: Open Heart Surgery;  Laterality: N/A;  . CORONARY ARTERY BYPASS GRAFT N/A 05/27/2019   Procedure: CORONARY ARTERY BYPASS GRAFTING (CABG) x Three , using left internal mammary artery and right leg greater saphenous vein harvested endoscopically LIMA to LAD, SVG to DIAG, SVG to RCA. Incision of left internal mammary lymph node Incision of Left Internal  Mediastinal Mass;  Surgeon: Grace Isaac, MD;  Location: [place];  Service: Open Heart Surgery;  Laterality: N/A;  . CRANIOTOMY Right 09/01/2020   Procedure: Right Temporal Craniotomy for Tumor with Brainlab;  Surgeon: Ashok Pall, MD;  Location: [place];  Service: Neurosurgery;  Laterality: Right;  . CRANIOTOMY N/A 09/07/2020   Procedure: CRANIOTOMY HEMATOMA EVACUATION SUBDURAL;  Surgeon: Ashok Pall, MD;  Location: [place];  Service: Neurosurgery;  Laterality: N/A;  . CYSTOSCOPY W/ RETROGRADES  04/04/2012   Procedure: CYSTOSCOPY WITH RETROGRADE PYELOGRAM;  Surgeon: Dennard Schaumann  Jasmine December, MD;  Location: [facility];  Service: Urology;  Laterality: Bilateral;  . CYSTOSCOPY WITH RETROGRADE PYELOGRAM, URETEROSCOPY AND STENT PLACEMENT  04/04/2012   Procedure: CYSTOSCOPY WITH RETROGRADE PYELOGRAM, URETEROSCOPY AND STENT PLACEMENT;  Surgeon: Molli Hazard, MD;  Location: [facility];  Service: Urology;  Laterality: Right;  . EXCISION MASS ABDOMINAL N/A 05/19/2020   Procedure: EXCISION OF SUBCUTANEOUS MASS-ABDOMEN;  Surgeon: Ileana Roup, MD;  Location: [place];  Service: General;  Laterality: N/A;  . LEFT  HEART CATH AND CORONARY ANGIOGRAPHY N/A 03/14/2019   Procedure: LEFT HEART CATH AND CORONARY ANGIOGRAPHY;  Surgeon: Belva Crome, MD;  Location: [place] [facility];  Service: Cardiovascular;  Laterality: N/A;  . LUMBAR LAMINECTOMY/DECOMPRESSION MICRODISCECTOMY N/A 09/27/2017   Procedure: LAMINECTOMY LUMBAR THREE- LUMBAR FOUR, LUMBAR FOUR- LUMBAR FIVE;  Surgeon: Ashok Pall, MD;  Location: [place];  Service: Neurosurgery;  Laterality: N/A;  . RADIOACTIVE PROSTATE SEED IMPLANTS  1998  . ROTATOR CUFF REPAIR Left approx. 2010  . TEE WITHOUT CARDIOVERSION N/A 05/27/2019   Procedure: TRANSESOPHAGEAL ECHOCARDIOGRAM (TEE);  Surgeon: Grace Isaac, MD;  Location: [place];  Service: Open Heart Surgery;  Laterality: N/A;  . TOTAL KNEE ARTHROPLASTY Left 08/06/2014   Procedure: LEFT TOTAL KNEE ARTHROPLASTY;  Surgeon: Susa Day, MD;  Location: [facility];  Service: Orthopedics;  Laterality: Left;   HPI:  75 yo male presented with memory loss and word finding problems, noted to have a mass lesion in his right superior temporal gyrus (glioblastoma) resected on 4/20 by Dr. Christella Noa.  He had post operative intraparenchymal hemorrhage and returned to OR 4/26 for craniotomy and evacuation of SDH. Intubated 4/26-5/2.   Assessment / Plan / Recommendation Clinical Impression  Pt participatory with verbal and tactile cues to therapist and ice chips and stimulation to maintain alertness initially. He could not cough or initiate vocalization during evaluation.Barriers impacting po intake include short duration of sustained attention and fluctuating alertness. He manipulated ice chips over extended period with delays and mild holding. Three-four swallows per ice chip indicative of pharyngeal residue. Continue oral care, NPO and ongoing diagnostic treatment of swallow abilities. SLP Visit Diagnosis: Dysphagia, unspecified (R13.10)    Aspiration Risk  Moderate aspiration risk    Diet Recommendation NPO   Medication  Administration: Via alternative means    Other  Recommendations Oral Care Recommendations: Oral care QID   Follow up Recommendations Inpatient Rehab      Frequency and Duration min 2x/week  2 weeks       Prognosis Prognosis for Safe Diet Advancement: Fair Barriers to Reach Goals: Other (Comment) (diagnosis)      Swallow Study   General HPI: 75 yo male presented with memory loss and word finding problems, noted to have a mass lesion in his right superior temporal gyrus (glioblastoma) resected on 4/20 by Dr. Christella Noa.  He had post operative intraparenchymal hemorrhage and returned to OR 4/26 for craniotomy and evacuation of SDH. Intubated 4/26-5/2. Type of Study: Bedside Swallow Evaluation Previous Swallow Assessment:  (none) Diet Prior to this Study: NPO;NG Tube Temperature Spikes Noted: No Respiratory Status: Nasal cannula History of Recent Intubation: Yes Length of Intubations (days): 7 days Date extubated: 09/13/20 Behavior/Cognition: Lethargic/Drowsy;Requires cueing;Cooperative Oral Cavity Assessment: Dry Oral Care Completed by SLP: Yes Oral Cavity - Dentition: Dentures, top;Dentures, bottom (not donned) Vision: Impaired for self-feeding Self-Feeding Abilities: Needs assist Patient Positioning: Upright in bed Baseline Vocal Quality:  (no vocalization) Volitional Cough: Cognitively unable to elicit Volitional Swallow: Unable to elicit  Oral/Motor/Sensory Function Overall Oral Motor/Sensory Function: Moderate impairment Facial ROM: Reduced left;Suspected CN VII (facial) dysfunction Facial Symmetry: Abnormal symmetry left;Suspected CN VII (facial) dysfunction Facial Strength: Reduced left;Suspected CN VII (facial) dysfunction Lingual ROM:  (apraxia-- unable to protrude tongue)   Ice Chips Ice chips: Impaired Presentation: Spoon Oral Phase Impairments: Reduced lingual movement/coordination Oral Phase Functional Implications: Prolonged oral transit;Oral  holding Pharyngeal Phase Impairments: Multiple swallows;Suspected delayed Swallow   Thin Liquid Thin Liquid: Not tested    Nectar Thick Nectar Thick Liquid: Not tested   Honey Thick Honey Thick Liquid: Not tested   Puree Puree: Not tested   Solid     Solid: Not tested      [name] 09/14/2020,10:50 AM   [name] Caroli.Ed Risk analyst [phone] Office [phone]

## 2020-09-14 NOTE — Progress Notes (Signed)
NAME:  [name], MRN:  [id number], DOB:  12/20/1945, LOS: 32 ADMISSION DATE:  09/01/2020, CONSULTATION DATE:  4/26 REFERRING MD:  Christella Noa, CHIEF COMPLAINT:  Inability to protect airway   History of Present Illness:  75 y/o male presented to [facility] with memory loss and word finding problems, noted to have a mass lesion in his right superior temporal gyrus which was resected on 4/20 by Dr. Christella Noa.  He had post operative intraparenchymal hemorrhage. He remains in the 4N ICU.  Pertinent  Medical History  CAD Depression Kidney stones Prostate cancer Hypertension Macular degeneration OSA on CPAP Persistent atrial fibrillation DM2 Hard of hearing, has hearing aids  Significant Hospital Events: Including procedures, antibiotic start and stop dates in addition to other pertinent events    4/20 admit, to OR for resection of GBM  4/21 IPH  4/26 intubated  4/27 return to OR for craniotomy and evacuation of SDH.  4/27 CT head > most of the large right sided hematoma resected, improvement in mass effect, 26mm midline shift, post op pneumocephalus, some blood in ventriculs, extensive white matter hypodensity consistent with chronic microvascular ischemia  4/28 sedation stopped. Cefazolin>   4/29 fent gtt off  5/1 Trach Aspirate > Prelim GNR  5/2 Extubated  Interim History / Subjective:  Extubated yesterday  NAE overnight  TMAX: 98.3  +BM, X2 unmeasured voids. + 12L this admission  Unable to obtain subjective evaluation due to patient status  Objective   Blood pressure (!) 119/92, pulse 84, temperature 97.9 F (36.6 C), temperature source Axillary, resp. rate 15, height 6' (1.829 m), weight 95.8 kg, SpO2 95 %.    Vent Mode: CPAP;PSV FiO2 (%):  [28 %-40 %] 28 % PEEP:  [5 cmH20] 5 cmH20 Pressure Support:  [5 cmH20] 5 cmH20   Intake/Output Summary (Last 24 hours) at 09/14/2020 1127 Last data filed at 09/14/2020 1000 Gross per 24 hour  Intake 2646.67 ml  Output 300 ml  Net  2346.67 ml   Filed Weights   09/11/20 0500 09/12/20 0500 09/13/20 0500  Weight: 96.1 kg 96.2 kg 95.8 kg    Examination:  General: in bed, ill appearing, no acute distress  HEENT: MM pink/moist, anicteric, trachea midline  Neuro: GCS 9, Non verbal, opens eyes to voice, purposeful, RASS 0, PERRL 40mm CV: S1S2, Afib, no m/r/g appreciated PULM: Clear in the upper lobes, Clear in the lower lobes, chest expansion symmetric GI: soft, bsx4 active, non distended Extremities: warm/dry, no pretibial edema, capillary refill less than 3 seconds  Skin: L crani site c/d/i with staples   Labs/imaging that I havepersonally reviewed  (right click and "Reselect all SmartList Selections" daily)  CBC, BMP  Resolved Hospital Problem list     Assessment & Plan:  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> evacuation on 4/26, mass effect improved Acute encephalopathy due to the above causes -Management per Neurosurgery -Continue Dexamethasone per NSGY, 4MG  BID, spoke with Dr. Christella Noa about possibility of reducing dose due to possibility of delirium. NSGY does not feel that this would be safe at this time.  -Continue Keppra per NSGY -Crani site care per NSGY -Continue q2h neurochecks -Neurosurgery plans to consult Neuro-oncology regarding glioblastoma. Will follow for recommendations.   Acute hypoxemic respiratory failure due to aspiration and poor airway protection MSSA aspiration PNA WBC/Fever curve downtrending 15>20.7>14.3> 13.9. Extubated yesterday. On 2L  -Aggressive pulmonary hygiene. DB cough, OOB as able, airway suctioning. -Follow up TA final growth -Continue Cefazolin for MSSA PNU  for 7 days total coverage -Will consider diuresis today. -High risk for reintubation due to mental status. Consider Palliative care consult for goals of care regarding reintubation.   Atrial fibrillation CAD hx CABG, hypertension Rate controlled -Continue to hold AC. AC decision per  neuro -Continue Metoprolol 25mg  BID -Continue holding home losartan, amlodipine  OSA on CPAP at home -CPAP at night if neuro status allows and able to place straps safely with crani site.  AKI> stable, improving Creat 1.42> 1.27> 1.37> 1.23 -Ensure renal perfusion. Goal MAP 65 or greater. -Avoid neprotoxic drugs as possible. -Strict I&O's -Follow up AM creatinine with dose of lasix today  Hypernatremia: iatrogenic as was on hypertonic saline NA 145 to 147 to 144 -Continue free water at 200cc Q8H -Follow up AM BMP NA level -Avoid rapid correction   Hyperglycemia BG 151-239 -Blood Glucose goal 140-180. -Continue SSI with additional TF coverage. BG range improved today. Holding off on starting long acting insulin today in the event of tube feeds needing to be stopped.  Consider adding tomorrow.     Best practice (right click and "Reselect all SmartList Selections" daily)  Diet:  Tube Feed  Pain/Anxiety/Delirium protocol (if indicated): Yes (RASS goal 0) VAP protocol (if indicated): Not indicated DVT prophylaxis: SCD GI prophylaxis: PPI Glucose control:  SSI Yes Central venous access:  N/A Arterial line:  N/A Foley:  N/A Mobility:  OOB  PT consulted: Yes Last date of multidisciplinary goals of care discussion [Per Primary] Code Status:  full code Disposition: ICU   Critical care time: NA    Redmond School., MSN, APRN, AGACNP-BC [place]  09/14/2020 , 11:27 AM  Please see [email or web address] for pager details  If no response, please call [phone] After hours, please call Elink at [phone]

## 2020-09-14 NOTE — Progress Notes (Signed)
Patient ID: [name], male   DOB: 1945/10/14, 75 y.o.   MRN: [id number] BP 110/75   Pulse 89   Temp 97.7 F (36.5 C) (Axillary)   Resp (!) 24   Ht 6' (1.829 m)   Wt 95.8 kg   SpO2 96%   BMI 28.64 kg/m  Lethargic, easily arouses. Purposeful with right upper extremity. Will follow some simple commands.  Perrl, full eom Left facial droop, left sided plegia Edema of paralysis left lower extremity.  Appreciate neurooncology visit.  Will proceed with palliative care.

## 2020-09-14 NOTE — Progress Notes (Signed)
PT Cancellation Note  Patient Details Name: [name] MRN: [id number] DOB: [birth year]   Cancelled Treatment:    Reason Eval/Treat Not Completed: Active bedrest order   Wyona Almas, PT, DPT Acute Rehabilitation Services Pager [phone] Office [phone]    [name] 09/14/2020, 10:50 AM

## 2020-09-15 DIAGNOSIS — C712 Malignant neoplasm of temporal lobe: Secondary | ICD-10-CM | POA: Diagnosis not present

## 2020-09-15 DIAGNOSIS — J189 Pneumonia, unspecified organism: Secondary | ICD-10-CM | POA: Diagnosis not present

## 2020-09-15 DIAGNOSIS — J9621 Acute and chronic respiratory failure with hypoxia: Secondary | ICD-10-CM | POA: Diagnosis not present

## 2020-09-15 LAB — GLUCOSE, CAPILLARY
Glucose-Capillary: 259 mg/dL — ABNORMAL HIGH (ref 70–99)
Glucose-Capillary: 150 mg/dL — ABNORMAL HIGH (ref 70–99)
Glucose-Capillary: 136 mg/dL — ABNORMAL HIGH (ref 70–99)
Glucose-Capillary: 132 mg/dL — ABNORMAL HIGH (ref 70–99)
Glucose-Capillary: 150 mg/dL — ABNORMAL HIGH (ref 70–99)
Glucose-Capillary: 114 mg/dL — ABNORMAL HIGH (ref 70–99)

## 2020-09-15 LAB — CULTURE, RESPIRATORY W GRAM STAIN

## 2020-09-15 LAB — BASIC METABOLIC PANEL
Anion gap: 8 (ref 5–15)
BUN: 71 mg/dL — ABNORMAL HIGH (ref 8–23)
CO2: 26 mmol/L (ref 22–32)
Calcium: 8.6 mg/dL — ABNORMAL LOW (ref 8.9–10.3)
Chloride: 109 mmol/L (ref 98–111)
Creatinine, Ser: 1.29 mg/dL — ABNORMAL HIGH (ref 0.61–1.24)
GFR, Estimated: 57 mL/min — ABNORMAL LOW (ref >60)
Glucose, Bld: 218 mg/dL — ABNORMAL HIGH (ref 70–99)
Potassium: 4.6 mmol/L (ref 3.5–5.1)
Sodium: 143 mmol/L (ref 135–145)

## 2020-09-15 MED ORDER — INSULIN GLARGINE 100 UNIT/ML ~~LOC~~ SOLN
5.0000 [IU] | Freq: Every day | SUBCUTANEOUS | Status: DC
  Administered 2020-09-15 – 2020-10-01 (×17): 5 [IU] via SUBCUTANEOUS
  Filled 2020-09-15 (×19): qty 0.05

## 2020-09-15 MED ORDER — SODIUM CHLORIDE 0.9 % IV SOLN
2.0000 g | Freq: Three times a day (TID) | INTRAVENOUS | Status: AC
  Administered 2020-09-15 – 2020-09-22 (×21): 2 g via INTRAVENOUS
  Filled 2020-09-15 (×22): qty 2

## 2020-09-15 NOTE — Progress Notes (Addendum)
Physical Therapy Treatment Patient Details Name: [name] MRN: [id number] DOB: 05-11-1946 Today's Date: 09/15/2020    History of Present Illness 75 yo male presented with memory loss and dysarthria. 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 Evacuation of SDH 4/27. bone flap in place Intubated 4/26-5/2.  PMH CAD depression kidney stones prostate CA HTN Macular degeneration OSA on CPAP, Afib DM2 HOH with hearing aides CABG CAD    PT Comments    Pt alert and blinks to threat; demonstrates right gaze preference and not visually tracking therapist. Session focused on sitting edge of bed to promote arousal and core/trunk activation. Pt requiring two person total assist for bed mobility. BP 128/93 sitting.Pt followed x 2 commands this session, to squeeze wife's hand and then to "let go." He purposefully moves the right side, but no activation noted on left. Does withdraw from painful stimuli on the left. Recommend maxi sky for transfer out of bed.    Follow Up Recommendations  SNF     Equipment Recommendations  Wheelchair (measurements PT);Wheelchair cushion (measurements PT);Hospital bed;Other (comment) (hoyer lift)    Recommendations for Other Services       Precautions / Restrictions Precautions Precautions: Fall Precaution Comments: hearing aides, top/bottom dentures, NGT Restrictions Weight Bearing Restrictions: No    Mobility  Bed Mobility Overal bed mobility: Needs Assistance Bed Mobility: Supine to Sit;Sit to Supine     Supine to sit: Total assist;+2 for physical assistance Sit to supine: Total assist;+2 for physical assistance   General bed mobility comments: TotalA + 2 for supine <> sit, assist for BLE and trunk management    Transfers                    Ambulation/Gait                 Stairs             Wheelchair Mobility    Modified Rankin (Stroke Patients  Only)       Balance Overall balance assessment: Needs assistance Sitting-balance support: Feet supported;Single extremity supported Sitting balance-Leahy Scale: Zero Sitting balance - Comments: Pt requiring mod-totalA for sitting balance, forward flexed posture with rounded shoulders, anterior and left lateral lean bias. No balance reactions noted                                    Cognition Arousal/Alertness: Awake/alert Behavior During Therapy: Flat affect Overall Cognitive Status: Impaired/Different from baseline Area of Impairment: Following commands                       Following Commands: Follows one step commands inconsistently       General Comments: Pt not visually tracking, right gaze preference, blinks to threat. Able to follow 2 commands this session, squeezing his wife's hand and then removing his hand from her grasp. Purposefully moving R side i.e. rubbing his face, placing hand behind his head.      Exercises General Exercises - Lower Extremity Ankle Circles/Pumps: PROM;Left;10 reps;Supine Heel Slides: PROM;Left;Supine;Other (comment) (7 reps)    General Comments        Pertinent Vitals/Pain Pain Assessment: Faces Faces Pain Scale: Hurts a little bit Pain Location: Withdrew from painful stimulus on left    Home Living  Prior Function            PT Goals (current goals can now be found in the care plan section) Acute Rehab PT Goals Patient Stated Goal: none stated PT Goal Formulation: With patient/family Time For Goal Achievement: 09/28/20 Potential to Achieve Goals: Fair    Frequency    Min 3X/week      PT Plan Current plan remains appropriate    Co-evaluation PT/OT/SLP Co-Evaluation/Treatment: Yes Reason for Co-Treatment: Complexity of the patient's impairments (multi-system involvement);Necessary to address cognition/behavior during functional activity;For patient/therapist  safety;To address functional/ADL transfers PT goals addressed during session: Mobility/safety with mobility        AM-PAC PT "6 Clicks" Mobility   Outcome Measure  Help needed turning from your back to your side while in a flat bed without using bedrails?: Total Help needed moving from lying on your back to sitting on the side of a flat bed without using bedrails?: Total Help needed moving to and from a bed to a chair (including a wheelchair)?: Total Help needed standing up from a chair using your arms (e.g., wheelchair or bedside chair)?: Total Help needed to walk in hospital room?: Total Help needed climbing 3-5 steps with a railing? : Total 6 Click Score: 6    End of Session   Activity Tolerance: Other (comment) (no change in vital signs) Patient left: in bed;with call bell/phone within reach;with SCD's reapplied Nurse Communication: Mobility status PT Visit Diagnosis: Other abnormalities of gait and mobility (R26.89);Hemiplegia and hemiparesis Hemiplegia - Right/Left: Left Hemiplegia - dominant/non-dominant: Dominant Hemiplegia - caused by: Nontraumatic intracerebral hemorrhage     Time: 0109-3235 PT Time Calculation (min) (ACUTE ONLY): 30 min  Charges:  $Therapeutic Activity: 8-22 mins                     Wyona Almas, PT, DPT Acute Rehabilitation Services Pager [phone] Office [place] T Brown 09/15/2020, 1:12 PM

## 2020-09-15 NOTE — Progress Notes (Signed)
SLP Cancellation Note  Patient Details Name: [name] MRN: [id number] DOB: 06-02-1945   Cancelled treatment:        Pt poorly alert after PT/OT sessions   Gustav Knueppel, Katherene Ponto 09/15/2020, 3:18 PM

## 2020-09-15 NOTE — Progress Notes (Signed)
Orthopedic Tech Progress Note Patient Details:  [name] 06/18/1945 [id number] Called in order to [employer] for a RESTING HAND SPLINT Patient ID: [name], male   DOB: 1945/08/21, 75 y.o.   MRN: [id number]   Janit Pagan 09/15/2020, 2:53 PM

## 2020-09-15 NOTE — Progress Notes (Signed)
Occupational Therapy Treatment Patient Details Name: [name] MRN: [id number] DOB: 08-Mar-1946 Today's Date: 09/15/2020    History of present illness 75 yo male presented with memory loss and dysarthria. 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 Evacuation of SDH 4/27. bone flap in place Intubated 4/26-5/2.  PMH CAD depression kidney stones prostate CA HTN Macular degeneration OSA on CPAP, Afib DM2 HOH with hearing aides CABG CAD   OT comments  Pt currently requires total +2 max-total(A) for bed mobility with R gaze preference. Pt provided facilitation for neck rotation toward L with resistance. Pt unable to sustain static sitting without (A). Pt following 2 step command to hold hand and then release in R visual field. Pt non verbal throughout entire session. Recommendation SNF at this time pending progress.   Recommending LLE prafo and LUE resting hand splint.  Follow Up Recommendations  SNF    Equipment Recommendations  Wheelchair (measurements OT);Wheelchair cushion (measurements OT);Hospital bed;Other (comment)    Recommendations for Other Services Speech consult;Other (comment)    Precautions / Restrictions Precautions Precautions: Fall Precaution Comments: hearing aides, top/bottom dentures, cortrak Restrictions Weight Bearing Restrictions: No       Mobility Bed Mobility Overal bed mobility: Needs Assistance Bed Mobility: Supine to Sit;Sit to Supine     Supine to sit: Total assist;+2 for physical assistance Sit to supine: Total assist;+2 for physical assistance   General bed mobility comments: TotalA + 2 for supine <> sit, assist for BLE and trunk management    Transfers                      Balance Overall balance assessment: Needs assistance Sitting-balance support: Feet supported;Single extremity supported Sitting balance-Leahy Scale: Zero Sitting balance - Comments: Pt  requiring mod-totalA for sitting balance, forward flexed posture with rounded shoulders, anterior and left lateral lean bias. No balance reactions noted. pt with strong R neck rotation and unable to reach full neutral midline                                   ADL either performed or assessed with clinical judgement   ADL                                               Vision   Additional Comments: R gaze preference, blinks to threat, Does not track to midline,   Perception     Praxis      Cognition Arousal/Alertness: Awake/alert Behavior During Therapy: Flat affect Overall Cognitive Status: Impaired/Different from baseline Area of Impairment: Following commands                       Following Commands: Follows one step commands inconsistently       General Comments: Pt not visually tracking, right gaze preference, blinks to threat. Able to follow 2 commands this session, squeezing his wife's hand and then removing his hand from her grasp. Purposefully moving R side i.e. rubbing his face, placing hand behind his head at site of incision. reaching for bed rail at EOB        Exercises Exercises: General Lower Extremity General Exercises - Lower Extremity Ankle Circles/Pumps: PROM;Left;10 reps;Supine Heel Slides:  (  7 reps)   Shoulder Instructions       General Comments VSS    Pertinent Vitals/ Pain       Pain Assessment: Faces Faces Pain Scale: Hurts a little bit Pain Location: generalized Pain Intervention(s): Repositioned;Monitored during session  Home Living                                          Prior Functioning/Environment              Frequency  Min 2X/week        Progress Toward Goals  OT Goals(current goals can now be found in the care plan section)  Progress towards OT goals: Progressing toward goals  Acute Rehab OT Goals Patient Stated Goal: none stated OT Goal Formulation:  Patient unable to participate in goal setting Time For Goal Achievement: 09/27/20 Potential to Achieve Goals: Good ADL Goals Additional ADL Goal #1: pt will initiate bed mobility with R UE 50% of session Additional ADL Goal #2: Pt will complete bed mobilty total +2 mod (A) as precursor to adls Additional ADL Goal #3: pt will follow 1 step command 50% of session  Plan Discharge plan remains appropriate    Co-evaluation    PT/OT/SLP Co-Evaluation/Treatment: Yes Reason for Co-Treatment: Complexity of the patient's impairments (multi-system involvement);Necessary to address cognition/behavior during functional activity;For patient/therapist safety;To address functional/ADL transfers PT goals addressed during session: Mobility/safety with mobility OT goals addressed during session: ADL's and self-care;Proper use of Adaptive equipment and DME;Strengthening/ROM      AM-PAC OT "6 Clicks" Daily Activity     Outcome Measure   Help from another person eating meals?: Total Help from another person taking care of personal grooming?: Total Help from another person toileting, which includes using toliet, bedpan, or urinal?: Total Help from another person bathing (including washing, rinsing, drying)?: Total Help from another person to put on and taking off regular upper body clothing?: Total Help from another person to put on and taking off regular lower body clothing?: Total 6 Click Score: 6    End of Session    OT Visit Diagnosis: Unsteadiness on feet (R26.81);Muscle weakness (generalized) (M62.81);Hemiplegia and hemiparesis Hemiplegia - Right/Left: Left Hemiplegia - dominant/non-dominant: Dominant Hemiplegia - caused by: Nontraumatic SAH   Activity Tolerance Patient tolerated treatment well   Patient Left in bed;with call bell/phone within reach;with bed alarm set;with SCD's reapplied   Nurse Communication Mobility status;Precautions        Time: 0102-7253 OT Time Calculation  (min): 31 min  Charges: OT General Charges $OT Visit: 1 Visit OT Treatments $Self Care/Home Management : 8-22 mins   Brynn, OTR/L  Acute Rehabilitation Services Pager: [phone] Office: [phone] .    Jeri Modena 09/15/2020, 2:09 PM

## 2020-09-15 NOTE — Progress Notes (Signed)
Palliative:  Consult received and chart reviewed. Spoke briefly with wife via telephone - she is currently not at bedside, has some errands today. Meeting scheduled for Thursday 5/5 at [place], DNP, [facility] Palliative Medicine Team Team Phone # [phone]  Pager # [phone]  NO CHARGE

## 2020-09-15 NOTE — Progress Notes (Signed)
NAME:  [name], MRN:  [id number], DOB:  09-26-1945, LOS: 39 ADMISSION DATE:  09/01/2020, CONSULTATION DATE:  4/26 REFERRING MD:  [name], CHIEF COMPLAINT:  Inability to protect airway   History of Present Illness:  75 y/o male presented to [facility] with memory loss and word finding problems, noted to have a mass lesion in his right superior temporal gyrus which was resected on 4/20 by Dr. Christella Benitez.  He had post operative intraparenchymal hemorrhage. He remains in the 4N ICU.  Pertinent  Medical History  CAD Depression Kidney stones Prostate cancer Hypertension Macular degeneration OSA on CPAP Persistent atrial fibrillation DM2 Hard of hearing, has hearing aids  Significant Hospital Events: Including procedures, antibiotic start and stop dates in addition to other pertinent events    4/20 admit, to OR for resection of GBM  4/21 IPH  4/26 intubated  4/27 return to OR for craniotomy and evacuation of SDH.  4/27 CT head > most of the large right sided hematoma resected, improvement in mass effect, 39mm midline shift, post op pneumocephalus, some blood in ventriculs, extensive white matter hypodensity consistent with chronic microvascular ischemia  4/28 sedation stopped. Cefazolin>   4/29 fent gtt off  5/1 Trach Aspirate > Prelim GNR  5/2 Extubated  5/3 On [place], mental status waxing and waning, seen by oncology yesterday.  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  Meropenem initiated  Interim History / Subjective:    No overnight events Tracking with eyes, movement of the RUE  TMax 99.31F   Objective   Blood pressure 134/86, pulse 82, temperature 99.5 F (37.5 C), temperature source Axillary, resp. rate (!) 23, height 6' (1.829 m), weight 95.8 kg, SpO2 94 %.    FiO2 (%):  [28 %] 28 %   Intake/Output Summary (Last 24 hours) at 09/15/2020 0813 Last data filed at 09/15/2020 0600 Gross per 24 hour  Intake 1649.95 ml  Output 2755 ml  Net -1105.05  ml   Filed Weights   09/11/20 0500 09/12/20 0500 09/13/20 0500  Weight: 96.1 kg 96.2 kg 95.8 kg     General:  Chronically ill-appearing M, awake though somnolent in no acute distress HEENT: MM pink/moist, nasal cannula in place, pupils equal and sclera anicteric  Neuro: opens eyes to voice, intermittent slight movement of the RUE to command, no noted purposeful movement on the L CV: s1s2 rrr, no m/r/g PULM:  Bilateral decreased air movement bilaterally  GI: soft, bsx4 active  Extremities: warm/dry, no edema  Skin: no rashes or lesions, stapled L crani site     Labs/imaging that I havepersonally reviewed  (right click and "Reselect all SmartList Selections" daily)  CBC, BMP  Resolved Hospital Problem list     Assessment & Plan:  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> evacuation on 4/26, mass effect improved Acute encephalopathy due to the above causes -Management per neurosurgery, continue Keppra and Dexamethasone -q2hr neuro checks -Neuro-oncology recommends palliative consult, if he continues to improve can consider repeat MRI to stratify for radiation/chemotherapy     Acute hypoxemic respiratory failure due to aspiration and poor airway protection MSSA aspiration PNA Trach aspirate growing Burkholderia, extubated and maintaining adequate saturations on Nasal cannula P: -Meropenem initiated, continue to closely monitor respiratory and neuro status -palliative consult, wife states that if he needed re-intubation she would like to make that decision at the time     Atrial fibrillation CAD hx CABG, hypertension Rate controlled -holding anti-coagulation -rate controlled on Metop -  holding home Losartan and Amlodipine   OSA on CPAP at home -CPAP at night if neuro status allows and able to place straps safely with crani site.  AKI> stable, improving Creatinine 1.2 today, 3L UOP after Lasix yesterday P: -continue to follow renal indices, UOP  and avoid nephrotoxins as able    Hypernatremia: iatrogenic as was on hypertonic saline Currently improved, Na 143 P: -Continue free water at 200cc Q8H -Follow up AM BMP NA level -Avoid rapid correction   Hyperglycemia BG 150-220 P: -initiate Lantus 5 units qhs -continue SSI with TF coverage     Best practice (right click and "Reselect all SmartList Selections" daily)  Diet:  Tube Feed  Pain/Anxiety/Delirium protocol (if indicated): No VAP protocol (if indicated): Not indicated DVT prophylaxis: SCD GI prophylaxis: PPI Glucose control:  SSI Yes Central venous access:  N/A Arterial line:  N/A Foley:  N/A Mobility:  OOB  PT consulted: Yes Last date of multidisciplinary goals of care discussion [Per Primary] Code Status:  full code Disposition: ICU   Critical care time: NA    Sean Carpen [name], PA-C [facility] See Amion for pager If no response to pager , please call [phone] until 7pm After 7:00 pm call Elink  [phone]?[place]

## 2020-09-15 NOTE — Progress Notes (Signed)
Patient ID: GEREMIAH FUSSELL, male   DOB: [birth year], 75 y.o.   MRN: [id number] BP 125/84   Pulse 97   Temp 98.3 F (36.8 C) (Axillary)   Resp (!) 21   Ht 6' (1.829 m)   Wt 95.8 kg   SpO2 95%   BMI 28.64 kg/m  Lethargic, purposeful, will follow some simple commands Left sided plegia, left facial droop Wound is clean, dry, no signs of infection snf recommended. Rad onc also has weighed in and will wait for discharge and further improvement before any treatment.

## 2020-09-15 NOTE — Progress Notes (Signed)
OT NOTE  RN STAFF  Please check splint every 4 hours during shift ( remove splint ) to assess for: * pain * redness *swelling  If any symptoms above present remove splint for 15 minutes. If symptoms continue - keep the splint removed and notify OT staff [phone] immediately.   Keep the UE elevated at all times on pillows / towels.  Splint should have two splint covers (blue cloth) with a mesh bag for cleaning. The splint cover (blue cloth) should be washed with soapy water and hung out to dry or washed on delicate in home washer. Do not throw away splint cover it is washable. Please have a set location to store the splints in patients room for daily application.    Splints are to be worn for 4 hours and off for 4 hours  Examples of schedule:  Splints off at 6am Splints on at 8 am Splints off at 12 pm Splints on at 2 pm Splints off at 6pm Splints on at 8 pm    To place the splint on:  1. Place the wrist in position first and secure strap 2. Position each digit and apply strap 3. The thumb and forearm strap should be applied last   The splints should fit as appeared here Strap over the PIP joint of finger Strap over the MCP ( knuckles)  joints of the hand Strap at the thumb Strap at the wrist Strap at the forearm    Fleeta Emmer, OTR/L  Acute Rehabilitation Services Pager: [phone] Office: [phone] .

## 2020-09-16 DIAGNOSIS — J189 Pneumonia, unspecified organism: Secondary | ICD-10-CM | POA: Diagnosis not present

## 2020-09-16 DIAGNOSIS — C712 Malignant neoplasm of temporal lobe: Secondary | ICD-10-CM | POA: Diagnosis not present

## 2020-09-16 DIAGNOSIS — R410 Disorientation, unspecified: Secondary | ICD-10-CM | POA: Diagnosis not present

## 2020-09-16 DIAGNOSIS — Z515 Encounter for palliative care: Secondary | ICD-10-CM | POA: Diagnosis not present

## 2020-09-16 DIAGNOSIS — D496 Neoplasm of unspecified behavior of brain: Secondary | ICD-10-CM | POA: Diagnosis not present

## 2020-09-16 DIAGNOSIS — Z7189 Other specified counseling: Secondary | ICD-10-CM | POA: Diagnosis not present

## 2020-09-16 DIAGNOSIS — J9621 Acute and chronic respiratory failure with hypoxia: Secondary | ICD-10-CM | POA: Diagnosis not present

## 2020-09-16 LAB — GLUCOSE, CAPILLARY
Glucose-Capillary: 181 mg/dL — ABNORMAL HIGH (ref 70–99)
Glucose-Capillary: 221 mg/dL — ABNORMAL HIGH (ref 70–99)
Glucose-Capillary: 201 mg/dL — ABNORMAL HIGH (ref 70–99)
Glucose-Capillary: 173 mg/dL — ABNORMAL HIGH (ref 70–99)
Glucose-Capillary: 148 mg/dL — ABNORMAL HIGH (ref 70–99)
Glucose-Capillary: 81 mg/dL (ref 70–99)

## 2020-09-16 NOTE — Progress Notes (Signed)
NAME:  [name], MRN:  [id number], DOB:  10/22/1945, LOS: 19 ADMISSION DATE:  09/01/2020, CONSULTATION DATE:  4/26 REFERRING MD:  Christella Noa, CHIEF COMPLAINT:  Inability to protect airway   History of Present Illness:  75 y/o male presented to [facility] with memory loss and word finding problems, noted to have a mass lesion in his right superior temporal gyrus which was resected on 4/20 by Dr. Christella Noa.  He had post operative intraparenchymal hemorrhage. He remains in the 4N ICU.  Pertinent  Medical History  CAD Depression Kidney stones Prostate cancer Hypertension Macular degeneration OSA on CPAP Persistent atrial fibrillation DM2 Hard of hearing, has hearing aids  Significant Hospital Events: Including procedures, antibiotic start and stop dates in addition to other pertinent events    4/20 admit, to OR for resection of GBM  4/21 IPH  4/26 intubated  4/27 return to OR for craniotomy and evacuation of SDH.  4/27 CT head > most of the large right sided hematoma resected, improvement in mass effect, 2mm midline shift, post op pneumocephalus, some blood in ventriculs, extensive white matter hypodensity consistent with chronic microvascular ischemia  4/28 sedation stopped. Cefazolin>   4/29 fent gtt off  5/1 Trach Aspirate > Prelim GNR  5/2 Extubated  5/3 On [place], mental status waxing and waning, seen by oncology yesterday.  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  Meropenem initiated  5/5 Palliative care meeting today  Interim History / Subjective:    No overnight events Less interactive this AM, otherwise stable Fever curve down-trending  Objective   Blood pressure 112/72, pulse 95, temperature 99.3 F (37.4 C), temperature source Axillary, resp. rate (!) 22, height 6' (1.829 m), weight 95.9 kg, SpO2 95 %.    FiO2 (%):  [28 %] 28 %   Intake/Output Summary (Last 24 hours) at 09/16/2020 0825 Last data filed at 09/16/2020 0800 Gross per 24 hour   Intake 2793.14 ml  Output 300 ml  Net 2493.14 ml   Filed Weights   09/12/20 0500 09/13/20 0500 09/16/20 0500  Weight: 96.2 kg 95.8 kg 95.9 kg        General:  Chronically ill-appearing M, sleeping, no distress HEENT: MM pink/moist, on nasal cannula, sclera anicteric, pupils reactive Neuro: opens eyes to sternal rub, otherwise not interactive or following commands, protecting his airway CV: s1s2 rrr, no m/r/g PULM:  Mild rhonchi bilateral bases with oxygen saturations >94% on 2L [place], no tachypnea or accessory muscle use GI: soft, bsx4 active  Extremities: warm/dry, no edema  Skin: no rashes or lesions   Labs/imaging that I havepersonally reviewed  (right click and "Reselect all SmartList Selections" daily)  glucose  Resolved Hospital Problem list     Assessment & Plan:  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> evacuation on 4/26, mass effect improved Acute encephalopathy due to the above causes -management per Neurosurgery, on Keppra and Dexamethasone per recommendations -serial neuro checks -seen by Neuro oncology, wait for evidence of improvement before considering MRI and radiation/chemo -Palliative care meeting with wife today to discuss [place]      Acute hypoxemic respiratory failure due to aspiration and poor airway protection MSSA aspiration PNA Trach aspirate growing Burkholderia, extubated and maintaining adequate saturations on Nasal cannula P: -continue Meropenem and supplemental oxygen as needed, at risk of aspiration and re-intubation secondary to mental status    Atrial fibrillation CAD hx CABG, hypertension Rate controlled P: -holding anti-coagulation in the setting of the above -continue Metoprolol -  holding Losartan and Amlodipine   OSA on CPAP at home -CPAP at night if neuro status allows and able to place straps safely with crani site.  AKI> stable, improving P: -continue to follow renal indices, UOP and avoid  nephrotoxins as able    Hypernatremia: iatrogenic as was on hypertonic saline Currently improved, Na 143 P: -Continue free water at 200cc Q8H -Follow up AM BMP NA level -Avoid rapid correction   Hyperglycemia Glucose remains above goal P: -increase to Lantus 10 units qhs -continue SSI with TF coverage     Best practice (right click and "Reselect all SmartList Selections" daily)  Diet:  Tube Feed  Pain/Anxiety/Delirium protocol (if indicated): No VAP protocol (if indicated): Not indicated DVT prophylaxis: SCD GI prophylaxis: PPI Glucose control:  SSI Yes Central venous access:  N/A Arterial line:  N/A Foley:  N/A Mobility:  OOB  PT consulted: Yes Last date of multidisciplinary goals of care discussion [Palliative meeting 5/5] Code Status:  full code Disposition: ICU   Critical care time: NA    Otilio Carpen Arsenia Goracke, PA-C [facility] See Amion for pager If no response to pager , please call [phone] until 7pm After 7:00 pm call Elink  [phone]?[place]

## 2020-09-16 NOTE — Consult Note (Signed)
Consultation Note Date: 09/16/2020   Patient Name: [name]  DOB: 1946-02-04  MRN: [id number]  Age / Sex: 75 y.o., male  PCP: Sean Battles, MD Referring Physician: Ashok Pall, MD  Reason for Consultation: Establishing goals of care  HPI/Patient Profile: 75 y.o. male  with past medical history of three vessel CAD, prostate CA, OSA on CPAP, AF on apixaban, and HTN admitted on 09/01/2020 with altered mental status. MRI 4/1 revealed mass lesion in the right superior temporal gyrus with surrounding vasogenic edema and mass effect. The patiet was referred to neurosurgery and ultimately scheduled for surgery. He was admitted to [facility] 4/20 and taken to the OR under Dr. Christella Benitez for craniotomy for tumor resection. The procedure was complicated by intraparenchymal hemorrhage. He continued to have progressive decline of his neurologic status and ultimatly required intubation in the setting of poor airway protection on 4/26. He was extubated 5/2. Mental status continues to wax and wane. Dependent on tube feeds for nourishment. PMT consulted to discuss [name].   Clinical Assessment and Goals of Care: I have reviewed medical records including EPIC notes, labs and imaging, received report from RN, assessed the patient and then met with patient's wife [name]  to discuss diagnosis prognosis, GOC, EOL wishes, disposition and options.  I introduced Palliative Medicine as specialized medical care for people living with serious illness. It focuses on providing relief from the symptoms and stress of a serious illness. The goal is to improve quality of life for both the patient and the family.  We discussed a brief life review of the patient. [name] tells me they have been married ten years. Patient was married twice before. He was married to his second wife > 30 years until she passed away. They had a son together who passed away 3 years ago. He has no other children. [name]  and his wife [name] are both retired. 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 [name] tells me they are very spiritual but not necessarily religious.   As far as functional and nutritional status, [name] tells me he had no functional or nutritional decline. The only recent changes he had were cognitive -  not being able to stay on beat with his band, stopping mid-sentence during conversation.    We discussed patient's current illness and what it means in the larger context of patient's on-going co-morbidities.  Natural disease trajectory and expectations at EOL were discussed. [name] understands gravity of situation - understands [name] tumor comes with poor prognosis. She also understands that [name] hospitalization has been very complicated and it will be very difficult for him to overcome many barriers.   I attempted to elicit values and goals of care important to the patient.  [name] tells me [name] did not often share with her about his wishes surrounding ACP/end of life.  The difference between aggressive medical intervention and comfort care was considered in light of the patient's goals of care. [name] shares how she is conflicted about moving forward- she understands how ill [name] is and does not want him to suffer/prolong suffering but she also wants to ensure [name] is given every opportunity to survive if possible - she wants more time with him. She tells me she is trying to cling to hope and does not want hope taken away.  Encouraged [name] to  consider DNR status understanding evidenced based poor outcomes in similar hospitalized patients, as the cause of the arrest is likely associated with chronic/terminal disease rather than a reversible acute cardio-pulmonary event. [name] agrees that no CPR is appropriate - will change code status to partial (NO CPR) as she is still interested  in potential intubation if needed.   Discussed with family the importance of continued conversation with family and the medical providers regarding overall plan of care and treatment options, ensuring decisions are within the context of the patient's values and GOCs.    [name] is interested in ongoing palliative support. We discussed seeing how [name] does - if continues to decline/not improve we can further discuss pursuing a full comfort path. She is agreeable to further discussion. Will f/u tomorrow.   Questions and concerns were addressed. The family was encouraged to call with questions or concerns.   Primary Decision Maker NEXT OF KIN - spouse [name]    SUMMARY OF RECOMMENDATIONS   - code status changed to limited - NO CPR - ongoing follow up - will see tomorrow AM - wife understands severity of situation but asks to continue current care and allow time for [name]'s body to respond - if continued decline or lack of improvement she is open to further discussing comfort measures only - please be mindful of conversations held in front of [name] - wife is protective of his space and worried about what he is hearing/absorbing and unable to respond/ask questions  Code Status/Advance Care Planning:  Limited code - no CPR  Psycho-social/Spiritual:   Desire for further Chaplaincy support:no  Discharge Planning: To Be Determined      Primary Diagnoses: Present on Admission: . Brain tumor ([name]) . 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 The following aspects are pertinent.  Past Medical History:  Diagnosis Date  . Anticoagulant long-term use    eliquis/ asa --- mangaed by cardiology  . Cancer [name])    prostate  . Coronary artery disease cardiologist--- Sean Merle PA   s/p cardiac cath 03-14-2019 severe 3V ;  05-27-2019  s/p  CABG x3/ clipping atrial appendage  . Depression   .  DOE (dyspnea on exertion)    05-04-2020  per pt is is much better since CABG 01/ 2021,  recovers quickly after going up flight stairs  . Feeling of incomplete bladder emptying   . Full dentures   . Heart murmur   . History of kidney stones 2022   passed  . History of prostate cancer 1998   urologist--- dr Junious Silk---  s/p radioactive seed implants 1998  . History of urethral stricture    s/p  dilatation  . Hypertension   . Incisional hernia    per pt was told by dr gerhardt, had incisional hernia at CABG incision  . Macular degeneration of both eyes   . Nephrolithiasis    right side nonobstructive per CT 05-01-2020,  also had 2 right ureter stones and on 05-04-2020 pt stated he passed 2 stones 05-03-2020  . OSA on CPAP    uses CPAP-settings 5  . Osteoarthritis (arthritis due to wear and tear of joints)   . Persistent atrial fibrillation [facility])    cardiologist-- Remer Macho PA  . Slow urinary stream   . Type 2 diabetes mellitus ([place])    followed by pcp  (05-04-2020  per pt fasting blood sugar average 98-120)  .  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 Wears hearing aid in both ears    Social History   Socioeconomic History  . Marital status: Married    Spouse name: Not on file  . Number of children: Not on file  . Years of education: Not on file  . Highest education level: Not on file  Occupational History  . Not on file  Tobacco Use  . Smoking status: Former Smoker    Years: 6.00    Types: Cigarettes    Quit date: 05/15/1973    Years since quitting: 47.3  . Smokeless tobacco: Never Used  Vaping Use  . Vaping Use: Never used  Substance and Sexual Activity  . Alcohol use: Yes    Comment: "2 cocktails twice a week",stopped 07/23/2020 instructed by PCP  . Drug use: Never  . Sexual activity: Not on file  Other Topics Concern  . Not on file  Social  History Narrative  . Not on file   Social Determinants of Health   Financial Resource Strain: Not on file  Food Insecurity: Not on file  Transportation Needs: Not on file  Physical Activity: Not on file  Stress: Not on file  Social Connections: Not on file   Family History  Problem Relation Age of Onset  . Cancer Mother   . Dementia Mother   . Colon cancer Mother   . Heart attack Sister   . Stroke Sister   . Heart attack Father   . Esophageal cancer Neg Hx   . Rectal cancer Neg Hx   . Stomach cancer Neg Hx    Scheduled Meds: . chlorhexidine  15 mL Mouth Rinse BID  . Chlorhexidine Gluconate Cloth  6 each Topical Daily  . dexamethasone (DECADRON) injection  4 mg Intravenous BID  . free water  200 mL Per Tube Q8H  . insulin aspart  0-20 Units Subcutaneous Q4H  . insulin aspart  12 Units Subcutaneous Q4H  . insulin glargine  5 Units Subcutaneous QHS  . levETIRAcetam  500 mg Per Tube BID  . mouth rinse  15 mL Mouth Rinse q12n4p  . metoprolol tartrate  25 mg Per Tube BID  . multivitamin with minerals  1 tablet Per Tube Daily  . pantoprazole sodium  40 mg Per Tube Daily  . senna-docusate  1 tablet Per Tube BID   Continuous Infusions: . sodium chloride 10 mL/hr at 09/16/20 0900  . sodium chloride    . feeding supplement (PIVOT 1.5 CAL) 1,000 mL (09/16/20 0323)  . meropenem (MERREM) IV Stopped (09/16/20 0353)   PRN Meds:.sodium chloride, acetaminophen **OR** acetaminophen, fentaNYL (SUBLIMAZE) injection, labetalol, ondansetron **OR** ondansetron (ZOFRAN) IV, senna-docusate Allergies  Allergen Reactions  . Lisinopril Cough  . Other Other (See Comments)  . Sulfa Drugs Cross Reactors     CHILDHOOD UNSPECIFIED REACTION   . Contrast Media [Iodinated Diagnostic Agents] Rash  . Doxycycline Itching  . Ioxaglate Rash  . Tetracyclines & Related Rash   Review of Systems  Unable to perform ROS: Mental status change    Physical Exam Constitutional:      General: He is not in  acute distress.    Comments: lethargic  Cardiovascular:     Rate and Rhythm: Normal rate. Rhythm irregular.  Pulmonary:     Effort: Pulmonary effort is normal.  Skin:    General: Skin is warm  and dry.  Neurological:     Mental Status: He is disoriented.     Vital Signs: BP (!) 135/96 (BP Location: Left Arm)   Pulse 80   Temp 97.8 F (36.6 C) (Axillary)   Resp (!) 21   Ht 6' (1.829 m)   Wt 95.9 kg   SpO2 100%   BMI 28.67 kg/m  Pain Scale: CPOT   Pain Score: Asleep   SpO2: SpO2: 100 % O2 Device:SpO2: 100 % O2 Flow Rate: .O2 Flow Rate (L/min): 2 L/min  IO: Intake/output summary:   Intake/Output Summary (Last 24 hours) at 09/16/2020 1033 Last data filed at 09/16/2020 0900 Gross per 24 hour  Intake 2868.14 ml  Output 300 ml  Net 2568.14 ml    LBM: Last BM Date: 09/15/20 Baseline Weight: Weight: 95.5 kg Most recent weight: Weight: 95.9 kg     Palliative Assessment/Data: PPS 20-30%   Time Total: 80 minutes Greater than 50%  of this time was spent counseling and coordinating care related to the above assessment and plan.  Juel Burrow, DNP, AGNP-C Palliative Medicine Team [phone] Pager: [phone]

## 2020-09-16 NOTE — Progress Notes (Signed)
Physical Therapy Treatment Patient Details Name: [name] MRN: [id number] DOB: 01/08/1946 Today's Date: 09/16/2020    History of Present Illness 75 yo male presented with memory loss and dysarthria. 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 Evacuation of SDH 4/27. bone flap in place Intubated 4/26-5/2.  PMH CAD depression kidney stones prostate CA HTN Macular degeneration OSA on CPAP, Afib DM2 HOH with hearing aides CABG CAD    PT Comments    The pt was seen by PT/OT and SLP to safely progress bed mobility and seated balance/activity tolerance this session. He presents with flat affect, and benefits from change in posture to increase arousal. The pt requires mod-maxA of posterior support to maintain seated stability, tolerated chest opening/stretching exercises as well as neck stretching. The pt pushes with RUE when seated, able to initiate lean to the R elbow, but unable to initiate return to neutral without facilitation at pelvis and trunk. The pt was unable to progress to OOB mobility at this time due to fatigue after seated activities, and will continue to benefit from skilled PT to progress functional strength, core stability, and activity tolerance to allow for improved mobility and decreased caregiver burden. Continue to recommend SNF.     Follow Up Recommendations  SNF     Equipment Recommendations  Wheelchair (measurements PT);Wheelchair cushion (measurements PT);Hospital bed;Other (comment) (hoyer lift)    Recommendations for Other Services       Precautions / Restrictions Precautions Precautions: Fall Precaution Comments: hearing aides, top/bottom dentures, cortrak Restrictions Weight Bearing Restrictions: No    Mobility  Bed Mobility Overal bed mobility: Needs Assistance Bed Mobility: Supine to Sit;Rolling;Sit to Sidelying Rolling: +2 for physical assistance;Total assist   Supine to  sit: Total assist;+2 for physical assistance   Sit to sidelying: Total assist;+2 for physical assistance General bed mobility comments: TotalA + 2 for supine <> sit, assist for BLE and trunk management. positioning in sidelying after sitting at EOB to perform ROM of scapula    Transfers                 General transfer comment: defer to next session when pt could demonstrate more arousal        Balance Overall balance assessment: Needs assistance Sitting-balance support: Feet supported;Single extremity supported Sitting balance-Leahy Scale: Zero Sitting balance - Comments: Pt requiring max-totalA for sitting balance, forward flexed posture with rounded shoulders, anterior and left lateral lean bias. No balance reactions noted. pt with strong R neck rotation and unable to reach full neutral midline                                    Cognition Arousal/Alertness: Lethargic Behavior During Therapy: Flat affect Overall Cognitive Status: Impaired/Different from baseline                                 General Comments: Lethargic and difficult to arouse in supine. Opening eyes once at EOB. Not visually tracking and tendency for R gaze and head turn. Poor following of one step commands. Withdrawing from painful or noxious.      Exercises General Exercises - Lower Extremity Ankle Circles/Pumps: PROM;Left;10 reps;Supine Heel Slides: PROM;Left;10 reps;Sidelying Hip ABduction/ADduction: PROM;Left;10 reps;Sidelying Other Exercises Other Exercises: In sidelying; mobilizing scapula while stabilizing shoulder joint. Performing scalupar  retraction/protraction and elevation/depression; x10. Also performing shoulder flexion with scapula and shoudlder joint stabilized; x10. Shoulder adduction/abduction with scapular and shoulder joint stabilize; x10. Other Exercises: Cervical ROM in sitting at EOB; x10. Other Exercises: lateral leans only R elbow; x5 Other  Exercises: Splint check; skin clear and no redness.    General Comments General comments (skin integrity, edema, etc.): VSS on 2L      Pertinent Vitals/Pain Pain Assessment: Faces Faces Pain Scale: Hurts little more Pain Location: generalized Pain Descriptors / Indicators: Grimacing;Discomfort Pain Intervention(s): Limited activity within patient's tolerance;Monitored during session;Repositioned           PT Goals (current goals can now be found in the care plan section) Acute Rehab PT Goals Patient Stated Goal: none stated PT Goal Formulation: With patient/family Time For Goal Achievement: 09/28/20 Potential to Achieve Goals: Fair Progress towards PT goals: Progressing toward goals    Frequency    Min 3X/week      PT Plan Current plan remains appropriate    Co-evaluation PT/OT/SLP Co-Evaluation/Treatment: Yes Reason for Co-Treatment: Complexity of the patient's impairments (multi-system involvement);To address functional/ADL transfers;For patient/therapist safety PT goals addressed during session: Mobility/safety with mobility;Balance;Strengthening/ROM OT goals addressed during session: ADL's and self-care      AM-PAC PT "6 Clicks" Mobility   Outcome Measure  Help needed turning from your back to your side while in a flat bed without using bedrails?: Total Help needed moving from lying on your back to sitting on the side of a flat bed without using bedrails?: Total Help needed moving to and from a bed to a chair (including a wheelchair)?: Total Help needed standing up from a chair using your arms (e.g., wheelchair or bedside chair)?: Total Help needed to walk in hospital room?: Total Help needed climbing 3-5 steps with a railing? : Total 6 Click Score: 6    End of Session Equipment Utilized During Treatment: Oxygen Activity Tolerance: Patient limited by lethargy;Patient limited by fatigue (no change in vitals) Patient left: in bed;with call bell/phone within  reach;with SCD's reapplied;with bed alarm set;with family/visitor present Nurse Communication: Mobility status PT Visit Diagnosis: Other abnormalities of gait and mobility (R26.89);Hemiplegia and hemiparesis Hemiplegia - Right/Left: Left Hemiplegia - dominant/non-dominant: Dominant Hemiplegia - caused by: Nontraumatic intracerebral hemorrhage     Time: 1517-6160 PT Time Calculation (min) (ACUTE ONLY): 37 min  Charges:  $Therapeutic Activity: 8-22 mins                     Karma Ganja, PT, DPT   Acute Rehabilitation Department Pager #: [phone]   Otho Bellows 09/16/2020, 2:37 PM

## 2020-09-16 NOTE — Progress Notes (Signed)
Patient ID: [name], male   DOB: 01-23-1946, 75 y.o.   MRN: [id number] BP 127/76   Pulse 100   Temp 99.2 F (37.3 C) (Axillary)   Resp (!) 22   Ht 6' (1.829 m)   Wt 95.9 kg   SpO2 98%   BMI 28.67 kg/m  Opens eyes to voice, not following commands this evening Moving right side purposefully Wound is clean, dry, no signs of infection stable

## 2020-09-16 NOTE — Progress Notes (Signed)
Occupational Therapy Treatment Patient Details Name: [name] MRN: [id number] DOB: 04-May-1946 Today's Date: 09/16/2020    History of present illness 75 yo male presented with memory loss and dysarthria. 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 Evacuation of SDH 4/27. bone flap in place Intubated 4/26-5/2.  PMH CAD depression kidney stones prostate CA HTN Macular degeneration OSA on CPAP, Afib DM2 HOH with hearing aides CABG CAD   OT comments  Upon arrival, pt supine in bed and lethargic/drowsy. Attempting to arousal pt with wet wash clothe; engaging pt on washing his face but requiring total hand over hand. Pt opening his eyes once at EOB, but not tracking therapist or following commands. Pt withdrawing to painful/noxious stimuli at BUE/BLE and during oral care at EOB. Facilitating  lateral leaning to R elbow and cervical ROM at EOB. In sidelying, facilitating scapular ROM and stabilization. Pt with two finger width subluxation at L shoulder. Reviewed elevation of LUE with wife. Continue to recommend dc to SNF and will continue to follow acutely as admitted.    Follow Up Recommendations  SNF    Equipment Recommendations  Wheelchair (measurements OT);Wheelchair cushion (measurements OT);Hospital bed;Other (comment)    Recommendations for Other Services Speech consult;Other (comment)    Precautions / Restrictions Precautions Precautions: Fall Precaution Comments: hearing aides, top/bottom dentures, cortrak       Mobility Bed Mobility Overal bed mobility: Needs Assistance Bed Mobility: Supine to Sit;Rolling;Sit to Sidelying Rolling: +2 for physical assistance;Total assist   Supine to sit: Total assist;+2 for physical assistance   Sit to sidelying: Total assist;+2 for physical assistance General bed mobility comments: TotalA + 2 for supine <> sit, assist for BLE and trunk management. positioning in  sidelying after sitting at EOB to perform ROM of scapula    Transfers                 General transfer comment: defer to next session when pt could demonstrate more arousal    Balance Overall balance assessment: Needs assistance Sitting-balance support: Feet supported;Single extremity supported Sitting balance-Leahy Scale: Zero Sitting balance - Comments: Pt requiring max-totalA for sitting balance, forward flexed posture with rounded shoulders, anterior and left lateral lean bias. No balance reactions noted. pt with strong R neck rotation and unable to reach full neutral midline                                   ADL either performed or assessed with clinical judgement   ADL Overall ADL's : Needs assistance/impaired     Grooming: Total assistance;Sitting;Wash/dry hands;Oral care Grooming Details (indicate cue type and reason): Total hand over hand to bring wash cloth to face and perform task. Pt rubbing his eye but then dropping his hand to his chest.                               General ADL Comments: total (A) for all care     Vision   Vision Assessment?: Yes Eye Alignment: Within Functional Limits Tracking/Visual Pursuits: Impaired - to be further tested in functional context   Perception     Praxis      Cognition Arousal/Alertness: Lethargic Behavior During Therapy: Flat affect Overall Cognitive Status: Impaired/Different from baseline  General Comments: Lethargic and difficult to arouse in supine. Opening eyes once at EOB. Not visually tracking and tendency for R gaze and head turn. Poor following of one step commands. Withdrawing from painful or noxious.        Exercises Exercises: Other exercises Other Exercises Other Exercises: In sidelying; mobilizing scapula while stabilizing shoulder joint. Performing scalupar retraction/protraction and elevation/depression; x10. Also performing  shoulder flexion with scapula and shoudlder joint stabilized; x10. Shoulder adduction/abduction with scapular and shoulder joint stabilize; x10. Other Exercises: Cervical ROM in sitting at EOB; x10. Other Exercises: lateral leans only R elbow; x5 Other Exercises: Splint check; skin clear and no redness.   Shoulder Instructions       General Comments VSS on 2L    Pertinent Vitals/ Pain       Pain Assessment: Faces Faces Pain Scale: Hurts little more Pain Location: Generalized (general while edge of bed) Pain Descriptors / Indicators: Grimacing;Discomfort Pain Intervention(s): Monitored during session;Repositioned;Limited activity within patient's tolerance  Home Living                                          Prior Functioning/Environment              Frequency  Min 2X/week        Progress Toward Goals  OT Goals(current goals can now be found in the care plan section)  Progress towards OT goals: Progressing toward goals  Acute Rehab OT Goals Patient Stated Goal: none stated OT Goal Formulation: Patient unable to participate in goal setting Time For Goal Achievement: 09/27/20 Potential to Achieve Goals: Good ADL Goals Additional ADL Goal #1: pt will initiate bed mobility with R UE 50% of session Additional ADL Goal #2: Pt will complete bed mobilty total +2 mod (A) as precursor to adls Additional ADL Goal #3: pt will follow 1 step command 50% of session  Plan Discharge plan remains appropriate    Co-evaluation    PT/OT/SLP Co-Evaluation/Treatment: Yes Reason for Co-Treatment: Necessary to address cognition/behavior during functional activity;For patient/therapist safety;To address functional/ADL transfers;Complexity of the patient's impairments (multi-system involvement)   OT goals addressed during session: ADL's and self-care      AM-PAC OT "6 Clicks" Daily Activity     Outcome Measure   Help from another person eating meals?: Total Help  from another person taking care of personal grooming?: Total Help from another person toileting, which includes using toliet, bedpan, or urinal?: Total Help from another person bathing (including washing, rinsing, drying)?: Total Help from another person to put on and taking off regular upper body clothing?: Total Help from another person to put on and taking off regular lower body clothing?: Total 6 Click Score: 6    End of Session Equipment Utilized During Treatment: Oxygen  OT Visit Diagnosis: Unsteadiness on feet (R26.81);Muscle weakness (generalized) (M62.81);Hemiplegia and hemiparesis Hemiplegia - Right/Left: Left Hemiplegia - dominant/non-dominant: Dominant Hemiplegia - caused by: Nontraumatic SAH   Activity Tolerance Patient tolerated treatment well;Patient limited by fatigue   Patient Left in bed;with call bell/phone within reach;with bed alarm set;with family/visitor present;with restraints reapplied   Nurse Communication Mobility status;Precautions        Time: 5176-1607 OT Time Calculation (min): 40 min  Charges: OT General Charges $OT Visit: 1 Visit OT Treatments $Self Care/Home Management : 8-22 mins $Therapeutic Activity: 8-22 mins  [place], OTR/L Acute Rehab Pager: [phone] Office:  [place] 09/16/2020, 1:22 PM

## 2020-09-16 NOTE — Progress Notes (Signed)
  Speech Language Pathology Treatment: Dysphagia;Cognitive-Linquistic  Patient Details Name: [name] MRN: [id number] DOB: Sep 25, 1945 Today's Date: 09/16/2020 Time: 1660-6301 SLP Time Calculation (min) (ACUTE ONLY): 18 min  Assessment / Plan / Recommendation Clinical Impression  ST/PT/OT intervened moving toward functional goals while sitting on edge of bed. He was very lethargic, opening eyes incompletely. No response to 1 step commands during oral care that removed dried secretions. Needed total assist for face washing, command to lean to right side and lift head. Pneumonia is likely further impacting ability to obtain an awake state. Continue oral care as often as possible. ST will continue.     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 Christella Noa.  He had post operative intraparenchymal hemorrhage and returned to OR 4/26 for craniotomy and evacuation of SDH. Intubated 4/26-5/2.      SLP Plan  Continue with current plan of care       Recommendations  Diet recommendations: NPO Medication Administration: Via alternative means                General recommendations: Rehab consult Oral Care Recommendations: Oral care QID Follow up Recommendations: Inpatient Rehab SLP Visit Diagnosis: Cognitive communication deficit (R41.841);Dysphagia, unspecified (R13.10) Plan: Continue with current plan of care                       [name] 09/16/2020, 12:06 PM  [name] Caroli.Ed Risk analyst [phone] Office [phone]

## 2020-09-17 DIAGNOSIS — D496 Neoplasm of unspecified behavior of brain: Secondary | ICD-10-CM | POA: Diagnosis not present

## 2020-09-17 DIAGNOSIS — Z515 Encounter for palliative care: Secondary | ICD-10-CM | POA: Diagnosis not present

## 2020-09-17 DIAGNOSIS — G9341 Metabolic encephalopathy: Secondary | ICD-10-CM | POA: Diagnosis not present

## 2020-09-17 DIAGNOSIS — J189 Pneumonia, unspecified organism: Secondary | ICD-10-CM | POA: Diagnosis not present

## 2020-09-17 DIAGNOSIS — I2510 Atherosclerotic heart disease of native coronary artery without angina pectoris: Secondary | ICD-10-CM

## 2020-09-17 DIAGNOSIS — Z7189 Other specified counseling: Secondary | ICD-10-CM | POA: Diagnosis not present

## 2020-09-17 DIAGNOSIS — C712 Malignant neoplasm of temporal lobe: Secondary | ICD-10-CM | POA: Diagnosis not present

## 2020-09-17 DIAGNOSIS — R739 Hyperglycemia, unspecified: Secondary | ICD-10-CM

## 2020-09-17 LAB — BASIC METABOLIC PANEL
Anion gap: 6 (ref 5–15)
BUN: 67 mg/dL — ABNORMAL HIGH (ref 8–23)
CO2: 25 mmol/L (ref 22–32)
Calcium: 8.7 mg/dL — ABNORMAL LOW (ref 8.9–10.3)
Chloride: 113 mmol/L — ABNORMAL HIGH (ref 98–111)
Creatinine, Ser: 1.38 mg/dL — ABNORMAL HIGH (ref 0.61–1.24)
GFR, Estimated: 53 mL/min — ABNORMAL LOW (ref >60)
Glucose, Bld: 138 mg/dL — ABNORMAL HIGH (ref 70–99)
Potassium: 4.5 mmol/L (ref 3.5–5.1)
Sodium: 144 mmol/L (ref 135–145)

## 2020-09-17 LAB — CBC
HCT: 40.4 % (ref 39.0–52.0)
Hemoglobin: 13.2 g/dL (ref 13.0–17.0)
MCH: 30.8 pg (ref 26.0–34.0)
MCHC: 32.7 g/dL (ref 30.0–36.0)
MCV: 94.2 fL (ref 80.0–100.0)
Platelets: 180 10*3/uL (ref 150–400)
RBC: 4.29 MIL/uL (ref 4.22–5.81)
RDW: 15.6 % — ABNORMAL HIGH (ref 11.5–15.5)
WBC: 17 10*3/uL — ABNORMAL HIGH (ref 4.0–10.5)
nRBC: 0.1 % (ref 0.0–0.2)

## 2020-09-17 LAB — GLUCOSE, CAPILLARY
Glucose-Capillary: 148 mg/dL — ABNORMAL HIGH (ref 70–99)
Glucose-Capillary: 194 mg/dL — ABNORMAL HIGH (ref 70–99)
Glucose-Capillary: 193 mg/dL — ABNORMAL HIGH (ref 70–99)
Glucose-Capillary: 212 mg/dL — ABNORMAL HIGH (ref 70–99)
Glucose-Capillary: 162 mg/dL — ABNORMAL HIGH (ref 70–99)
Glucose-Capillary: 171 mg/dL — ABNORMAL HIGH (ref 70–99)

## 2020-09-17 NOTE — Progress Notes (Signed)
NAME:  [name], MRN:  [id number], DOB:  21-Feb-1946, LOS: 7 ADMISSION DATE:  09/01/2020, CONSULTATION DATE:  4/26 REFERRING MD:  Christella Noa, CHIEF COMPLAINT:  Inability to protect airway   History of Present Illness:  75 y/o male presented to [facility] with memory loss and word finding problems, noted to have a mass lesion in his right superior temporal gyrus which was resected on 4/20 by Dr. Christella Noa.  He had post operative intraparenchymal hemorrhage. He remains in the 4N ICU.  Pertinent  Medical History  CAD Depression Kidney stones Prostate cancer Hypertension Macular degeneration OSA on CPAP Persistent atrial fibrillation DM2 Hard of hearing, has hearing aids  Significant Hospital Events: Including procedures, antibiotic start and stop dates in addition to other pertinent events    4/20 admit, to OR for resection of GBM  4/21 IPH  4/26 intubated  4/27 return to OR for craniotomy and evacuation of SDH.  4/27 CT head > most of the large right sided hematoma resected, improvement in mass effect, 46mm midline shift, post op pneumocephalus, some blood in ventriculs, extensive white matter hypodensity consistent with chronic microvascular ischemia  4/28 sedation stopped. Cefazolin>   4/29 fent gtt off  5/1 Trach Aspirate > Prelim GNR  5/2 Extubated  5/3 On [place], mental status waxing and waning, seen by oncology yesterday.  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  Meropenem initiated  5/5 Palliative care meeting today > changed to limited code  Interim History / Subjective:   No acute events overnight  Objective   Blood pressure 112/83, pulse 97, temperature 98.9 F (37.2 C), temperature source Axillary, resp. rate 20, height 6' (1.829 m), weight 88.8 kg, SpO2 99 %.        Intake/Output Summary (Last 24 hours) at 09/17/2020 1115 Last data filed at 09/17/2020 0900 Gross per 24 hour  Intake 2402.72 ml  Output 2080 ml  Net 322.72 ml   Filed  Weights   09/13/20 0500 09/16/20 0500 09/17/20 0500  Weight: 95.8 kg 95.9 kg 88.8 kg    Exam General:  Thin elderly male in NAD Neuro:  Eyes open to pain. Not following commands. Non-purposeful movement of RUE.  HEENT:  [place]/AT, No JVD noted, PERRL Cardiovascular:  RRR, no MRG Lungs:  Clear bilateral breath sounds Abdomen:  Soft, non-distended, non-tender Musculoskeletal:  No acute deformity Skin:  Intact, mucous membranes dry.   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 Culture from 4/26 with MSSA s/p 7 days treatment.  P: - continue Meropenem 5/4 > - supplemental oxygen as needed, at risk of aspiration and re-intubation secondary to mental status and airway protection concerns.  - Wife [name]), would be interested in pursuing ventilator currently, but understands it is unlikely to provide long term benefit and is considering other options.   Atrial fibrillation CAD hx CABG, hypertension Rate controlled P: -holding anti-coagulatio -  continue Metoprolol -holding Losartan and Amlodipine  OSA on CPAP at home - currently not a good candidate for QHS CPAP due to airway protection concerns. .   AKI stable, improving P: -continue to follow renal indices, UOP and avoid nephrotoxins as  able  Hypernatremia: resolved Currently improved, Na 143 P: -Continue free water at 200cc Q8H  Hyperglycemia P: -increase to Lantus 10 units qhs -continue SSI with TF coverage     Best practice (right click and "Reselect all SmartList Selections" daily)  Diet:  Tube Feed  Pain/Anxiety/Delirium protocol (if indicated): No VAP protocol (if indicated): Not indicated DVT prophylaxis: SCD GI prophylaxis: PPI Glucose control:  SSI Yes Central venous access:  N/A Arterial line:  N/A Foley:  N/A Mobility:  OOB  PT consulted: Yes Last date of multidisciplinary goals of care discussion [Palliative meeting 5/5] Code Status:  full code Disposition: ICU   Critical care time:      Georgann Housekeeper, AGACNP-BC [place] for personal pager PCCM on call pager [phone] until 7pm. Please call Elink 7p-7a. [phone]  09/17/2020 11:38 AM

## 2020-09-17 NOTE — Progress Notes (Signed)
Daily Progress Note   Patient Name: [name]       Date: 09/17/2020 DOB: 1945-06-17  Age: 75 y.o. MRN#: [id number] Attending Physician: Ashok Pall, MD Primary Care Physician: Leanna Battles, MD Admit Date: 09/01/2020  Reason for Consultation/Follow-up: Establishing goals of care  Subjective: Patient does not verbally respond or follow commands  Length of Stay: 16  Current Medications: Scheduled Meds:  . chlorhexidine  15 mL Mouth Rinse BID  . Chlorhexidine Gluconate Cloth  6 each Topical Daily  . dexamethasone (DECADRON) injection  4 mg Intravenous BID  . free water  200 mL Per Tube Q8H  . insulin aspart  0-20 Units Subcutaneous Q4H  . insulin aspart  12 Units Subcutaneous Q4H  . insulin glargine  5 Units Subcutaneous QHS  . levETIRAcetam  500 mg Per Tube BID  . mouth rinse  15 mL Mouth Rinse q12n4p  . metoprolol tartrate  25 mg Per Tube BID  . multivitamin with minerals  1 tablet Per Tube Daily  . pantoprazole sodium  40 mg Per Tube Daily  . senna-docusate  1 tablet Per Tube BID    Continuous Infusions: . sodium chloride 10 mL/hr at 09/17/20 0900  . sodium chloride    . feeding supplement (PIVOT 1.5 CAL) 1,000 mL (09/17/20 0230)  . meropenem (MERREM) IV Stopped (09/17/20 0421)    PRN Meds: sodium chloride, acetaminophen **OR** acetaminophen, fentaNYL (SUBLIMAZE) injection, labetalol, ondansetron **OR** ondansetron (ZOFRAN) IV, senna-docusate  Physical Exam Constitutional:      Comments: Lethargic, does not follow commands, moves R side extremities spontaneously  Pulmonary:     Effort: Pulmonary effort is normal.  Skin:    General: Skin is warm and dry.  Neurological:     Mental Status: He is disoriented.             Vital Signs: BP 112/83   Pulse 97   Temp 98.9 F  (37.2 C) (Axillary)   Resp 20   Ht 6' (1.829 m)   Wt 88.8 kg   SpO2 99%   BMI 26.55 kg/m  SpO2: SpO2: 99 % O2 Device: O2 Device: Room Air O2 Flow Rate: O2 Flow Rate (L/min): 2 L/min  Intake/output summary:   Intake/Output Summary (Last 24 hours) at 09/17/2020 1004 Last data filed at 09/17/2020 0900 Gross per 24 hour  Intake 2477.71 ml  Output 2080 ml  Net 397.71 ml   LBM: Last BM Date: 09/16/20 Baseline Weight: Weight: 95.5 kg Most recent weight: Weight: 88.8 kg       Palliative Assessment/Data: PPS 20-30%    Flowsheet Rows   Flowsheet Row Most Recent Value  Intake Tab   Referral Department --  [neurosurgery]  Unit at Time of Referral ICU  Palliative Care Primary Diagnosis Neurology  Date Notified 09/14/20  Palliative Care Type New Palliative care  Reason for referral Clarify Goals of Care  Date of Admission 09/01/20  Date first seen by Palliative Care 09/16/20  # of days Palliative referral response time 2 Day(s)  # of days IP prior to Palliative referral 13  Clinical Assessment   Psychosocial & Spiritual Assessment   Palliative Care Outcomes       Patient Active  Problem List   Diagnosis Date Noted  . Hypoxia   . Glioblastoma multiforme of temporal lobe ([place]) 09/07/2020  . Encounter for intubation   . HCAP (healthcare-associated pneumonia)   . Status post craniotomy 09/01/2020  . Brain tumor ([place]) 09/01/2020  . Hypertension 07/13/2020  . 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 Angina pectoris ([place])   . OSA on CPAP 11/01/2017  . Lumbar spinal stenosis 09/27/2017  . Primary osteoarthritis of left knee 08/06/2014  . Left knee DJD 08/06/2014  . Preoperative clearance 02/28/2012  . Exertional dyspnea 10/02/2011  . Atrial fibrillation ([place])   . Prostate cancer ([place])   . Palpitations   . Diabetes mellitus   . Hypercholesteremia     Palliative Care Assessment & Plan   HPI: 75 y.o. male  with past medical history of three vessel  CAD, prostate CA, OSA on CPAP, AF on apixaban, and HTN admitted on 09/01/2020 with altered mental status. MRI 4/1 revealed mass lesion in the right superior temporal gyrus with surrounding vasogenic edema and mass effect. The patiet was referred to neurosurgery and ultimately scheduled for surgery. He was admitted to [facility] 4/20 and taken to the OR under Dr. Christella Noa for craniotomy for tumor resection. The procedure was complicated by intraparenchymal hemorrhage. He continued to have progressive decline of his neurologic status and ultimatly required intubation in the setting of poor airway protection on 4/26. He was extubated 5/2. Mental status continues to wax and wane. Dependent on tube feeds for nourishment. PMT consulted to discuss [place].   Assessment: Met with wife [name] privately. We review patient's condition - [name] admits that Williams is not improving but also does not feel like he is declining either. She tells me he will do more for her (neurologically) than he will hospital staff - consistently squeezes and releases her hand to command. Recognizes her and recognized friend that visited yesterday.  We review a living will that Trinidad had completed in 2016 - this was located in his EMR. In document, Evon named [name] as his HCPOA. He also documented that he would not want his life prolonged if he had a condition that could not be cured and would result in his death within a relatively short period of time. We acknowledged that this may or may not speak exactly to our situation but does give [place] an idea about Drevion's wishes regarding his care. Document given to [facility] for her to review.   We discuss what Saylor's care should look like moving forward. We discuss that currently Juluis is receiving aggressive care and will remain in ICU/hospital to receive this care. We discuss that it may become more appropriate to transition Isiac to comfort focused care - especially if Elige continues to show no improvement/not benefit  from aggressive measures. We discuss what that care could look like and briefly discuss hospice care.   [name] is realistic and understands the situation and understands the options for [facility] care. She continues to hold out hope for some improvement.   [name] would like to take the weekend to see how Deunte does and give herself time to consider options. We made a plan to follow up Monday to continue goals of care conversations.  Recommendations/Plan:  Plan with wife to follow up Monday for ongoing [place] conversations - please call palliative team at [phone] if we are needed over the weekend  Living will completed by [name] in 2016 given to [facility] to review  Continue full scope of care with limitation of NO CPR  - wife understands severity of situation but asks to continue current care and allow time for Saud's body to respond - if continued decline or lack of improvement she is open to further discussing comfort measures only - please be mindful of conversations held in front of Corrie - wife is protective of his space and worried about what he is hearing/absorbing and unable to respond/ask questions  Code Status:  Limited code - no CPR  Discharge Planning:  To Be Determined  Care plan was discussed with wife, RN  Thank you for allowing the Palliative Medicine Team to assist in the care of this patient.   Total Time 30 minutes Prolonged Time Billed  no       Greater than 50%  of this time was spent counseling and coordinating care related to the above assessment and plan.  Juel Burrow, DNP, [facility] Palliative Medicine Team Team Phone # [phone]  Pager [phone]

## 2020-09-17 NOTE — Progress Notes (Signed)
Patient ID: [name], male   DOB: [birth year], 75 y.o.   MRN: [id number] BP 116/69   Pulse 91   Temp 98.2 F (36.8 C) (Axillary)   Resp (!) 24   Ht 6' (1.829 m)   Wt 88.8 kg   SpO2 96%   BMI 26.55 kg/m  Not opening eyes tonight, very purposeful with right side Left facial droop Wound is clean and dry Multiple medical issues being addressed, treated for pneumonia,  No problem with cpap if so desired.

## 2020-09-18 DIAGNOSIS — J189 Pneumonia, unspecified organism: Secondary | ICD-10-CM | POA: Diagnosis not present

## 2020-09-18 DIAGNOSIS — D496 Neoplasm of unspecified behavior of brain: Secondary | ICD-10-CM | POA: Diagnosis not present

## 2020-09-18 DIAGNOSIS — C712 Malignant neoplasm of temporal lobe: Secondary | ICD-10-CM | POA: Diagnosis not present

## 2020-09-18 DIAGNOSIS — Z9889 Other specified postprocedural states: Secondary | ICD-10-CM | POA: Diagnosis not present

## 2020-09-18 LAB — GLUCOSE, CAPILLARY
Glucose-Capillary: 201 mg/dL — ABNORMAL HIGH (ref 70–99)
Glucose-Capillary: 166 mg/dL — ABNORMAL HIGH (ref 70–99)
Glucose-Capillary: 100 mg/dL — ABNORMAL HIGH (ref 70–99)
Glucose-Capillary: 175 mg/dL — ABNORMAL HIGH (ref 70–99)
Glucose-Capillary: 181 mg/dL — ABNORMAL HIGH (ref 70–99)
Glucose-Capillary: 194 mg/dL — ABNORMAL HIGH (ref 70–99)

## 2020-09-18 MED ORDER — INSULIN ASPART 100 UNIT/ML ~~LOC~~ SOLN
14.0000 [IU] | SUBCUTANEOUS | Status: DC
  Administered 2020-09-18 – 2020-09-21 (×15): 14 [IU] via SUBCUTANEOUS

## 2020-09-18 NOTE — Progress Notes (Signed)
Patient ID: [name], male   DOB: Nov 22, 1945, 75 y.o.   MRN: [id number] BP (!) 130/96 (BP Location: Left Arm)   Pulse 95   Temp 97.7 F (36.5 C) (Axillary)   Resp (!) 22   Ht 6' (1.829 m)   Wt 90.1 kg   SpO2 97%   BMI 26.94 kg/m  Sitting up, follows an occasional command Moving right side well Looks much better this am.  Continue with all therapies. kc

## 2020-09-18 NOTE — Progress Notes (Signed)
NAME:  [name], MRN:  [id number], DOB:  [birth year], LOS: 76 ADMISSION DATE:  09/01/2020, CONSULTATION DATE:  4/26 REFERRING MD:  Christella Noa, CHIEF COMPLAINT:  Inability to protect airway   History of Present Illness:  75 y/o male presented to [facility] with memory loss and word finding problems, noted to have a mass lesion in his right superior temporal gyrus which was resected on 4/20 by Dr. Christella Noa.  He had post operative intraparenchymal hemorrhage. He remains in the 4N ICU.  Pertinent  Medical History  CAD Depression Kidney stones Prostate cancer Hypertension Macular degeneration OSA on CPAP Persistent atrial fibrillation DM2 Hard of hearing, has hearing aids  Significant Hospital Events: Including procedures, antibiotic start and stop dates in addition to other pertinent events    4/20 admit, to OR for resection of GBM  4/21 IPH  4/26 intubated  4/27 return to OR for craniotomy and evacuation of SDH.  4/27 CT head > most of the large right sided hematoma resected, improvement in mass effect, 49mm midline shift, post op pneumocephalus, some blood in ventriculs, extensive white matter hypodensity consistent with chronic microvascular ischemia  4/28 sedation stopped. Cefazolin>   4/29 fent gtt off  5/1 Trach Aspirate > Prelim GNR  5/2 Extubated  5/3 On [place], mental status waxing and waning, seen by oncology yesterday.  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  Meropenem initiated  5/5 Palliative care meeting today > changed to limited code  5/6 palliative care follow up - waiting to see how he does over the weekend.  Interim History / Subjective:   No overnight issues. Wife pat at bedside.  Objective   Blood pressure 126/76, pulse 81, temperature 97.8 F (36.6 C), temperature source Axillary, resp. rate (!) 24, height 6' (1.829 m), weight 90.1 kg, SpO2 96 %.        Intake/Output Summary (Last 24 hours) at 09/18/2020 0843 Last data filed at  09/18/2020 0000 Gross per 24 hour  Intake 1709.89 ml  Output 675 ml  Net 1034.89 ml   Filed Weights   09/16/20 0500 09/17/20 0500 09/18/20 0400  Weight: 95.9 kg 88.8 kg 90.1 kg    Exam General:  Thin elderly male in NAD Neuro:  Not following commands, moves RUE purposefully HEENT:  Right craniotomy incision CDI, mmm, Cortrak in place Cardiovascular:  RRR, no MRG Lungs:  Clear bilateral breath sounds, no wheezes or crackles Abdomen:  Soft, non-distended, non-tender Musculoskeletal:  No acute deformity Skin:  Intact, mucous membranes dry.   Labs/imaging that I havepersonally reviewed  (right click and "Reselect all SmartList Selections" daily)  Glucose 166, was 201 at 3am. 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 They will follow again Monday.  Acute hypoxemic respiratory failure secondary to HCAP: Trach aspirate 5/1 growing Burkholderia resistant to cefazolin. Culture from 4/26 with MSSA s/p 7 days treatment.  P: - continue Meropenem 5/4 >5/11 - currently stable on room air.  Atrial fibrillation CAD hx CABG, hypertension Rate controlled P: -holding anti-coagulation -continue Metoprolol -holding Losartan and Amlodipine, can add back as  needed  OSA on CPAP at home - currently not a good candidate for QHS CPAP due to mental status, unable to pull off bipap. Hold for now.   AKI stable,  P: -continue to follow renal indices, UOP and avoid nephrotoxins as able  Hypernatremia: resolved Currently improved, Na  144 P: -Continue free water at 200cc Q8H  Hyperglycemia P: -continue Lantus 5 units qhs -increase SSI with TF coverage   Leukocytosis Likely secondary to decadron   Best practice (right click and "Reselect all SmartList Selections" daily)  Diet:  Tube Feed  Pain/Anxiety/Delirium protocol (if indicated): No VAP protocol (if indicated): Not indicated DVT prophylaxis: SCD GI prophylaxis: PPI Glucose control:  SSI Yes Central venous access:  N/A Arterial line:  N/A Foley:  N/A Mobility:  OOB  PT consulted: Yes Last date of multidisciplinary goals of care discussion [Palliative meeting 5/5] Code Status:  full code Disposition: I think he can probably transfer out of ICU if ok with neurosurgery.  PCCM will follow as needed - please call if we can be of further assistance.   Lenice Llamas, MD Pulmonary and [place]

## 2020-09-19 LAB — GLUCOSE, CAPILLARY
Glucose-Capillary: 187 mg/dL — ABNORMAL HIGH (ref 70–99)
Glucose-Capillary: 159 mg/dL — ABNORMAL HIGH (ref 70–99)
Glucose-Capillary: 142 mg/dL — ABNORMAL HIGH (ref 70–99)
Glucose-Capillary: 188 mg/dL — ABNORMAL HIGH (ref 70–99)
Glucose-Capillary: 168 mg/dL — ABNORMAL HIGH (ref 70–99)

## 2020-09-19 MED ORDER — DEXAMETHASONE SODIUM PHOSPHATE 4 MG/ML IJ SOLN
4.0000 mg | Freq: Every day | INTRAMUSCULAR | Status: DC
  Administered 2020-09-19 – 2020-10-02 (×14): 4 mg via INTRAVENOUS
  Filled 2020-09-19 (×14): qty 1

## 2020-09-19 NOTE — Progress Notes (Signed)
Patient ID: [name], male   DOB: 1945-08-05, 75 y.o.   MRN: [id number] BP 115/80 (BP Location: Left Arm)   Pulse 90   Temp (!) 96.3 F (35.7 C) Comment: blankets applied, increased room temp  Resp 16   Ht 6' (1.829 m)   Wt 88.7 kg   SpO2 98%   BMI 26.52 kg/m  Lethargic, opens eyes to voice Moving right side well Left plegia, left facial droop Not following commands Wound is clean,dry, no signs of infection Will decrease decadron frequency

## 2020-09-20 DIAGNOSIS — Z7189 Other specified counseling: Secondary | ICD-10-CM

## 2020-09-20 DIAGNOSIS — Z515 Encounter for palliative care: Secondary | ICD-10-CM | POA: Diagnosis not present

## 2020-09-20 DIAGNOSIS — C712 Malignant neoplasm of temporal lobe: Secondary | ICD-10-CM | POA: Diagnosis not present

## 2020-09-20 LAB — GLUCOSE, CAPILLARY
Glucose-Capillary: 117 mg/dL — ABNORMAL HIGH (ref 70–99)
Glucose-Capillary: 131 mg/dL — ABNORMAL HIGH (ref 70–99)
Glucose-Capillary: 132 mg/dL — ABNORMAL HIGH (ref 70–99)
Glucose-Capillary: 145 mg/dL — ABNORMAL HIGH (ref 70–99)
Glucose-Capillary: 166 mg/dL — ABNORMAL HIGH (ref 70–99)
Glucose-Capillary: 207 mg/dL — ABNORMAL HIGH (ref 70–99)
Glucose-Capillary: 52 mg/dL — ABNORMAL LOW (ref 70–99)
Glucose-Capillary: 90 mg/dL (ref 70–99)

## 2020-09-20 MED ORDER — DEXTROSE 50 % IV SOLN
INTRAVENOUS | Status: AC
  Administered 2020-09-20: 50 mL
  Filled 2020-09-20: qty 50

## 2020-09-20 NOTE — Progress Notes (Signed)
Physical Therapy Treatment Patient Details Name: [name] MRN: [id number] DOB: 07-Sep-1945 Today's Date: 09/20/2020    History of Present Illness 75 yo male presented with memory loss and dysarthria. 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 Evacuation of SDH 4/27. bone flap in place Intubated 4/26-5/2.  PMH CAD depression kidney stones prostate CA HTN Macular degeneration OSA on CPAP, Afib DM2 HOH with hearing aides CABG CAD    PT Comments    The pt continues to present with limitations in awareness and lethargy that persist today. The pt was originally able to maintain eyes opened, especially with HOB elevated to 45 deg with chair egress position. However, no active command following or response to verbal, tactile, or noxious stimuli was noted this session. The pt tolerated PROM to BUE and BLE, and was noted to move RUE and RLE spontaneously through the initial part of the session, but became more lethargic after initial activity (~ 10 min). The pt was repositioned to avoid pressure sores and to reduce strain on L shoulder. The pt will continue to benefit from ROM and attempts to arouse and increase attention or command following with changes in position.    Follow Up Recommendations  SNF     Equipment Recommendations  Wheelchair (measurements PT);Wheelchair cushion (measurements PT);Hospital bed;Other (comment) (lift)    Recommendations for Other Services       Precautions / Restrictions Precautions Precautions: Fall Precaution Comments: hearing aides, top/bottom dentures, cortrak Restrictions Weight Bearing Restrictions: No    Mobility  Bed Mobility Overal bed mobility: Needs Assistance Bed Mobility: Rolling Rolling: +2 for physical assistance;Total assist   Supine to sit: Total assist;+2 for physical assistance     General bed mobility comments: totalA for mobility in bed, repositioning, and  required total support by bed to maintain sitting    Transfers                 General transfer comment: deferred due to limited arousal and fatigue with sitting with HOB elevated        Balance Overall balance assessment: Needs assistance   Sitting balance-Leahy Scale: Zero Sitting balance - Comments: pt requiring total support to maintain upright, unable to maintain head positioning without support or manual assist                                    Cognition Arousal/Alertness: Lethargic Behavior During Therapy: Flat affect Overall Cognitive Status: Impaired/Different from baseline Area of Impairment: Following commands;Attention                       Following Commands:  (no active following of commands)       General Comments: pt with eyes open initially, especially with use of chair egress and HOB elevated to 45 deg. Pt not demonstrating any visual tracking or ability to actively turn head to command. with strong R visual gaze preference. increased lethargy after 10 min exercises and stimulation      Exercises General Exercises - Lower Extremity Ankle Circles/Pumps: PROM;Both;10 reps;Supine Short Arc Quad: PROM;Both;10 reps;Supine Heel Slides: PROM;Both;10 reps;Supine Hip ABduction/ADduction: PROM;Both;10 reps;Supine Other Exercises Other Exercises: lateral head turns with manual support and PROM. limited to ~20 degrees cervical ROM to L. WFL to R. pt requiring assist to achieve midline    General Comments General comments (skin integrity,  edema, etc.): vss through session on RA. pt positioned with LUE supported to reduce shoulder separation and LLE in prevalon boot to reduce skin breakdown      Pertinent Vitals/Pain Pain Assessment: Faces Pain Score: 0-No pain Faces Pain Scale: No hurt Pain Location: no grimace or withdrawal even to noxious stimuli LLE or LUE Pain Intervention(s): Monitored during session           PT Goals  (current goals can now be found in the care plan section) Acute Rehab PT Goals Patient Stated Goal: max therapies (per wife) PT Goal Formulation: With patient/family Time For Goal Achievement: 09/28/20 Potential to Achieve Goals: Fair Progress towards PT goals: Not progressing toward goals - comment (lethargy)    Frequency    Min 3X/week      PT Plan Current plan remains appropriate       AM-PAC PT "6 Clicks" Mobility   Outcome Measure  Help needed turning from your back to your side while in a flat bed without using bedrails?: Total Help needed moving from lying on your back to sitting on the side of a flat bed without using bedrails?: Total Help needed moving to and from a bed to a chair (including a wheelchair)?: Total Help needed standing up from a chair using your arms (e.g., wheelchair or bedside chair)?: Total Help needed to walk in hospital room?: Total Help needed climbing 3-5 steps with a railing? : Total 6 Click Score: 6    End of Session   Activity Tolerance: Patient limited by lethargy Patient left: in bed;with call bell/phone within reach;with SCD's reapplied;with bed alarm set Nurse Communication: Mobility status PT Visit Diagnosis: Other abnormalities of gait and mobility (R26.89);Hemiplegia and hemiparesis Hemiplegia - Right/Left: Left Hemiplegia - dominant/non-dominant: Dominant Hemiplegia - caused by: Nontraumatic intracerebral hemorrhage     Time: 1453-1511 PT Time Calculation (min) (ACUTE ONLY): 18 min  Charges:  $Therapeutic Exercise: 8-22 mins                     Karma Ganja, PT, DPT   Acute Rehabilitation Department Pager #: [phone]   Otho Bellows 09/20/2020, 3:25 PM

## 2020-09-20 NOTE — Progress Notes (Signed)
Patient ID: KALEE MCCLENATHAN, male   DOB: July 04, 1945, 75 y.o.   MRN: [id number] BP 128/79 (BP Location: Left Arm)   Pulse 90   Temp 97.7 F (36.5 C) (Axillary)   Resp 19   Ht 6' (1.829 m)   Wt 88.9 kg   SpO2 97%   BMI 26.58 kg/m  Opens eyes to voice, purposeful Most likely both expressive and receptive aphasia in play Left handed, thus in this case I believe language was on the right side.  Wound is clean, and dry Looking at a snf

## 2020-09-20 NOTE — Progress Notes (Signed)
Palliative:  HPI: 75 y.o.malewith past medical history of three vessel CAD, prostate CA, OSA on CPAP, AF on apixaban, and HTNadmitted on 4/20/2022with altered mental status.MRI 4/1revealedmass lesion in the right superior temporal gyrus with surrounding vasogenic edema and mass effect. The patiet was referred to neurosurgery and ultimately scheduled for surgery. He was admitted to [facility] 4/20 and taken to the OR under Dr. Christella Benitez for craniotomy for tumor resection. The procedure was complicated by intraparenchymal hemorrhage. He continued to have progressive decline of his neurologic status and ultimatly required intubation in the setting of poor airway protection on 4/26. He was extubated 5/2. Mental status continues to wax and wane. Dependent on tube feeds for nourishment. PMT consulted to discuss [place].  I met today at [facility] bedside with wife, [name]. [name] is very lethargic but does a few times open his eyes but only briefly. Very little response. He does move right arm and scratches his face and places arm behind head with purposeful movements. He did once squeeze my hand to command and did twice move mouth with the appearance of wanting to respond verbally but no sound. 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 They would spend most days outside and maybe watch some television overnight. [name] also shares that she worries about his hearing and even with hearing aides if he can hear when she plays music or television for him - it is difficult to know for sure with him not able to tell us. Throughout our conversation [name] talks of hope for [name] to ultimately "wake up" and show some improvement although she also acknowledges that this may not happen. I did express my concern that the longer this goes without improvement the less likely it is to improve. [name] is aware of the difficult situation but still holding out hope.   All questions/concerns  addressed to best of my ability. Emotional support provided.   Exam: lethargic, minimally responsive. Breathing regular, unlabored with noted apnea at times. Abd soft. Purposeful movement of right arm. Follows command to squeeze hand (only ~10% of time - more so in the morning per wife).   Plan: - Family still hopeful for some level of improvement and more time with him.   [place], NP Palliative Medicine Team Pager [phone] (Please see [email or web address] for schedule) Team Phone [phone]    Greater than 50%  of this time was spent counseling and coordinating care related to the above assessment and plan

## 2020-09-21 DIAGNOSIS — C712 Malignant neoplasm of temporal lobe: Secondary | ICD-10-CM | POA: Diagnosis not present

## 2020-09-21 DIAGNOSIS — Z515 Encounter for palliative care: Secondary | ICD-10-CM | POA: Diagnosis not present

## 2020-09-21 DIAGNOSIS — Z7189 Other specified counseling: Secondary | ICD-10-CM | POA: Diagnosis not present

## 2020-09-21 LAB — GLUCOSE, CAPILLARY
Glucose-Capillary: 121 mg/dL — ABNORMAL HIGH (ref 70–99)
Glucose-Capillary: 122 mg/dL — ABNORMAL HIGH (ref 70–99)
Glucose-Capillary: 135 mg/dL — ABNORMAL HIGH (ref 70–99)
Glucose-Capillary: 142 mg/dL — ABNORMAL HIGH (ref 70–99)
Glucose-Capillary: 191 mg/dL — ABNORMAL HIGH (ref 70–99)
Glucose-Capillary: 245 mg/dL — ABNORMAL HIGH (ref 70–99)
Glucose-Capillary: 57 mg/dL — ABNORMAL LOW (ref 70–99)
Glucose-Capillary: 67 mg/dL — ABNORMAL LOW (ref 70–99)

## 2020-09-21 LAB — BASIC METABOLIC PANEL
Anion gap: 6 (ref 5–15)
BUN: 71 mg/dL — ABNORMAL HIGH (ref 8–23)
CO2: 23 mmol/L (ref 22–32)
Calcium: 8.6 mg/dL — ABNORMAL LOW (ref 8.9–10.3)
Chloride: 114 mmol/L — ABNORMAL HIGH (ref 98–111)
Creatinine, Ser: 1.22 mg/dL (ref 0.61–1.24)
GFR, Estimated: >60 mL/min (ref >60)
Glucose, Bld: 145 mg/dL — ABNORMAL HIGH (ref 70–99)
Potassium: 4.7 mmol/L (ref 3.5–5.1)
Sodium: 143 mmol/L (ref 135–145)

## 2020-09-21 MED ORDER — DEXTROSE 50 % IV SOLN
INTRAVENOUS | Status: AC
  Filled 2020-09-21: qty 50

## 2020-09-21 MED ORDER — DEXTROSE 50 % IV SOLN
25.0000 mL | Freq: Once | INTRAVENOUS | Status: AC
  Administered 2020-09-21: 25 mL via INTRAVENOUS

## 2020-09-21 NOTE — Progress Notes (Signed)
Occupational Therapy Treatment Patient Details Name: [name] MRN: [id number] DOB: 1945-06-14 Today's Date: 09/21/2020    History of present illness 75 yo male presented with memory loss and dysarthria. 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 Evacuation of SDH 4/27. bone flap in place Intubated 4/26-5/2.  PMH CAD depression kidney stones prostate CA HTN Macular degeneration OSA on CPAP, Afib DM2 HOH with hearing aides CABG CAD   OT comments  Pt lethargic throughout session and aroused with EOB sitting/ oral care. Pt immediately sleeping after eob sitting. Pt does not follow any commands at this time. Pt does not attempt to speak or respond with nods at all. Pt total dependence on staff at this time. Pt with risk for subluxation to L UE so positioned on pillows and wrist splint in place (small modification for fit made). Recommendations SNF.    Follow Up Recommendations  SNF    Equipment Recommendations  Wheelchair (measurements OT);Wheelchair cushion (measurements OT);Hospital bed;Other (comment)    Recommendations for Other Services Speech consult;Other (comment)    Precautions / Restrictions Precautions Precautions: Fall Precaution Comments: hearing aides, top/bottom dentures, cortrak mitten R hand Restrictions Weight Bearing Restrictions: No       Mobility Bed Mobility Overal bed mobility: Needs Assistance Bed Mobility: Rolling;Supine to Sit;Sit to Supine Rolling: +2 for physical assistance;Total assist   Supine to sit: Total assist;+2 for physical assistance Sit to supine: +2 for physical assistance;Total assist   General bed mobility comments: total (A) with all movement. pt does not initiate participation    Transfers                      Balance Overall balance assessment: Needs assistance   Sitting balance-Leahy Scale: Zero                                      ADL either performed or assessed with clinical judgement   ADL Overall ADL's : Needs assistance/impaired Eating/Feeding: NPO     Grooming Details (indicate cue type and reason): extensive oral care by SLP during sitting due to oral care needs. pt with large amounts of dried muscus removed                               General ADL Comments: total dependence at this time.     Vision       Perception     Praxis      Cognition Arousal/Alertness: Lethargic Behavior During Therapy: Flat affect Overall Cognitive Status: Impaired/Different from baseline                                 General Comments: opening eyes, does not follow any commands. pt closing eyes at time and sleeping. pt aroused with oral care but then drowzy sleeping immediately following        Exercises Other Exercises Other Exercises: facilitation for cervical rotation during session with pressure on L side and able to reach neutral   Shoulder Instructions       General Comments VSS and monitored closely during session    Pertinent Vitals/ Pain       Pain Assessment: Faces Faces Pain Scale: Hurts a little bit Pain Location:  moving away from oral care/ swatting with R hand toward mouth Pain Descriptors / Indicators: Discomfort Pain Intervention(s): Monitored during session;Repositioned  Home Living                                          Prior Functioning/Environment              Frequency  Min 2X/week        Progress Toward Goals  OT Goals(current goals can now be found in the care plan section)  Progress towards OT goals: Not progressing toward goals - comment  Acute Rehab OT Goals Patient Stated Goal: max therapies (per wife) OT Goal Formulation: Patient unable to participate in goal setting Time For Goal Achievement: 09/27/20 Potential to Achieve Goals: Good ADL Goals Additional ADL Goal #1: pt will initiate bed mobility with R  UE 50% of session Additional ADL Goal #2: Pt will complete bed mobilty total +2 mod (A) as precursor to adls Additional ADL Goal #3: pt will follow 1 step command 50% of session  Plan Discharge plan remains appropriate    Co-evaluation    PT/OT/SLP Co-Evaluation/Treatment: Yes Reason for Co-Treatment: Necessary to address cognition/behavior during functional activity;For patient/therapist safety;To address functional/ADL transfers   OT goals addressed during session: ADL's and self-care;Proper use of Adaptive equipment and DME;Strengthening/ROM      AM-PAC OT "6 Clicks" Daily Activity     Outcome Measure   Help from another person eating meals?: Total Help from another person taking care of personal grooming?: Total Help from another person toileting, which includes using toliet, bedpan, or urinal?: Total Help from another person bathing (including washing, rinsing, drying)?: Total Help from another person to put on and taking off regular upper body clothing?: Total Help from another person to put on and taking off regular lower body clothing?: Total 6 Click Score: 6    End of Session Equipment Utilized During Treatment: Oxygen  OT Visit Diagnosis: Unsteadiness on feet (R26.81);Muscle weakness (generalized) (M62.81);Hemiplegia and hemiparesis Hemiplegia - Right/Left: Left Hemiplegia - dominant/non-dominant: Dominant Hemiplegia - caused by: Nontraumatic SAH   Activity Tolerance Patient tolerated treatment well;Patient limited by fatigue   Patient Left in bed;with call bell/phone within reach;with bed alarm set;with family/visitor present;with restraints reapplied   Nurse Communication Mobility status;Precautions        Time: 9629-5284 OT Time Calculation (min): 27 min  Charges: OT General Charges $OT Visit: 1 Visit OT Treatments $Self Care/Home Management : 8-22 mins $Therapeutic Activity: 8-22 mins   [place], OTR/L  Acute Rehabilitation Services Pager:  [phone] Office: [phone] .    Jeri Modena 09/21/2020, 11:27 AM

## 2020-09-21 NOTE — Progress Notes (Signed)
Patient ID: [name], male   DOB: 08/05/1945, 75 y.o.   MRN: [id number] BP 112/84   Pulse 89   Temp 97.6 F (36.4 C) (Axillary)   Resp 19   Ht 6' (1.829 m)   Wt 88.1 kg   SpO2 97%   BMI 26.34 kg/m  Alert, is vocalizing this afternoon. Voice is raspy, weak.  Purposeful with right extremities Wound is clean, and dry Improving

## 2020-09-21 NOTE — Progress Notes (Signed)
  Speech Language Pathology Treatment: Dysphagia;Cognitive-Linquistic  Patient Details Name: [name] MRN: [id number] DOB: November 18, 1945 Today's Date: 09/21/2020 Time: 1040-1110 SLP Time Calculation (min) (ACUTE ONLY): 30 min  Assessment / Plan / Recommendation Clinical Impression  [name] needed total cues for all interaction with edge of bed sitting supported by OT. Large accumulation of dried mucous taking up most of lingual base/hyperpharyngeal space removed during 15 minute effort. Eyes half open and appeared incapable of focusing on therapist/object in line of sight. He did not follow commands and purposeful communication occurred x 1 when reached with right hand to prevent therapist during oral care. Progress towards goals has been minimal past week. Recommend continued diligent oral hygiene with NPO pt who is mouth breather and quickly accumulates secretions throughout the day.   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 Christella Noa.  He had post operative intraparenchymal hemorrhage and returned to OR 4/26 for craniotomy and evacuation of SDH. Intubated 4/26-5/2.      SLP Plan  Continue with current plan of care       Recommendations  Diet recommendations: NPO Medication Administration: Via alternative means                Oral Care Recommendations: Oral care QID Follow up Recommendations: 24 hour supervision/assistance;Skilled Nursing facility SLP Visit Diagnosis: Cognitive communication deficit (R41.841);Dysphagia, unspecified (R13.10) Plan: Continue with current plan of care                      [name] 09/21/2020, 1:22 PM  [name] Caroli.Ed Risk analyst [phone] Office [phone]

## 2020-09-21 NOTE — Progress Notes (Signed)
Palliative:  HPI: 75 y.o.malewith past medical history of three vessel CAD, prostate CA, OSA on CPAP, AF on apixaban, and HTNadmitted on 4/20/2022with altered mental status.MRI 4/1revealedmass lesion in the right superior temporal gyrus with surrounding vasogenic edema and mass effect. The patiet was referred to neurosurgery and ultimately scheduled for surgery. He was admitted to [facility] 4/20 and taken to the OR under Dr. Christella Noa for craniotomy for tumor resection. The procedure was complicated by intraparenchymal hemorrhage. He continued to have progressive decline of his neurologic status and ultimatly required intubation in the setting of poor airway protection on 4/26. He was extubated 5/2. Mental status continues to wax and wane. Dependent on tube feeds for nourishment. PMT consulted to discuss [place].  I met today at [facility] bedside with wife, [name], and their close friend. Chauncey is sleeping. He has worked with therapy this morning. I did not even attempt to awaken him today. [name] and friend share stories about [place] and common interests and able to reminisce. [name] continues to talk of hope that Chalmer will wake up. At this time I plan to follow along and offer support to [facility]. I worry at this stage and prolonged time as well as the lack of significant improvement in mental state and alertness that this is unfortunately unlikely to improve much more. I feel we will have to have further conversation in the near future to reassess Jamaal's status and his quality of life and if this would be acceptable to him. This is an unfortunate situation and we will continue to support K[place]um and [name] through this process and journey the best we are able too.   All questions/concerns addressed. Emotional support provided.   Exam: Minimally responsive at baseline. Sleeping today so I did not awaken. No distress. Purposeful movement of RUE.   Plan: - Continue current interventions as family is still hopeful for some level  of improvement.   25 min  Vinie Sill, NP Palliative Medicine Team Pager [phone] (Please see [email or web address] for schedule) Team Phone [phone]    Greater than 50%  of this time was spent counseling and coordinating care related to the above assessment and plan

## 2020-09-22 DIAGNOSIS — Z7189 Other specified counseling: Secondary | ICD-10-CM | POA: Diagnosis not present

## 2020-09-22 DIAGNOSIS — C712 Malignant neoplasm of temporal lobe: Secondary | ICD-10-CM | POA: Diagnosis not present

## 2020-09-22 DIAGNOSIS — Z515 Encounter for palliative care: Secondary | ICD-10-CM | POA: Diagnosis not present

## 2020-09-22 LAB — GLUCOSE, CAPILLARY
Glucose-Capillary: 167 mg/dL — ABNORMAL HIGH (ref 70–99)
Glucose-Capillary: 165 mg/dL — ABNORMAL HIGH (ref 70–99)
Glucose-Capillary: 207 mg/dL — ABNORMAL HIGH (ref 70–99)
Glucose-Capillary: 247 mg/dL — ABNORMAL HIGH (ref 70–99)
Glucose-Capillary: 230 mg/dL — ABNORMAL HIGH (ref 70–99)
Glucose-Capillary: 161 mg/dL — ABNORMAL HIGH (ref 70–99)

## 2020-09-22 MED ORDER — PROSOURCE TF PO LIQD
45.0000 mL | Freq: Every day | ORAL | Status: DC
  Administered 2020-09-22 – 2020-10-02 (×11): 45 mL
  Filled 2020-09-22 (×11): qty 45

## 2020-09-22 NOTE — Progress Notes (Signed)
Daily Progress Note   Patient Name: [name]       Date: 09/22/2020 DOB: 1946-03-28  Age: 75 y.o. MRN#: [id number] Attending Physician: Ashok Pall, MD Primary Care Physician: Leanna Battles, MD Admit Date: 09/01/2020  Reason for Consultation/Follow-up: Establishing goals of care  Subjective: Sleeping - flutters eyes to voice, more interactive today than prior visits - squeezes my hand, no verbal interaction  Length of Stay: 21  Current Medications: Scheduled Meds:  . chlorhexidine  15 mL Mouth Rinse BID  . Chlorhexidine Gluconate Cloth  6 each Topical Daily  . dexamethasone (DECADRON) injection  4 mg Intravenous Daily  . feeding supplement (PROSource TF)  45 mL Per Tube Daily  . free water  200 mL Per Tube Q8H  . insulin aspart  0-20 Units Subcutaneous Q4H  . insulin glargine  5 Units Subcutaneous QHS  . levETIRAcetam  500 mg Per Tube BID  . mouth rinse  15 mL Mouth Rinse q12n4p  . metoprolol tartrate  25 mg Per Tube BID  . multivitamin with minerals  1 tablet Per Tube Daily  . pantoprazole sodium  40 mg Per Tube Daily  . senna-docusate  1 tablet Per Tube BID    Continuous Infusions: . sodium chloride Stopped (09/22/20 1208)  . sodium chloride    . feeding supplement (PIVOT 1.5 CAL) 1,000 mL (09/22/20 0941)    PRN Meds: sodium chloride, acetaminophen **OR** acetaminophen, fentaNYL (SUBLIMAZE) injection, labetalol, ondansetron **OR** ondansetron (ZOFRAN) IV, senna-docusate  Physical Exam Constitutional:      Comments: Squeezes my hand to command, no eye contact, no verbal interaction, lethargic  Pulmonary:     Effort: Pulmonary effort is normal.  Skin:    General: Skin is warm and dry.  Neurological:     Mental Status: He is disoriented.             Vital Signs: BP (!)  115/102   Pulse 97   Temp (!) 96.8 F (36 C) (Axillary)   Resp 18   Ht 6' (1.829 m)   Wt 88.2 kg   SpO2 93%   BMI 26.37 kg/m  SpO2: SpO2: 93 % O2 Device: O2 Device: Room Air O2 Flow Rate: O2 Flow Rate (L/min): 2 L/min  Intake/output summary:   Intake/Output Summary (Last 24 hours) at 09/22/2020 1524 Last data filed at 09/22/2020 1400 Gross per 24 hour  Intake 2717.64 ml  Output 2625 ml  Net 92.64 ml   LBM: Last BM Date: (P) 09/19/20 Baseline Weight: Weight: 95.5 kg Most recent weight: Weight: 88.2 kg       Palliative Assessment/Data: PPS 20-30%    Flowsheet Rows   Flowsheet Row Most Recent Value  Intake Tab   Referral Department --  [neurosurgery]  Unit at Time of Referral ICU  Palliative Care Primary Diagnosis Neurology  Date Notified 09/14/20  Palliative Care Type New Palliative care  Reason for referral Clarify Goals of Care  Date of Admission 09/01/20  Date first seen by Palliative Care 09/16/20  # of days Palliative referral response time 2 Day(s)  # of days IP prior to Palliative referral 13  Clinical Assessment   Psychosocial & Spiritual Assessment   Palliative Care  Outcomes       Patient Active Problem List   Diagnosis Date Noted  . Hypoxia   . 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 Encounter for intubation   . HCAP (healthcare-associated pneumonia)   . Status post craniotomy 09/01/2020  . Brain tumor ([place]) 09/01/2020  . Hypertension 07/13/2020  . 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 Angina pectoris ([place])   . OSA on CPAP 11/01/2017  . Lumbar spinal stenosis 09/27/2017  . Primary osteoarthritis of left knee 08/06/2014  . Left knee DJD 08/06/2014  . Preoperative clearance 02/28/2012  . Exertional dyspnea 10/02/2011  . Atrial fibrillation ([place])   . Prostate cancer ([place])   . Palpitations   . Diabetes mellitus   . Hypercholesteremia     Palliative Care Assessment & Plan   HPI: 75 y.o. male   with past medical history of three vessel CAD, prostate CA, OSA on CPAP, AF on apixaban, and HTN admitted on 09/01/2020 with altered mental status. MRI 4/1 revealed mass lesion in the right superior temporal gyrus with surrounding vasogenic edema and mass effect. The patiet was referred to neurosurgery and ultimately scheduled for surgery. He was admitted to [facility] 4/20 and taken to the OR under Dr. Christella Noa for craniotomy for tumor resection. The procedure was complicated by intraparenchymal hemorrhage. He continued to have progressive decline of his neurologic status and ultimatly required intubation in the setting of poor airway protection on 4/26. He was extubated 5/2. Mental status continues to wax and wane. Dependent on tube feeds for nourishment. PMT consulted to discuss [place].   Assessment: Brief follow up with wife [name] and other family member at bedside. [name] reports more interaction today - [name] reached for her hand independently. He also stroked her face. He recognized other family member that walked in. [name] is very pleased with these interactions with [name]. We did not further discuss goals of care today as timing did not seem appropriate. She is aware of our continued involvement and open to further conversations.   Recommendations/Plan: Will continue to follow   Living will completed by [name] in 2016 given to [facility] to review  Continue full scope of care with limitation of NO CPR  wife understands severity of situation but asks to continue current care and allow time for [name]'s body to respond - if continued decline or lack of improvement she is open to further discussing comfort measures only  please be mindful of conversations held in front of [name] - wife is protective of his space and worried about what he is hearing/absorbing and unable to respond/ask questions  Code Status:  Limited code - no CPR  Discharge Planning:  To Be Determined  Care plan was discussed with  wife, RN  Thank you for allowing the Palliative Medicine Team to assist in the care of this patient.   Total Time 20 minutes Prolonged Time Billed  no       Greater than 50%  of this time was spent counseling and coordinating care related to the above assessment and plan  Juel Burrow, DNP, [facility] Palliative Medicine Team Team Phone # [phone]  Pager [phone]

## 2020-09-22 NOTE — Progress Notes (Signed)
Physical Therapy Treatment Patient Details Name: [name] MRN: [id number] DOB: 1945-11-05 Today's Date: 09/22/2020    History of Present Illness 75 yo male presented 4/20 with memory loss and dysarthria. 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 Evacuation of SDH 4/27. bone flap in place Intubated 4/26-5/2.  PMH CAD depression kidney stones prostate CA HTN Macular degeneration OSA on CPAP, Afib DM2 HOH with hearing aides CABG CAD    PT Comments    Pt was more restless and awake upon arrival this date, spontaneously moving his R UE and lower extremity. No activation noted on the L. Pt possibly beginning to follow simple commands, but unsure whether it was intentional or coincidental as it was inconsistent. Thus, pt continuing to need TAx2 for bed mobility. Focused on sitting balance, placing pt sitting propped on R elbow to discourage noted pushing syndrome, and placed pt sitting with R elbow on R thigh to discourage his posterior bias. However, pt needing mod-TA for sitting balance at this time. Pt did attempt to verbalize this date, mouthing "yeah" and "what?" when his name was stated on occasion. Pt continuing to display R gaze with no tracking and L side neglect. Educated pt's family on continuing to re-orient pt, address pt with simple commands and allow time for response, and encourage L side attention through sitting towards his L as far he can be successful. Will continue to follow acutely. Current recommendations remain appropriate.    Follow Up Recommendations  SNF;Supervision/Assistance - 24 hour     Equipment Recommendations  Wheelchair (measurements PT);Wheelchair cushion (measurements PT);Hospital bed;Other (comment) (lift)    Recommendations for Other Services       Precautions / Restrictions Precautions Precautions: Fall Precaution Comments: hearing aides, top/bottom dentures, cortrak, mitten  R hand, L neglect, pushing syndrome Restrictions Weight Bearing Restrictions: No    Mobility  Bed Mobility Overal bed mobility: Needs Assistance Bed Mobility: Rolling;Supine to Sit;Sit to Supine Rolling: +2 for physical assistance;Total assist;+2 for safety/equipment   Supine to sit: Total assist;+2 for physical assistance;+2 for safety/equipment;HOB elevated Sit to supine: +2 for physical assistance;Total assist;+2 for safety/equipment   General bed mobility comments: Pt with spontaneous moving of R UE and leg, but not following cues to assist with bed mobility, thus TA for all.    Transfers                 General transfer comment: Deferred for safety due to inconsistency in following commands  Ambulation/Gait             General Gait Details: Deferred for safety   Stairs             Wheelchair Mobility    Modified Rankin (Stroke Patients Only) Modified Rankin (Stroke Patients Only) Pre-Morbid Rankin Score: No symptoms Modified Rankin: Severe disability     Balance Overall balance assessment: Needs assistance Sitting-balance support: Feet supported;Single extremity supported Sitting balance-Leahy Scale: Zero Sitting balance - Comments: Pt with posterior bias and pushing tendency off R UE towards L. Pt needing mod-TA to maintain static sitting balance EOB, even in propped sitting onto R elbow and with placement of R elbow on thigh to encourage anterior lean. Postural control: Posterior lean;Left lateral lean                                  Cognition Arousal/Alertness: Lethargic (Awake  at start but became more lethargic as session progressed with pt falling asleep once supine end of session) Behavior During Therapy: Flat affect Overall Cognitive Status: Impaired/Different from baseline Area of Impairment: Following commands;Attention;Safety/judgement;Awareness                   Current Attention Level: Divided   Following  Commands: Follows one step commands inconsistently;Follows one step commands with increased time Safety/Judgement: Decreased awareness of safety;Decreased awareness of deficits Awareness: Intellectual   General Comments: Pt awake and restless in bed upon arrival, but became more lethargic as session progressed with pt falling asleep once back in supine at end of session. Pt with R eye gaze, not tracking or making eye contact throughout. Intermittent attempt to follow simple one-step commands after increased time, but unsure whether intentional or coincidental. Pt with pushing tendency at EOB, indicating poor spatial awareness or awareness of deficits.      Exercises Other Exercises Other Exercises: PROM to L ankle into dorsiflexion 3x, supine    General Comments General comments (skin integrity, edema, etc.): Positioned pt end of session with UEs supported on pillows and towel roll under R side of head to encourage L rotation; educated pt's wife and grandson extensively on speaking loud, stating his name, then stating a simple command and allowing him time to respond. Also, educated them on encouraging eye contact and tracking along with L side awareness through positioning themselves towards his L as far as he can be successful      Pertinent Vitals/Pain Pain Assessment: Faces Faces Pain Scale: Hurts a little bit Pain Location: with noxious stimuli to L leg Pain Descriptors / Indicators: Grimacing Pain Intervention(s): Limited activity within patient's tolerance;Monitored during session;Repositioned    Home Living                      Prior Function            PT Goals (current goals can now be found in the care plan section) Acute Rehab PT Goals Patient Stated Goal: max therapies (per wife) PT Goal Formulation: With patient/family Time For Goal Achievement: 09/28/20 Potential to Achieve Goals: Fair Progress towards PT goals: Progressing toward goals    Frequency     Min 3X/week      PT Plan Current plan remains appropriate    Co-evaluation              AM-PAC PT "6 Clicks" Mobility   Outcome Measure  Help needed turning from your back to your side while in a flat bed without using bedrails?: Total Help needed moving from lying on your back to sitting on the side of a flat bed without using bedrails?: Total Help needed moving to and from a bed to a chair (including a wheelchair)?: Total Help needed standing up from a chair using your arms (e.g., wheelchair or bedside chair)?: Total Help needed to walk in hospital room?: Total Help needed climbing 3-5 steps with a railing? : Total 6 Click Score: 6    End of Session   Activity Tolerance: Patient limited by lethargy;Other (comment) (limited by ability to follow commands) Patient left: in bed;with call bell/phone within reach;with bed alarm set;with restraints reapplied   PT Visit Diagnosis: Other abnormalities of gait and mobility (R26.89);Hemiplegia and hemiparesis;Muscle weakness (generalized) (M62.81);Difficulty in walking, not elsewhere classified (R26.2);Other symptoms and signs involving the nervous system (R29.898) Hemiplegia - Right/Left: Left Hemiplegia - dominant/non-dominant: Dominant Hemiplegia - caused by: Nontraumatic intracerebral hemorrhage  Time: 9604-5409 PT Time Calculation (min) (ACUTE ONLY): 25 min  Charges:  $Therapeutic Activity: 8-22 mins $Neuromuscular Re-education: 8-22 mins                     Moishe Spice, PT, DPT Acute Rehabilitation Services  Pager: [phone] Office: [place] 09/22/2020, 2:48 PM

## 2020-09-22 NOTE — Progress Notes (Signed)
Nutrition Follow-up  DOCUMENTATION CODES:   Not applicable  INTERVENTION:   Tube feeding via Cortrak: Pivot 1.5 at 65 ml/h (1560 ml per day) Add 45 ml ProSource TF daily  Provides 2380 kcal, 157 gm protein, 1184 ml free water daily  200 ml free water every 8 hours Total free water: 1784 ml   MVI with minerals daily  NUTRITION DIAGNOSIS:   Increased nutrient needs related to post-op healing as evidenced by estimated needs. Ongoing.   GOAL:   Patient will meet greater than or equal to 90% of their needs Met with TF.   MONITOR:   TF tolerance  REASON FOR ASSESSMENT:   Consult Enteral/tube feeding initiation and management  ASSESSMENT:   Pt with PMH of HTN, depression, OA, OSA on CPAP, DM, CAD s/p CABG x 3 05/2019, incisional hernia admitted for R temporal lobe lesion plan for resection of mass.   Pt discussed during ICU rounds and with RN.  Weight has varied from 88-96 kg, stable at 88 kg over last week. Per SLP pt not ready for trials of PO, pt does not follow commands and has no purposeful communications. Pt is a mouth breather and quickly accumulates secretions throughout the day. Palliative care following for [place].   4/20 s/p R craniotomy for tumor  4/21 pt with new L sided weakness and lethargy and R gaze preference, stat CT shows new large hemorrhage deep in resection cavity with midline shift 4/22 Cortrak placed; tip gastric 4/26 s/p craniotomy for hematoma evacuation; returned intubated 5/2 extubated   Medications reviewed and include: decadron, SSI, lantus, MVI with minerals, protonix, senokot-s  Labs reviewed:  CBG's: 135-245      Diet Order:   Diet Order            Diet NPO time specified  Diet effective now                 EDUCATION NEEDS:   No education needs have been identified at this time  Skin:  Skin Assessment: Reviewed RN Assessment  Last BM:  5/8 large  Height:   Ht Readings from Last 1 Encounters:  09/07/20 6' (1.829 m)     Weight:   Wt Readings from Last 1 Encounters:  09/22/20 88.2 kg    Ideal Body Weight:  80.9 kg  BMI:  Body mass index is 26.37 kg/m.  Estimated Nutritional Needs:   Kcal:  2200-2400  Protein:  120-140 grams  Fluid:  >2 L/day  Lockie Pares., RD, LDN, CNSC See AMiON for contact information

## 2020-09-22 NOTE — Progress Notes (Signed)
Patient ID: [name], male   DOB: 1945-09-08, 75 y.o.   MRN: [id number] Opens eyes to minimal stimulation Not speaking tonight, nor following commands Moving right side purposefully BP 120/82   Pulse 92   Temp 97.8 F (36.6 C) (Axillary)   Resp (!) 22   Ht 6' (1.829 m)   Wt 88.2 kg   SpO2 92%   BMI 26.37 kg/m  Waxes and wanes neurologically

## 2020-09-23 DIAGNOSIS — Z7189 Other specified counseling: Secondary | ICD-10-CM | POA: Diagnosis not present

## 2020-09-23 DIAGNOSIS — Z515 Encounter for palliative care: Secondary | ICD-10-CM | POA: Diagnosis not present

## 2020-09-23 DIAGNOSIS — C712 Malignant neoplasm of temporal lobe: Secondary | ICD-10-CM | POA: Diagnosis not present

## 2020-09-23 LAB — GLUCOSE, CAPILLARY
Glucose-Capillary: 161 mg/dL — ABNORMAL HIGH (ref 70–99)
Glucose-Capillary: 159 mg/dL — ABNORMAL HIGH (ref 70–99)
Glucose-Capillary: 199 mg/dL — ABNORMAL HIGH (ref 70–99)
Glucose-Capillary: 247 mg/dL — ABNORMAL HIGH (ref 70–99)
Glucose-Capillary: 201 mg/dL — ABNORMAL HIGH (ref 70–99)

## 2020-09-23 NOTE — Progress Notes (Signed)
Physical Therapy Treatment Patient Details Name: [name] MRN: [id number] DOB: 1945/11/22 Today's Date: 09/23/2020    History of Present Illness 75 yo male presented 4/20 with memory loss and dysarthria. 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 Evacuation of SDH 4/27. bone flap in place Intubated 4/26-5/2.  PMH CAD depression kidney stones prostate CA HTN Macular degeneration OSA on CPAP, Afib DM2 HOH with hearing aides CABG CAD    PT Comments    Pt is demonstrating improved attention to his name and consistency with following simple single-step commands, up to ~25% this date. Pt following commands to lift head, rotate head, squeeze R hand, and lift R hand on occasion. Pt continues to require increased time to process and respond to tasks, sometimes up to ~15 seconds. Pt with poor awareness into his deficits and safety, not demonstrating any reactional strategies with posterior LOB sitting EOB. Pt continues to show pushing syndrome with his R UE, but this is improved through his R hand being placed on his lap. His sitting balance also improves in this position, with him occasionally only needing min-modA for brief periods, but up to TA other times. Unable to successfully clear buttocks to come to stand this date with TAx2 and no activation noted by pt. Completed session with AROM/AAROM/PROM to his neck, UEs, and lower extremities to encourage strengthening and increased ROM. Will continue to follow acutely. Current recommendations remain appropriate.   Follow Up Recommendations  SNF;Supervision/Assistance - 24 hour     Equipment Recommendations  Wheelchair (measurements PT);Wheelchair cushion (measurements PT);Hospital bed;Other (comment) (lift)    Recommendations for Other Services       Precautions / Restrictions Precautions Precautions: Fall Precaution Comments: hearing aides, top/bottom dentures,  cortrak, mitten R hand, L neglect, pushing syndrome Restrictions Weight Bearing Restrictions: No    Mobility  Bed Mobility Overal bed mobility: Needs Assistance Bed Mobility: Supine to Sit;Sit to Supine     Supine to sit: Total assist;+2 for physical assistance;+2 for safety/equipment;HOB elevated Sit to supine: +2 for physical assistance;Total assist;+2 for safety/equipment   General bed mobility comments: Pt with spontaneous moving of R UE and leg, but not following cues to assist with bed mobility, thus TAx2 for all.    Transfers Overall transfer level: Needs assistance Equipment used: 2 person hand held assist Transfers: Sit to/from Stand Sit to Stand: Total assist;+2 physical assistance;+2 safety/equipment         General transfer comment: Attempted sit to stand from EOB with bil HHA and use of bed pads to facilitate buttocks clearance and bil knee block, but unsuccessful in clearing buttocks with TAx2. No activation noted by pt to assist in transfer.  Ambulation/Gait             General Gait Details: Unable to stand at this time   Stairs             Wheelchair Mobility    Modified Rankin (Stroke Patients Only) Modified Rankin (Stroke Patients Only) Pre-Morbid Rankin Score: No symptoms Modified Rankin: Severe disability     Balance Overall balance assessment: Needs assistance Sitting-balance support: Feet supported;Single extremity supported Sitting balance-Leahy Scale: Poor Sitting balance - Comments: Pt with posterior bias and pushing tendency off R UE towards L. Pt needing up to TA posteriorly to maintain static sitting balance EOB with moments of as little as min-modA when his R hand was resting on his lap. No reactional strategies noted to  recover balance with moments of letting pt's trunk fall posteriorly towards bed, catching pt at end for safety, x4 attempts. Postural control: Posterior lean;Left lateral lean     Standing balance comment:  Unable to stand at this time                            Cognition Arousal/Alertness: Lethargic (Awake at start but became more lethargic as session progressed with pt falling asleep once supine end of session) Behavior During Therapy: Flat affect Overall Cognitive Status: Impaired/Different from baseline Area of Impairment: Following commands;Attention;Safety/judgement;Awareness                   Current Attention Level: Divided   Following Commands: Follows one step commands inconsistently;Follows one step commands with increased time (~25%) Safety/Judgement: Decreased awareness of safety;Decreased awareness of deficits Awareness: Intellectual   General Comments: Pt became more lethargic as session progressed with pt falling asleep once back in supine at end of session. Pt with R eye gaze, not tracking but did attempt to look up when his name was stated on occasion. Pt more consistent with following single-step commands, ~25% this date. Pt squeezing R hand, nodding, attempting to lift head, assist with turn head to L, and lifting R arm when cued. Pt did not follow cues for leg movement. Pt with pushing tendency at EOB, indicating poor spatial awareness or awareness of deficits.      Exercises General Exercises - Upper Extremity Shoulder Flexion: AROM;PROM;Right;Supine (x1 partial AROM with hand lifting off bed, x4 PROM) Elbow Flexion: Right;PROM;10 reps;Supine Elbow Extension: Right;PROM;10 reps;Supine Wrist Flexion: PROM;Both;5 reps;Supine Wrist Extension: PROM;Both;5 reps;Supine Digit Composite Flexion: PROM;5 reps;Both;Supine (x3 AROM to make fist with R) Composite Extension: PROM;Both;5 reps;Supine General Exercises - Lower Extremity Ankle Circles/Pumps: PROM;Both;10 reps;Supine Heel Slides: PROM;Both;Supine;5 reps Other Exercises Other Exercises: PROM/AAROM to rotate neck to L, x~7 reps    General Comments General comments (skin integrity, edema, etc.):  Positioned pt end of session with L UE supported on pillow and towel roll under R side of head to encourage L rotation along with R heel floating and L bunny boot donned      Pertinent Vitals/Pain Pain Assessment: Faces Faces Pain Scale: Hurts a little bit Pain Location: with noxious stimuli to L leg Pain Descriptors / Indicators: Grimacing Pain Intervention(s): Limited activity within patient's tolerance;Monitored during session;Repositioned    Home Living                      Prior Function            PT Goals (current goals can now be found in the care plan section) Acute Rehab PT Goals Patient Stated Goal: max therapies (per wife) PT Goal Formulation: With patient/family Time For Goal Achievement: 09/28/20 Potential to Achieve Goals: Fair Progress towards PT goals: Progressing toward goals    Frequency    Min 3X/week      PT Plan Current plan remains appropriate    Co-evaluation              AM-PAC PT "6 Clicks" Mobility   Outcome Measure  Help needed turning from your back to your side while in a flat bed without using bedrails?: Total Help needed moving from lying on your back to sitting on the side of a flat bed without using bedrails?: Total Help needed moving to and from a bed to a chair (including a wheelchair)?: Total  Help needed standing up from a chair using your arms (e.g., wheelchair or bedside chair)?: Total Help needed to walk in hospital room?: Total Help needed climbing 3-5 steps with a railing? : Total 6 Click Score: 6    End of Session Equipment Utilized During Treatment: Gait belt Activity Tolerance: Patient limited by lethargy;Other (comment) (limited by ability to follow commands) Patient left: in bed;with call bell/phone within reach;with bed alarm set;with restraints reapplied Nurse Communication: Mobility status;Other (comment) (following some commands) PT Visit Diagnosis: Other abnormalities of gait and mobility  (R26.89);Hemiplegia and hemiparesis;Muscle weakness (generalized) (M62.81);Difficulty in walking, not elsewhere classified (R26.2);Other symptoms and signs involving the nervous system (R29.898) Hemiplegia - Right/Left: Left Hemiplegia - dominant/non-dominant: Dominant Hemiplegia - caused by: Nontraumatic intracerebral hemorrhage     Time: 4503-8882 PT Time Calculation (min) (ACUTE ONLY): 28 min  Charges:  $Therapeutic Exercise: 8-22 mins $Neuromuscular Re-education: 8-22 mins                     Moishe Spice, PT, DPT Acute Rehabilitation Services  Pager: [phone] Office: [place] 09/23/2020, 5:26 PM

## 2020-09-23 NOTE — Progress Notes (Signed)
Daily Progress Note   Patient Name: [name]       Date: 09/23/2020 DOB: Jul 02, 1945  Age: 75 y.o. MRN#: [id number] Attending Physician: Ashok Pall, MD Primary Care Physician: Leanna Battles, MD Admit Date: 09/01/2020  Reason for Consultation/Follow-up: Establishing goals of care  Subjective: Much less interactive today. Wife at bedside.  Length of Stay: 22  Current Medications: Scheduled Meds:  . chlorhexidine  15 mL Mouth Rinse BID  . Chlorhexidine Gluconate Cloth  6 each Topical Daily  . dexamethasone (DECADRON) injection  4 mg Intravenous Daily  . feeding supplement (PROSource TF)  45 mL Per Tube Daily  . free water  200 mL Per Tube Q8H  . insulin aspart  0-20 Units Subcutaneous Q4H  . insulin glargine  5 Units Subcutaneous QHS  . levETIRAcetam  500 mg Per Tube BID  . mouth rinse  15 mL Mouth Rinse q12n4p  . metoprolol tartrate  25 mg Per Tube BID  . multivitamin with minerals  1 tablet Per Tube Daily  . pantoprazole sodium  40 mg Per Tube Daily  . senna-docusate  1 tablet Per Tube BID    Continuous Infusions: . sodium chloride 10 mL/hr at 09/23/20 0800  . sodium chloride    . feeding supplement (PIVOT 1.5 CAL) 1,000 mL (09/23/20 0600)    PRN Meds: sodium chloride, acetaminophen **OR** acetaminophen, fentaNYL (SUBLIMAZE) injection, labetalol, ondansetron **OR** ondansetron (ZOFRAN) IV, senna-docusate  Physical Exam Constitutional:      Comments: Does not respond to me today   Pulmonary:     Effort: Pulmonary effort is normal.  Skin:    General: Skin is warm and dry.             Vital Signs: BP 114/67   Pulse 87   Temp 98.2 F (36.8 C) (Axillary)   Resp 17   Ht 6' (1.829 m)   Wt 88.7 kg   SpO2 95%   BMI 26.52 kg/m  SpO2: SpO2: 95 % O2 Device: O2 Device: Room  Air O2 Flow Rate: O2 Flow Rate (L/min): 2 L/min  Intake/output summary:   Intake/Output Summary (Last 24 hours) at 09/23/2020 1046 Last data filed at 09/23/2020 0800 Gross per 24 hour  Intake 2027.06 ml  Output 1500 ml  Net 527.06 ml   LBM: Last BM Date: 09/22/20 Baseline Weight: Weight: 95.5 kg Most recent weight: Weight: 88.7 kg       Palliative Assessment/Data: PPS 20-30%    Flowsheet Rows   Flowsheet Row Most Recent Value  Intake Tab   Referral Department --  [neurosurgery]  Unit at Time of Referral ICU  Palliative Care Primary Diagnosis Neurology  Date Notified 09/14/20  Palliative Care Type New Palliative care  Reason for referral Clarify Goals of Care  Date of Admission 09/01/20  Date first seen by Palliative Care 09/16/20  # of days Palliative referral response time 2 Day(s)  # of days IP prior to Palliative referral 13  Clinical Assessment   Psychosocial & Spiritual Assessment   Palliative Care Outcomes       Patient Active Problem List   Diagnosis Date Noted  . Hypoxia   . 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  Encounter for intubation   . HCAP (healthcare-associated pneumonia)   . Status post craniotomy 09/01/2020  . Brain tumor ([name]) 09/01/2020  . Hypertension 07/13/2020  . 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 Angina pectoris ([name])   . OSA on CPAP 11/01/2017  . Lumbar spinal stenosis 09/27/2017  . Primary osteoarthritis of left knee 08/06/2014  . Left knee DJD 08/06/2014  . Preoperative clearance 02/28/2012  . Exertional dyspnea 10/02/2011  . Atrial fibrillation ([name])   . Prostate cancer ([name])   . Palpitations   . Diabetes mellitus   . Hypercholesteremia     Palliative Care Assessment & Plan   HPI: 75 y.o. male  with past medical history of three vessel CAD, prostate CA, OSA on CPAP, AF on apixaban, and HTN admitted on 09/01/2020 with altered mental status. MRI 4/1 revealed mass lesion in the  right superior temporal gyrus with surrounding vasogenic edema and mass effect. The patiet was referred to neurosurgery and ultimately scheduled for surgery. He was admitted to [facility] 4/20 and taken to the OR under Dr. Christella Noa for craniotomy for tumor resection. The procedure was complicated by intraparenchymal hemorrhage. He continued to have progressive decline of his neurologic status and ultimatly required intubation in the setting of poor airway protection on 4/26. He was extubated 5/2. Mental status continues to wax and wane. Dependent on tube feeds for nourishment. PMT consulted to discuss [place].   Assessment: Brief follow up with wife [name]. She acknowledges that patient is much less interactive today - very different from yesterday. He continues to wax/wane. At beginning of our conversation family friend came by to visit so we did not discuss goals of care further. [name] is leaving early today for some personal errands. I discussed with her the need to further discuss goals of care and options moving further. She agrees this is appropriate and is agreeable to meeting with me in AM.   Recommendations/Plan: Planning for [name] discussion with wife in AM   Living will completed by [name] in 2016 given to [facility] to review  Continue full scope of care with limitation of NO CPR  wife understands severity of situation but asks to continue current care and allow time for [name]'s body to respond - if continued decline or lack of improvement she is open to further discussing comfort measures only  please be mindful of conversations held in front of [name] - wife is protective of his space and worried about what he is hearing/absorbing and unable to respond/ask questions  Code Status:  Limited code - no CPR  Discharge Planning:  To Be Determined  Care plan was discussed with wife, RN  Thank you for allowing the Palliative Medicine Team to assist in the care of this patient.   Total Time 20 minutes  Prolonged Time Billed  no       Greater than 50%  of this time was spent counseling and coordinating care related to the above assessment and plan.   Juel Burrow, DNP, [facility] Palliative Medicine Team Team Phone # [phone]  Pager [phone]

## 2020-09-23 NOTE — Progress Notes (Signed)
Patient ID: [name], male   DOB: 13-Nov-1945, 75 y.o.   MRN: [id number]  BP 99/66   Pulse 84   Temp 97.6 F (36.4 C) (Axillary)   Resp 18   Ht 6' (1.829 m)   Wt 88.7 kg   SpO2 97%   BMI 26.52 kg/m  Eyes open, tracking  Moving right side well Not following commands Exam unchanged. Wound is clean, dry, no signs of infection

## 2020-09-23 NOTE — Progress Notes (Signed)
   09/23/20 1504  Clinical Encounter Type  Visited With Family  Visit Type Initial  Referral From Nurse  Consult/Referral To Chaplain   Chaplain responded to referral from unit staff. No family was at bedside. Chaplain will follow-up at a later time.   This note was prepared by Chaplain Resident, Dante Gang, MDiv. Chaplain remains available as needed through the on-call pager: [phone].

## 2020-09-24 LAB — GLUCOSE, CAPILLARY
Glucose-Capillary: 125 mg/dL — ABNORMAL HIGH (ref 70–99)
Glucose-Capillary: 150 mg/dL — ABNORMAL HIGH (ref 70–99)
Glucose-Capillary: 160 mg/dL — ABNORMAL HIGH (ref 70–99)
Glucose-Capillary: 190 mg/dL — ABNORMAL HIGH (ref 70–99)
Glucose-Capillary: 197 mg/dL — ABNORMAL HIGH (ref 70–99)
Glucose-Capillary: 220 mg/dL — ABNORMAL HIGH (ref 70–99)

## 2020-09-24 NOTE — Progress Notes (Signed)
Palliative:  Attempted to speak with wife multiple times today, unable to connect with her. Attempted to call mobile and home phone - mobile voicemail box is full. Left voicemail on home phone. Will ask my colleague to follow up over weekend.  Juel Burrow, DNP, AGNP-C Palliative Medicine Team Team Phone # [phone]  Pager # [phone]  NO CHARGE

## 2020-09-24 NOTE — Progress Notes (Signed)
Occupational Therapy Treatment Patient Details Name: [name] MRN: [id number] DOB: [birth year] Today's Date: 09/24/2020    History of present illness 75 yo male presented 4/20 with memory loss and dysarthria. 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 Evacuation of SDH 4/27. bone flap in place Intubated 4/26-5/2.  PMH CAD depression kidney stones prostate CA HTN Macular degeneration OSA on CPAP, Afib DM2 HOH with hearing aides CABG CAD   OT comments  Pt with good participation, limited at times due to lethargy; demo's improved ability for participation with attempt to verbalize and improved command following of 1 step (but inconsistently) throughout session. initiated washing of face during grooming tasks while seated EOB. Continues to be total +2 for repositioning/bed mobility and total of +1-2 while seated EOB with posterior lean/LOB and no noted righting reactions. severe sublux to L shoulder still present but tolerated WB. Pt tolerated sitting EOB ~9 minutes this date with strong support at trunk for midline alignment and with worsening fatigue pt requiring A for cervical extension and retraction of scapula's. Continues with ?L neglect vs field cut with no scanning to or past midline to L. Pt making slow progress towards goals and anticipate he will continue to require +2 A for further functional transfers training or ADL task. OT will continue to follow acutely.    Follow Up Recommendations  SNF    Equipment Recommendations  Wheelchair (measurements OT);Wheelchair cushion (measurements OT);Hospital bed;Other (comment)    Recommendations for Other Services Speech consult;Other (comment)    Precautions / Restrictions Precautions Precautions: Fall Precaution Comments: hearing aides, top/bottom dentures, cortrak, mitten R hand, L neglect, pushing syndrome Restrictions Weight Bearing Restrictions: No        Mobility Bed Mobility Overal bed mobility: Needs Assistance Bed Mobility: Supine to Sit;Sit to Supine Rolling: +2 for physical assistance;Total assist;+2 for safety/equipment   Supine to sit: Total assist;+2 for physical assistance;+2 for safety/equipment;HOB elevated Sit to supine: +2 for physical assistance;Total assist;+2 for safety/equipment Sit to sidelying: Total assist;+2 for physical assistance      Transfers Overall transfer level: Needs assistance Equipment used: 2 person hand held assist Transfers: Sit to/from Stand Sit to Stand: Total assist;+2 physical assistance;+2 safety/equipment         General transfer comment: attempted STS from EOB with B HHA and use of gait belt, with with resting position of flexion of trunk and cervical requiring heavy cuing and physical assist. with use of belt and pads able to achieve partial transition to standing wtih strong knee block of L LE unable to sustain standing at EOB with total +2 return to sitting.    Balance Overall balance assessment: Needs assistance Sitting-balance support: Feet supported;Single extremity supported Sitting balance-Leahy Scale: Poor Sitting balance - Comments: strong posterior lean without presence of righting throughout session despite extensive trunk support from therapist and visual input form therapist in the front. trialed WB and lean to L shoulder with pillow placement and use of R UE in lap to decreased tendency for pushing, improved with time. Postural control: Posterior lean;Left lateral lean                                 ADL either performed or assessed with clinical judgement   ADL       Grooming: Maximal assistance;Sitting;Cueing for safety;Cueing for sequencing Grooming Details (indicate cue type and reason): oral care  provided dep in supine when returned to bed, pt initatied washing of face this date when therapist provided pt with washcloth but inconsistent and inattention  to L face                             Functional mobility during ADLs: +2 for physical assistance;+2 for safety/equipment;Total assistance General ADL Comments: continues to be primarily total Assist for ADL's and functional transfers at bed level and attempt to transition to standing. see below.     Vision   Additional Comments: limited occular movement during treatment session, able to briefly sustain attention to R fields, and appears to in one instance scan binoccularly to midline but inconsistent and not done again during session   Perception     Praxis      Cognition Arousal/Alertness: Lethargic   Overall Cognitive Status: Impaired/Different from baseline Area of Impairment: Following commands;Attention;Safety/judgement;Awareness                   Current Attention Level: Divided   Following Commands: Follows one step commands inconsistently;Follows one step commands with increased time Safety/Judgement: Decreased awareness of safety;Decreased awareness of deficits Awareness: Intellectual   General Comments: lethergic with observed worsening of fatigue as session progressed        Exercises     Shoulder Instructions       General Comments donned L resting hand splint with pillows to L elbow to address sublux. positioned with pillow and boot for midline alignment    Pertinent Vitals/ Pain       Pain Assessment: Faces Faces Pain Scale: Hurts a little bit Pain Location: during cervical extension and rotation while seated EOB Pain Descriptors / Indicators: Grimacing Pain Intervention(s): Limited activity within patient's tolerance;Repositioned  Home Living                                          Prior Functioning/Environment              Frequency  Min 2X/week        Progress Toward Goals  OT Goals(current goals can now be found in the care plan section)  Progress towards OT goals: Progressing toward  goals  Acute Rehab OT Goals OT Goal Formulation: Patient unable to participate in goal setting  Plan Discharge plan remains appropriate    Co-evaluation                 AM-PAC OT "6 Clicks" Daily Activity     Outcome Measure   Help from another person eating meals?: Total Help from another person taking care of personal grooming?: Total Help from another person toileting, which includes using toliet, bedpan, or urinal?: Total Help from another person bathing (including washing, rinsing, drying)?: Total Help from another person to put on and taking off regular upper body clothing?: Total Help from another person to put on and taking off regular lower body clothing?: Total 6 Click Score: 6    End of Session Equipment Utilized During Treatment: Gait belt  OT Visit Diagnosis: Unsteadiness on feet (R26.81);Muscle weakness (generalized) (M62.81);Hemiplegia and hemiparesis Hemiplegia - Right/Left: Left Hemiplegia - dominant/non-dominant: Dominant Hemiplegia - caused by: Nontraumatic SAH   Activity Tolerance Patient tolerated treatment well;Patient limited by fatigue   Patient Left in bed;with call bell/phone within reach;with bed alarm set;with family/visitor present;with restraints  reapplied   Nurse Communication Mobility status;Precautions     Time: 1610-9604 OT Time Calculation (min): 50 min  Charges: OT General Charges $OT Visit: 1 Visit OT Treatments $Self Care/Home Management : 8-22 mins $Therapeutic Activity: 23-37 mins  Maddelyn Rocca OTR/L acute rehab services Office: [phone]  09/24/2020, 2:51 PM

## 2020-09-24 NOTE — Progress Notes (Signed)
Patient ID: [name], male   DOB: 1945/07/30, 75 y.o.   MRN: [id number] BP 115/83   Pulse 97   Temp 99 F (37.2 C) (Oral)   Resp 19   Ht 6' (1.829 m)   Wt 88.5 kg   SpO2 97%   BMI 26.46 kg/m  Opening eyes, tracking.  Not following commands Lethargic quickly becomes active when spoken to. Exam is unchanged Moving right side purposefully

## 2020-09-25 DIAGNOSIS — J9621 Acute and chronic respiratory failure with hypoxia: Secondary | ICD-10-CM | POA: Diagnosis not present

## 2020-09-25 DIAGNOSIS — Z515 Encounter for palliative care: Secondary | ICD-10-CM | POA: Diagnosis not present

## 2020-09-25 DIAGNOSIS — C712 Malignant neoplasm of temporal lobe: Secondary | ICD-10-CM | POA: Diagnosis not present

## 2020-09-25 LAB — GLUCOSE, CAPILLARY
Glucose-Capillary: 188 mg/dL — ABNORMAL HIGH (ref 70–99)
Glucose-Capillary: 207 mg/dL — ABNORMAL HIGH (ref 70–99)
Glucose-Capillary: 162 mg/dL — ABNORMAL HIGH (ref 70–99)
Glucose-Capillary: 161 mg/dL — ABNORMAL HIGH (ref 70–99)
Glucose-Capillary: 182 mg/dL — ABNORMAL HIGH (ref 70–99)
Glucose-Capillary: 183 mg/dL — ABNORMAL HIGH (ref 70–99)

## 2020-09-25 NOTE — Progress Notes (Signed)
Daily Progress Note   Patient Name: [name]       Date: 09/25/2020 DOB: March 31, 1946  Age: 75 y.o. MRN#: [id number] Attending Physician: Ashok Pall, MD Primary Care Physician: Leanna Battles, MD Admit Date: 09/01/2020  Reason for Consultation/Follow-up: To discuss complex medical decision making related to patient's goals of care  Subjective: Visited patient at bedside.  He is facing away from me but eyes are open.   I ask if he is uncomfortable and he responds "no" in a weak voice.  Otherwise he does not respond to my questions.    I chatted with [name] on the telephone.  She described [name]'s days of waxing and waning.  She has seen slow but steady improvement over the weeks.  She is hopeful for continued improvement and to be able to talk with him.     Pat describes [name] as a very active man who was out chopping wood the day before he came to the hospital.  He has many many friends.  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   I supported [name] in her statements and told her we would help champion [name] as he works hard to regain as much function as possible.  We talked about symptom management.  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  I notice that at home was on gabapentin and vicodin.   Assessment: Patient awake and responds very weakly.   Patient Profile/HPI:  75 y.o.malewith past medical history of three vessel CAD, prostate CA, OSA on CPAP, AF on apixaban, and HTNadmitted on 4/20/2022with altered mental status.MRI 4/1revealedmass lesion in the right superior temporal gyrus with surrounding vasogenic edema and mass effect. The patiet was referred to neurosurgery and ultimately scheduled for surgery.  He was admitted to [facility] 4/20 and taken to the OR under Dr. Christella Noa for craniotomy for tumor resection. The procedure was complicated by intraparenchymal hemorrhage. He continued to have progressive decline of his neurologic status and ultimatly required intubation in the setting of poor airway protection on 4/26. He was extubated 5/2. Mental status continues to wax and wane. Dependent on tube feeds for nourishment. PMT consulted to discuss [name].   Length of Stay: 24  Vital Signs: BP 114/69   Pulse 87   Temp (!) 96.5 F (35.8 C) (Axillary)   Resp 16   Ht 6' (1.829 m)   Wt 87.8 kg   SpO2 96%   BMI 26.25 kg/m  SpO2: SpO2: 96 % O2 Device: O2 Device: Room Air O2 Flow Rate: O2 Flow Rate (L/min): 2 L/min       Palliative Assessment/Data:  20% on tube feeds     Palliative Care Plan    Recommendations/Plan:  PMT will continue to follow with you.  Wife appreciates Doctors input.   Particularly Dr. Renda Rolls input is well received.  Family hopeful for CIR. - Will request for them to evaluate over the coming days / weeks.  Chaplain services requested.  Per wife - patient would love music in the room particularly rock and roll or saxophone music.  Code Status:  Limited code no CPR  Prognosis:  Unable to determine.  He is unfortunately at high risk for acute decline given his fragile state.   Discharge Planning:  To Be Determined  Thank you for allowing the Palliative Medicine Team to assist in the care of this patient.  Total time spent:  35 min.     Greater than 50%  of this time was spent counseling and coordinating care related to the above assessment and plan.  [name], PA-C Palliative Medicine  Please contact Palliative MedicineTeam phone at [phone] for questions and concerns between 7 am - 7 pm.   Please see AMION for individual provider pager numbers.

## 2020-09-25 NOTE — Progress Notes (Signed)
Stable.  Opens eyes in regards me.  No verbalization.  Left hemiplegia.  Purposeful and localizes on the right.  Continue current management

## 2020-09-26 LAB — GLUCOSE, CAPILLARY
Glucose-Capillary: 158 mg/dL — ABNORMAL HIGH (ref 70–99)
Glucose-Capillary: 185 mg/dL — ABNORMAL HIGH (ref 70–99)
Glucose-Capillary: 206 mg/dL — ABNORMAL HIGH (ref 70–99)
Glucose-Capillary: 201 mg/dL — ABNORMAL HIGH (ref 70–99)
Glucose-Capillary: 154 mg/dL — ABNORMAL HIGH (ref 70–99)

## 2020-09-26 NOTE — Progress Notes (Signed)
Chaplain responded to [facility] consult, support for pt.  Wife and pt are spiritual but not connected to organized religion.  Pt could not be roused.  Chaplain visited with wife, [name] and longtime friend, Sam.  Bonney Roussel offered positive, unconditional regard and active listening.  Wife indicates that pt has a wide network of support and visitors.  Wife appeared in relaxed and in good spirits.  She requested a card for our office, so that she can call for support as needed.  Chaplain also advised that any staff member can call our office.   Thank you for this consult.  Please contact as requested or needed for further care.   Luana Shu [phone]    09/26/20 1400  Clinical Encounter Type  Visited With Patient and family together  Visit Type Initial;Follow-up;Psychological support;Spiritual support  Referral From Family  Consult/Referral To Chaplain  Spiritual Encounters  Spiritual Needs Emotional  Stress Factors  Patient Stress Factors Exhausted;Family relationships;Financial concerns;Health changes  Family Stress Factors Family relationships

## 2020-09-26 NOTE — Progress Notes (Signed)
Stable.  Edges eyes and interacts with his wife somewhat.  Spoke with his wife at length at the bedside today.

## 2020-09-27 ENCOUNTER — Inpatient Hospital Stay: Payer: Medicare Other | Attending: Neurosurgery

## 2020-09-27 ENCOUNTER — Telehealth: Payer: Self-pay | Admitting: Radiation Oncology

## 2020-09-27 DIAGNOSIS — Z515 Encounter for palliative care: Secondary | ICD-10-CM | POA: Diagnosis not present

## 2020-09-27 DIAGNOSIS — C712 Malignant neoplasm of temporal lobe: Secondary | ICD-10-CM | POA: Diagnosis not present

## 2020-09-27 DIAGNOSIS — Z7189 Other specified counseling: Secondary | ICD-10-CM | POA: Diagnosis not present

## 2020-09-27 LAB — GLUCOSE, CAPILLARY
Glucose-Capillary: 170 mg/dL — ABNORMAL HIGH (ref 70–99)
Glucose-Capillary: 77 mg/dL (ref 70–99)
Glucose-Capillary: 111 mg/dL — ABNORMAL HIGH (ref 70–99)
Glucose-Capillary: 189 mg/dL — ABNORMAL HIGH (ref 70–99)
Glucose-Capillary: 263 mg/dL — ABNORMAL HIGH (ref 70–99)
Glucose-Capillary: 267 mg/dL — ABNORMAL HIGH (ref 70–99)

## 2020-09-27 NOTE — Progress Notes (Signed)
Physical Therapy Treatment Patient Details Name: [name] MRN: [id number] DOB: 11/01/1945 Today's Date: 09/27/2020    History of Present Illness 75 yo male presented 4/20 with memory loss and dysarthria. 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 Evacuation of SDH 4/27. bone flap in place Intubated 4/26-5/2.  PMH CAD depression kidney stones prostate CA HTN Macular degeneration OSA on CPAP, Afib DM2 HOH with hearing aides CABG CAD    PT Comments    The pt presents with continued lethargy at this time, but was able to follow x2 commands for RUE regarding grasping and letting go of an object. The pt continues to require totalA of 2 to safely complete bed mobility and transfers to sitting EOB due to inattention to and weakness of L extremities. The pt required totalA to support his trunk in sitting and placement of supports under LUE to maintain safe shoulder positioning. However, the pt was noted to have decreased eye opening and alertness in sitting, BP revealed drop to 86/50, so the pt was returned to sideling position for remainder of session. Pt with good tolerance of PROM exercises for increased L cervical rotation and lateral flexion as well as improved L scapular mobility. Continue to recommend SNF level rehab following d/c to maximize safety with mobility and to decreased caregiver burden.    Follow Up Recommendations  SNF;Supervision/Assistance - 24 hour     Equipment Recommendations  Wheelchair (measurements PT);Wheelchair cushion (measurements PT);Hospital bed;Other (comment)    Recommendations for Other Services       Precautions / Restrictions Precautions Precautions: Fall Precaution Comments: hearing aides, top/bottom dentures, cortrak, mitten R hand, L neglect, pushing syndrome, L shoulder subluxation Restrictions Weight Bearing Restrictions: No    Mobility  Bed Mobility Overal bed  mobility: Needs Assistance Bed Mobility: Rolling;Sidelying to Sit;Sit to Sidelying Rolling: Total assist;+2 for physical assistance Sidelying to sit: Total assist;+2 for physical assistance     Sit to sidelying: Total assist;+2 for physical assistance General bed mobility comments: pt with some movement of RUE, but no movement to assist with bed mobility or rolling despite cues. totalA for all aspects and maintaining safe position of L extremities due to inattention and weakness    Transfers                 General transfer comment: deferred due to drop in BP with transition to sitting EOB (86/50)     Modified Rankin (Stroke Patients Only) Modified Rankin (Stroke Patients Only) Pre-Morbid Rankin Score: No symptoms Modified Rankin: Severe disability     Balance Overall balance assessment: Needs assistance Sitting-balance support: Feet supported;Single extremity supported Sitting balance-Leahy Scale: Poor Sitting balance - Comments: strong posterior lean without presence of righting throughout session despite extensive trunk support from therapist and support under LUE for shoulder position. Pt with increased lethargy in sitting, BP showed significant drop to 86/50 and pt then returned to sidelying. Postural control: Posterior lean;Left lateral lean                                  Cognition Arousal/Alertness: Lethargic Behavior During Therapy: Flat affect Overall Cognitive Status: Impaired/Different from baseline Area of Impairment: Following commands;Attention;Safety/judgement;Awareness                   Current Attention Level:  (pt did not maintain attention to task, no visual tracking noted)  Following Commands: Follows one step commands inconsistently;Follows one step commands with increased time Safety/Judgement: Decreased awareness of safety;Decreased awareness of deficits     General Comments: lethergic with observed worsening of fatigue  as session progressed, especially with transition to sitting EOB      Exercises Other Exercises Other Exercises: PROM cervical rotation and lateral sidebending to L. x5 Other Exercises: PROM scapular protraction and retraction with hold for stretch in protracted position    General Comments General comments (skin integrity, edema, etc.): SpO2 stable, no evidence of shortness of breath. Pt with drop in BP in sitting EOB, returned to sidelying where SBP stabilized from 80 to 117      Pertinent Vitals/Pain Pain Assessment: Faces Faces Pain Scale: Hurts a little bit Pain Location: grimacing with cervical extension and rotation Pain Descriptors / Indicators: Grimacing Pain Intervention(s): Limited activity within patient's tolerance;Monitored during session;Repositioned           PT Goals (current goals can now be found in the care plan section) Acute Rehab PT Goals Patient Stated Goal: max therapies (per wife) PT Goal Formulation: With patient/family Time For Goal Achievement: 10/01/2020 Potential to Achieve Goals: Fair Progress towards PT goals: Progressing toward goals    Frequency    Min 3X/week      PT Plan Current plan remains appropriate    Co-evaluation PT/OT/SLP Co-Evaluation/Treatment: Yes Reason for Co-Treatment: Necessary to address cognition/behavior during functional activity;For patient/therapist safety;To address functional/ADL transfers PT goals addressed during session: Mobility/safety with mobility;Balance;Strengthening/ROM        AM-PAC PT "6 Clicks" Mobility   Outcome Measure  Help needed turning from your back to your side while in a flat bed without using bedrails?: Total Help needed moving from lying on your back to sitting on the side of a flat bed without using bedrails?: Total Help needed moving to and from a bed to a chair (including a wheelchair)?: Total Help needed standing up from a chair using your arms (e.g., wheelchair or bedside  chair)?: Total Help needed to walk in hospital room?: Total Help needed climbing 3-5 steps with a railing? : Total 6 Click Score: 6    End of Session   Activity Tolerance: Patient limited by lethargy (drop in BP in sitting) Patient left: in bed (pt being transferred to new unit) Nurse Communication: Mobility status PT Visit Diagnosis: Other abnormalities of gait and mobility (R26.89);Hemiplegia and hemiparesis;Muscle weakness (generalized) (M62.81);Difficulty in walking, not elsewhere classified (R26.2);Other symptoms and signs involving the nervous system (R29.898) Hemiplegia - Right/Left: Left Hemiplegia - dominant/non-dominant: Dominant Hemiplegia - caused by: Nontraumatic intracerebral hemorrhage     Time: 1540-0867 PT Time Calculation (min) (ACUTE ONLY): 25 min  Charges:  $Therapeutic Activity: 8-22 mins                     Karma Ganja, PT, DPT   Acute Rehabilitation Department Pager #: [phone]   Otho Bellows 09/27/2020, 2:45 PM

## 2020-09-27 NOTE — Progress Notes (Signed)
Occupational Therapy Treatment Patient Details Name: [name] MRN: [id number] DOB: 1945/09/22 Today's Date: 09/27/2020    History of present illness 75 yo male presented 4/20 with memory loss and dysarthria. 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 Evacuation of SDH 4/27. bone flap in place Intubated 4/26-5/2.  PMH CAD depression kidney stones prostate CA HTN Macular degeneration OSA on CPAP, Afib DM2 HOH with hearing aides CABG CAD   OT comments  Pt with restless movement of R side of body on arrival supine. Pt progressed to EOB with decreased SBP and returned to supine with rebound. Pt with passive stretch of L UE scapula with careful positioning of L UE. Pt followed simple command to hold OT hand/ squeeze and release with long more than 15 second pauses between commands. Recommendation SNf at this time pending progress.    Follow Up Recommendations  SNF    Equipment Recommendations  Wheelchair (measurements OT);Wheelchair cushion (measurements OT);Hospital bed;Other (comment)    Recommendations for Other Services Speech consult;Other (comment)    Precautions / Restrictions Precautions Precautions: Fall Precaution Comments: hearing aides, top/bottom dentures, cortrak, mitten R hand, L neglect, pushing syndrome, L shoulder subluxation Restrictions Weight Bearing Restrictions: No       Mobility Bed Mobility Overal bed mobility: Needs Assistance Bed Mobility: Rolling;Sidelying to Sit;Sit to Sidelying Rolling: Total assist;+2 for physical assistance Sidelying to sit: Total assist;+2 for physical assistance     Sit to sidelying: Total assist;+2 for physical assistance General bed mobility comments: pt with some movement of RUE, but no movement to assist with bed mobility or rolling despite cues. totalA for all aspects and maintaining safe position of L extremities due to inattention and weakness     Transfers                 General transfer comment: deferred due to drop in BP with transition to sitting EOB (86/50)    Balance Overall balance assessment: Needs assistance Sitting-balance support: Feet supported;Single extremity supported Sitting balance-Leahy Scale: Poor Sitting balance - Comments: strong posterior lean without presence of righting throughout session despite extensive trunk support from therapist and support under LUE for shoulder position. Pt with increased lethargy in sitting, BP showed significant drop to 86/50 and pt then returned to sidelying. Postural control: Posterior lean;Left lateral lean                                 ADL either performed or assessed with clinical judgement   ADL Overall ADL's : Needs assistance/impaired                                       General ADL Comments: pt with decreased BP at this time so ADL task not assessed     Vision       Perception     Praxis      Cognition Arousal/Alertness: Lethargic Behavior During Therapy: Flat affect Overall Cognitive Status: Impaired/Different from baseline Area of Impairment: Following commands;Attention;Safety/judgement;Awareness                   Current Attention Level:  (pt did not maintain attention to task, no visual tracking noted)   Following Commands: Follows one step commands inconsistently;Follows one step commands with increased time Safety/Judgement: Decreased awareness  of safety;Decreased awareness of deficits     General Comments: lethergic with observed worsening of fatigue as session progressed, especially with transition to sitting EOB followed simple command to squeeze hand and release on command        Exercises Exercises: Other exercises Other Exercises Other Exercises: PROM cervical rotation and lateral sidebending to L. x5 Other Exercises: PROM scapular protraction and retraction with hold for stretch in  protracted position   Shoulder Instructions       General Comments SpO2 stable, no evidence of shortness of breath. Pt with drop in BP in sitting EOB, returned to sidelying where SBP stabilized from 80 to 117    Pertinent Vitals/ Pain       Pain Assessment: Faces Faces Pain Scale: Hurts a little bit Pain Location: grimacing with cervical extension and rotation Pain Descriptors / Indicators: Grimacing Pain Intervention(s): Limited activity within patient's tolerance;Monitored during session;Repositioned  Home Living                                          Prior Functioning/Environment              Frequency  Min 2X/week        Progress Toward Goals  OT Goals(current goals can now be found in the care plan section)  Progress towards OT goals: Progressing toward goals  Acute Rehab OT Goals Patient Stated Goal: max therapies (per wife) OT Goal Formulation: Patient unable to participate in goal setting Time For Goal Achievement: 09/23/2020 Potential to Achieve Goals: Good ADL Goals Additional ADL Goal #1: pt will initiate bed mobility with R UE 50% of session Additional ADL Goal #2: Pt will complete bed mobilty total +2 mod (A) as precursor to adls Additional ADL Goal #3: pt will follow 1 step command 50% of session  Plan Discharge plan remains appropriate    Co-evaluation    PT/OT/SLP Co-Evaluation/Treatment: Yes Reason for Co-Treatment: Necessary to address cognition/behavior during functional activity;For patient/therapist safety;To address functional/ADL transfers PT goals addressed during session: Mobility/safety with mobility;Balance;Strengthening/ROM OT goals addressed during session: ADL's and self-care;Proper use of Adaptive equipment and DME;Strengthening/ROM      AM-PAC OT "6 Clicks" Daily Activity     Outcome Measure   Help from another person eating meals?: Total Help from another person taking care of personal grooming?: A  Lot Help from another person toileting, which includes using toliet, bedpan, or urinal?: Total Help from another person bathing (including washing, rinsing, drying)?: Total Help from another person to put on and taking off regular upper body clothing?: Total Help from another person to put on and taking off regular lower body clothing?: Total 6 Click Score: 7    End of Session    OT Visit Diagnosis: Unsteadiness on feet (R26.81);Muscle weakness (generalized) (M62.81);Hemiplegia and hemiparesis Hemiplegia - Right/Left: Left Hemiplegia - dominant/non-dominant: Dominant Hemiplegia - caused by: Nontraumatic SAH   Activity Tolerance Patient tolerated treatment well;Patient limited by fatigue   Patient Left in bed;with call bell/phone within reach;with bed alarm set;with family/visitor present;with restraints reapplied   Nurse Communication Mobility status;Precautions        Time: 5102-5852 OT Time Calculation (min): 25 min  Charges: OT General Charges $OT Visit: 1 Visit   Fleeta Emmer, OTR/L  Acute Rehabilitation Services Pager: [phone] Office: [phone] .    Jeri Modena 09/27/2020, 3:43 PM

## 2020-09-27 NOTE — Telephone Encounter (Signed)
I called the patient's spouse and let her know that his case was discussed in brain conference and given the level of neurologic awareness we will move his appt out another two weeks. His wife is in agreement.

## 2020-09-27 NOTE — Progress Notes (Signed)
Palliative:  HPI: 75 y.o.malewith past medical history of three vessel CAD, prostate CA, OSA on CPAP, AF on apixaban, and HTNadmitted on 4/20/2022with altered mental status.MRI 4/1revealedmass lesion in the right superior temporal gyrus with surrounding vasogenic edema and mass effect. The patiet was referred to neurosurgery and ultimately scheduled for surgery. He was admitted to [facility] 4/20 and taken to the OR under Dr. Christella Noa for craniotomy for tumor resection. The procedure was complicated by intraparenchymal hemorrhage. He continued to have progressive decline of his neurologic status and ultimatly required intubation in the setting of poor airway protection on 4/26. He was extubated 5/2. Mental status continues to wax and wane. Dependent on tube feeds for nourishment. PMT consulted to discuss [place].  I met today again with [name] and we spoke privately while [name] rested. [name] shares concerns that [name] is still in ICU and worried about financial repercussions if ICU is no longer needed. We talk more about the path moving forward. [name] inquires about rehab options including CIR. I explained to [name] that I do not feel that [name] would be able to tolerate the intensive therapy in CIR. We discussed other rehab and the fact that he continues to have Cortrak and in order to leave the hospital he would need long term feeding tube if this was desired. [name] is not keen on long term feeding tube and notes that she had a friend who had a feeding tube and knows there is a lot that comes with this intervention. [name] expresses desire for [name] to have more movement and therapy. We discussed PROM from nurses and even getting him out of bed to chair with lift.   I discussed with [name] that there will be more difficult decisions to make. [name] shares that she just wanted [name] to be comfortable and not suffer for however long he has and she knows his prognosis is poor. She desperately wants to see if he can improve enough to  talk to her. She struggles with these decisions because she still sees [name]'s personality coming through. She shares that he hates when she wears her hair up and he recently took the clip out of her hair and played with her hair as he would have done at home. Moments like this are what makes this so very difficult for Pat. I did take this opportunity to readdress code status with [name] and given her statements today I recommended that we do not place him back on ventilator support as this would likely cause him more suffering at the end of his life. [name] understands and wants 24 hours to consider this further.   All questions/concerns addressed. Emotional support provided.   Exam: Minimally responsive. No distress. Cortrak in place. Abd soft. Purposeful movement of RUE.   Plan: - Move out of ICU.  - Increase activity and even PROM for [name] comfort.  - Wife reconsidering code status wishes.   [place], NP Palliative Medicine Team Pager [phone] (Please see [email or web address] for schedule) Team Phone [phone]    Greater than 50%  of this time was spent counseling and coordinating care related to the above assessment and plan

## 2020-09-27 NOTE — TOC Progression Note (Signed)
Transition of Care [facility]) - Progression Note    Patient Details  Name: [name] MRN: [id number] Date of Birth: [birth year]  Transition of Care [facility]) CM/SW Contact  Oren Section Cleta Alberts, RN Phone Number: 09/27/2020, 5:26 PM  Clinical Narrative:  75 yo male presented 4/20 with memory loss and dysarthria. 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 Evacuation of SDH 4/27. bone flap in place Intubated 4/26-5/2.  Palliative Medicine Team continues to follow up with wife for goals of care.  Received order to have CIR assess, however SNF is recommended, and pt currently unable to tolerate activity requirements for rehab.  Should patient be dc to SNF, he will need permanent means of feeding, ie. PEG tube.   Will follow as PMT continues to work with wife; hopeful for further progression with activity.    Expected Discharge Plan: [place] Barriers to Discharge: Continued Medical Work up  Expected Discharge Plan and Services Expected Discharge Plan: [place]   Discharge Planning Services: CM Consult   Living arrangements for the past 2 months: Single Family Home                                       Social Determinants of Health (SDOH) Interventions    Readmission Risk Interventions No flowsheet data found.  Sean Raddle, RN, BSN  Trauma/Neuro ICU Case Manager [phone]

## 2020-09-27 NOTE — Progress Notes (Signed)
Patient ID: [name], male   DOB: 1946-05-08, 75 y.o.   MRN: [id number] BP 120/68 (BP Location: Left Arm)   Pulse 89   Temp 97.8 F (36.6 C) (Axillary)   Resp 18   Ht 6' (1.829 m)   Wt 89.9 kg   SpO2 96%   BMI 26.88 kg/m  Eyes open, moving right side purposefully Not following commands No neurological change Has been discussed at tumor board. Everyone aware of his neurological status. Waiting for improvement

## 2020-09-28 ENCOUNTER — Encounter (HOSPITAL_COMMUNITY): Payer: Self-pay | Admitting: Neurosurgery

## 2020-09-28 DIAGNOSIS — Z7189 Other specified counseling: Secondary | ICD-10-CM | POA: Diagnosis not present

## 2020-09-28 DIAGNOSIS — C712 Malignant neoplasm of temporal lobe: Secondary | ICD-10-CM | POA: Diagnosis not present

## 2020-09-28 DIAGNOSIS — E43 Unspecified severe protein-calorie malnutrition: Secondary | ICD-10-CM | POA: Insufficient documentation

## 2020-09-28 DIAGNOSIS — Z515 Encounter for palliative care: Secondary | ICD-10-CM | POA: Diagnosis not present

## 2020-09-28 LAB — GLUCOSE, CAPILLARY
Glucose-Capillary: 115 mg/dL — ABNORMAL HIGH (ref 70–99)
Glucose-Capillary: 157 mg/dL — ABNORMAL HIGH (ref 70–99)
Glucose-Capillary: 189 mg/dL — ABNORMAL HIGH (ref 70–99)
Glucose-Capillary: 211 mg/dL — ABNORMAL HIGH (ref 70–99)
Glucose-Capillary: 203 mg/dL — ABNORMAL HIGH (ref 70–99)
Glucose-Capillary: 230 mg/dL — ABNORMAL HIGH (ref 70–99)
Glucose-Capillary: 198 mg/dL — ABNORMAL HIGH (ref 70–99)

## 2020-09-28 MED ORDER — PIVOT 1.5 CAL PO LIQD
1000.0000 mL | ORAL | Status: DC
  Administered 2020-09-28 – 2020-10-02 (×7): 1000 mL
  Filled 2020-09-28 (×10): qty 1000

## 2020-09-28 NOTE — Progress Notes (Signed)
Palliative:  HPI: 75 y.o.malewith past medical history of three vessel CAD, prostate CA, OSA on CPAP, AF on apixaban, and HTNadmitted on 4/20/2022with altered mental status.MRI 4/1revealedmass lesion in the right superior temporal gyrus with surrounding vasogenic edema and mass effect. The patiet was referred to neurosurgery and ultimately scheduled for surgery. He was admitted to [facility] 4/20 and taken to the OR under Dr. Christella Noa for craniotomy for tumor resection. The procedure was complicated by intraparenchymal hemorrhage. He continued to have progressive decline of his neurologic status and ultimatly required intubation in the setting of poor airway protection on 4/26. He was extubated 5/2. Mental status continues to wax and wane. Dependent on tube feeds for nourishment. PMT consulted to discuss [place].  I met today at [facility] bedside with [facility]. [name] shares that she is having some anxiety about certain family that plan to visit but then they cancelled their visit. [name] shares that she needs more time to reconsider intubation wishes if needed and to look through [facility]. She continues to be hopeful that she can have good moments with [name] although she knows his prognosis is poor - this makes the good moments even better to her. I spent time with [name] allowing her to voice her stressors and concerns.   All questions/concerns addressed. Therapeutic listening provided. Emotional support provided.   Exam: Minimally responsive. No distress. Cortrak in place. Abd soft. Purposeful movement of RUE.   Plan: - Wife still reconsidering code status wishes.  - Recommend increased PROM from nursing and recommend lift to chair daily. - Continue current therapies.   25 min   Vinie Sill, NP Palliative Medicine Team Pager [phone] (Please see [email or web address] for schedule) Team Phone [phone]    Greater than 50%  of this time was spent counseling and coordinating care related to the above  assessment and plan

## 2020-09-28 NOTE — Progress Notes (Signed)
Patient ID: EBB CARELOCK, male   DOB: [birth year], 75 y.o.   MRN: [id number] BP (!) 138/97 (BP Location: Left Arm)   Pulse 89   Temp 97.7 F (36.5 C) (Axillary)   Resp 18   Ht 6' (1.829 m)   Wt 86.8 kg   SpO2 97%   BMI 25.95 kg/m  Purposeful on right.  No neurological change Eyes open Breathing comfortably Continue supportive treatment

## 2020-09-28 NOTE — Care Management Important Message (Signed)
Important Message  Patient Details  Name: [name] MRN: [id number] Date of Birth: 1945-08-21   Medicare Important Message Given:  Yes     Orval Dortch Montine Circle 09/28/2020, 4:38 PM

## 2020-09-28 NOTE — Progress Notes (Signed)
Nutrition Follow-up  DOCUMENTATION CODES:   Severe malnutrition in context of acute illness/injury  INTERVENTION:   - Recommend considering more permanent enteral access (i.e. PEG)  Continue tube feeding via Cortrak: - Increase Pivot 1.5 to 70 ml/hr (1680 ml/day) - Continue free water flushes of 200 ml q 8 hours  Tube feeding regimen provides 2520 kcal, 157 grams of protein, and 1260 ml of H2O.  Total free water with flushes: 1860 ml  - Continue MVI with minerals daily per tube  NUTRITION DIAGNOSIS:   Severe Malnutrition related to acute illness (R temporal lobe lesion s/p craniotomy) as evidenced by moderate fat depletion,moderate muscle depletion,percent weight loss (9.1% weight loss in less than 1 month).  New diagnosis after completion of NFPE  GOAL:   Patient will meet greater than or equal to 90% of their needs  Met via TF  MONITOR:   Diet advancement,Weight trends,TF tolerance  REASON FOR ASSESSMENT:   Consult Enteral/tube feeding initiation and management  ASSESSMENT:   Pt with PMH of HTN, depression, OA, OSA on CPAP, DM, CAD s/p CABG x 3 05/2019, incisional hernia admitted for R temporal lobe lesion plan for resection of mass.  4/20 - s/p R craniotomy for tumor  4/21 - pt with new L sided weakness and lethargy and R gaze preference, stat CT shows new large hemorrhage deep in resection cavity with midline shift 4/22 - Cortrak placed (tip gastric) 4/26 - s/p craniotomy for hematoma evacuation, returned intubated 5/02 - extubated  Spoke with pt's wife at bedside. Pt sleeping soundly at time of RD visit. Pt's wife with no questions at this time but does note that pt has lost a lot of muscle mass. Last NFPE completed on 09/03/20. RD completed a follow-up NFPE.  Admit weight: 95.5 kg Current weight: 86.8 kg  Pt with a total weight loss of 8.7 kg since 09/01/20. This is a 9.1% weight loss in less than 1 month which is severe and significant for timeframe. Based  on repeat NFPE and weight loss, pt meets criteria for severe acute malnutrition. RD to increase tube feeding rate to prevent additional weight loss.  Medications reviewed and include: IV decadron, SSI q 4 hours, lantus 5 units daily, MVI with minerals, protonix, senna  Labs reviewed. CBG's: 115-267 x 24 hours  UOP: 1450 ml x 24 hours  NUTRITION - FOCUSED PHYSICAL EXAM:  Flowsheet Row Most Recent Value  Orbital Region Mild depletion  Upper Arm Region Moderate depletion  Thoracic and Lumbar Region Moderate depletion  Buccal Region Mild depletion  Temple Region Mild depletion  Clavicle Bone Region Moderate depletion  Clavicle and Acromion Bone Region Moderate depletion  Scapular Bone Region Unable to assess  Dorsal Hand Mild depletion  Patellar Region Moderate depletion  Anterior Thigh Region Moderate depletion  Posterior Calf Region Moderate depletion  Edema (RD Assessment) None  Hair Reviewed  Eyes Unable to assess  Mouth Unable to assess  Skin Reviewed  Nails Reviewed       Diet Order:   Diet Order            Diet NPO time specified  Diet effective now                 EDUCATION NEEDS:   No education needs have been identified at this time  Skin:  Skin Assessment: Skin Integrity Issues: Incisions: head Other: skin tear to coccyx  Last BM:  09/25/20  Height:   Ht Readings from Last 1 Encounters:  09/07/20  6' (1.829 m)    Weight:   Wt Readings from Last 1 Encounters:  09/28/20 86.8 kg    Ideal Body Weight:  80.9 kg  BMI:  Body mass index is 25.95 kg/m.  Estimated Nutritional Needs:   Kcal:  3552-1747  Protein:  130-150 grams  Fluid:  >2 L/day    Gustavus Bryant, MS, RD, LDN Inpatient Clinical Dietitian Please see AMiON for contact information.

## 2020-09-29 ENCOUNTER — Encounter (HOSPITAL_COMMUNITY): Payer: Self-pay | Admitting: Neurosurgery

## 2020-09-29 DIAGNOSIS — C712 Malignant neoplasm of temporal lobe: Secondary | ICD-10-CM | POA: Diagnosis not present

## 2020-09-29 DIAGNOSIS — Z515 Encounter for palliative care: Secondary | ICD-10-CM | POA: Diagnosis not present

## 2020-09-29 DIAGNOSIS — Z7189 Other specified counseling: Secondary | ICD-10-CM | POA: Diagnosis not present

## 2020-09-29 LAB — GLUCOSE, CAPILLARY
Glucose-Capillary: 169 mg/dL — ABNORMAL HIGH (ref 70–99)
Glucose-Capillary: 159 mg/dL — ABNORMAL HIGH (ref 70–99)
Glucose-Capillary: 167 mg/dL — ABNORMAL HIGH (ref 70–99)
Glucose-Capillary: 215 mg/dL — ABNORMAL HIGH (ref 70–99)
Glucose-Capillary: 225 mg/dL — ABNORMAL HIGH (ref 70–99)

## 2020-09-29 LAB — SURGICAL PATHOLOGY

## 2020-09-29 MED ORDER — CHLORHEXIDINE GLUCONATE 0.12 % MT SOLN: 15.0000 mL | Freq: Two times a day (BID) | OROMUCOSAL | Status: DC

## 2020-09-29 MED ORDER — ACETAMINOPHEN 160 MG/5ML PO SOLN
650.0000 mg | Freq: Four times a day (QID) | ORAL | Status: DC | PRN
  Administered 2020-09-29 – 2020-10-02 (×6): 650 mg
  Filled 2020-09-29 (×6): qty 20.3

## 2020-09-29 MED ORDER — ACETAMINOPHEN 650 MG RE SUPP: 650.0000 mg | Freq: Four times a day (QID) | RECTAL | Status: DC | PRN

## 2020-09-29 MED ORDER — ORAL CARE MOUTH RINSE
15.0000 mL | Freq: Two times a day (BID) | OROMUCOSAL | Status: DC
  Administered 2020-09-29 (×2): 15 mL via OROMUCOSAL

## 2020-09-29 NOTE — Progress Notes (Signed)
Patient ID: [name], male   DOB: 1945/08/19, 75 y.o.   MRN: [id number] BP 124/81 (BP Location: Right Arm)   Pulse 79   Temp 98.2 F (36.8 C) (Oral)   Resp 17   Ht 6' (1.829 m)   Wt 87.4 kg   SpO2 96%   BMI 26.13 kg/m  Eyes open, not following commands Moving right side well Plegic on left, left facial droop Family still contending with decision.

## 2020-09-29 NOTE — Progress Notes (Signed)
Palliative:  HPI:75 y.o.malewith past medical history of three vessel CAD, prostate CA, OSA on CPAP, AF on apixaban, and HTNadmitted on 4/20/2022with altered mental status.MRI 4/1revealedmass lesion in the right superior temporal gyrus with surrounding vasogenic edema and mass effect. The patiet was referred to neurosurgery and ultimately scheduled for surgery. He was admitted to [facility] 4/20 and taken to the OR under Dr. Christella Noa for craniotomy for tumor resection. The procedure was complicated by intraparenchymal hemorrhage. He continued to have progressive decline of his neurologic status and ultimatly required intubation in the setting of poor airway protection on 4/26. He was extubated 5/2. Mental status continues to wax and wane. Dependent on tube feeds for nourishment. PMT consulted to discuss [place].  I met today at [facility] bedside. [name] continues to ask about next steps. We stepped out to speak privately. I discussed with [name] that next steps really depend on her wishes and goals for Antwane since he cannot tell us. We discussed all options from the hospital including SNF with PEG, hospice facility with comfort care (no artificial feeding), home with home health, home with hospice and continued or not continued artificial feeding. I have previously, although not commonly, sent someone home with Cortrak and hospice although this is not the typical path. We could ask hospice if this would be an option. [name] does not really want PEG placed but also not wanting to stop Cortrak feeding. She wants to grasp on to each of the good moments that she can get with [name]. I did explain to [facility] that I do believe that this is probably as good as Jagar is going to get. That he will continue to have good moments where he can interact and she can see his personality and this may be more prominent on good days. I also think this will continue to be fluctuating and days he is less responsive and mainly sleeps. We did  discuss that even with continued artificial feeding his body will deteriorate in his current condition and will lead to skin breakdown or infections (pneumonia, UTI, etc). [name] seems most inclined towards hospice at home especially if she can continue Cortrak. I did explain that this would also mean that when he does decline hospice would focus on keeping him comfortable. Pat understands. [name] also shares that they have discussed previously their shared desire to die in their home. [name] will continue to process and consider options and plans to speak with friends to help her decide the best pathway.   All questions/concerns addressed. Emotional support provided.   Exam: Minimally responsive. No distress. Cortrak in place. Abd soft. Purposeful movement of RUE.   Plan: - [name] is really considering her wishes on how to move forward and goals of care for [facility].  - I have requested my colleague, Darol Destine, to follow up and continue to discuss with [name].   Cooper, NP Palliative Medicine Team Pager [phone] (Please see [email or web address] for schedule) Team Phone [phone]    Greater than 50%  of this time was spent counseling and coordinating care related to the above assessment and plan

## 2020-09-29 NOTE — Plan of Care (Signed)
  Problem: Education: Goal: Knowledge of General Education information will improve Description Including pain rating scale, medication(s)/side effects and non-pharmacologic comfort measures Outcome: Progressing   Problem: Health Behavior/Discharge Planning: Goal: Ability to manage health-related needs will improve Outcome: Progressing   

## 2020-09-29 NOTE — Progress Notes (Addendum)
  Speech Language Pathology Treatment: Dysphagia  Patient Details Name: [name] MRN: [id number] DOB: 12-27-1945 Today's Date: 09/29/2020 Time: 0912-0932 SLP Time Calculation (min) (ACUTE ONLY): 20 min  Assessment / Plan / Recommendation Clinical Impression  Unfortunately pt is not able to actively participate in therapy and has been declining each session. His gaze is to the right and downward focusing attention for 1 second when name called and in line of vision. Therapist removed a 7-8 inch long and wide layer of mucous from hard palate and faucial arches, tongue. Utilized ice, toothette sponges and tweezers to debride. Delayed swallow x 1, no vocalizations or attempts to communicate. Did not follow commands. Spoke with RN, sign placed above bed to remind staff of diligent oral care given NPO status and mouth breather and always observe hard palate and posterior tongue. Therapy is no longer considered skilled at this time and ST will sign off. Please reconsult if needed.  ADDENDUM: 1353  PT/OT report pt with increased interaction during their session. Following some commands, said "yeah" when asked if he wanted to get up to chair." SLP reinstated orders and will continue to treat in tandem with PT/OT which is needed for alertness and response.     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 Christella Noa.  He had post operative intraparenchymal hemorrhage and returned to OR 4/26 for craniotomy and evacuation of SDH. Intubated 4/26-5/2.      SLP Plan  Discharge SLP treatment due to (comment)       Recommendations  Diet recommendations: NPO Medication Administration: Via alternative means                Oral Care Recommendations: Oral care QID Follow up Recommendations: 24 hour supervision/assistance;Skilled Nursing facility SLP Visit Diagnosis:  Dysphagia, unspecified (R13.10) Plan: Discharge SLP treatment due to (comment)                  Functional Limitations: Swallowing    [name] 09/29/2020, 9:53 AM  [name] Caroli.Ed Risk analyst [phone] Office [phone]

## 2020-09-29 NOTE — Progress Notes (Signed)
Occupational Therapy Treatment Patient Details Name: [name] MRN: [id number] DOB: 1946/02/21 Today's Date: 09/29/2020    History of present illness 75 yo male presented 4/20 with memory loss and dysarthria. 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 Evacuation of SDH 4/27. bone flap in place Intubated 4/26-5/2.  PMH CAD depression kidney stones prostate CA HTN Macular degeneration OSA on CPAP, Afib DM2 HOH with hearing aides CABG CAD   OT comments  Pt verbalized this session with  "yes" and a head nod appropriately. Pt demonstrates simple command following. Pt does not facilitation muscle activation at this time with tactile cues for head alignment. Requesting a soft collar to help with alignment to progress the patient. Pt could benefit from positioning toward L during the day to help with passive stretch of cervical against gravity to the L. Recommendation at this time remains SNF   Follow Up Recommendations  SNF    Equipment Recommendations  Wheelchair (measurements OT);Wheelchair cushion (measurements OT);Hospital bed;Other (comment)    Recommendations for Other Services Speech consult    Precautions / Restrictions Precautions Precautions: Fall Precaution Comments: hearing aides, top/bottom dentures, cortrak, mitten R hand, L neglect, R pushing demonstrated, L shoulder subluxation Required Braces or Orthoses: Other Brace Other Brace: resting hand splint       Mobility Bed Mobility Overal bed mobility: Needs Assistance Bed Mobility: Rolling;Supine to Sit Rolling: Max assist;+2 for physical assistance Sidelying to sit: Max assist;+2 for physical assistance       General bed mobility comments: pt reaching and turning head toward R to demonstrate initiation of bed mobility. pt requires (A) to elevate trunk. pt release grab bar on commad. pt static sitting mod to max (A) pt needs cues and hand  over hand to place R ue to prevent pushing. pt developing a strong pushing response to upright posture    Transfers Overall transfer level: Needs assistance Equipment used: 2 person hand held assist Transfers: Sit to/from Stand Sit to Stand: +2 physical assistance;Total assist         General transfer comment: pt with pad used at hips to help anterior shift pt into standing. pt requries R UE removed from stedy to prevent pushing backward. pt static sitting in stedy . pt sit<>Stand this session x4. BP monitored and stable    Balance Overall balance assessment: Needs assistance Sitting-balance support: Single extremity supported;Feet supported Sitting balance-Leahy Scale: Poor                                     ADL either performed or assessed with clinical judgement   ADL Overall ADL's : Needs assistance/impaired                                       General ADL Comments: total (A) for all adls at this time. worked on neck extension, scapula retraction, static sitting and weight bearing in stedy this session     Vision       Perception     Praxis      Cognition Arousal/Alertness: Awake/alert Behavior During Therapy: Flat affect Overall Cognitive Status: Impaired/Different from baseline Area of Impairment: Following commands;Attention;Safety/judgement;Awareness                   Current Attention  Level: Selective   Following Commands: Follows one step commands inconsistently;Follows one step commands with increased time Safety/Judgement: Decreased awareness of safety;Decreased awareness of deficits Awareness: Intellectual   General Comments: pt verbalized Yes with a head nod in response to "do you want to sit up" pt very clear and verbalized. Pt following simple commands on R side        Exercises     Shoulder Instructions       General Comments noted to have a suture in R skull and RN shown the location during session.  pt with strong R neck rotation and requesting a soft collar for alignment during sessions only to help with progression    Pertinent Vitals/ Pain       Pain Assessment: No/denies pain  Home Living                                          Prior Functioning/Environment              Frequency  Min 2X/week        Progress Toward Goals  OT Goals(current goals can now be found in the care plan section)  Progress towards OT goals: Progressing toward goals  Acute Rehab OT Goals Patient Stated Goal: states "yes " for oob today OT Goal Formulation: Patient unable to participate in goal setting Time For Goal Achievement: 10-20-20 Potential to Achieve Goals: Good ADL Goals Additional ADL Goal #1: pt will initiate bed mobility with R UE 50% of session Additional ADL Goal #2: Pt will complete bed mobilty total +2 mod (A) as precursor to adls Additional ADL Goal #3: pt will follow 1 step command 50% of session  Plan Discharge plan remains appropriate    Co-evaluation    PT/OT/SLP Co-Evaluation/Treatment: Yes Reason for Co-Treatment: Complexity of the patient's impairments (multi-system involvement);For patient/therapist safety;Necessary to address cognition/behavior during functional activity;To address functional/ADL transfers   OT goals addressed during session: ADL's and self-care;Proper use of Adaptive equipment and DME;Strengthening/ROM      AM-PAC OT "6 Clicks" Daily Activity     Outcome Measure   Help from another person eating meals?: Total Help from another person taking care of personal grooming?: Total Help from another person toileting, which includes using toliet, bedpan, or urinal?: Total Help from another person bathing (including washing, rinsing, drying)?: Total Help from another person to put on and taking off regular upper body clothing?: Total Help from another person to put on and taking off regular lower body clothing?: Total 6 Click  Score: 6    End of Session Equipment Utilized During Treatment: Gait belt  OT Visit Diagnosis: Unsteadiness on feet (R26.81);Muscle weakness (generalized) (M62.81);Hemiplegia and hemiparesis Hemiplegia - Right/Left: Left Hemiplegia - dominant/non-dominant: Dominant Hemiplegia - caused by: Nontraumatic SAH   Activity Tolerance Patient tolerated treatment well;Patient limited by fatigue   Patient Left in bed;with call bell/phone within reach;with bed alarm set;with family/visitor present;with restraints reapplied   Nurse Communication Mobility status;Precautions        Time: 5732-2025 OT Time Calculation (min): 18 min  Charges: OT General Charges $OT Visit: 1 Visit OT Treatments $Therapeutic Activity: 8-22 mins   Brynn, OTR/L  Acute Rehabilitation Services Pager: [phone] Office: [phone] .    Jeri Modena 09/29/2020, 2:19 PM

## 2020-09-29 NOTE — Progress Notes (Signed)
Physical Therapy Treatment Patient Details Name: [name] MRN: [id number] DOB: [birth year] Today's Date: 09/29/2020    History of Present Illness 75 yo male presented 4/20 with memory loss and dysarthria. 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 Evacuation of SDH 4/27. bone flap in place Intubated 4/26-5/2.  PMH CAD depression kidney stones prostate CA HTN Macular degeneration OSA on CPAP, Afib DM2 HOH with hearing aides CABG CAD    PT Comments    Treated pt in conjunction with OT as pt unlikely able to tolerate separate sessions and to maximize quality and safety of session. Pt verbalized this session with  "yes" and a head nod appropriately. Pt demonstrates simple command following using his R side. Pt does not facilitation muscle activation at this time with tactile cues for head alignment as he maintains it in a strong anterior and R flexion/rotation position. Requesting a soft collar to help with alignment to progress the patient. Pt could benefit from positioning toward L during the day to help with passive stretch of cervical against gravity to the L. Focused on facilitating muscle activation and midline, upright orientation in sitting and standing, but pt needing extensive assistance of up to mod-maxA for sitting balance and TAx2 for standing due to weakness, decreased muscle activation, and tendency to push into a posterior lean with his R UE. Will continue to follow acutely. Current recommendations remain appropriate.   Follow Up Recommendations  SNF;Supervision/Assistance - 24 hour     Equipment Recommendations  Wheelchair (measurements PT);Wheelchair cushion (measurements PT);Hospital bed;Other (comment) (lift)    Recommendations for Other Services       Precautions / Restrictions Precautions Precautions: Fall Precaution Comments: hearing aides, top/bottom dentures, cortrak, mitten R hand, L  neglect, R pushing demonstrated, L shoulder subluxation Required Braces or Orthoses: Other Brace Other Brace: resting hand splint Restrictions Weight Bearing Restrictions: No    Mobility  Bed Mobility Overal bed mobility: Needs Assistance Bed Mobility: Rolling;Supine to Sit Rolling: Max assist;+2 for physical assistance Sidelying to sit: Max assist;+2 for physical assistance       General bed mobility comments: pt reaching and turning head toward R to demonstrate initiation of bed mobility. pt requires (A) to elevate trunk. pt release grab bar on commad. pt static sitting mod to max (A) pt needs cues and hand over hand to place R ue to prevent pushing. pt developing a strong pushing response to upright posture    Transfers Overall transfer level: Needs assistance Equipment used: 2 person hand held assist Transfers: Sit to/from Stand Sit to Stand: +2 physical assistance;Total assist         General transfer comment: pt with pad used at hips to help anterior shift pt into standing. pt requries R UE removed from stedy to prevent pushing backward. pt static sitting in stedy . pt sit<>Stand this session x4. BP monitored and stable  Ambulation/Gait             General Gait Details: Unable   Stairs             Wheelchair Mobility    Modified Rankin (Stroke Patients Only) Modified Rankin (Stroke Patients Only) Pre-Morbid Rankin Score: No symptoms Modified Rankin: Severe disability     Balance Overall balance assessment: Needs assistance Sitting-balance support: Single extremity supported;Feet supported Sitting balance-Leahy Scale: Poor Sitting balance - Comments: pt static sitting mod to max (A) pt needs cues and hand over hand to  place R ue to prevent pushing. pt developing a strong pushing response to upright posture Postural control: Posterior lean Standing balance support: Single extremity supported Standing balance-Leahy Scale: Zero Standing balance  comment: TAx2 with pt pushing with R UE into posterior lean.                            Cognition Arousal/Alertness: Awake/alert Behavior During Therapy: Flat affect Overall Cognitive Status: Impaired/Different from baseline Area of Impairment: Following commands;Attention;Safety/judgement;Awareness                   Current Attention Level: Selective   Following Commands: Follows one step commands inconsistently;Follows one step commands with increased time Safety/Judgement: Decreased awareness of safety;Decreased awareness of deficits Awareness: Intellectual   General Comments: pt verbalized Yes with a head nod in response to "do you want to sit up" pt very clear and verbalized. Pt following simple commands on R side with extra time.      Exercises      General Comments General comments (skin integrity, edema, etc.): noted to have a suture in R skull and RN shown the location during session. pt with strong R neck rotation and requesting a soft collar for alignment during sessions only to help with progression      Pertinent Vitals/Pain Pain Assessment: No/denies pain Pain Intervention(s): Monitored during session    Home Living                      Prior Function            PT Goals (current goals can now be found in the care plan section) Acute Rehab PT Goals Patient Stated Goal: states "yes " for oob today PT Goal Formulation: With patient Time For Goal Achievement: 09/29/2020 Potential to Achieve Goals: Fair Progress towards PT goals: Progressing toward goals (slowly)    Frequency    Min 3X/week      PT Plan Current plan remains appropriate    Co-evaluation PT/OT/SLP Co-Evaluation/Treatment: Yes Reason for Co-Treatment: Complexity of the patient's impairments (multi-system involvement);Necessary to address cognition/behavior during functional activity;For patient/therapist safety;To address functional/ADL transfers PT goals  addressed during session: Mobility/safety with mobility;Proper use of DME;Balance OT goals addressed during session: ADL's and self-care;Proper use of Adaptive equipment and DME;Strengthening/ROM      AM-PAC PT "6 Clicks" Mobility   Outcome Measure  Help needed turning from your back to your side while in a flat bed without using bedrails?: Total Help needed moving from lying on your back to sitting on the side of a flat bed without using bedrails?: Total Help needed moving to and from a bed to a chair (including a wheelchair)?: Total Help needed standing up from a chair using your arms (e.g., wheelchair or bedside chair)?: Total Help needed to walk in hospital room?: Total Help needed climbing 3-5 steps with a railing? : Total 6 Click Score: 6    End of Session Equipment Utilized During Treatment: Gait belt Activity Tolerance: Patient limited by fatigue Patient left: in bed;with call bell/phone within reach;with bed alarm set;with nursing/sitter in room Nurse Communication: Mobility status;Other (comment) (suture in skull; soft collar for positioning for progress) PT Visit Diagnosis: Other abnormalities of gait and mobility (R26.89);Hemiplegia and hemiparesis;Muscle weakness (generalized) (M62.81);Difficulty in walking, not elsewhere classified (R26.2);Other symptoms and signs involving the nervous system (R29.898);Unsteadiness on feet (R26.81) Hemiplegia - Right/Left: Left Hemiplegia - dominant/non-dominant: Dominant Hemiplegia - caused by:  Nontraumatic intracerebral hemorrhage     Time: 1201-1224 PT Time Calculation (min) (ACUTE ONLY): 23 min  Charges:  $Neuromuscular Re-education: 8-22 mins                     Moishe Spice, PT, DPT Acute Rehabilitation Services  Pager: [phone] Office: [place] 09/29/2020, 2:54 PM

## 2020-09-30 ENCOUNTER — Ambulatory Visit: Payer: Medicare Other | Admitting: Radiation Oncology

## 2020-09-30 ENCOUNTER — Ambulatory Visit: Payer: Medicare Other

## 2020-09-30 DIAGNOSIS — Z7189 Other specified counseling: Secondary | ICD-10-CM | POA: Diagnosis not present

## 2020-09-30 DIAGNOSIS — Z66 Do not resuscitate: Secondary | ICD-10-CM

## 2020-09-30 DIAGNOSIS — C712 Malignant neoplasm of temporal lobe: Secondary | ICD-10-CM | POA: Diagnosis not present

## 2020-09-30 DIAGNOSIS — Z515 Encounter for palliative care: Secondary | ICD-10-CM | POA: Diagnosis not present

## 2020-09-30 LAB — GLUCOSE, CAPILLARY
Glucose-Capillary: 113 mg/dL — ABNORMAL HIGH (ref 70–99)
Glucose-Capillary: 125 mg/dL — ABNORMAL HIGH (ref 70–99)
Glucose-Capillary: 158 mg/dL — ABNORMAL HIGH (ref 70–99)
Glucose-Capillary: 210 mg/dL — ABNORMAL HIGH (ref 70–99)
Glucose-Capillary: 212 mg/dL — ABNORMAL HIGH (ref 70–99)
Glucose-Capillary: 239 mg/dL — ABNORMAL HIGH (ref 70–99)

## 2020-09-30 NOTE — TOC Progression Note (Signed)
Transition of Care (TOC) - Progression Note  Marvetta Gibbons RN,BSN Transitions of Care Unit 4NP (non trauma) - RN Case Manager See Treatment Team for direct Phone #   Patient Details  Name: [name] MRN: [id number] Date of Birth: Nov 26, 1945  Transition of Care [facility]) CM/SW Contact  Dahlia Client, Romeo Rabon, RN Phone Number: 09/30/2020, 3:40 PM  Clinical Narrative:    Referral received from [facility] for home hosp[facility] needs, wife would like pt to go home with Cortrak and TF, [facility] has been contacted regarding Cortrak and TF at home under Hospice.  Call made to [place] with [place] regarding Home Hospice referral and to further clarify home TF with Cortrak.  Pt would need to be reviewed by Hospice and meet eligibility in order to move forward with plan to return home with wife under Hospice services and continued TF with Cortrak in place.   [place] to f/u with wife and review with Hospice MD- Hospice referral pending approval.   Pt will need DME ordered and delivered prior to discharge as well.   TOC to continue to follow for transition needs.    Expected Discharge Plan: [place] Barriers to Discharge: Continued Medical Work up  Expected Discharge Plan and Services Expected Discharge Plan: [place]   Discharge Planning Services: CM Consult Post Acute Care Choice: Hospice Living arrangements for the past 2 months: Single Family Home                 DME Arranged: Hospice Equipment Package Others DME Agency: [employer]     Representative spoke with at DME Agency: Per [facility] HH Arranged: Disease Management (Hospice) [place]: Hospice and [place] Date [place]: 09/30/20 Time [place]: 77 Representative spoke with at [place]: [place] ([place]) Interventions    Readmission Risk Interventions No flowsheet data found.

## 2020-09-30 NOTE — Plan of Care (Signed)

## 2020-09-30 NOTE — Progress Notes (Signed)
Palliative:  HPI:75 y.o.malewith past medical history of three vessel CAD, prostate CA, OSA on CPAP, AF on apixaban, and HTNadmitted on 4/20/2022with altered mental status.MRI 4/1revealedmass lesion in the right superior temporal gyrus with surrounding vasogenic edema and mass effect. The patiet was referred to neurosurgery and ultimately scheduled for surgery. He was admitted to [facility] 4/20 and taken to the OR under Dr. Christella Noa for craniotomy for tumor resection. The procedure was complicated by intraparenchymal hemorrhage. He continued to have progressive decline of his neurologic status and ultimatly required intubation in the setting of poor airway protection on 4/26. He was extubated 5/2. Mental status continues to wax and wane. Dependent on tube feeds for nourishment. PMT consulted to discuss [place].  Met with patient's wife [name]. She shares she has questions regarding plan moving forward and would like to meet privately. We quickly reviewed options -  SNF with PEG, hospice facility with comfort care (no artificial feeding), home with home health, home with hospice and continued or not continued artificial feeding. [name] has spend time thinking about these options and has decided she would like to take Lindsay home and she would like the support of hospice. We discuss philosophy of hospice care - focusing on comfort and quality of life, allowing natural disease process to occur. [name] expresses understanding. We discussed typical amount of support provided - [name] understands she will be providing majority of Payson's care and she feels she is able. She shares she would like to continue tube feeding through cortrak (confirmed [facility] hospice will do this). She would also like to continue his insulin and blood sugar checks as she worries about how high his blood sugars have been on tube feeding. She shares she will need a hospital bed and supplies for tube feeding. She also shares she will need at least a day or  two to get their home ready for O[place] to return to it. She plans to setup his bed in the living room where he can see the TV, artwork he collects, and look out the window. She also hopes for friends and family to visit him. Participated in life review as [name] recalls what a wonderful man Kiril is.  All questions/concerns addressed. Emotional support provided.   Exam: Does not respond to voice or touch. No distress. Cortrak in place.  Plan: - dc home with hospice support - wife needs a day or two to get setup at home for [facility] return  - discussed plan with TOC and hospice liaison  - continue cortrak and tube feeding at dc, continue CBG checks and insulin per wife's request - patient appears comfortable, no symptom management needs at this time - dc some interventions not needed to promote comfort, lift visitor restrictions as we move into comfort focused care  40 minutes  [place], DNP, [facility] Palliative Medicine Team Team Phone # [phone]  Pager # [phone]  Greater than 50%  of this time was spent counseling and coordinating care related to the above assessment and plan.

## 2020-09-30 NOTE — Progress Notes (Signed)
Orthopedic Tech Progress Note Patient Details:  [name] [birth year] [id number]  Ortho Devices Type of Ortho Device: Soft collar Ortho Device/Splint Interventions: Ordered,Application,Adjustment   Post Interventions Patient Tolerated: Well Instructions Provided: Adjustment of device,Care of device,Poper ambulation with device   [name] P [name] 09/30/2020, 1:49 PM

## 2020-09-30 NOTE — Progress Notes (Signed)
Physical Therapy Treatment & Discharge Patient Details Name: [name] MRN: [id number] DOB: 05-20-1945 Today's Date: 09/30/2020    History of Present Illness 75 yo male presented 4/20 with memory loss and dysarthria. 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 Evacuation of SDH 4/27. bone flap in place Intubated 4/26-5/2.  PMH CAD depression kidney stones prostate CA HTN Macular degeneration OSA on CPAP, Afib DM2 HOH with hearing aides CABG CAD    PT Comments    Of note, towards end of session pt's wife reported they had decided to go comfort measures and take pt home in next few days. Thus, PT will sign off at this time. Focused this session on decreasing pushing in sitting, with noted success and improved upright posture and balance when R hand/elbow was in lap. Also, focused on trying to activate R lower extremity with sit <> stand transfers, but no initiation or success noted. Pt limited this session by lethargy, and thus not following as many commands as during prior session. Pt's wife reports he was very active and stating "yes" and "no" a lot this morning prior to session, which may have caused his lethargy. Educated pt's wife extensively on positioning of pt, usage of splints/collars, communication methods with pt, and providing gentle passive stretches to pt's neck in preparation for d/c home.    Follow Up Recommendations  SNF;Supervision/Assistance - 24 hour     Equipment Recommendations  Wheelchair (measurements PT);Wheelchair cushion (measurements PT);Hospital bed;Other (comment) (lift)    Recommendations for Other Services       Precautions / Restrictions Precautions Precautions: Fall Precaution Comments: hearing aides, top/bottom dentures, cortrak, mitten R hand, L neglect, R pushing demonstrated, L shoulder subluxation Required Braces or Orthoses: Other Brace Other Brace: resting hand splint;  requesting soft collar for improved head position and progress Restrictions Weight Bearing Restrictions: No    Mobility  Bed Mobility Overal bed mobility: Needs Assistance Bed Mobility: Supine to Sit;Sit to Supine     Supine to sit: Total assist;+2 for physical assistance Sit to supine: Total assist;+2 for physical assistance   General bed mobility comments: Pt lethargic and closing eyes in supine position majority of time, limiting ability to follow commands to assist with bed mobility, TAx2 to complete.    Transfers Overall transfer level: Needs assistance Equipment used: 2 person hand held assist Transfers: Sit to/from Stand Sit to Stand: +2 physical assistance;Total assist         General transfer comment: Attempted x5 reps of mini- sit to stand transfers from EOB with therapist anterior to pt blocking bil knees and cuing pt to hug therapist to see if pt would activate to assist, no activation noted and no clearance of buttocks.  Ambulation/Gait             General Gait Details: Unable   Stairs             Wheelchair Mobility    Modified Rankin (Stroke Patients Only) Modified Rankin (Stroke Patients Only) Pre-Morbid Rankin Score: No symptoms Modified Rankin: Severe disability     Balance Overall balance assessment: Needs assistance Sitting-balance support: Single extremity supported;Feet supported Sitting balance-Leahy Scale: Poor Sitting balance - Comments: Pt pushing with R hand towards L and posteriorly, needing up to MaxA at times and as little as minA when R hand/elbow on lap. Postural control: Posterior lean;Left lateral lean  Cognition Arousal/Alertness: Lethargic Behavior During Therapy: Flat affect Overall Cognitive Status: Impaired/Different from baseline Area of Impairment: Following commands;Attention;Safety/judgement;Awareness                   Current Attention Level:  Selective   Following Commands: Follows one step commands inconsistently;Follows one step commands with increased time Safety/Judgement: Decreased awareness of safety;Decreased awareness of deficits Awareness: Intellectual   General Comments: pt verbalized Yes and No a couple times very softly during session. Pt following intermittent, inconsistent simple commands on R side with extra time. Pt much more lethargic this date, with wife stating pt was much more alert and conversant stating yes and no earlier this AM.      Exercises Other Exercises Other Exercises: PROM cervical rotation and lateral sidebending to L. x5    General Comments General comments (skin integrity, edema, etc.): Positioned pt rolled slightly to L to allow gravity to assist with more neutral head positioning, success; Educated pt's wife on providing passive stretches to neck into L lateral flexion and rotation, cuing for proper and safe hand placement and pressure, demonstrating understanding; Educated pt's wife on proper donning and usage of L hand splint and proper usage of soft cervical collar once it arrives; Educated pt's wife on regular re-positioning and floating of heels to prevent pressure ulcers; Educated pt's wife on how to communicate and provide pt with ample time to respond; Called Ortho Tech to deliver soft cervical collar, they confirmed they would      Pertinent Vitals/Pain Pain Assessment: Faces Faces Pain Scale: Hurts a little bit Pain Location: grimacing with cervical extension and rotation Pain Descriptors / Indicators: Grimacing Pain Intervention(s): Monitored during session;Limited activity within patient's tolerance;Repositioned    Home Living                      Prior Function            PT Goals (current goals can now be found in the care plan section) Acute Rehab PT Goals Patient Stated Goal: Pt gestured to OOB PT Goal Formulation: With patient Time For Goal Achievement:  10/09/2020 Potential to Achieve Goals: Fair Progress towards PT goals: Not progressing toward goals - comment (limited by lethargy this session)    Frequency    Min 3X/week      PT Plan Current plan remains appropriate    Co-evaluation              AM-PAC PT "6 Clicks" Mobility   Outcome Measure  Help needed turning from your back to your side while in a flat bed without using bedrails?: Total Help needed moving from lying on your back to sitting on the side of a flat bed without using bedrails?: Total Help needed moving to and from a bed to a chair (including a wheelchair)?: Total Help needed standing up from a chair using your arms (e.g., wheelchair or bedside chair)?: Total Help needed to walk in hospital room?: Total Help needed climbing 3-5 steps with a railing? : Total 6 Click Score: 6    End of Session Equipment Utilized During Treatment: Gait belt Activity Tolerance: Patient limited by fatigue;Patient limited by lethargy Patient left: in bed;with call bell/phone within reach;with bed alarm set;with family/visitor present Nurse Communication: Mobility status;Other (comment) (soft collar for positioning for progress) PT Visit Diagnosis: Other abnormalities of gait and mobility (R26.89);Hemiplegia and hemiparesis;Muscle weakness (generalized) (M62.81);Difficulty in walking, not elsewhere classified (R26.2);Other symptoms and signs involving the nervous system (R29.898);Unsteadiness  on feet (R26.81) Hemiplegia - Right/Left: Left Hemiplegia - dominant/non-dominant: Dominant Hemiplegia - caused by: Nontraumatic intracerebral hemorrhage     Time: 1136-1213 PT Time Calculation (min) (ACUTE ONLY): 37 min  Charges:  $Therapeutic Activity: 8-22 mins $Neuromuscular Re-education: 8-22 mins                     Moishe Spice, PT, DPT Acute Rehabilitation Services  Pager: [phone] Office: [place] 09/30/2020, 12:38 PM

## 2020-09-30 NOTE — Progress Notes (Addendum)
[facility]) Hospital Liaison Note  Addendum:  Pt has been deemed appropriate for hospice services at home.  Received referral from [facility] for family interest in hospice at home.  Chart under review at this time by [facility] MD; hospice eligibility pending.  Met at bedside with pt and spouse, [name]; spoke privately with [name] to initiate education on hospice services at home and to clarify goals of care on hospice.  Pat verbalized understanding.  Discussion had about need to hear from [facility] MD regarding eligibility.  Plan made for [facility] liaison to reach out to [facility] tomorrow.  Pat requested time to get the house ready to receive the following DME:  Hospital bed, Overbed table, and pump for tube feeds.  Per discussion she would be able to accept delivery of DME tomorrow after 12pm.  DME will be ordered for that time.  Contact numbers for [facility] given to [facility]  TOC made aware.  Thank you for the opportunity to participate in this patient's care.  Domenic Moras, BSN, RN [facility] Liaison [phone] (24h on call)

## 2020-09-30 NOTE — Progress Notes (Signed)
Patient ID: [name], male   DOB: 07/24/1945, 75 y.o.   MRN: [id number] Opens eyes No neurological changes  Not following commands [name] would like to take him home for hospice care.

## 2020-10-01 DIAGNOSIS — Z66 Do not resuscitate: Secondary | ICD-10-CM | POA: Diagnosis not present

## 2020-10-01 DIAGNOSIS — Z515 Encounter for palliative care: Secondary | ICD-10-CM | POA: Diagnosis not present

## 2020-10-01 DIAGNOSIS — Z7189 Other specified counseling: Secondary | ICD-10-CM | POA: Diagnosis not present

## 2020-10-01 DIAGNOSIS — C712 Malignant neoplasm of temporal lobe: Secondary | ICD-10-CM | POA: Diagnosis not present

## 2020-10-01 LAB — GLUCOSE, CAPILLARY
Glucose-Capillary: 118 mg/dL — ABNORMAL HIGH (ref 70–99)
Glucose-Capillary: 147 mg/dL — ABNORMAL HIGH (ref 70–99)
Glucose-Capillary: 148 mg/dL — ABNORMAL HIGH (ref 70–99)
Glucose-Capillary: 164 mg/dL — ABNORMAL HIGH (ref 70–99)
Glucose-Capillary: 171 mg/dL — ABNORMAL HIGH (ref 70–99)
Glucose-Capillary: 259 mg/dL — ABNORMAL HIGH (ref 70–99)
Glucose-Capillary: 80 mg/dL (ref 70–99)

## 2020-10-01 MED ORDER — HALOPERIDOL LACTATE 5 MG/ML IJ SOLN: 2.0000 mg | Freq: Four times a day (QID) | INTRAMUSCULAR | Status: DC | PRN

## 2020-10-01 MED ORDER — LORAZEPAM 2 MG/ML IJ SOLN: 1.0000 mg | INTRAMUSCULAR | Status: DC | PRN

## 2020-10-01 NOTE — Plan of Care (Signed)

## 2020-10-01 NOTE — Progress Notes (Addendum)
[facility] ([facility])  DME ordered this am to include: hospital bed, tube feeding supplies, over bed table. This should be delivered by the end of Friday.  Spoke with wife [name], she is anxious about bringing her husband home to care for. She has many questions about supplies and his actual dc date. Advised her to discuss with hospital team regarding his dc date.  As of now, we are holding a hospice RN admission for 5/21 @ 630 pm.  Please arrange for any comfort meds that may be needed so there is no lapse in his symptoms prior to hospice arriving.  Thank you, Venia Carbon RN, BSN, [place] [facility] Liaison   **addendum 5/20 @ 230 pm, spoke with [name], DME is on the way to being delivered. Discussed tentative plans for dc tomorrow and the plan for [facility] RN to be at the home around 630 pm tomorrow. Provided reinforcement and support.

## 2020-10-01 NOTE — Progress Notes (Signed)
Palliative:  HPI:74 y.o.malewith past medical history of three vessel CAD, prostate CA, OSA on CPAP, AF on apixaban, and HTNadmitted on 4/20/2022with altered mental status.MRI 4/1revealedmass lesion in the right superior temporal gyrus with surrounding vasogenic edema and mass effect. The patiet was referred to neurosurgery and ultimately scheduled for surgery. He was admitted to [facility] 4/20 and taken to the OR under Dr. Christella Noa for craniotomy for tumor resection. The procedure was complicated by intraparenchymal hemorrhage. He continued to have progressive decline of his neurologic status and ultimatly required intubation in the setting of poor airway protection on 4/26. He was extubated 5/2. Mental status continues to wax and wane. Dependent on tube feeds for nourishment. PMT consulted to discuss Sylvarena.  Met with patient's wife [name]. [name] is anxious about setting up discharge home. She has spoken to hospice this morning regarding plans to discharge. Equipment to be delivered later today.   Reginal is slightly agitated this morning - PRN medications added.  Darrian seems frustrated by monitors - these were all dc'd - Pat agreed.   All questions/concerns addressed. Emotional support provided.   Exam: Appears agitated. Does not follow commands. Breathing unlabored.   Plan: - dc home with hospice support - wife hopeful for tomorrow - equipment delivered tonight - discussed plan with TOC and hospice liaison  - continue cortrak and tube feeding at dc, continue CBG checks and insulin per wife's request - patient appears agitated - prn haldol and ativan added - dc some interventions not needed to promote comfort, lift visitor restrictions as we move into comfort focused care  40 minutes  [place], DNP, [facility] Palliative Medicine Team Team Phone # [phone]  Pager # [phone]  Greater than 50%  of this time was spent counseling and coordinating care related to the above  assessment and plan.

## 2020-10-02 LAB — GLUCOSE, CAPILLARY
Glucose-Capillary: 190 mg/dL — ABNORMAL HIGH (ref 70–99)
Glucose-Capillary: 182 mg/dL — ABNORMAL HIGH (ref 70–99)
Glucose-Capillary: 182 mg/dL — ABNORMAL HIGH (ref 70–99)
Glucose-Capillary: 227 mg/dL — ABNORMAL HIGH (ref 70–99)
Glucose-Capillary: 248 mg/dL — ABNORMAL HIGH (ref 70–99)

## 2020-10-02 MED ORDER — LEVETIRACETAM 100 MG/ML PO SOLN: 500.0000 mg | Freq: Two times a day (BID) | ORAL | 12 refills | Status: AC

## 2020-10-02 MED ORDER — LORAZEPAM 2 MG/ML IJ SOLN: 1.0000 mg | INTRAMUSCULAR | 0 refills | Status: AC | PRN

## 2020-10-02 MED ORDER — HALOPERIDOL LACTATE 5 MG/ML IJ SOLN: 2.0000 mg | Freq: Four times a day (QID) | INTRAMUSCULAR | Status: AC | PRN

## 2020-10-02 MED ORDER — ONDANSETRON HCL 4 MG/2ML IJ SOLN: 4.0000 mg | INTRAMUSCULAR | 0 refills | Status: AC | PRN

## 2020-10-02 MED ORDER — CHLORHEXIDINE GLUCONATE 0.12 % MT SOLN: 15.0000 mL | Freq: Two times a day (BID) | OROMUCOSAL | 0 refills | Status: AC

## 2020-10-02 MED ORDER — ACETAMINOPHEN 160 MG/5ML PO SOLN: 650.0000 mg | Freq: Four times a day (QID) | ORAL | 0 refills | Status: AC | PRN

## 2020-10-02 NOTE — Progress Notes (Signed)
Neurosurgery  Patient unresponsive but appears comfortable in bed today.  Plan for d/c to hospice this evening.

## 2020-10-02 NOTE — Progress Notes (Signed)
Manufacturing engineer [facility])     This patient has been referred for hospice services at home after discharge and has been deemed eligible for hospice by [facility] MD.  DME is confirmed to have been delivered and is in place.  Spoke with pt's wife [name] who agrees with plan to discharge home today with an admission visit for hospice set up for 630 pm today.  Shared this information with care team via secure chat.  Pat questioned if tube feed formula would be sent home from the hospital with the pt.  Ellard Artis, RN TOC, made aware of this need.  [facility] will order this once pt is on service but will need several days' supply.  [facility] will continue to follow for any discharge planning needs and to coordinate admission onto hospice care.    Thank you for the opportunity to participate in this patient's care.     Domenic Moras, BSN, RN [facility] Liaison [phone] (24h on call)

## 2020-10-02 NOTE — Progress Notes (Signed)
Patient discharged to home with hospice via stretcher with transport.  No changes noted in patient assessment.  NAD.  Sent with IV intact due to needing IV meds with hospice.  Also sent with cortrak for tube feeding as ordered.  Wife notified that patient has been picked up.

## 2020-10-02 NOTE — TOC Transition Note (Signed)
Transition of Care [facility]) - CM/SW Discharge Note   Patient Details  Name: [name] MRN: [id number] Date of Birth: May 25, 1945  Transition of Care [facility]) CM/SW Contact:  Bartholomew Crews, RN Phone Number: [phone] 10/02/2020, 2:08 PM   Clinical Narrative:     Patient has orders to transition home. Spoke with spouse, [name], on the phone to confirm home address. PTAR arranged. [facility] notified of PTAR arrangements. Medical transport paperwork completed.   Final next level of care: Home w Hospice Care Barriers to Discharge: No Barriers Identified   Patient Goals and CMS Choice Patient states their goals for this hospitalization and ongoing recovery are:: return home w/ hospice CMS Medicare.gov Compare Post Acute Care list provided to:: Patient Represenative (must comment) Kathrynn Humble) Choice offered to / list presented to : Spouse  Discharge Placement                       Discharge Plan and Services   Discharge Planning Services: CM Consult Post Acute Care Choice: Hospice          DME Arranged: Hospice Equipment Package Others DME Agency: [employer]     Representative spoke with at DME Agency: Per [facility] HH Arranged: Disease Management (Hospice) [place]: Hospice and [place] Date [place]: 09/30/20 Time [place]: 1230 Representative spoke with at [place]: [place] ([place]) Interventions     Readmission Risk Interventions No flowsheet data found.

## 2020-10-02 NOTE — TOC Transition Note (Signed)
Transition of Care [facility]) - CM/SW Discharge Note   Patient Details  Name: [name] MRN: [id number] Date of Birth: 03-Sep-1945  Transition of Care [facility]) CM/SW Contact:  Bartholomew Crews, RN Phone Number: [phone] 10/02/2020, 5:20 PM   Clinical Narrative:     Received call from patient's spouse asking about transport. Spoke with EMS who advised that patient was next for pick up at [facility], however, there were a total of 10-12 on list awaiting transport. EMS could not verify if he was actually next for pick up or where he was on the list. Spoke with liaison at [facility], admission RN to visit home at 7pm and will stay until patient arrives and can be settled.   Final next level of care: Home w Hospice Care Barriers to Discharge: No Barriers Identified   Patient Goals and CMS Choice Patient states their goals for this hospitalization and ongoing recovery are:: return home w/ hospice CMS Medicare.gov Compare Post Acute Care list provided to:: Patient Represenative (must comment) Kathrynn Humble) Choice offered to / list presented to : Spouse  Discharge Placement                       Discharge Plan and Services   Discharge Planning Services: CM Consult Post Acute Care Choice: Hospice          DME Arranged: Hospice Equipment Package Others DME Agency: [employer]     Representative spoke with at DME Agency: Per [facility] HH Arranged: Disease Management (Hospice) [place]: Hospice and [place] Date [place]: 09/30/20 Time [place]: 1230 Representative spoke with at [place]: [place] ([place]) Interventions     Readmission Risk Interventions No flowsheet data found.

## 2020-10-02 NOTE — Discharge Summary (Signed)
Discharge Summary  Date of Admission: 09/01/2020  Date of Discharge: 10/02/20  Attending Physician: Ashok Pall, MD  Hospital Course: Patient was admitted following resection of a right sided brain tumor on 09/01/20. He did not have improvement and was taken back to the OR on 09/07/20 for hematoma evacuation.  Post-operatively, he had new left sided weakness, CTH showed an ICH deep to the resection cavity. He remained intubated after the hematoma evacuation, hospital course was notable for an AKI that resolved without issue, Afib w/ RVR that was controlled medically, likely aspiration pneumonia treated with antibiotics. His final path was GBM, IDH WT. He was extubated on 5/2 but with his contralateral weakness and aphasia, he had a low overall performance status with a HGG. His family opted to pursue discharge home with home hospice and he was discharged home with home hospice on 10/02/20.   Neurologic exam at discharge:  Unresponsive, not following commands  Discharge diagnosis: Glioblastoma multiforme  Judith Part, MD 10/02/20 1:26 PM

## 2020-10-04 ENCOUNTER — Telehealth: Payer: Self-pay | Admitting: *Deleted

## 2020-10-04 NOTE — Telephone Encounter (Signed)
Called [name] wife of this patient to cancel appts. per Shona Simpson' request, spoke with patient's wife [name] and she is aware these appts. Have been cancelled

## 2020-10-13 DEATH — deceased

## 2020-10-14 ENCOUNTER — Ambulatory Visit: Payer: Medicare Other | Admitting: Radiation Oncology

## 2020-10-14 ENCOUNTER — Ambulatory Visit: Payer: Medicare Other

## 2020-10-15 ENCOUNTER — Ambulatory Visit: Payer: Medicare Other | Admitting: Radiation Oncology

## 2020-10-18 ENCOUNTER — Ambulatory Visit: Payer: Medicare Other | Admitting: Radiation Oncology

## 2020-10-18 ENCOUNTER — Ambulatory Visit: Payer: Medicare Other

## 2020-10-20 ENCOUNTER — Ambulatory Visit: Payer: Medicare Other | Admitting: Physician Assistant

## 2020-10-22 ENCOUNTER — Ambulatory Visit: Payer: Medicare Other | Admitting: Radiation Oncology
